# Patient Record
Sex: Male | Born: 1960 | Race: Black or African American | Hispanic: No | Marital: Married | Smoking: Never smoker
Health system: Southern US, Community
[De-identification: ages and names within clinical notes are randomized; demographics above are authoritative.]

## PROBLEM LIST (undated history)

## (undated) DIAGNOSIS — J45909 Unspecified asthma, uncomplicated: Secondary | ICD-10-CM

## (undated) DIAGNOSIS — J189 Pneumonia, unspecified organism: Secondary | ICD-10-CM

## (undated) DIAGNOSIS — Z87442 Personal history of urinary calculi: Secondary | ICD-10-CM

## (undated) DIAGNOSIS — IMO0001 Reserved for inherently not codable concepts without codable children: Secondary | ICD-10-CM

## (undated) DIAGNOSIS — Z974 Presence of external hearing-aid: Secondary | ICD-10-CM

## (undated) DIAGNOSIS — R683 Clubbing of fingers: Secondary | ICD-10-CM

## (undated) DIAGNOSIS — R21 Rash and other nonspecific skin eruption: Secondary | ICD-10-CM

## (undated) DIAGNOSIS — R030 Elevated blood-pressure reading, without diagnosis of hypertension: Secondary | ICD-10-CM

## (undated) DIAGNOSIS — K219 Gastro-esophageal reflux disease without esophagitis: Secondary | ICD-10-CM

## (undated) DIAGNOSIS — M1990 Unspecified osteoarthritis, unspecified site: Secondary | ICD-10-CM

## (undated) DIAGNOSIS — H9192 Unspecified hearing loss, left ear: Secondary | ICD-10-CM

## (undated) DIAGNOSIS — Z5111 Encounter for antineoplastic chemotherapy: Secondary | ICD-10-CM

## (undated) DIAGNOSIS — E559 Vitamin D deficiency, unspecified: Secondary | ICD-10-CM

## (undated) DIAGNOSIS — I10 Essential (primary) hypertension: Secondary | ICD-10-CM

## (undated) DIAGNOSIS — C7989 Secondary malignant neoplasm of other specified sites: Secondary | ICD-10-CM

## (undated) DIAGNOSIS — D649 Anemia, unspecified: Secondary | ICD-10-CM

## (undated) DIAGNOSIS — E162 Hypoglycemia, unspecified: Secondary | ICD-10-CM

## (undated) HISTORY — DX: Vitamin D deficiency, unspecified: E55.9

## (undated) HISTORY — DX: Unspecified hearing loss, left ear: H91.92

## (undated) HISTORY — PX: TONSILLECTOMY: SUR1361

## (undated) HISTORY — DX: Gastro-esophageal reflux disease without esophagitis: K21.9

---
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
## 2006-11-24 ENCOUNTER — Emergency Department (HOSPITAL_COMMUNITY): Admission: EM | Admit: 2006-11-24 | Discharge: 2006-11-24 | Payer: Self-pay | Admitting: Emergency Medicine

## 2011-04-03 LAB — POCT I-STAT CREATININE: Operator id: 270651

## 2011-04-03 LAB — I-STAT 8, (EC8 V) (CONVERTED LAB)
Acid-Base Excess: 1
Bicarbonate: 24.4 — ABNORMAL HIGH
Chloride: 107
Operator id: 270651
pCO2, Ven: 34.2 — ABNORMAL LOW
pH, Ven: 7.461 — ABNORMAL HIGH

## 2011-04-03 LAB — CULTURE, BLOOD (ROUTINE X 2)
Culture: NO GROWTH
Culture: NO GROWTH

## 2011-04-03 LAB — URINALYSIS, ROUTINE W REFLEX MICROSCOPIC
Glucose, UA: NEGATIVE
Hgb urine dipstick: NEGATIVE
Protein, ur: NEGATIVE
pH: 5.5

## 2011-04-03 LAB — CBC
Platelets: 116 — ABNORMAL LOW
RDW: 13.2
WBC: 10.1

## 2011-04-03 LAB — DIFFERENTIAL
Basophils Absolute: 0
Eosinophils Relative: 1
Lymphocytes Relative: 7 — ABNORMAL LOW
Lymphs Abs: 0.7
Neutro Abs: 8.7 — ABNORMAL HIGH
Neutrophils Relative %: 86 — ABNORMAL HIGH

## 2015-04-25 ENCOUNTER — Encounter: Payer: Self-pay | Admitting: Cardiology

## 2015-11-02 ENCOUNTER — Other Ambulatory Visit: Payer: Self-pay

## 2015-11-02 ENCOUNTER — Other Ambulatory Visit: Payer: Self-pay | Admitting: Physician Assistant

## 2015-11-02 DIAGNOSIS — R222 Localized swelling, mass and lump, trunk: Secondary | ICD-10-CM

## 2015-11-06 ENCOUNTER — Ambulatory Visit
Admission: RE | Admit: 2015-11-06 | Discharge: 2015-11-06 | Disposition: A | Discharge status: Home / Self Care | Payer: BLUE CROSS/BLUE SHIELD | Source: Ambulatory Visit | Attending: Physician Assistant | Admitting: Physician Assistant

## 2015-11-06 DIAGNOSIS — R222 Localized swelling, mass and lump, trunk: Secondary | ICD-10-CM

## 2015-11-06 MED ORDER — IOPAMIDOL (ISOVUE-300) INJECTION 61%
75.0000 mL | Freq: Once | INTRAVENOUS | Status: AC | PRN
  Administered 2015-11-06: 75 mL via INTRAVENOUS

## 2015-11-07 ENCOUNTER — Other Ambulatory Visit (HOSPITAL_COMMUNITY): Payer: Self-pay | Admitting: Internal Medicine

## 2015-11-07 DIAGNOSIS — R918 Other nonspecific abnormal finding of lung field: Secondary | ICD-10-CM

## 2015-11-14 ENCOUNTER — Other Ambulatory Visit: Payer: Self-pay | Admitting: General Surgery

## 2015-11-15 ENCOUNTER — Ambulatory Visit (HOSPITAL_COMMUNITY)
Admission: RE | Admit: 2015-11-15 | Discharge: 2015-11-15 | Disposition: A | Discharge status: Home / Self Care | Payer: BLUE CROSS/BLUE SHIELD | Source: Ambulatory Visit | Attending: Internal Medicine | Admitting: Internal Medicine

## 2015-11-15 ENCOUNTER — Encounter (HOSPITAL_COMMUNITY): Payer: Self-pay

## 2015-11-15 DIAGNOSIS — R918 Other nonspecific abnormal finding of lung field: Secondary | ICD-10-CM | POA: Diagnosis present

## 2015-11-15 DIAGNOSIS — J850 Gangrene and necrosis of lung: Secondary | ICD-10-CM | POA: Diagnosis not present

## 2015-11-15 DIAGNOSIS — C499 Malignant neoplasm of connective and soft tissue, unspecified: Secondary | ICD-10-CM

## 2015-11-15 DIAGNOSIS — Z9889 Other specified postprocedural states: Secondary | ICD-10-CM | POA: Diagnosis not present

## 2015-11-15 DIAGNOSIS — R683 Clubbing of fingers: Secondary | ICD-10-CM | POA: Insufficient documentation

## 2015-11-15 HISTORY — DX: Clubbing of fingers: R68.3

## 2015-11-15 HISTORY — DX: Malignant neoplasm of connective and soft tissue, unspecified: C49.9

## 2015-11-15 LAB — CBC
HCT: 39.3 % (ref 39.0–52.0)
Hemoglobin: 12.1 g/dL — ABNORMAL LOW (ref 13.0–17.0)
MCH: 28.1 pg (ref 26.0–34.0)
MCHC: 30.8 g/dL (ref 30.0–36.0)
MCV: 91.2 fL (ref 78.0–100.0)
PLATELETS: 223 10*3/uL (ref 150–400)
RBC: 4.31 MIL/uL (ref 4.22–5.81)
RDW: 14.4 % (ref 11.5–15.5)
WBC: 8.3 10*3/uL (ref 4.0–10.5)

## 2015-11-15 LAB — PROTIME-INR
INR: 1.27 (ref 0.00–1.49)
PROTHROMBIN TIME: 16 s — AB (ref 11.6–15.2)

## 2015-11-15 LAB — APTT: aPTT: 41 seconds — ABNORMAL HIGH (ref 24–37)

## 2015-11-15 MED ORDER — FENTANYL CITRATE (PF) 100 MCG/2ML IJ SOLN
INTRAMUSCULAR | Status: AC | PRN
  Administered 2015-11-15: 100 ug via INTRAVENOUS

## 2015-11-15 MED ORDER — MIDAZOLAM HCL 2 MG/2ML IJ SOLN
INTRAMUSCULAR | Status: AC | PRN
  Administered 2015-11-15: 2 mg via INTRAVENOUS

## 2015-11-15 MED ORDER — MIDAZOLAM HCL 2 MG/2ML IJ SOLN
INTRAMUSCULAR | Status: AC
  Filled 2015-11-15: qty 2

## 2015-11-15 MED ORDER — LIDOCAINE HCL 1 % IJ SOLN
INTRAMUSCULAR | Status: AC
  Filled 2015-11-15: qty 20

## 2015-11-15 MED ORDER — FENTANYL CITRATE (PF) 100 MCG/2ML IJ SOLN
INTRAMUSCULAR | Status: AC
  Filled 2015-11-15: qty 2

## 2015-11-15 MED ORDER — SODIUM CHLORIDE 0.9 % IV SOLN
INTRAVENOUS | Status: DC
  Administered 2015-11-15: 10:00:00 via INTRAVENOUS

## 2015-11-15 NOTE — Discharge Instructions (Signed)

## 2015-11-15 NOTE — Sedation Documentation (Signed)
Patient denies pain and is resting comfortably.  

## 2015-11-15 NOTE — Procedures (Signed)
CT guided core biopsies of right lung mass.  3 cores placed in formalin and 1 in saline.  Minimal bleeding.  No immediate complication.

## 2015-11-15 NOTE — H&P (Signed)
Chief Complaint: Patient was seen in consultation today for right lung mass at the request of Shelton,Kimberly  Referring Physician(s): Shelton,Kimberly  Supervising Physician: Dr Markus Daft  Patient Status: Out-pt  History of Present Illness: Mathew [name] is a 55 y.o. male   Pt seen by PMD for aches and pains in joints Sent to Rheumatologist Noted nail clubbing and sent for CXR Noted abnormal CXR--no imaging available to me CT Chest 11/06/15: IMPRESSION: 1. Large enhancing soft tissue pleural-based mass posteriorly in the right lower lobe involving the superior segment with extension into the mediastinum. This would be amenable to CT guided core needle biopsy. Associated small pleural effusion on the right. 2. Indeterminate 4 mm pulmonary nodule left lung base. 3. 17 mm right renal lesion likely a cyst. Consider renal ultrasound to confirm.  Now scheduled for right lung mass biopsy  Past Medical History  Diagnosis Date  . Clubbing of nails     History reviewed. No pertinent past surgical history.  Allergies: Review of patient's allergies indicates not on file.  Medications: Prior to Admission medications   Medication Sig Start Date End Date Taking? Authorizing Provider  meloxicam (MOBIC) 15 MG tablet Take 15 mg by mouth daily. 10/29/15   Historical Provider, MD     Family History  Problem Relation Age of Onset  . Diabetes type II Mother   . Hypertension Father     Social History   Social History  . Marital Status: Married    Spouse Name: N/A  . Number of Children: N/A  . Years of Education: N/A   Social History Main Topics  . Smoking status: Never Smoker   . Smokeless tobacco: None  . Alcohol Use: None  . Drug Use: None  . Sexual Activity: Not Asked   Other Topics Concern  . None   Social History Narrative  . None    Review of Systems: A 12 point ROS discussed and pertinent positives are indicated in the HPI above.  All other systems  are negative.  Review of Systems  Constitutional: Negative for fever, activity change, appetite change, fatigue and unexpected weight change.  Respiratory: Negative for cough and shortness of breath.   Cardiovascular: Negative for chest pain.  Gastrointestinal: Negative for abdominal pain.  Musculoskeletal: Negative for back pain.  Neurological: Negative for weakness.  Psychiatric/Behavioral: Negative for behavioral problems and confusion.    Vital Signs: BP 145/93 mmHg  Pulse 64  Temp(Src) 97.4 F (36.3 C)  Resp 18  Ht '5\' 8"'$  (1.727 m)  Wt 181 lb (82.101 kg)  BMI 27.53 kg/m2  SpO2 100%  Physical Exam  Constitutional: He is oriented to person, place, and time.  Cardiovascular: Normal rate, regular rhythm and normal heart sounds.   No murmur heard. Pulmonary/Chest: Effort normal and breath sounds normal. He has no wheezes.  Abdominal: Soft. He exhibits no distension.  Musculoskeletal: Normal range of motion.  Neurological: He is alert and oriented to person, place, and time.  Skin: Skin is warm and dry.  Nail clubbing identified  Psychiatric: He has a normal mood and affect. His behavior is normal. Judgment and thought content normal.  Nursing note and vitals reviewed.   Mallampati Score:  MD Evaluation Airway: WNL Heart: WNL Abdomen: WNL Chest/ Lungs: WNL ASA  Classification: 2 Mallampati/Airway Score: One  Imaging: Ct Chest W Contrast  11/06/2015  CLINICAL DATA:  Abnormal chest x-ray by report, not available for review, concern for mass EXAM: CT CHEST WITH CONTRAST TECHNIQUE:  Multidetector CT imaging of the chest was performed during intravenous contrast administration. CONTRAST:  49m ISOVUE-300 IOPAMIDOL (ISOVUE-300) INJECTION 61% COMPARISON:  11/24/2006 FINDINGS: On image number 112 in the lateral left lung base there is a 4 mm pulmonary There is a large pleural-based mass posteriorly in the mid to superior aspect of the right lower lobe. The mass shows  heterogeneous attenuation an faint heterogeneous enhancement with a focus of either more prominent vascular enhancement or calcification along its inferomedial border. New the mass is inseparable from the pleura but there is no evidence of underlying rib destruction. There is a small right pleural effusion inferiorly. The mass extends from the lateral pleural surface across the right lower lobe and into the as ago esophageal recess were it is inseparable from the mediastinum. There is mild anterior mass-effect upon the right main bronchus and segments of the lower lobe airways posteriorly. There is also mild mass effect on the esophagus. There is no significant hilar or mediastinal adenopathy appreciated. Heart is normal with no pericardial effusion. Thoracic aorta shows no dissection or dilatation. Nodule. The left lung is otherwise clear. No significant axillary adenopathy. No acute musculoskeletal findings. Images through the upper abdomen demonstrate no acute abnormalities. There is a low-attenuation round lesion measuring 17 mm in the medial midpole of the right kidney, with average attenuation of 16. A 3 mm low-attenuation upper pole right renal lesion is noted too small to characterize likely a cyst. IMPRESSION: 1. Large enhancing soft tissue pleural-based mass posteriorly in the right lower lobe involving the superior segment with extension into the mediastinum. This would be amenable to CT guided core needle biopsy. Associated small pleural effusion on the right. 2. Indeterminate 4 mm pulmonary nodule left lung base. 3. 17 mm right renal lesion likely a cyst. Consider renal ultrasound to confirm. Electronically Signed   By: RSkipper ClicheM.D.   On: 11/06/2015 11:41    Labs:  CBC:  Recent Labs  11/15/15 1000  WBC 8.3  HGB 12.1*  HCT 39.3  PLT 223    COAGS: No results for input(s): INR, APTT in the last 8760 hours.  BMP: No results for input(s): NA, K, CL, CO2, GLUCOSE, BUN, CALCIUM,  CREATININE, GFRNONAA, GFRAA in the last 8760 hours.  Invalid input(s): CMP  LIVER FUNCTION TESTS: No results for input(s): BILITOT, AST, ALT, ALKPHOS, PROT, ALBUMIN in the last 8760 hours.  TUMOR MARKERS: No results for input(s): AFPTM, CEA, CA199, CHROMGRNA in the last 8760 hours.  Assessment and Plan:  Rt lung mass Scheduled for biopsy Risks and Benefits discussed with the patient including, but not limited to bleeding, hemoptysis, respiratory failure requiring intubation, infection, pneumothorax requiring chest tube placement, stroke from air embolism or even death. All of the patient's questions were answered, patient is agreeable to proceed. Consent signed and in chart.   Thank you for this interesting consult.  I greatly enjoyed meeting Mathew LEVERETTEand look forward to participating in their care.  A copy of this report was sent to the requesting provider on this date.  Electronically Signed: Yakub Lodes A 11/15/2015, 10:30 AM   I spent a total of  30 Minutes   in face to face in clinical consultation, greater than 50% of which was counseling/coordinating care for right lung mass bx

## 2015-11-29 ENCOUNTER — Other Ambulatory Visit (HOSPITAL_COMMUNITY): Payer: Self-pay | Admitting: Internal Medicine

## 2015-11-29 DIAGNOSIS — R918 Other nonspecific abnormal finding of lung field: Secondary | ICD-10-CM

## 2015-12-06 ENCOUNTER — Ambulatory Visit (HOSPITAL_COMMUNITY)
Admission: RE | Admit: 2015-12-06 | Discharge: 2015-12-06 | Disposition: A | Discharge status: Home / Self Care | Payer: BLUE CROSS/BLUE SHIELD | Source: Ambulatory Visit | Attending: Internal Medicine | Admitting: Internal Medicine

## 2015-12-06 DIAGNOSIS — J850 Gangrene and necrosis of lung: Secondary | ICD-10-CM | POA: Insufficient documentation

## 2015-12-06 DIAGNOSIS — R918 Other nonspecific abnormal finding of lung field: Secondary | ICD-10-CM | POA: Insufficient documentation

## 2015-12-06 LAB — GLUCOSE, CAPILLARY: GLUCOSE-CAPILLARY: 93 mg/dL (ref 65–99)

## 2015-12-06 MED ORDER — FLUDEOXYGLUCOSE F - 18 (FDG) INJECTION
9.5800 | Freq: Once | INTRAVENOUS | Status: AC | PRN
  Administered 2015-12-06: 9.58 via INTRAVENOUS

## 2015-12-11 ENCOUNTER — Institutional Professional Consult (permissible substitution) (INDEPENDENT_AMBULATORY_CARE_PROVIDER_SITE_OTHER): Payer: BLUE CROSS/BLUE SHIELD | Admitting: Thoracic Surgery (Cardiothoracic Vascular Surgery)

## 2015-12-11 ENCOUNTER — Encounter: Payer: Self-pay | Admitting: Thoracic Surgery (Cardiothoracic Vascular Surgery)

## 2015-12-11 VITALS — BP 158/84 | HR 56 | Resp 16 | Ht 68.0 in | Wt 180.0 lb

## 2015-12-11 DIAGNOSIS — J984 Other disorders of lung: Secondary | ICD-10-CM | POA: Diagnosis not present

## 2015-12-11 DIAGNOSIS — R918 Other nonspecific abnormal finding of lung field: Secondary | ICD-10-CM

## 2015-12-11 NOTE — Progress Notes (Addendum)
PCP is No primary care provider on file. Referring Provider is [name], MD  Chief Complaint  Patient presents with  . Lung Mass    Surgical eval, PET Scan 12/06/15, CT BX 11/15/15, Chest CT 11/06/15  . Lung Lesion    HPI: Mr. Villavicencio is a 55 year old man who is sent for consultation regarding right lower lobe mass.  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 He was in his usual state of health until January when he felt ill. He initially complained of decreased energy and some wheezing with exertion. He never had much of a cough and denies any hemoptysis. He thought he had a virus. He developed some other symptoms including swelling in his knees and ankles and was referred to rheumatology. The rheumatologist noticed some clubbing of his nails and did a chest x-ray. That showed a right lung mass. That led to a CT which showed a 12 cm mass in the right lower lobe. A CT-guided biopsy showed mostly necrosis. There were a few spindle cells noted but they were not particularly atypical.  A PET CT was done more recently which showed low-grade uptake with central necrosis. There was no hilar or mediastinal adenopathy.  As noted he is a lifelong nonsmoker. 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 He has noted some wheezing with extreme exertion but does not have a with any type of routine activities. He had lost about 6 pounds when he first got sick 6 months ago, but has gained that back.   Zubrod Score: At the time of surgery this patient's most appropriate activity status/level should be described as: '[x]'$     0    Normal activity, no symptoms '[]'$     1    Restricted in physical strenuous activity but ambulatory, able to do out light work '[]'$     2    Ambulatory and capable of self care, unable to do work activities, up and about >50 % of waking hours                              '[]'$     3    Only limited self care, in bed greater  than 50% of waking hours '[]'$     4    Completely disabled, no self care, confined to bed or chair '[]'$     5    Moribund    Past Medical History  Diagnosis Date  . Clubbing of nails   . Vitamin D deficiency   . Hyperlipidemia   . GERD (gastroesophageal reflux disease)   . Deafness in left ear     No past surgical history on file.  Family History  Problem Relation Age of Onset  . Diabetes type II Mother   . Hypertension Father     Social History Social History  Substance Use Topics  . Smoking status: Never Smoker   . Smokeless tobacco: Never Used  . Alcohol Use: 0.0 oz/week    0 Standard drinks or equivalent per week    No current outpatient prescriptions on file.   No current facility-administered medications for this visit.    Allergies  Allergen Reactions  . Aspirin Shortness Of Breath  . Penicillins Rash    low    Review of Systems  Constitutional: Positive for fatigue. Negative for activity change, appetite change and unexpected weight change (Lost 6 pounds but regained).  HENT:  Positive for trouble swallowing (Dates back several years associated with reflux). Negative for voice change.   Eyes: Positive for visual disturbance (Blurry).  Respiratory: Positive for wheezing (With exertion). Negative for shortness of breath.   Cardiovascular: Positive for leg swelling. Negative for chest pain.  Gastrointestinal: Negative for blood in stool.       Reflux  Genitourinary: Negative for hematuria and difficulty urinating.  Musculoskeletal: Positive for joint swelling and arthralgias.  Neurological: Negative for dizziness, syncope, speech difficulty and headaches.  Hematological: Negative for adenopathy. Does not bruise/bleed easily.  Psychiatric/Behavioral: Negative for dysphoric mood. The patient is not nervous/anxious.   All other systems reviewed and are negative.   BP 158/84 mmHg  Pulse 56  Resp 16  Ht '5\' 8"'$  (1.727 m)  Wt 180 lb (81.647 kg)  BMI 27.38 kg/m2   SpO2 98% Physical Exam  Constitutional: He is oriented to person, place, and time. He appears well-developed and well-nourished. No distress.  HENT:  Head: Normocephalic and atraumatic.  Eyes: Conjunctivae and EOM are normal. Pupils are equal, round, and reactive to light. No scleral icterus.  Neck: Normal range of motion. Neck supple. No thyromegaly present.  Cardiovascular: Normal rate, regular rhythm, normal heart sounds and intact distal pulses.  Exam reveals no gallop and no friction rub.   No murmur heard. Pulmonary/Chest: Effort normal. He has no wheezes. He has no rales.  Decreased breath sounds right midlung  Abdominal: Soft. Bowel sounds are normal. He exhibits no distension. There is no tenderness.  Musculoskeletal: Normal range of motion. He exhibits no edema.  Lymphadenopathy:    He has no cervical adenopathy.  Neurological: He is alert and oriented to person, place, and time. No cranial nerve deficit. He exhibits normal muscle tone. Coordination normal.  Skin: Skin is warm and dry.  Mild clubbing of nails  Vitals reviewed.    Diagnostic Tests: CT CHEST WITH CONTRAST  TECHNIQUE: Multidetector CT imaging of the chest was performed during intravenous contrast administration.  CONTRAST: 30m ISOVUE-300 IOPAMIDOL (ISOVUE-300) INJECTION 61%  COMPARISON: 11/24/2006  FINDINGS: On image number 112 in the lateral left lung base there is a 4 mm pulmonary  There is a large pleural-based mass posteriorly in the mid to superior aspect of the right lower lobe. The mass shows heterogeneous attenuation an faint heterogeneous enhancement with a focus of either more prominent vascular enhancement or calcification along its inferomedial border. New the mass is inseparable from the pleura but there is no evidence of underlying rib destruction. There is a small right pleural effusion inferiorly.  The mass extends from the lateral pleural surface across the right lower  lobe and into the as ago esophageal recess were it is inseparable from the mediastinum. There is mild anterior mass-effect upon the right main bronchus and segments of the lower lobe airways posteriorly. There is also mild mass effect on the esophagus.  There is no significant hilar or mediastinal adenopathy appreciated. Heart is normal with no pericardial effusion. Thoracic aorta shows no dissection or dilatation. Nodule. The left lung is otherwise clear. No significant axillary adenopathy. No acute musculoskeletal findings. Images through the upper abdomen demonstrate no acute abnormalities. There is a low-attenuation round lesion measuring 17 mm in the medial midpole of the right kidney, with average attenuation of 16. A 3 mm low-attenuation upper pole right renal lesion is noted too small to characterize likely a cyst.  IMPRESSION: 1. Large enhancing soft tissue pleural-based mass posteriorly in the right lower lobe involving the superior  segment with extension into the mediastinum. This would be amenable to CT guided core needle biopsy. Associated small pleural effusion on the right. 2. Indeterminate 4 mm pulmonary nodule left lung base. 3. 17 mm right renal lesion likely a cyst. Consider renal ultrasound to confirm.   Electronically Signed  By: Skipper Cliche M.D.  On: 11/06/2015 11:41   NUCLEAR MEDICINE PET SKULL BASE TO THIGH  TECHNIQUE: 9.6 mCi F-18 FDG was injected intravenously. Full-ring PET imaging was performed from the skull base to thigh after the radiotracer. CT data was obtained and used for attenuation correction and anatomic localization.  FASTING BLOOD GLUCOSE: Value: 93 mg/dl  COMPARISON: Chest CT on 11/06/2015  FINDINGS: NECK  No hypermetabolic lymph nodes in the neck.  CHEST  No hypermetabolic mediastinal or hilar nodes. Large mass in the superior right lower lobe which abuts the mediastinum and posterior chest wall measures  approximately 12 cm in maximum diameter. This shows hypermetabolic activity with central necrosis, with SUV max of 3.2. While this extends into the right hilum common no separate hypermetabolic hilar or mediastinal lymph nodes are identified. No other hypermetabolic lymph nodes identified within the thorax. Tiny 4 mm subpleural pulmonary nodule in the left lung base on image 61 shows no metabolic activity but is too small characterize by PET.  ABDOMEN/PELVIS  No abnormal hypermetabolic activity within the liver, pancreas, adrenal glands, or spleen. No hypermetabolic lymph nodes in the abdomen or pelvis.  SKELETON  No focal hypermetabolic activity to suggest skeletal metastasis.  IMPRESSION: 12 cm right lower lobe mass shows central necrosis and low-grade metabolic activity, consistent with low-grade bronchogenic carcinoma.  No evidence of thoracic nodal or distant metastatic disease.  4 mm left lower lobe pulmonary nodule abutting the left hemidiaphragm is too small to characterize by PET. Recommend continued attention on imaging followup.   Electronically Signed  By: Earle Gell M.D.  On: 12/06/2015 15:06  I personally reviewed the CT and PET/CT. There is a 12 cm right lower lobe mass that is highly suspicious for an adenocarcinoma with lepidic spread.  Impression: 55 year old nonsmoker who has a large right lower lobe mass. This has low-grade metabolic activity on PET CT and extensive central necrosis. Unfortunately the CT-guided biopsy was taken from an area that was primarily necrosis. 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 Those can often occupying entire lobes without regional or distant metastases. It is interesting that there were some spindle cells so this could be some type of very low-grade sarcoma or spindle cell tumor. In any event we need to know exactly what this mass is so that we can treated  appropriately.   My recommendation was that we do a bronchoscopy and endobronchial ultrasound to further evaluate the mass. He would need a bronchoscopy regardless. If that is still nondiagnostic we could try another CT-guided biopsy aiming for an area where there is more metabolic activity.  I described the proposed procedure to Mr. Augello and his wife. They understand this will be done in the operating room under general anesthesia. There would be no incisions, all were could be done via endotracheal tube. We would plan to do this on an outpatient basis. We discussed the indications, risks, benefits, and alternatives. They understand the risks include, but are not limited to death, MI, stroke, DVT, PE (all of which I think are exceedingly small risks), bleeding, pneumothorax, and failure to make a definitive diagnosis. They understand this is diagnostic and not therapeutic.  He will also need an MR of the brain and pulmonary function testing. We will go ahead and arrange those, the order is not important.   Plan: MR brain  Pulmonary function testing  Bronchoscopy and endobronchial ultrasound on Monday, 12/17/2015  [name], MD [facility] [phone]

## 2015-12-12 ENCOUNTER — Other Ambulatory Visit: Payer: Self-pay | Admitting: *Deleted

## 2015-12-12 DIAGNOSIS — R918 Other nonspecific abnormal finding of lung field: Secondary | ICD-10-CM

## 2015-12-12 DIAGNOSIS — C7931 Secondary malignant neoplasm of brain: Secondary | ICD-10-CM

## 2015-12-13 ENCOUNTER — Other Ambulatory Visit: Payer: Self-pay | Admitting: Thoracic Surgery (Cardiothoracic Vascular Surgery)

## 2015-12-13 ENCOUNTER — Ambulatory Visit
Admission: RE | Admit: 2015-12-13 | Discharge: 2015-12-13 | Disposition: A | Discharge status: Home / Self Care | Payer: BLUE CROSS/BLUE SHIELD | Source: Ambulatory Visit | Attending: Thoracic Surgery (Cardiothoracic Vascular Surgery) | Admitting: Thoracic Surgery (Cardiothoracic Vascular Surgery)

## 2015-12-13 DIAGNOSIS — R918 Other nonspecific abnormal finding of lung field: Secondary | ICD-10-CM

## 2015-12-13 DIAGNOSIS — C7931 Secondary malignant neoplasm of brain: Secondary | ICD-10-CM

## 2015-12-14 NOTE — Progress Notes (Signed)
I was unable to reach patient by phone.  I left  A message on voice mail.  I instructed the patient to arrive at [place] entrance at 1130 , nothing to eat or drink after midnight. No medications I asked patient to not wear any lotions, powders, cologne, jewelry, piercing, make-up or nail polish.  I asked the patient to call [phone], in the am if there were any questions or problems.

## 2015-12-15 ENCOUNTER — Ambulatory Visit
Admission: RE | Admit: 2015-12-15 | Discharge: 2015-12-15 | Disposition: A | Discharge status: Home / Self Care | Payer: BLUE CROSS/BLUE SHIELD | Source: Ambulatory Visit | Attending: Thoracic Surgery (Cardiothoracic Vascular Surgery) | Admitting: Thoracic Surgery (Cardiothoracic Vascular Surgery)

## 2015-12-15 DIAGNOSIS — C7931 Secondary malignant neoplasm of brain: Secondary | ICD-10-CM

## 2015-12-15 DIAGNOSIS — R918 Other nonspecific abnormal finding of lung field: Secondary | ICD-10-CM

## 2015-12-15 MED ORDER — GADOBENATE DIMEGLUMINE 529 MG/ML IV SOLN
17.0000 mL | Freq: Once | INTRAVENOUS | Status: AC | PRN
  Administered 2015-12-15: 17 mL via INTRAVENOUS

## 2015-12-17 ENCOUNTER — Ambulatory Visit (HOSPITAL_COMMUNITY): Payer: BLUE CROSS/BLUE SHIELD | Admitting: Anesthesiology

## 2015-12-17 ENCOUNTER — Encounter (HOSPITAL_COMMUNITY)
Admission: RE | Disposition: A | Discharge status: Home / Self Care | Payer: Self-pay | Source: Ambulatory Visit | Attending: Thoracic Surgery (Cardiothoracic Vascular Surgery)

## 2015-12-17 ENCOUNTER — Ambulatory Visit (HOSPITAL_COMMUNITY)
Admission: RE | Admit: 2015-12-17 | Discharge: 2015-12-17 | Disposition: A | Discharge status: Home / Self Care | Payer: BLUE CROSS/BLUE SHIELD | Source: Ambulatory Visit | Attending: Thoracic Surgery (Cardiothoracic Vascular Surgery) | Admitting: Thoracic Surgery (Cardiothoracic Vascular Surgery)

## 2015-12-17 ENCOUNTER — Ambulatory Visit (HOSPITAL_COMMUNITY): Payer: BLUE CROSS/BLUE SHIELD

## 2015-12-17 ENCOUNTER — Encounter (HOSPITAL_COMMUNITY): Payer: Self-pay | Admitting: *Deleted

## 2015-12-17 DIAGNOSIS — K219 Gastro-esophageal reflux disease without esophagitis: Secondary | ICD-10-CM | POA: Insufficient documentation

## 2015-12-17 DIAGNOSIS — Z886 Allergy status to analgesic agent status: Secondary | ICD-10-CM | POA: Diagnosis not present

## 2015-12-17 DIAGNOSIS — R222 Localized swelling, mass and lump, trunk: Secondary | ICD-10-CM

## 2015-12-17 DIAGNOSIS — Z880 Allergy status to penicillin: Secondary | ICD-10-CM | POA: Diagnosis not present

## 2015-12-17 DIAGNOSIS — R918 Other nonspecific abnormal finding of lung field: Secondary | ICD-10-CM

## 2015-12-17 DIAGNOSIS — E785 Hyperlipidemia, unspecified: Secondary | ICD-10-CM | POA: Insufficient documentation

## 2015-12-17 HISTORY — DX: Unspecified asthma, uncomplicated: J45.909

## 2015-12-17 HISTORY — PX: VIDEO BRONCHOSCOPY WITH ENDOBRONCHIAL ULTRASOUND: SHX6177

## 2015-12-17 LAB — COMPREHENSIVE METABOLIC PANEL
ALBUMIN: 3.5 g/dL (ref 3.5–5.0)
ALK PHOS: 137 U/L — AB (ref 38–126)
ALT: 15 U/L — AB (ref 17–63)
ANION GAP: 8 (ref 5–15)
AST: 22 U/L (ref 15–41)
BUN: 12 mg/dL (ref 6–20)
CALCIUM: 9 mg/dL (ref 8.9–10.3)
CHLORIDE: 105 mmol/L (ref 101–111)
CO2: 24 mmol/L (ref 22–32)
CREATININE: 1.2 mg/dL (ref 0.61–1.24)
GFR calc Af Amer: >60 mL/min (ref >60)
GFR calc non Af Amer: >60 mL/min (ref >60)
GLUCOSE: 86 mg/dL (ref 65–99)
Potassium: 4 mmol/L (ref 3.5–5.1)
SODIUM: 137 mmol/L (ref 135–145)
Total Bilirubin: 0.8 mg/dL (ref 0.3–1.2)
Total Protein: 6.6 g/dL (ref 6.5–8.1)

## 2015-12-17 LAB — APTT: APTT: 39 s — AB (ref 24–37)

## 2015-12-17 LAB — CBC
HCT: 44.4 % (ref 39.0–52.0)
HEMOGLOBIN: 13.8 g/dL (ref 13.0–17.0)
MCH: 29.5 pg (ref 26.0–34.0)
MCHC: 31.1 g/dL (ref 30.0–36.0)
MCV: 94.9 fL (ref 78.0–100.0)
Platelets: 172 10*3/uL (ref 150–400)
RBC: 4.68 MIL/uL (ref 4.22–5.81)
RDW: 13.8 % (ref 11.5–15.5)
WBC: 6.7 10*3/uL (ref 4.0–10.5)

## 2015-12-17 LAB — PROTIME-INR
INR: 1.19 (ref 0.00–1.49)
Prothrombin Time: 15.3 seconds — ABNORMAL HIGH (ref 11.6–15.2)

## 2015-12-17 SURGERY — BRONCHOSCOPY, WITH EBUS
Anesthesia: General

## 2015-12-17 MED ORDER — PROPOFOL 10 MG/ML IV BOLUS
INTRAVENOUS | Status: AC
  Filled 2015-12-17: qty 40

## 2015-12-17 MED ORDER — LIDOCAINE HCL (CARDIAC) 20 MG/ML IV SOLN
INTRAVENOUS | Status: DC | PRN
  Administered 2015-12-17: 60 mg via INTRAVENOUS

## 2015-12-17 MED ORDER — FENTANYL CITRATE (PF) 250 MCG/5ML IJ SOLN
INTRAMUSCULAR | Status: AC
  Filled 2015-12-17: qty 5

## 2015-12-17 MED ORDER — SUCCINYLCHOLINE CHLORIDE 200 MG/10ML IV SOSY
PREFILLED_SYRINGE | INTRAVENOUS | Status: AC
  Filled 2015-12-17: qty 10

## 2015-12-17 MED ORDER — ONDANSETRON HCL 4 MG/2ML IJ SOLN
INTRAMUSCULAR | Status: DC | PRN
  Administered 2015-12-17: 4 mg via INTRAVENOUS

## 2015-12-17 MED ORDER — ACETAMINOPHEN 160 MG/5ML PO SOLN
325.0000 mg | ORAL | Status: DC | PRN
  Filled 2015-12-17: qty 20.3

## 2015-12-17 MED ORDER — OXYCODONE HCL 5 MG PO TABS: 5.0000 mg | ORAL_TABLET | Freq: Once | ORAL | Status: DC | PRN

## 2015-12-17 MED ORDER — EPINEPHRINE HCL 1 MG/ML IJ SOLN
INTRAMUSCULAR | Status: DC | PRN
  Administered 2015-12-17: 1 mg via ENDOTRACHEOPULMONARY

## 2015-12-17 MED ORDER — LIDOCAINE 2% (20 MG/ML) 5 ML SYRINGE
INTRAMUSCULAR | Status: AC
  Filled 2015-12-17: qty 5

## 2015-12-17 MED ORDER — PHENYLEPHRINE HCL 10 MG/ML IJ SOLN
INTRAMUSCULAR | Status: DC | PRN
  Administered 2015-12-17 (×2): 40 ug via INTRAVENOUS
  Administered 2015-12-17: 80 ug via INTRAVENOUS

## 2015-12-17 MED ORDER — ROCURONIUM BROMIDE 100 MG/10ML IV SOLN
INTRAVENOUS | Status: DC | PRN
  Administered 2015-12-17: 20 mg via INTRAVENOUS
  Administered 2015-12-17: 50 mg via INTRAVENOUS
  Administered 2015-12-17: 20 mg via INTRAVENOUS
  Administered 2015-12-17: 10 mg via INTRAVENOUS

## 2015-12-17 MED ORDER — SUGAMMADEX SODIUM 200 MG/2ML IV SOLN
INTRAVENOUS | Status: DC | PRN
  Administered 2015-12-17: 120 mg via INTRAVENOUS

## 2015-12-17 MED ORDER — FENTANYL CITRATE (PF) 100 MCG/2ML IJ SOLN: 25.0000 ug | INTRAMUSCULAR | Status: DC | PRN

## 2015-12-17 MED ORDER — DEXAMETHASONE SODIUM PHOSPHATE 10 MG/ML IJ SOLN
INTRAMUSCULAR | Status: DC | PRN
  Administered 2015-12-17: 10 mg via INTRAVENOUS

## 2015-12-17 MED ORDER — 0.9 % SODIUM CHLORIDE (POUR BTL) OPTIME
TOPICAL | Status: DC | PRN
  Administered 2015-12-17: 1000 mL

## 2015-12-17 MED ORDER — EPHEDRINE SULFATE 50 MG/ML IJ SOLN
INTRAMUSCULAR | Status: DC | PRN
  Administered 2015-12-17: 5 mg via INTRAVENOUS
  Administered 2015-12-17 (×2): 10 mg via INTRAVENOUS
  Administered 2015-12-17: 5 mg via INTRAVENOUS
  Administered 2015-12-17 (×2): 10 mg via INTRAVENOUS

## 2015-12-17 MED ORDER — OXYCODONE HCL 5 MG/5ML PO SOLN: 5.0000 mg | Freq: Once | ORAL | Status: DC | PRN

## 2015-12-17 MED ORDER — PROPOFOL 10 MG/ML IV BOLUS
INTRAVENOUS | Status: DC | PRN
  Administered 2015-12-17: 200 mg via INTRAVENOUS

## 2015-12-17 MED ORDER — FENTANYL CITRATE (PF) 100 MCG/2ML IJ SOLN
INTRAMUSCULAR | Status: DC | PRN
  Administered 2015-12-17: 150 ug via INTRAVENOUS

## 2015-12-17 MED ORDER — MIDAZOLAM HCL 5 MG/5ML IJ SOLN
INTRAMUSCULAR | Status: DC | PRN
  Administered 2015-12-17: 2 mg via INTRAVENOUS

## 2015-12-17 MED ORDER — ROCURONIUM BROMIDE 50 MG/5ML IV SOLN
INTRAVENOUS | Status: AC
  Filled 2015-12-17: qty 1

## 2015-12-17 MED ORDER — EPINEPHRINE HCL 1 MG/ML IJ SOLN
INTRAMUSCULAR | Status: AC
  Filled 2015-12-17: qty 1

## 2015-12-17 MED ORDER — ONDANSETRON HCL 4 MG/2ML IJ SOLN
INTRAMUSCULAR | Status: AC
  Filled 2015-12-17: qty 2

## 2015-12-17 MED ORDER — SUGAMMADEX SODIUM 500 MG/5ML IV SOLN
INTRAVENOUS | Status: AC
  Filled 2015-12-17: qty 5

## 2015-12-17 MED ORDER — GLYCOPYRROLATE 0.2 MG/ML IJ SOLN
INTRAMUSCULAR | Status: DC | PRN
  Administered 2015-12-17 (×2): .2 mg via INTRAVENOUS

## 2015-12-17 MED ORDER — ACETAMINOPHEN 325 MG PO TABS: 325.0000 mg | ORAL_TABLET | ORAL | Status: DC | PRN

## 2015-12-17 MED ORDER — MIDAZOLAM HCL 2 MG/2ML IJ SOLN
INTRAMUSCULAR | Status: AC
  Filled 2015-12-17: qty 2

## 2015-12-17 MED ORDER — LACTATED RINGERS IV SOLN
INTRAVENOUS | Status: DC | PRN
  Administered 2015-12-17 (×2): via INTRAVENOUS

## 2015-12-17 MED ORDER — PROPOFOL 10 MG/ML IV BOLUS
INTRAVENOUS | Status: AC
  Filled 2015-12-17: qty 20

## 2015-12-17 SURGICAL SUPPLY — 32 items
BRUSH CYTOL CELLEBRITY 1.5X140 (MISCELLANEOUS) ×1 IMPLANT
CANISTER SUCTION 2500CC (MISCELLANEOUS) ×2 IMPLANT
CONT SPEC 4OZ CLIKSEAL STRL BL (MISCELLANEOUS) ×2 IMPLANT
COVER DOME SNAP 22 D (MISCELLANEOUS) ×2 IMPLANT
COVER TABLE BACK 60X90 (DRAPES) ×2 IMPLANT
FILTER STRAW FLUID ASPIR (MISCELLANEOUS) ×1 IMPLANT
FORCEPS BIOP RJ4 1.8 (CUTTING FORCEPS) ×1 IMPLANT
FORCEPS BIOP SUPERTRX PREMAR (INSTRUMENTS) ×1 IMPLANT
GAUZE SPONGE 4X4 12PLY STRL (GAUZE/BANDAGES/DRESSINGS) ×1 IMPLANT
GLOVE BIOGEL PI IND STRL 6.5 (GLOVE) IMPLANT
GLOVE BIOGEL PI INDICATOR 6.5 (GLOVE) ×1
GLOVE SURG SIGNA 7.5 PF LTX (GLOVE) ×2 IMPLANT
GOWN STRL REUS W/ TWL XL LVL3 (GOWN DISPOSABLE) ×1 IMPLANT
GOWN STRL REUS W/TWL LRG LVL3 (GOWN DISPOSABLE) ×1 IMPLANT
GOWN STRL REUS W/TWL XL LVL3 (GOWN DISPOSABLE) ×2
KIT CLEAN ENDO COMPLIANCE (KITS) ×4 IMPLANT
KIT ROOM TURNOVER OR (KITS) ×2 IMPLANT
MARKER SKIN DUAL TIP RULER LAB (MISCELLANEOUS) ×2 IMPLANT
NDL EBUS SONO TIP PENTAX (NEEDLE) IMPLANT
NEEDLE EBUS SONO TIP PENTAX (NEEDLE) ×2 IMPLANT
NEEDLE SONO TIP II EBUS (NEEDLE) ×2 IMPLANT
NS IRRIG 1000ML POUR BTL (IV SOLUTION) ×2 IMPLANT
OIL SILICONE PENTAX (PARTS (SERVICE/REPAIRS)) ×1 IMPLANT
PAD ARMBOARD 7.5X6 YLW CONV (MISCELLANEOUS) ×4 IMPLANT
SYR 20CC LL (SYRINGE) ×2 IMPLANT
SYR 20ML ECCENTRIC (SYRINGE) ×3 IMPLANT
SYR 5ML LL (SYRINGE) ×2 IMPLANT
SYR 5ML LUER SLIP (SYRINGE) ×2 IMPLANT
TOWEL OR 17X24 6PK STRL BLUE (TOWEL DISPOSABLE) ×2 IMPLANT
TRAP SPECIMEN MUCOUS 40CC (MISCELLANEOUS) ×2 IMPLANT
TUBE CONNECTING 20X1/4 (TUBING) ×2 IMPLANT
WATER STERILE IRR 1000ML POUR (IV SOLUTION) ×2 IMPLANT

## 2015-12-17 NOTE — Anesthesia Procedure Notes (Signed)
Procedure Name: Intubation Date/Time: 12/17/2015 12:43 PM Performed by: Salli Quarry Coreen Shippee Pre-anesthesia Checklist: Patient identified, Emergency Drugs available, Suction available and Patient being monitored Patient Re-evaluated:Patient Re-evaluated prior to inductionOxygen Delivery Method: Circle System Utilized Preoxygenation: Pre-oxygenation with 100% oxygen Intubation Type: IV induction Ventilation: Mask ventilation without difficulty Laryngoscope Size: Mac and 4 Grade View: Grade II Tube type: Oral Tube size: 8.5 mm Number of attempts: 1 Airway Equipment and Method: Stylet Placement Confirmation: ETT inserted through vocal cords under direct vision,  positive ETCO2 and breath sounds checked- equal and bilateral Secured at: 25 cm Tube secured with: Tape Dental Injury: Teeth and Oropharynx as per pre-operative assessment

## 2015-12-17 NOTE — H&P (View-Only) (Signed)
PCP is No primary care provider on file. Referring Provider is [name], MD  Chief Complaint  Patient presents with  . Lung Mass    Surgical eval, PET Scan 12/06/15, CT BX 11/15/15, Chest CT 11/06/15  . Lung Lesion    HPI: Mathew [name] is a 55 year old man who is sent for consultation regarding right lower lobe mass.  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 He was in his usual state of health until January when he felt ill. He initially complained of decreased energy and some wheezing with exertion. He never had much of a cough and denies any hemoptysis. He thought he had a virus. He developed some other symptoms including swelling in his knees and ankles and was referred to rheumatology. The rheumatologist noticed some clubbing of his nails and did a chest x-ray. That showed a right lung mass. That led to a CT which showed a 12 cm mass in the right lower lobe. A CT-guided biopsy showed mostly necrosis. There were a few spindle cells noted but they were not particularly atypical.  A PET CT was done more recently which showed low-grade uptake with central necrosis. There was no hilar or mediastinal adenopathy.  As noted he is a lifelong nonsmoker. 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 He has noted some wheezing with extreme exertion but does not have a with any type of routine activities. He had lost about 6 pounds when he first got sick 6 months ago, but has gained that back.   Zubrod Score: At the time of surgery this patient's most appropriate activity status/level should be described as: '[x]'$     0    Normal activity, no symptoms '[]'$     1    Restricted in physical strenuous activity but ambulatory, able to do out light work '[]'$     2    Ambulatory and capable of self care, unable to do work activities, up and about >50 % of waking hours                              '[]'$     3    Only limited self care, in bed greater  than 50% of waking hours '[]'$     4    Completely disabled, no self care, confined to bed or chair '[]'$     5    Moribund    Past Medical History  Diagnosis Date  . Clubbing of nails   . Vitamin D deficiency   . Hyperlipidemia   . GERD (gastroesophageal reflux disease)   . Deafness in left ear     No past surgical history on file.  Family History  Problem Relation Age of Onset  . Diabetes type II Mother   . Hypertension Father     Social History Social History  Substance Use Topics  . Smoking status: Never Smoker   . Smokeless tobacco: Never Used  . Alcohol Use: 0.0 oz/week    0 Standard drinks or equivalent per week    No current outpatient prescriptions on file.   No current facility-administered medications for this visit.    Allergies  Allergen Reactions  . Aspirin Shortness Of Breath  . Penicillins Rash    low    Review of Systems  Constitutional: Positive for fatigue. Negative for activity change, appetite change and unexpected weight change (Lost 6 pounds but regained).  HENT:  Positive for trouble swallowing (Dates back several years associated with reflux). Negative for voice change.   Eyes: Positive for visual disturbance (Blurry).  Respiratory: Positive for wheezing (With exertion). Negative for shortness of breath.   Cardiovascular: Positive for leg swelling. Negative for chest pain.  Gastrointestinal: Negative for blood in stool.       Reflux  Genitourinary: Negative for hematuria and difficulty urinating.  Musculoskeletal: Positive for joint swelling and arthralgias.  Neurological: Negative for dizziness, syncope, speech difficulty and headaches.  Hematological: Negative for adenopathy. Does not bruise/bleed easily.  Psychiatric/Behavioral: Negative for dysphoric mood. The patient is not nervous/anxious.   All other systems reviewed and are negative.   BP 158/84 mmHg  Pulse 56  Resp 16  Ht '5\' 8"'$  (1.727 m)  Wt 180 lb (81.647 kg)  BMI 27.38 kg/m2   SpO2 98% Physical Exam  Constitutional: He is oriented to person, place, and time. He appears well-developed and well-nourished. No distress.  HENT:  Head: Normocephalic and atraumatic.  Eyes: Conjunctivae and EOM are normal. Pupils are equal, round, and reactive to light. No scleral icterus.  Neck: Normal range of motion. Neck supple. No thyromegaly present.  Cardiovascular: Normal rate, regular rhythm, normal heart sounds and intact distal pulses.  Exam reveals no gallop and no friction rub.   No murmur heard. Pulmonary/Chest: Effort normal. He has no wheezes. He has no rales.  Decreased breath sounds right midlung  Abdominal: Soft. Bowel sounds are normal. He exhibits no distension. There is no tenderness.  Musculoskeletal: Normal range of motion. He exhibits no edema.  Lymphadenopathy:    He has no cervical adenopathy.  Neurological: He is alert and oriented to person, place, and time. No cranial nerve deficit. He exhibits normal muscle tone. Coordination normal.  Skin: Skin is warm and dry.  Mild clubbing of nails  Vitals reviewed.    Diagnostic Tests: CT CHEST WITH CONTRAST  TECHNIQUE: Multidetector CT imaging of the chest was performed during intravenous contrast administration.  CONTRAST: 74m ISOVUE-300 IOPAMIDOL (ISOVUE-300) INJECTION 61%  COMPARISON: 11/24/2006  FINDINGS: On image number 112 in the lateral left lung base there is a 4 mm pulmonary  There is a large pleural-based mass posteriorly in the mid to superior aspect of the right lower lobe. The mass shows heterogeneous attenuation an faint heterogeneous enhancement with a focus of either more prominent vascular enhancement or calcification along its inferomedial border. New the mass is inseparable from the pleura but there is no evidence of underlying rib destruction. There is a small right pleural effusion inferiorly.  The mass extends from the lateral pleural surface across the right lower  lobe and into the as ago esophageal recess were it is inseparable from the mediastinum. There is mild anterior mass-effect upon the right main bronchus and segments of the lower lobe airways posteriorly. There is also mild mass effect on the esophagus.  There is no significant hilar or mediastinal adenopathy appreciated. Heart is normal with no pericardial effusion. Thoracic aorta shows no dissection or dilatation. Nodule. The left lung is otherwise clear. No significant axillary adenopathy. No acute musculoskeletal findings. Images through the upper abdomen demonstrate no acute abnormalities. There is a low-attenuation round lesion measuring 17 mm in the medial midpole of the right kidney, with average attenuation of 16. A 3 mm low-attenuation upper pole right renal lesion is noted too small to characterize likely a cyst.  IMPRESSION: 1. Large enhancing soft tissue pleural-based mass posteriorly in the right lower lobe involving the superior  segment with extension into the mediastinum. This would be amenable to CT guided core needle biopsy. Associated small pleural effusion on the right. 2. Indeterminate 4 mm pulmonary nodule left lung base. 3. 17 mm right renal lesion likely a cyst. Consider renal ultrasound to confirm.   Electronically Signed  By: Skipper Cliche M.D.  On: 11/06/2015 11:41   NUCLEAR MEDICINE PET SKULL BASE TO THIGH  TECHNIQUE: 9.6 mCi F-18 FDG was injected intravenously. Full-ring PET imaging was performed from the skull base to thigh after the radiotracer. CT data was obtained and used for attenuation correction and anatomic localization.  FASTING BLOOD GLUCOSE: Value: 93 mg/dl  COMPARISON: Chest CT on 11/06/2015  FINDINGS: NECK  No hypermetabolic lymph nodes in the neck.  CHEST  No hypermetabolic mediastinal or hilar nodes. Large mass in the superior right lower lobe which abuts the mediastinum and posterior chest wall measures  approximately 12 cm in maximum diameter. This shows hypermetabolic activity with central necrosis, with SUV max of 3.2. While this extends into the right hilum common no separate hypermetabolic hilar or mediastinal lymph nodes are identified. No other hypermetabolic lymph nodes identified within the thorax. Tiny 4 mm subpleural pulmonary nodule in the left lung base on image 61 shows no metabolic activity but is too small characterize by PET.  ABDOMEN/PELVIS  No abnormal hypermetabolic activity within the liver, pancreas, adrenal glands, or spleen. No hypermetabolic lymph nodes in the abdomen or pelvis.  SKELETON  No focal hypermetabolic activity to suggest skeletal metastasis.  IMPRESSION: 12 cm right lower lobe mass shows central necrosis and low-grade metabolic activity, consistent with low-grade bronchogenic carcinoma.  No evidence of thoracic nodal or distant metastatic disease.  4 mm left lower lobe pulmonary nodule abutting the left hemidiaphragm is too small to characterize by PET. Recommend continued attention on imaging followup.   Electronically Signed  By: Earle Gell M.D.  On: 12/06/2015 15:06  I personally reviewed the CT and PET/CT. There is a 12 cm right lower lobe mass that is highly suspicious for an adenocarcinoma with lepidic spread.  Impression: 55 year old nonsmoker who has a large right lower lobe mass. This has low-grade metabolic activity on PET CT and extensive central necrosis. Unfortunately the CT-guided biopsy was taken from an area that was primarily necrosis. 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 Those can often occupying entire lobes without regional or distant metastases. It is interesting that there were some spindle cells so this could be some type of very low-grade sarcoma or spindle cell tumor. In any event we need to know exactly what this mass is so that we can treated  appropriately.   My recommendation was that we do a bronchoscopy and endobronchial ultrasound to further evaluate the mass. He would need a bronchoscopy regardless. If that is still nondiagnostic we could try another CT-guided biopsy aiming for an area where there is more metabolic activity.  I described the proposed procedure to Mr. Haskin and his wife. They understand this will be done in the operating room under general anesthesia. There would be no incisions, all were could be done via endotracheal tube. We would plan to do this on an outpatient basis. We discussed the indications, risks, benefits, and alternatives. They understand the risks include, but are not limited to death, MI, stroke, DVT, PE (all of which I think are exceedingly small risks), bleeding, pneumothorax, and failure to make a definitive diagnosis. They understand this is diagnostic and not therapeutic.  He will also need an MR of the brain and pulmonary function testing. We will go ahead and arrange those, the order is not important.   Plan: MR brain  Pulmonary function testing  Bronchoscopy and endobronchial ultrasound on Monday, 12/17/2015  [name], MD [facility] [phone]

## 2015-12-17 NOTE — Op Note (Signed)
NAME:  [name], [name] NO.:  [id number]  MEDICAL RECORD NO.:  [id number]  LOCATION:  [facility]                         FACILITY:  [facility]  PHYSICIAN:  Revonda Standard. Roxan Hockey, M.D.DATE OF BIRTH:  [birth year]  DATE OF PROCEDURE:  12/17/2015 DATE OF DISCHARGE:  12/17/2015                              OPERATIVE REPORT   PREOPERATIVE DIAGNOSIS:  Right lower lobe mass.  POSTOPERATIVE DIAGNOSIS:  Right lower lobe mass.  PROCEDURE:  Bronchoscopy with brushings and biopsies  Endobronchial ultrasound with aspiration of mass.  SURGEON:  Revonda Standard. Roxan Hockey, M.D.  ASSISTANT:  None.  ANESTHESIA:  General.  FINDINGS:  Mass visible by ultrasound along the bronchus intermedius to approximately the level of the right upper lobe takeoff. 2 separate right upper lobe bronchi. Extrinsic compression of superior segmental bronchus take off and carina between superior segmental and basilar segmental bronchi. Thickening on the lower lobe side of the basilar Segmental- middle lobe carina.  Middle lobe with 3 segmental bronchi.  CLINICAL NOTE:  Mathew [name] is a 54 year old nonsmoker, who recently has noted decreased energy and wheezing with exertion.  Workup revealed a 12 cm mass in the right chest.  It was unclear initially if this was a pleural- based or whether it was in the right lower lobe itself.  This was biopsied, but only necrotic debris was obtained.  He was advised to undergo bronchoscopy and endobronchial ultrasound for diagnostic purposes.  The indications, risks, benefits, and alternatives were discussed in detail with the patient.  He understood and accepted the risks and agreed to proceed.  OPERATIVE NOTE:  Mathew [name] was brought to the operating room on December 17, 2015.  He had induction of general anesthesia and was intubated. Flexible fiberoptic bronchoscopy was performed via the endotracheal tube.  The trachea appeared normal as did the left tracheobronchial tree.  On  the right side, there were 2 separate right upper lobe bronchi, one giving rise to 2 segmental bronchi.  The second supplying 1 segment.  Right middle lobe gave rise to 3 segmental bronchi. Thickening on the lower lobe side of the carina between the basilar segments and the middle lobe bronchus. And thickening of the entire carina between the superior segmental bronchus and the basilar segmental bronchi. Extrinsic compression on the lower lobe bronchi, primarily on the superior segmental bronchus.  The bronchoscope was removed.  The endobronchial ultrasound probe then was advanced, and systematic inspection was carried out looking for lymph nodes.  There were no nodes visible in the 4R location.  There was a level 7 node identified.  This was relatively small.  Aspirations were performed.  With each aspiration, the needle was advanced into the lymph node.  The inner cannula was removed, suction was applied, and 10-12 passes were made in the node while visualizing with the ultrasound.  The specimen then was placed on the slides and/or into cytologic preparation fluid. Multiple samples were obtained from each node.  There was no good window to aspirate a 10R node.  The endobronchial ultrasound probe was advanced into the bronchus intermedius and at approximately the level of the takeoff of the second right upper lobe bronchus, the mass was visible posteriorly.  Multiple aspirations were performed from the mass. These specimens were sent for cytology.  The endobronchial ultrasound probe was removed.  The bronchoscope was reinserted.  Brushings and biopsies then were obtained from the superior segmental bronchus.  The brushings were sent for quick prep.  The biopsies were all sent for permanent pathology.  The biopsies led to some mild bleeding.  Dilute epinephrine was applied topically to decrease the bleeding.  The quick preps all returned nondiagnostic, showing only bronchial  epithelial cells.  A final inspection was made, and there was no ongoing bleeding.  The bronchoscope was removed.  The patient was extubated in the operating room and taken to the postanesthetic care unit in good condition.     Revonda Standard Roxan Hockey, M.D.     SCH/MEDQ  D:  12/17/2015  T:  12/17/2015  Job:  [id number]

## 2015-12-17 NOTE — Anesthesia Postprocedure Evaluation (Signed)
Anesthesia Post Note  Patient: Mathew [name]  Procedure(s) Performed: Procedure(s) (LRB): VIDEO BRONCHOSCOPY WITH ENDOBRONCHIAL ULTRASOUND (N/A)  Patient location during evaluation: PACU Anesthesia Type: General Level of consciousness: awake Pain management: pain level controlled Vital Signs Assessment: post-procedure vital signs reviewed and stable Respiratory status: spontaneous breathing Cardiovascular status: stable Postop Assessment: no signs of nausea or vomiting Anesthetic complications: no    Last Vitals:  Filed Vitals:   12/17/15 1500 12/17/15 1515  BP: 123/70 114/72  Pulse: 66 67  Temp:  36.6 C  Resp: 17 16    Last Pain: There were no vitals filed for this visit.               Davanee Klinkner

## 2015-12-17 NOTE — Discharge Instructions (Signed)
Do not drive or engage in heavy physical activity for 24 hours  You may gradually resume normal activities beginning tomorrow  You may cough up small amounts of blood over the next few days  Call [phone] if you cough up more than 2 tablespoons of blood  You may use acetaminophen (Tylenol) or ibuprofen (Advil) for discomfort  You may use an over the counter cough medication if needed  Call [phone] if you develop chest pain, shortness of breath, or fever > 101 F  My office will contact you with a follow up appointment for later this week.

## 2015-12-17 NOTE — Brief Op Note (Signed)
12/17/2015  2:49 PM  PATIENT:  Mathew [name]  55 y.o. male  PRE-OPERATIVE DIAGNOSIS:  RIGHT LOWER LOBE MASS  POST-OPERATIVE DIAGNOSIS:  RIGHT LOWER LOBE MASS  PROCEDURE:   BRONCHOSCOPY with BRUSHINGS and BIOPSIES ENDOBRONCHIAL ULTRASOUND   SURGEON:  Surgeon(s) and Role:    * [name], MD - Primary  PHYSICIAN ASSISTANT:   ASSISTANTS: none   ANESTHESIA:   general  EBL:  Total I/O In: 1200 [I.V.:1200] Out: -   BLOOD ADMINISTERED:none  DRAINS: none   LOCAL MEDICATIONS USED:  NONE  SPECIMEN:  Source of Specimen:  Right lower lobe mass and Level 7 lymph node  DISPOSITION OF SPECIMEN:  PATHOLOGY  PLAN OF CARE: Discharge to home after PACU  PATIENT DISPOSITION:  PACU - hemodynamically stable.   Delay start of Pharmacological VTE agent (>24hrs) due to surgical blood loss or risk of bleeding: not applicable

## 2015-12-17 NOTE — Transfer of Care (Signed)
Immediate Anesthesia Transfer of Care Note  Patient: Mathew [name]  Procedure(s) Performed: Procedure(s): VIDEO BRONCHOSCOPY WITH ENDOBRONCHIAL ULTRASOUND (N/A)  Patient Location: PACU  Anesthesia Type:General  Level of Consciousness: awake, alert  and patient cooperative  Airway & Oxygen Therapy: Patient Spontanous Breathing and Patient connected to nasal cannula oxygen  Post-op Assessment: Report given to RN and Post -op Vital signs reviewed and stable  Post vital signs: Reviewed and stable  Last Vitals:  Filed Vitals:   12/17/15 1201  BP: 148/89  Pulse: 60  Temp: 36.9 C  Resp: 18    Last Pain: There were no vitals filed for this visit.    Patients Stated Pain Goal: 4 (45/36/46 8032)  Complications: No apparent anesthesia complications

## 2015-12-17 NOTE — Anesthesia Preprocedure Evaluation (Signed)
Anesthesia Evaluation  Patient identified by MRN, date of birth, ID band Patient awake    Reviewed: Allergy & Precautions, NPO status , Patient's Chart, lab work & pertinent test results  History of Anesthesia Complications Negative for: history of anesthetic complications  Airway Mallampati: II  TM Distance: >3 FB Neck ROM: Full    Dental  (+) Teeth Intact   Pulmonary neg pulmonary ROS,    breath sounds clear to auscultation       Cardiovascular negative cardio ROS   Rhythm:Regular     Neuro/Psych negative neurological ROS  negative psych ROS   GI/Hepatic negative GI ROS, Neg liver ROS,   Endo/Other  negative endocrine ROS  Renal/GU negative Renal ROS     Musculoskeletal   Abdominal   Peds  Hematology negative hematology ROS (+)   Anesthesia Other Findings   Reproductive/Obstetrics                             Anesthesia Physical Anesthesia Plan  ASA: I  Anesthesia Plan: General   Post-op Pain Management:    Induction: Intravenous  Airway Management Planned: Oral ETT  Additional Equipment: None  Intra-op Plan:   Post-operative Plan: Extubation in OR  Informed Consent: I have reviewed the patients History and Physical, chart, labs and discussed the procedure including the risks, benefits and alternatives for the proposed anesthesia with the patient or authorized representative who has indicated his/her understanding and acceptance.   Dental advisory given  Plan Discussed with: CRNA and Surgeon  Anesthesia Plan Comments:         Anesthesia Quick Evaluation

## 2015-12-17 NOTE — Interval H&P Note (Signed)
History and Physical Interval Note:  12/17/2015 12:26 PM  Mathew [name]  has presented today for surgery, with the diagnosis of RIGHT LOWER LOBE MASS  The various methods of treatment have been discussed with the patient and family. After consideration of risks, benefits and other options for treatment, the patient has consented to  Procedure(s): Chase (N/A) as a surgical intervention .  The patient's history has been reviewed, patient examined, no change in status, stable for surgery.  I have reviewed the patient's chart and labs.  Questions were answered to the patient's satisfaction.     [name]

## 2015-12-18 LAB — ACID FAST SMEAR (AFB)

## 2015-12-18 LAB — ACID FAST SMEAR (AFB, MYCOBACTERIA): Acid Fast Smear: NEGATIVE

## 2015-12-19 ENCOUNTER — Encounter (HOSPITAL_COMMUNITY): Payer: Self-pay | Admitting: Thoracic Surgery (Cardiothoracic Vascular Surgery)

## 2015-12-19 LAB — CULTURE, RESPIRATORY: GRAM STAIN: NONE SEEN

## 2015-12-19 LAB — CULTURE, RESPIRATORY W GRAM STAIN: Culture: NO GROWTH

## 2015-12-20 ENCOUNTER — Ambulatory Visit (HOSPITAL_COMMUNITY)
Admission: RE | Admit: 2015-12-20 | Discharge: 2015-12-20 | Disposition: A | Discharge status: Home / Self Care | Payer: BLUE CROSS/BLUE SHIELD | Source: Ambulatory Visit | Attending: Thoracic Surgery (Cardiothoracic Vascular Surgery) | Admitting: Thoracic Surgery (Cardiothoracic Vascular Surgery)

## 2015-12-20 DIAGNOSIS — R918 Other nonspecific abnormal finding of lung field: Secondary | ICD-10-CM

## 2015-12-20 LAB — PULMONARY FUNCTION TEST
DL/VA % pred: 117 %
DL/VA: 5.38 ml/min/mmHg/L
DLCO UNC: 29.79 ml/min/mmHg
DLCO unc % pred: 96 %
FEF 25-75 PRE: 2.24 L/s
FEF 25-75 Post: 2.71 L/sec
FEF2575-%Change-Post: 21 %
FEF2575-%PRED-PRE: 76 %
FEF2575-%Pred-Post: 92 %
FEV1-%CHANGE-POST: 4 %
FEV1-%PRED-POST: 105 %
FEV1-%Pred-Pre: 100 %
FEV1-PRE: 3.08 L
FEV1-Post: 3.23 L
FEV1FVC-%CHANGE-POST: 5 %
FEV1FVC-%PRED-PRE: 94 %
FEV6-%CHANGE-POST: 0 %
FEV6-%PRED-POST: 108 %
FEV6-%Pred-Pre: 108 %
FEV6-Post: 4.11 L
FEV6-Pre: 4.12 L
FEV6FVC-%CHANGE-POST: 0 %
FEV6FVC-%PRED-POST: 103 %
FEV6FVC-%Pred-Pre: 103 %
FVC-%Change-Post: 0 %
FVC-%Pred-Post: 105 %
FVC-%Pred-Pre: 105 %
FVC-POST: 4.12 L
FVC-Pre: 4.14 L
POST FEV1/FVC RATIO: 78 %
PRE FEV6/FVC RATIO: 99 %
Post FEV6/FVC ratio: 100 %
Pre FEV1/FVC ratio: 74 %
RV % pred: 80 %
RV: 1.71 L
TLC % PRED: 88 %
TLC: 5.97 L

## 2015-12-20 MED ORDER — ALBUTEROL SULFATE (2.5 MG/3ML) 0.083% IN NEBU
2.5000 mg | INHALATION_SOLUTION | Freq: Once | RESPIRATORY_TRACT | Status: AC
  Administered 2015-12-20: 2.5 mg via RESPIRATORY_TRACT

## 2015-12-21 ENCOUNTER — Ambulatory Visit (INDEPENDENT_AMBULATORY_CARE_PROVIDER_SITE_OTHER): Payer: BLUE CROSS/BLUE SHIELD | Admitting: Thoracic Surgery (Cardiothoracic Vascular Surgery)

## 2015-12-21 ENCOUNTER — Encounter: Payer: Self-pay | Admitting: Thoracic Surgery (Cardiothoracic Vascular Surgery)

## 2015-12-21 ENCOUNTER — Other Ambulatory Visit: Payer: Self-pay | Admitting: *Deleted

## 2015-12-21 VITALS — BP 170/90 | HR 63 | Resp 16 | Ht 68.0 in | Wt 180.0 lb

## 2015-12-21 DIAGNOSIS — R918 Other nonspecific abnormal finding of lung field: Secondary | ICD-10-CM

## 2015-12-21 DIAGNOSIS — J984 Other disorders of lung: Secondary | ICD-10-CM | POA: Diagnosis not present

## 2015-12-21 DIAGNOSIS — R222 Localized swelling, mass and lump, trunk: Secondary | ICD-10-CM

## 2015-12-21 NOTE — Progress Notes (Signed)
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 He was in his usual state of health until January when he felt ill. He initially complained of decreased energy and some wheezing with exertion. He never had much of a cough and denies any hemoptysis. He thought he had a virus. He developed some other symptoms including swelling in his knees and ankles and was referred to rheumatology. The rheumatologist noticed some clubbing of his nails and did a chest x-ray. That showed a right lung mass. That led to a CT which showed a 12 cm mass in the right lower lobe. A CT-guided biopsy showed mostly necrosis. There were a few spindle cells noted but they were not particularly atypical.  A PET CT was done more recently which showed low-grade uptake with central necrosis. There was no hilar or mediastinal adenopathy.  I did bronchoscopy and endobronchial ultrasound on 12/17/2015. There was no endobronchial mass. With endobronchial ultrasound we were able to see the mass clearly a rotating as the bronchus intermedius but multiple samples came back negative for tumor. All the transbronchial biopsies showed benign lung tissue. He tolerated the procedure well. He does have some cough and mucus production.  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  Past Medical History  Diagnosis Date  . Clubbing of nails   . Vitamin D deficiency   . GERD (gastroesophageal reflux disease)   . Deafness in left ear   . Asthma     asthma as a child      Current Outpatient Prescriptions  Medication Sig Dispense Refill  . Ascorbic Acid (VITAMIN C) 1000 MG tablet Take 1,000 mg by mouth daily.    . Cholecalciferol (VITAMIN D3) 5000 units TABS Take 1 tablet by mouth daily.    . Iodine, Kelp, (KELP PO) Take  500 mg by mouth daily.    . Probiotic Product (PROBIOTIC DAILY PO) Take 1 capsule by mouth daily.    . Zinc 100 MG TABS Take 1 tablet by mouth daily.     No current facility-administered medications for this visit.    Physical Exam BP 170/90 mmHg  Pulse 63  Resp 16  Ht '5\' 8"'$  (1.727 m)  Wt 180 lb (81.647 kg)  BMI 27.38 kg/m2  SpO75 56% Mathew Mathew [name] in no acute distress Well-developed and well-nourished Cardiac regular rate and rhythm normal S1 and S2 Lungs diminished breath sounds posteriorly on the right  Diagnostic Tests: Pathology results reviewed- nondiagnostic  Impression: 55 year old Mathew [name] with a 12 cm right chest mass of unknown etiology. A CT-guided biopsy and bronchoscopy and endobronchial ultrasound biopsies were all negative. Of note he did have some spindle cells seen on the CT-guided biopsy, which raises the possibility of a benign tumor, such as a Schwannoma, of chest wall origin. It also could be a very low-grade well-differentiated adenocarcinoma of the lung.  I discussed 2 different options with Mathew Mathew [name] on today. We discussed having a repeat CT-guided biopsy done. Now that the PET scan has been done there is a better chance of getting a good sample. However there also is still a possibility that it will be nondiagnostic. The second option, which  I favor, is to proceed with surgery. We'll plan to do a right thoracotomy but start with a small incision and take a biopsy of the lesion. If benign we can probably decompressed the tumor to a large extent and then resected in a more minimally invasive fashion. If malignant he would need a full thoracotomy.  I discussed the proposed operation which is right thoracotomy, resection of chest wall mass, possible lung resection with Mathew Mathew [name]. They understand the nature of the procedure, the intraoperative decision making, the need for general anesthesia, the incisions be used, and the need for drainage tubes  postoperatively. We discussed the expected hospital stay and overall recovery. I reviewed the indications, risks, benefits, and alternatives. They understand the risks include, but are not limited to death, MI, DVT, PE, bleeding, possible need for transfusion, infection, air leak, acute and/or chronic pain, as well as the possibility of other unforeseeable complications. They understand that we cannot give him any prognosis at this point as we still do not have a definitive diagnosis.  Mathew Mathew [name] is leaning towards proceeding with surgery without another biopsy. He is scheduled to compete in the [employer] in about 3 weeks. We will go and schedule surgery for the week after that on Wednesday, August 9.  If he wishes to have another CT-guided biopsy done prior to surgery we will schedule that for him.  Plan:  Right thoracotomy, resection of right chest mass, possible lung resection on Wednesday, 01/23/2016.  I spent 25 minutes face-to-face with Mathew Mathew [name] during this visit. [name], MD [facility] [phone]

## 2016-01-09 LAB — FUNGUS CULTURE RESULT

## 2016-01-09 LAB — FUNGAL ORGANISM REFLEX

## 2016-01-21 ENCOUNTER — Encounter (HOSPITAL_COMMUNITY): Payer: Self-pay

## 2016-01-21 ENCOUNTER — Encounter (HOSPITAL_COMMUNITY)
Admission: RE | Admit: 2016-01-21 | Discharge: 2016-01-21 | Disposition: A | Discharge status: Home / Self Care | Payer: BLUE CROSS/BLUE SHIELD | Source: Ambulatory Visit | Attending: Thoracic Surgery (Cardiothoracic Vascular Surgery) | Admitting: Thoracic Surgery (Cardiothoracic Vascular Surgery)

## 2016-01-21 ENCOUNTER — Ambulatory Visit (HOSPITAL_COMMUNITY): Admission: RE | Admit: 2016-01-21 | Payer: BLUE CROSS/BLUE SHIELD | Source: Ambulatory Visit

## 2016-01-21 DIAGNOSIS — R222 Localized swelling, mass and lump, trunk: Secondary | ICD-10-CM

## 2016-01-21 HISTORY — DX: Reserved for inherently not codable concepts without codable children: IMO0001

## 2016-01-21 HISTORY — DX: Unspecified osteoarthritis, unspecified site: M19.90

## 2016-01-21 LAB — PROTIME-INR
INR: 1.23
Prothrombin Time: 15.5 seconds — ABNORMAL HIGH (ref 11.4–15.2)

## 2016-01-21 LAB — BLOOD GAS, ARTERIAL
ACID-BASE EXCESS: 3.8 mmol/L — AB (ref 0.0–2.0)
BICARBONATE: 27.8 meq/L — AB (ref 20.0–24.0)
DRAWN BY: 421801
FIO2: 21
O2 SAT: 97.8 %
PATIENT TEMPERATURE: 98.6
PO2 ART: 105 mmHg — AB (ref 80.0–100.0)
TCO2: 29.1 mmol/L (ref 0–100)
pCO2 arterial: 41.9 mmHg (ref 35.0–45.0)
pH, Arterial: 7.437 (ref 7.350–7.450)

## 2016-01-21 LAB — COMPREHENSIVE METABOLIC PANEL
ALBUMIN: 3.1 g/dL — AB (ref 3.5–5.0)
ALT: 46 U/L (ref 17–63)
ANION GAP: 8 (ref 5–15)
AST: 22 U/L (ref 15–41)
Alkaline Phosphatase: 261 U/L — ABNORMAL HIGH (ref 38–126)
BUN: 15 mg/dL (ref 6–20)
CHLORIDE: 105 mmol/L (ref 101–111)
CO2: 24 mmol/L (ref 22–32)
Calcium: 8.8 mg/dL — ABNORMAL LOW (ref 8.9–10.3)
Creatinine, Ser: 1.21 mg/dL (ref 0.61–1.24)
GFR calc Af Amer: >60 mL/min (ref >60)
GFR calc non Af Amer: >60 mL/min (ref >60)
GLUCOSE: 83 mg/dL (ref 65–99)
POTASSIUM: 4 mmol/L (ref 3.5–5.1)
SODIUM: 137 mmol/L (ref 135–145)
Total Bilirubin: 0.7 mg/dL (ref 0.3–1.2)
Total Protein: 5.7 g/dL — ABNORMAL LOW (ref 6.5–8.1)

## 2016-01-21 LAB — SURGICAL PCR SCREEN
MRSA, PCR: NEGATIVE
Staphylococcus aureus: NEGATIVE

## 2016-01-21 LAB — CBC
HCT: 40.1 % (ref 39.0–52.0)
Hemoglobin: 12.7 g/dL — ABNORMAL LOW (ref 13.0–17.0)
MCH: 28.9 pg (ref 26.0–34.0)
MCHC: 31.7 g/dL (ref 30.0–36.0)
MCV: 91.3 fL (ref 78.0–100.0)
Platelets: 205 10*3/uL (ref 150–400)
RBC: 4.39 MIL/uL (ref 4.22–5.81)
RDW: 14.3 % (ref 11.5–15.5)
WBC: 9 10*3/uL (ref 4.0–10.5)

## 2016-01-21 LAB — APTT: APTT: 34 s (ref 24–36)

## 2016-01-21 LAB — URINALYSIS, ROUTINE W REFLEX MICROSCOPIC
Bilirubin Urine: NEGATIVE
Glucose, UA: NEGATIVE mg/dL
HGB URINE DIPSTICK: NEGATIVE
KETONES UR: NEGATIVE mg/dL
LEUKOCYTES UA: NEGATIVE
Nitrite: NEGATIVE
PROTEIN: NEGATIVE mg/dL
Specific Gravity, Urine: 1.021 (ref 1.005–1.030)
pH: 6 (ref 5.0–8.0)

## 2016-01-21 LAB — FUNGUS CULTURE WITH STAIN

## 2016-01-21 LAB — ABO/RH: ABO/RH(D): O POS

## 2016-01-21 NOTE — Pre-Procedure Instructions (Signed)
    ERYK BEAVERS  01/21/2016      [id number] [place] [address] [place] [address]Phone: [phone]: [phone]   Your procedure is scheduled on Wednesday, August 9th   [facility]             (Posted surgery time is 8:30 am to 1:45 pm)   Call this number if you have problems the morning of surgery:  [phone]   Remember:  Do not eat food or drink liquids after midnight Tuesday.   Take these medicines the morning of surgery with A SIP OF WATER : NONE   Do not wear jewelry - no rings or watches.  Do not wear lotions or colognes.                Men may shave face and neck.  Do not bring valuables to the hospital.  [facility] belongings or [facility].  Contacts, dentures or bridgework may not be worn into surgery.  Leave your suitcase in the car.  After surgery it may be brought to your room. For patients admitted to the hospital, discharge time will be determined by your treatment team.  Name and phone number of your driver:     Please read over the following fact sheets that you were given. Pain Booklet, MRSA Information and Surgical Site Infection Prevention

## 2016-01-22 HISTORY — PX: OTHER SURGICAL HISTORY: SHX169

## 2016-01-22 LAB — TYPE AND SCREEN
ABO/RH(D): O POS
Antibody Screen: NEGATIVE

## 2016-01-22 MED ORDER — VANCOMYCIN HCL IN DEXTROSE 1-5 GM/200ML-% IV SOLN
1000.0000 mg | INTRAVENOUS | Status: AC
  Administered 2016-01-23: 1000 mg via INTRAVENOUS
  Filled 2016-01-22: qty 200

## 2016-01-23 ENCOUNTER — Encounter (HOSPITAL_COMMUNITY): Payer: Self-pay | Admitting: Anesthesiology

## 2016-01-23 ENCOUNTER — Inpatient Hospital Stay (HOSPITAL_COMMUNITY): Payer: BLUE CROSS/BLUE SHIELD

## 2016-01-23 ENCOUNTER — Inpatient Hospital Stay (HOSPITAL_COMMUNITY)
Admission: RE | Admit: 2016-01-23 | Discharge: 2016-01-27 | DRG: 164 | Disposition: A | Discharge status: Home / Self Care | Payer: BLUE CROSS/BLUE SHIELD | Source: Ambulatory Visit | Attending: Thoracic Surgery (Cardiothoracic Vascular Surgery) | Admitting: Thoracic Surgery (Cardiothoracic Vascular Surgery)

## 2016-01-23 ENCOUNTER — Encounter (HOSPITAL_COMMUNITY)
Admission: RE | Disposition: A | Discharge status: Home / Self Care | Payer: Self-pay | Source: Ambulatory Visit | Attending: Thoracic Surgery (Cardiothoracic Vascular Surgery)

## 2016-01-23 ENCOUNTER — Inpatient Hospital Stay (HOSPITAL_COMMUNITY): Payer: BLUE CROSS/BLUE SHIELD | Admitting: Anesthesiology

## 2016-01-23 DIAGNOSIS — Z01811 Encounter for preprocedural respiratory examination: Secondary | ICD-10-CM

## 2016-01-23 DIAGNOSIS — R222 Localized swelling, mass and lump, trunk: Secondary | ICD-10-CM

## 2016-01-23 DIAGNOSIS — Z79899 Other long term (current) drug therapy: Secondary | ICD-10-CM

## 2016-01-23 DIAGNOSIS — H9192 Unspecified hearing loss, left ear: Secondary | ICD-10-CM | POA: Diagnosis present

## 2016-01-23 DIAGNOSIS — K219 Gastro-esophageal reflux disease without esophagitis: Secondary | ICD-10-CM | POA: Diagnosis present

## 2016-01-23 DIAGNOSIS — J9382 Other air leak: Secondary | ICD-10-CM | POA: Diagnosis not present

## 2016-01-23 DIAGNOSIS — I959 Hypotension, unspecified: Secondary | ICD-10-CM | POA: Diagnosis not present

## 2016-01-23 DIAGNOSIS — Z4682 Encounter for fitting and adjustment of non-vascular catheter: Secondary | ICD-10-CM

## 2016-01-23 DIAGNOSIS — J939 Pneumothorax, unspecified: Secondary | ICD-10-CM | POA: Diagnosis not present

## 2016-01-23 DIAGNOSIS — R683 Clubbing of fingers: Secondary | ICD-10-CM | POA: Diagnosis present

## 2016-01-23 DIAGNOSIS — Z9889 Other specified postprocedural states: Secondary | ICD-10-CM

## 2016-01-23 DIAGNOSIS — R918 Other nonspecific abnormal finding of lung field: Secondary | ICD-10-CM | POA: Diagnosis present

## 2016-01-23 DIAGNOSIS — D1431 Benign neoplasm of right bronchus and lung: Principal | ICD-10-CM | POA: Diagnosis present

## 2016-01-23 DIAGNOSIS — Z09 Encounter for follow-up examination after completed treatment for conditions other than malignant neoplasm: Secondary | ICD-10-CM

## 2016-01-23 HISTORY — PX: RESECTION OF MEDIASTINAL MASS: SHX6497

## 2016-01-23 LAB — CBC
HCT: 38.5 % — ABNORMAL LOW (ref 39.0–52.0)
HEMOGLOBIN: 11.8 g/dL — AB (ref 13.0–17.0)
MCH: 28.6 pg (ref 26.0–34.0)
MCHC: 30.6 g/dL (ref 30.0–36.0)
MCV: 93.2 fL (ref 78.0–100.0)
Platelets: 204 10*3/uL (ref 150–400)
RBC: 4.13 MIL/uL — AB (ref 4.22–5.81)
RDW: 14.7 % (ref 11.5–15.5)
WBC: 16.1 10*3/uL — ABNORMAL HIGH (ref 4.0–10.5)

## 2016-01-23 LAB — BASIC METABOLIC PANEL
Anion gap: 3 — ABNORMAL LOW (ref 5–15)
BUN: 14 mg/dL (ref 6–20)
CHLORIDE: 103 mmol/L (ref 101–111)
CO2: 26 mmol/L (ref 22–32)
Calcium: 7.9 mg/dL — ABNORMAL LOW (ref 8.9–10.3)
Creatinine, Ser: 1.13 mg/dL (ref 0.61–1.24)
GFR calc Af Amer: >60 mL/min (ref >60)
GFR calc non Af Amer: >60 mL/min (ref >60)
Glucose, Bld: 171 mg/dL — ABNORMAL HIGH (ref 65–99)
POTASSIUM: 5.2 mmol/L — AB (ref 3.5–5.1)
SODIUM: 132 mmol/L — AB (ref 135–145)

## 2016-01-23 SURGERY — EXCISION, MASS, MEDIASTINUM
Anesthesia: General | Laterality: Right

## 2016-01-23 MED ORDER — TRAMADOL HCL 50 MG PO TABS
50.0000 mg | ORAL_TABLET | Freq: Four times a day (QID) | ORAL | Status: DC | PRN
  Administered 2016-01-23 – 2016-01-27 (×8): 50 mg via ORAL
  Filled 2016-01-23 (×9): qty 1

## 2016-01-23 MED ORDER — POTASSIUM CHLORIDE 10 MEQ/50ML IV SOLN: 10.0000 meq | Freq: Every day | INTRAVENOUS | Status: DC | PRN

## 2016-01-23 MED ORDER — LIDOCAINE HCL (CARDIAC) 20 MG/ML IV SOLN
INTRAVENOUS | Status: DC | PRN
  Administered 2016-01-23: 60 mg via INTRAVENOUS

## 2016-01-23 MED ORDER — HYDROMORPHONE HCL 1 MG/ML IJ SOLN
0.2500 mg | INTRAMUSCULAR | Status: DC | PRN
  Administered 2016-01-23: 0.5 mg via INTRAVENOUS

## 2016-01-23 MED ORDER — GLYCOPYRROLATE 0.2 MG/ML IJ SOLN
INTRAMUSCULAR | Status: DC | PRN
  Administered 2016-01-23: 0.3 mg via INTRAVENOUS

## 2016-01-23 MED ORDER — NEOSTIGMINE METHYLSULFATE 10 MG/10ML IV SOLN
INTRAVENOUS | Status: DC | PRN
  Administered 2016-01-23: 2 mg via INTRAVENOUS

## 2016-01-23 MED ORDER — DEXTROSE-NACL 5-0.9 % IV SOLN
INTRAVENOUS | Status: DC
  Administered 2016-01-23 – 2016-01-25 (×4): via INTRAVENOUS

## 2016-01-23 MED ORDER — ACETAMINOPHEN 160 MG/5ML PO SOLN
325.0000 mg | ORAL | Status: DC | PRN
  Filled 2016-01-23: qty 20.3

## 2016-01-23 MED ORDER — ACETAMINOPHEN 500 MG PO TABS
1000.0000 mg | ORAL_TABLET | Freq: Four times a day (QID) | ORAL | Status: DC
  Administered 2016-01-23 – 2016-01-27 (×13): 1000 mg via ORAL
  Filled 2016-01-23 (×13): qty 2

## 2016-01-23 MED ORDER — BISACODYL 5 MG PO TBEC
10.0000 mg | DELAYED_RELEASE_TABLET | Freq: Every day | ORAL | Status: DC
  Administered 2016-01-23 – 2016-01-26 (×3): 10 mg via ORAL
  Filled 2016-01-23 (×4): qty 2

## 2016-01-23 MED ORDER — MIDAZOLAM HCL 5 MG/5ML IJ SOLN
INTRAMUSCULAR | Status: DC | PRN
  Administered 2016-01-23: 2 mg via INTRAVENOUS

## 2016-01-23 MED ORDER — HYDROMORPHONE HCL 1 MG/ML IJ SOLN
INTRAMUSCULAR | Status: AC
  Administered 2016-01-23: 0.5 mg
  Filled 2016-01-23: qty 1

## 2016-01-23 MED ORDER — LIDOCAINE-EPINEPHRINE (PF) 1.5 %-1:200000 IJ SOLN
INTRAMUSCULAR | Status: DC | PRN
  Administered 2016-01-23: 6 mL via EPIDURAL

## 2016-01-23 MED ORDER — DEXAMETHASONE SODIUM PHOSPHATE 10 MG/ML IJ SOLN
INTRAMUSCULAR | Status: DC | PRN
  Administered 2016-01-23: 10 mg via INTRAVENOUS

## 2016-01-23 MED ORDER — ONDANSETRON HCL 4 MG/2ML IJ SOLN: 4.0000 mg | Freq: Four times a day (QID) | INTRAMUSCULAR | Status: DC | PRN

## 2016-01-23 MED ORDER — SODIUM CHLORIDE 0.9 % IV BOLUS (SEPSIS)
1000.0000 mL | Freq: Once | INTRAVENOUS | Status: AC
  Administered 2016-01-23: 1000 mL via INTRAVENOUS

## 2016-01-23 MED ORDER — ONDANSETRON HCL 4 MG/2ML IJ SOLN
INTRAMUSCULAR | Status: AC
  Filled 2016-01-23: qty 2

## 2016-01-23 MED ORDER — DEXAMETHASONE SODIUM PHOSPHATE 10 MG/ML IJ SOLN
INTRAMUSCULAR | Status: AC
  Filled 2016-01-23: qty 2

## 2016-01-23 MED ORDER — ROPIVACAINE HCL 2 MG/ML IJ SOLN
INTRAMUSCULAR | Status: DC | PRN
  Administered 2016-01-23: 8 mL/h via EPIDURAL

## 2016-01-23 MED ORDER — PANTOPRAZOLE SODIUM 40 MG PO TBEC
40.0000 mg | DELAYED_RELEASE_TABLET | Freq: Every day | ORAL | Status: DC
  Administered 2016-01-23 – 2016-01-27 (×5): 40 mg via ORAL
  Filled 2016-01-23 (×5): qty 1

## 2016-01-23 MED ORDER — LACTATED RINGERS IV SOLN
INTRAVENOUS | Status: DC | PRN
  Administered 2016-01-23 (×3): via INTRAVENOUS

## 2016-01-23 MED ORDER — ROCURONIUM BROMIDE 100 MG/10ML IV SOLN
INTRAVENOUS | Status: DC | PRN
  Administered 2016-01-23: 40 mg via INTRAVENOUS
  Administered 2016-01-23: 60 mg via INTRAVENOUS

## 2016-01-23 MED ORDER — ACETAMINOPHEN 325 MG PO TABS: 325.0000 mg | ORAL_TABLET | ORAL | Status: DC | PRN

## 2016-01-23 MED ORDER — ACETAMINOPHEN 160 MG/5ML PO SOLN: 1000.0000 mg | Freq: Four times a day (QID) | ORAL | Status: DC

## 2016-01-23 MED ORDER — LIDOCAINE 2% (20 MG/ML) 5 ML SYRINGE
INTRAMUSCULAR | Status: AC
  Filled 2016-01-23: qty 10

## 2016-01-23 MED ORDER — PHENYLEPHRINE HCL 10 MG/ML IJ SOLN
INTRAVENOUS | Status: DC | PRN
  Administered 2016-01-23: 40 ug/min via INTRAVENOUS

## 2016-01-23 MED ORDER — MIDAZOLAM HCL 2 MG/2ML IJ SOLN
INTRAMUSCULAR | Status: AC
  Filled 2016-01-23: qty 2

## 2016-01-23 MED ORDER — OXYCODONE HCL 5 MG/5ML PO SOLN: 5.0000 mg | Freq: Once | ORAL | Status: DC | PRN

## 2016-01-23 MED ORDER — HYPROMELLOSE (GONIOSCOPIC) 2.5 % OP SOLN
1.0000 [drp] | OPHTHALMIC | Status: DC | PRN
  Filled 2016-01-23: qty 15

## 2016-01-23 MED ORDER — LACTATED RINGERS IV SOLN
INTRAVENOUS | Status: DC | PRN
  Administered 2016-01-23: 08:00:00 via INTRAVENOUS

## 2016-01-23 MED ORDER — PROPOFOL 10 MG/ML IV BOLUS
INTRAVENOUS | Status: DC | PRN
  Administered 2016-01-23: 160 mg via INTRAVENOUS

## 2016-01-23 MED ORDER — 0.9 % SODIUM CHLORIDE (POUR BTL) OPTIME
TOPICAL | Status: DC | PRN
  Administered 2016-01-23: 2000 mL

## 2016-01-23 MED ORDER — OXYCODONE HCL 5 MG PO TABS: 5.0000 mg | ORAL_TABLET | Freq: Once | ORAL | Status: DC | PRN

## 2016-01-23 MED ORDER — OXYCODONE HCL 5 MG PO TABS
5.0000 mg | ORAL_TABLET | Freq: Four times a day (QID) | ORAL | Status: DC | PRN
  Administered 2016-01-24 – 2016-01-25 (×2): 5 mg via ORAL
  Filled 2016-01-23 (×2): qty 1

## 2016-01-23 MED ORDER — PROPOFOL 10 MG/ML IV BOLUS
INTRAVENOUS | Status: AC
  Filled 2016-01-23: qty 20

## 2016-01-23 MED ORDER — SENNOSIDES-DOCUSATE SODIUM 8.6-50 MG PO TABS
1.0000 | ORAL_TABLET | Freq: Every day | ORAL | Status: DC
  Administered 2016-01-23 – 2016-01-26 (×4): 1 via ORAL
  Filled 2016-01-23 (×5): qty 1

## 2016-01-23 MED ORDER — ROPIVACAINE HCL 2 MG/ML IJ SOLN
8.0000 mL/h | INTRAMUSCULAR | Status: DC
  Administered 2016-01-24 – 2016-01-26 (×3): 8 mL/h via EPIDURAL
  Filled 2016-01-23 (×8): qty 200

## 2016-01-23 MED ORDER — ONDANSETRON HCL 4 MG/2ML IJ SOLN
INTRAMUSCULAR | Status: DC | PRN
  Administered 2016-01-23 (×2): 4 mg via INTRAVENOUS

## 2016-01-23 MED ORDER — ROCURONIUM BROMIDE 10 MG/ML (PF) SYRINGE
PREFILLED_SYRINGE | INTRAVENOUS | Status: AC
  Filled 2016-01-23: qty 10

## 2016-01-23 MED ORDER — FENTANYL CITRATE (PF) 100 MCG/2ML IJ SOLN
INTRAMUSCULAR | Status: DC | PRN
  Administered 2016-01-23: 50 ug via INTRAVENOUS
  Administered 2016-01-23: 100 ug via INTRAVENOUS

## 2016-01-23 MED ORDER — HEMOSTATIC AGENTS (NO CHARGE) OPTIME
TOPICAL | Status: DC | PRN
  Administered 2016-01-23 (×2): 1 via TOPICAL

## 2016-01-23 MED ORDER — ALBUMIN HUMAN 5 % IV SOLN
12.5000 g | Freq: Once | INTRAVENOUS | Status: AC
  Administered 2016-01-23: 12.5 g via INTRAVENOUS
  Filled 2016-01-23: qty 500

## 2016-01-23 MED ORDER — EPHEDRINE SULFATE 50 MG/ML IJ SOLN
INTRAMUSCULAR | Status: DC | PRN
  Administered 2016-01-23 (×4): 10 mg via INTRAVENOUS

## 2016-01-23 MED ORDER — FENTANYL CITRATE (PF) 250 MCG/5ML IJ SOLN
INTRAMUSCULAR | Status: AC
  Filled 2016-01-23: qty 5

## 2016-01-23 MED ORDER — EPHEDRINE 5 MG/ML INJ
INTRAVENOUS | Status: AC
  Filled 2016-01-23: qty 10

## 2016-01-23 SURGICAL SUPPLY — 106 items
ADH SKN CLS APL DERMABOND .7 (GAUZE/BANDAGES/DRESSINGS)
ADH SKN CLS LQ APL DERMABOND (GAUZE/BANDAGES/DRESSINGS) ×1
APPLIER CLIP 5 13 M/L LIGAMAX5 (MISCELLANEOUS) ×2
APPLIER CLIP ROT 10 11.4 M/L (STAPLE) ×2
APR CLP MED LRG 11.4X10 (STAPLE) ×1
APR CLP MED LRG 5 ANG JAW (MISCELLANEOUS) ×1
BAG SPEC RTRVL LRG 6X4 10 (ENDOMECHANICALS)
BIT DRILL 7/64X5 DISP (BIT) IMPLANT
CANISTER SUCTION 2500CC (MISCELLANEOUS) ×3 IMPLANT
CATH KIT ON Q 5IN SLV (PAIN MANAGEMENT) IMPLANT
CATH ROBINSON RED A/P 22FR (CATHETERS) IMPLANT
CATH THORACIC 28FR (CATHETERS) ×1 IMPLANT
CATH THORACIC 28FR RT ANG (CATHETERS) IMPLANT
CATH THORACIC 36FR (CATHETERS) IMPLANT
CATH THORACIC 36FR RT ANG (CATHETERS) IMPLANT
CLIP APPLIE 5 13 M/L LIGAMAX5 (MISCELLANEOUS) IMPLANT
CLIP APPLIE ROT 10 11.4 M/L (STAPLE) IMPLANT
CLIP TI MEDIUM 6 (CLIP) ×2 IMPLANT
CONN ST 1/4X3/8  BEN (MISCELLANEOUS) ×1
CONN ST 1/4X3/8 BEN (MISCELLANEOUS) IMPLANT
CONN Y 3/8X3/8X3/8  BEN (MISCELLANEOUS)
CONN Y 3/8X3/8X3/8 BEN (MISCELLANEOUS) IMPLANT
CONT SPEC 4OZ CLIKSEAL STRL BL (MISCELLANEOUS) ×8 IMPLANT
DERMABOND ADHESIVE PROPEN (GAUZE/BANDAGES/DRESSINGS) ×1
DERMABOND ADVANCED (GAUZE/BANDAGES/DRESSINGS)
DERMABOND ADVANCED .7 DNX12 (GAUZE/BANDAGES/DRESSINGS) IMPLANT
DERMABOND ADVANCED .7 DNX6 (GAUZE/BANDAGES/DRESSINGS) IMPLANT
DRAIN CHANNEL 28F RND 3/8 FF (WOUND CARE) ×1 IMPLANT
DRAPE LAPAROSCOPIC ABDOMINAL (DRAPES) ×2 IMPLANT
DRAPE PROXIMA HALF (DRAPES) ×1 IMPLANT
DRAPE WARM FLUID 44X44 (DRAPE) ×2 IMPLANT
ELECT BLADE 6.5 EXT (BLADE) ×3 IMPLANT
ELECT REM PT RETURN 9FT ADLT (ELECTROSURGICAL) ×2
ELECTRODE REM PT RTRN 9FT ADLT (ELECTROSURGICAL) ×1 IMPLANT
GAUZE SPONGE 4X4 12PLY STRL (GAUZE/BANDAGES/DRESSINGS) ×2 IMPLANT
GLOVE BIO SURGEON STRL SZ7 (GLOVE) ×1 IMPLANT
GLOVE BIO SURGEON STRL SZ7.5 (GLOVE) ×1 IMPLANT
GLOVE BIOGEL PI IND STRL 7.0 (GLOVE) IMPLANT
GLOVE BIOGEL PI INDICATOR 7.0 (GLOVE) ×1
GLOVE SURG SIGNA 7.5 PF LTX (GLOVE) ×6 IMPLANT
GOWN STRL REUS W/ TWL LRG LVL3 (GOWN DISPOSABLE) ×2 IMPLANT
GOWN STRL REUS W/ TWL XL LVL3 (GOWN DISPOSABLE) ×1 IMPLANT
GOWN STRL REUS W/TWL LRG LVL3 (GOWN DISPOSABLE) ×4
GOWN STRL REUS W/TWL XL LVL3 (GOWN DISPOSABLE) ×2
HEMOSTAT SURGICEL 2X14 (HEMOSTASIS) ×2 IMPLANT
INSERT FOGARTY 61MM (MISCELLANEOUS) IMPLANT
KIT BASIN OR (CUSTOM PROCEDURE TRAY) ×2 IMPLANT
KIT ROOM TURNOVER OR (KITS) ×2 IMPLANT
KIT SUCTION CATH 14FR (SUCTIONS) ×2 IMPLANT
NS IRRIG 1000ML POUR BTL (IV SOLUTION) ×6 IMPLANT
PACK CHEST (CUSTOM PROCEDURE TRAY) ×2 IMPLANT
PAD ARMBOARD 7.5X6 YLW CONV (MISCELLANEOUS) ×4 IMPLANT
POUCH ENDO CATCH II 15MM (MISCELLANEOUS) ×1 IMPLANT
POUCH SPECIMEN RETRIEVAL 10MM (ENDOMECHANICALS) IMPLANT
RELOAD BLUE (STAPLE) ×3 IMPLANT
RELOAD GOLD ECHELON 45 (STAPLE) ×13 IMPLANT
SCISSORS ENDO CVD 5DCS (MISCELLANEOUS) IMPLANT
SEALANT SURG COSEAL 8ML (VASCULAR PRODUCTS) IMPLANT
SHEARS HARMONIC ACE PLUS 36CM (ENDOMECHANICALS) ×1 IMPLANT
SOLUTION ANTI FOG 6CC (MISCELLANEOUS) ×3 IMPLANT
SPECIMEN JAR LG PLASTIC EMPTY (MISCELLANEOUS) IMPLANT
SPECIMEN JAR MEDIUM (MISCELLANEOUS) ×2 IMPLANT
SPECIMEN JAR SMALL (MISCELLANEOUS) ×2 IMPLANT
SPONGE GAUZE 4X4 12PLY STER LF (GAUZE/BANDAGES/DRESSINGS) ×1 IMPLANT
SPONGE INTESTINAL PEANUT (DISPOSABLE) ×1 IMPLANT
SPONGE TONSIL 1 RF SGL (DISPOSABLE) ×2 IMPLANT
STAPLER ECHELON POWERED (MISCELLANEOUS) ×2 IMPLANT
STAPLER VISISTAT 35W (STAPLE) IMPLANT
SUT PROLENE 4 0 RB 1 (SUTURE)
SUT PROLENE 4 0 SH DA (SUTURE) IMPLANT
SUT PROLENE 4-0 RB1 .5 CRCL 36 (SUTURE) IMPLANT
SUT SILK  1 MH (SUTURE) ×3
SUT SILK 1 MH (SUTURE) ×2 IMPLANT
SUT SILK 1 TIES 10X30 (SUTURE) IMPLANT
SUT SILK 2 0 SH (SUTURE) IMPLANT
SUT SILK 2 0SH CR/8 30 (SUTURE) ×1 IMPLANT
SUT SILK 3 0 SH 30 (SUTURE) IMPLANT
SUT SILK 3 0SH CR/8 30 (SUTURE) IMPLANT
SUT VIC AB 1 CTX 36 (SUTURE) ×6
SUT VIC AB 1 CTX36XBRD ANBCTR (SUTURE) IMPLANT
SUT VIC AB 2-0 CT1 27 (SUTURE)
SUT VIC AB 2-0 CT1 TAPERPNT 27 (SUTURE) IMPLANT
SUT VIC AB 2-0 CTX 27 (SUTURE) ×3 IMPLANT
SUT VIC AB 2-0 CTX 36 (SUTURE) IMPLANT
SUT VIC AB 3-0 SH 18 (SUTURE) IMPLANT
SUT VIC AB 3-0 SH 27 (SUTURE)
SUT VIC AB 3-0 SH 27X BRD (SUTURE) IMPLANT
SUT VIC AB 3-0 X1 27 (SUTURE) ×5 IMPLANT
SUT VICRYL 0 UR6 27IN ABS (SUTURE) ×2 IMPLANT
SUT VICRYL 2 TP 1 (SUTURE) ×1 IMPLANT
SWAB COLLECTION DEVICE MRSA (MISCELLANEOUS) IMPLANT
SYR CONTROL 10ML LL (SYRINGE) IMPLANT
SYRINGE 10CC LL (SYRINGE) ×2 IMPLANT
SYSTEM SAHARA CHEST DRAIN ATS (WOUND CARE) ×2 IMPLANT
TAPE CLOTH SURG 4X10 WHT LF (GAUZE/BANDAGES/DRESSINGS) ×1 IMPLANT
TOWEL OR 17X24 6PK STRL BLUE (TOWEL DISPOSABLE) ×4 IMPLANT
TOWEL OR 17X26 10 PK STRL BLUE (TOWEL DISPOSABLE) ×4 IMPLANT
TRAP SPECIMEN MUCOUS 40CC (MISCELLANEOUS) ×2 IMPLANT
TRAY FOLEY METER SIL LF 16FR (CATHETERS) ×2 IMPLANT
TROCAR BLADELESS 5MM (ENDOMECHANICALS) ×1 IMPLANT
TROCAR XCEL BLADELESS 5X75MML (TROCAR) ×1 IMPLANT
TUBE ANAEROBIC SPECIMEN COL (MISCELLANEOUS) IMPLANT
TUBE CONNECTING 12X1/4 (SUCTIONS) ×1 IMPLANT
TUNNELER SHEATH ON-Q 11GX8 DSP (PAIN MANAGEMENT) IMPLANT
WATER STERILE IRR 1000ML POUR (IV SOLUTION) ×2 IMPLANT
YANKAUER SUCT BULB TIP NO VENT (SUCTIONS) ×2 IMPLANT

## 2016-01-23 NOTE — Brief Op Note (Addendum)
01/23/2016  2:30 PM  PATIENT:  Mathew [name]  55 y.o. male  PRE-OPERATIVE DIAGNOSIS:  RIGHT CHEST MASS  POST-OPERATIVE DIAGNOSIS:  RIGHT CHEST MASS  PROCEDURE:   Right Thoracotomy Resection of chest wall mass with en bloc wedge resection of RLL   SURGEON:  Surgeon(s) and Role:    * [name], MD - Primary  ASSISTANTS: Sherian Maroon, CSFA   ANESTHESIA:   general  EBL:  Total I/O In: 3000 [I.V.:3000] Out: 635 [Urine:520; Blood:115]  BLOOD ADMINISTERED:none  DRAINS: 2 Chest Tube(s) in the right pleural space   LOCAL MEDICATIONS USED:  OTHER refer to anesthesia record  SPECIMEN:  Source of Specimen:  Right chest mass  DISPOSITION OF SPECIMEN:  PATHOLOGY  COUNTS:  YES  PLAN OF CARE: Admit to inpatient   PATIENT DISPOSITION:  PACU - hemodynamically stable.   Delay start of Pharmacological VTE agent (>24hrs) due to surgical blood loss or risk of bleeding: yes  FINDINGS: 12 cm mass densely adherent to RLL superior segment. Vascular pedicle from apical chest wall. 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

## 2016-01-23 NOTE — Interval H&P Note (Signed)
History and Physical Interval Note:  01/23/2016 8:11 AM  Mathew [name]  has presented today for surgery, with the diagnosis of RIGHT CHEST MASS  The various methods of treatment have been discussed with the patient and family. After consideration of risks, benefits and other options for treatment, the patient has consented to  Procedure(s): THORACOTOMY MAJOR (Right) RESECTION OF CHEST WALL MASS (Right) POSSIBLE LUNG RESECTION (Right) as a surgical intervention .  The patient's history has been reviewed, patient examined, no change in status, stable for surgery.  I have reviewed the patient's chart and labs.  Questions were answered to the patient's satisfaction.     [name]

## 2016-01-23 NOTE — Anesthesia Preprocedure Evaluation (Signed)
Anesthesia Evaluation  Patient identified by MRN, date of birth, ID band Patient awake    Reviewed: Allergy & Precautions, NPO status , Patient's Chart, lab work & pertinent test results  Airway Mallampati: II  TM Distance: >3 FB Neck ROM: Full    Dental  (+) Teeth Intact   Pulmonary shortness of breath,    breath sounds clear to auscultation       Cardiovascular negative cardio ROS   Rhythm:Regular     Neuro/Psych negative neurological ROS  negative psych ROS   GI/Hepatic GERD  Controlled,  Endo/Other  negative endocrine ROS  Renal/GU negative Renal ROS     Musculoskeletal  (+) Arthritis ,   Abdominal   Peds  Hematology negative hematology ROS (+)   Anesthesia Other Findings   Reproductive/Obstetrics                             Anesthesia Physical Anesthesia Plan  ASA: II  Anesthesia Plan: General and Epidural   Post-op Pain Management:    Induction: Intravenous  Airway Management Planned: Double Lumen EBT  Additional Equipment: None  Intra-op Plan:   Post-operative Plan: Extubation in OR  Informed Consent: I have reviewed the patients History and Physical, chart, labs and discussed the procedure including the risks, benefits and alternatives for the proposed anesthesia with the patient or authorized representative who has indicated his/her understanding and acceptance.   Dental advisory given  Plan Discussed with: CRNA and Surgeon  Anesthesia Plan Comments:         Anesthesia Quick Evaluation

## 2016-01-23 NOTE — Progress Notes (Signed)
Patient ID: Mathew [name], male   DOB: Feb 08, 1961, 55 y.o.   MRN: [id number]   SICU Evening Rounds:   Hemodynamically stable  sats 99%  Urine output good  CT output low  CBC    Component Value Date/Time   WBC 9.0 01/21/2016 1613   RBC 4.39 01/21/2016 1613   HGB 12.7 (L) 01/21/2016 1613   HCT 40.1 01/21/2016 1613   PLT 205 01/21/2016 1613   MCV 91.3 01/21/2016 1613   MCH 28.9 01/21/2016 1613   MCHC 31.7 01/21/2016 1613   RDW 14.3 01/21/2016 1613   LYMPHSABS 0.7 11/24/2006 0255   MONOABS 0.7 11/24/2006 0255   EOSABS 0.1 11/24/2006 0255   BASOSABS 0.0 11/24/2006 0255     BMET    Component Value Date/Time   NA 137 01/21/2016 1613   K 4.0 01/21/2016 1613   CL 105 01/21/2016 1613   CO2 24 01/21/2016 1613   GLUCOSE 83 01/21/2016 1613   BUN 15 01/21/2016 1613   CREATININE 1.21 01/21/2016 1613   CALCIUM 8.8 (L) 01/21/2016 1613   GFRNONAA >60 01/21/2016 1613   GFRAA >60 01/21/2016 1613     A/P:  Stable postop course. Continue current plans

## 2016-01-23 NOTE — Op Note (Signed)
NAME:  Mathew [name], Mathew [name] NO.:  [id number]  MEDICAL RECORD NO.:  [id number]  LOCATION:  2S02C                        FACILITY:  [facility]  PHYSICIAN:  Revonda Standard. Roxan Hockey, M.D.DATE OF BIRTH:  22-Jan-1961  DATE OF PROCEDURE:  01/23/2016 DATE OF DISCHARGE:                              OPERATIVE REPORT   PREOPERATIVE DIAGNOSIS:  Right chest mass.  POSTOPERATIVE DIAGNOSIS:  Right chest wall mass.  PROCEDURES:  Right thoracotomy. Resection of chest wall mass with en bloc wedge resection of right lower lobe.  SURGEON:  Revonda Standard. Roxan Hockey, M.D.  ASSISTANT:  Valentina Gu, CSFA.  ANESTHESIA:  General.  FINDINGS:  A 12-cm mass in chest with dense adhesions to the lung, and a vascularized pedicle from chest wall at the apex.  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  CLINICAL NOTE:  Mathew [name] is a 55 year old lifelong non-smoker who recently developed wheezing with exertion.  Workup showed a right chest mass.  CT showed a 12-cm mass, which was unclear whether it was in the right lower lobe or was displacing the right lower lobe.  A CT-guided biopsy was performed.  It showed mostly necrosis.  A PET-CT showed low level uptake with central necrosis.  There was no adenopathy.  Bronchoscopy and endobronchial ultrasound were nondiagnostic.  The patient was advised to undergo thoracotomy for resection. It was unclear if lung resection would be required at the same setting.  The indications, risks, benefits, alternatives and intraoperative decision making were discussed in detail with the patient.  He understood and accepted the risks and agreed to proceed.  OPERATIVE NOTE:  Mathew [name] had a central line, arterial blood pressure monitoring line and thoracic epidural placed by Anesthesia in the preoperative holding area.  He then was taken to the operating room, anesthetized and intubated with a double-lumen endotracheal  tube. Sequential compression devices were placed for DVT prophylaxis.  A Foley catheter was placed.  Intravenous antibiotics were administered.  He was placed in a left lateral decubitus position and the right chest was prepped and draped in usual sterile fashion.  Single lung ventilation of the left lung was initiated and was tolerated well throughout the procedure.  An incision was made in the seventh intercostal space in the midaxillary line.  A 5-mm port was inserted into the right chest and the thoracoscope was advanced into the chest.  There was a large mass Posteriorly. It appeared to be reasonably well encapsulated and not within the lung parenchyma.  An incision was made laterally in the chest wall.  A muscle-sparing thoracotomy was made.  A small portion of latissimus was divided, but the serratus was preserved completely.  The superior rib was cut and a retractor was placed.  Initially with the dissection, there were some dense adhesions along the superior segment of the lower lobe and it was unclear if the mass was arising from the lower lobe.  A wedge of the lower lobe was taken with the mass, this was a relatively small rim of lung tissue in the area where there were severe adhesions. There also were some adhesions near the apex and the stapler was used to divide those as well,  they appeared vascularized.  As the dissection progressed, it became clear that this mass arose from the chest wall, as there was a vascularized pedicle originating from the apical chest wall supplying the tumor.  This was initially divided with an endoscopic stapler to allow the remainder of the mass to be mobilized.  The mass was appeared to be well encapsulated with the exception of the adhesions that had been taken down previously.  Once the mass was completely freed up, it was placed into an endoscopic retrieval bag and removed through the incision.  It initially would not come through the  incision.  While inside the bag, the mass was cut, which then allowed the mass to be removed through the chest wall.  It was sent for frozen section, which returned showing spindle cell tumor with benign features.  The instruments that were used to cut the tumor and gloves were changed after the tumor was removed from the chest.  Attention then was turned back to the vascularized pedicle at the apical chest wall, and this was felt to be a potential source for recurrence.  It was resected from the chest wall with a Harmonic scalpel.  There was bleeding in the area and pressure was applied as well as clips and the bleeding ultimately subsided.  Chest was copiously irrigated with saline.  A test inflation showed no leakage from the staple lines.  There was good expansion of all three lobes of the lung.  A 28-French chest tube was placed through an anterior port incision and a 28-French Blake drain was placed through the original port incision.  These were secured to skin with #1 silk sutures.  The chest wall was closed using #2 Vicryl sutures through the rib to reapproximated the ribs.  The serratus was reattached with a running #1 Vicryl suture.  The latissimus was closed with a running #1 Vicryl suture.  The subcutaneous tissue and skin were closed in standard fashion.  The chest tubes were placed to suction.  The patient was placed back in a supine position.  He was extubated in the operating room and taken to the postanesthetic care unit in good condition.     Revonda Standard Roxan Hockey, M.D.     SCH/MEDQ  D:  01/23/2016  T:  01/23/2016  Job:  [id number]

## 2016-01-23 NOTE — H&P (Signed)
[address]       [place] [address]             [phone]                                                           HPI: 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 He was in his usual state of health until January when he felt ill. He initially complained of decreased energy and some wheezing with exertion. He never had much of a cough and denies any hemoptysis. He thought he had a virus. He developed some other symptoms including swelling in his knees and ankles and was referred to rheumatology. The rheumatologist noticed some clubbing of his nails and did a chest x-ray. That showed a right lung mass. That led to a CT which showed a 12 cm mass in the right lower lobe. A CT-guided biopsy showed mostly necrosis. There were a few spindle cells noted but they were not particularly atypical.  A PET CT was done more recently which showed low-grade uptake with central necrosis. There was no hilar or mediastinal adenopathy.  I did bronchoscopy and endobronchial ultrasound on 12/17/2015. There was no endobronchial mass. With endobronchial ultrasound we were able to see the mass clearly a rotating as the bronchus intermedius but multiple samples came back negative for tumor. All the transbronchial biopsies showed benign lung tissue. He tolerated the procedure well. He does have some cough and mucus production.  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       Past Medical History  Diagnosis Date  . Clubbing of nails   . Vitamin D deficiency   . GERD (gastroesophageal reflux disease)   . Deafness in left ear   . Asthma     asthma as a child            Current Outpatient Prescriptions  Medication Sig Dispense Refill  . Ascorbic Acid (VITAMIN C) 1000 MG tablet Take 1,000 mg by mouth daily.    . Cholecalciferol (VITAMIN  D3) 5000 units TABS Take 1 tablet by mouth daily.    . Iodine, Kelp, (KELP PO) Take 500 mg by mouth daily.    . Probiotic Product (PROBIOTIC DAILY PO) Take 1 capsule by mouth daily.    . Zinc 100 MG TABS Take 1 tablet by mouth daily.     No current facility-administered medications for this visit.    Physical Exam BP 170/90 mmHg  Pulse 63  Resp 16  Ht '5\' 8"'$  (1.727 m)  Wt 180 lb (81.647 kg)  BMI 27.38 kg/m2  SpO47 67% 55 year old man in no acute distress Well-developed and well-nourished Cardiac regular rate and rhythm normal S1 and S2 Lungs diminished breath sounds posteriorly on the right  Diagnostic Tests: Pathology results reviewed- nondiagnostic  Impression: 55 year old man with a 12 cm right chest mass of unknown etiology. A CT-guided biopsy and bronchoscopy and endobronchial ultrasound biopsies were all negative. Of note he did have some spindle cells seen on the CT-guided biopsy, which raises the possibility of a benign tumor, such as a Schwannoma, of chest wall origin. It also could be  a very low-grade well-differentiated adenocarcinoma of the lung.  I discussed 2 different options with [name] on today. We discussed having a repeat CT-guided biopsy done. Now that the PET scan has been done there is a better chance of getting a good sample. However there also is still a possibility that it will be nondiagnostic. The second option, which I favor, is to proceed with surgery. We'll plan to do a right thoracotomy but start with a small incision and take a biopsy of the lesion. If benign we can probably decompressed the tumor to a large extent and then resected in a more minimally invasive fashion. If malignant he would need a full thoracotomy.  I discussed the proposed operation which is right thoracotomy, resection of chest wall mass, possible lung resection with [name]. They understand the nature of the procedure, the intraoperative decision  making, the need for general anesthesia, the incisions be used, and the need for drainage tubes postoperatively. We discussed the expected hospital stay and overall recovery. I reviewed the indications, risks, benefits, and alternatives. They understand the risks include, but are not limited to death, MI, DVT, PE, bleeding, possible need for transfusion, infection, air leak, acute and/or chronic pain, as well as the possibility of other unforeseeable complications. They understand that we cannot give him any prognosis at this point as we still do not have a definitive diagnosis.  [name] is leaning towards proceeding with surgery without another biopsy. He is scheduled to compete in the [employer] in about 3 weeks. We will go and schedule surgery for the week after that on Wednesday, August 9.  If he wishes to have another CT-guided biopsy done prior to surgery we will schedule that for him.  Plan:  Right thoracotomy, resection of right chest mass, possible lung resection on Wednesday, 01/23/2016.  I spent 25 minutes face-to-face with [name] during this visit. [name], MD [facility] [phone]   No interval change since 12/21/15  Revonda Standard. Roxan Hockey, MD [facility] [phone]

## 2016-01-23 NOTE — Transfer of Care (Signed)
Immediate Anesthesia Transfer of Care Note  Patient: Mathew [name]  Procedure(s) Performed: Procedure(s): RESECTION OF CHEST WALL MASS, Left Muscle Sparing Thoracotomy, and Unblocked wedge resection right  lower lobe (Right)  Patient Location: PACU  Anesthesia Type:General  Level of Consciousness: awake, alert , oriented and patient cooperative  Airway & Oxygen Therapy: Patient Spontanous Breathing and Patient connected to nasal cannula oxygen  Post-op Assessment: Report given to RN and Post -op Vital signs reviewed and stable  Post vital signs: Reviewed and stable  Last Vitals:  Vitals:   01/23/16 0713 01/23/16 1345  BP: (!) 152/107 108/62  Pulse: 72 (!) 43  Resp: 18 20  Temp: 36.6 C 36.2 C    Last Pain:  Vitals:   01/23/16 0713  TempSrc: Oral      Patients Stated Pain Goal: 2 (50/41/36 4383)  Complications: No apparent anesthesia complications

## 2016-01-23 NOTE — Anesthesia Procedure Notes (Signed)
Epidural Patient location during procedure: pre-op  Staffing Anesthesiologist: Oleta Mouse Performed: anesthesiologist   Preanesthetic Checklist Completed: patient identified, site marked, surgical consent, pre-op evaluation, timeout performed, IV checked, risks and benefits discussed and monitors and equipment checked  Epidural Patient position: sitting Prep: ChloraPrep Patient monitoring: heart rate, cardiac monitor, blood pressure and continuous pulse ox Approach: midline Location: thoracic (1-12) Injection technique: LOR saline  Needle:  Needle type: Tuohy  Needle gauge: 17 G Needle length: 9 cm Needle insertion depth: 8 cm Catheter type: closed end flexible Catheter size: 19 Gauge Catheter at skin depth: 15 cm Test dose: negative and 1.5% lidocaine with Epi 1:200 K  Additional Notes Reason for block:post-op pain management

## 2016-01-23 NOTE — Progress Notes (Signed)
Dr. Cyndia Bent notified about pt's hypotension and decreased UOP. Orders received for NS bolus and two albumin. Will implement and continue to monitor closely.

## 2016-01-24 ENCOUNTER — Encounter (HOSPITAL_COMMUNITY): Payer: Self-pay | Admitting: Thoracic Surgery (Cardiothoracic Vascular Surgery)

## 2016-01-24 ENCOUNTER — Inpatient Hospital Stay (HOSPITAL_COMMUNITY): Payer: BLUE CROSS/BLUE SHIELD

## 2016-01-24 ENCOUNTER — Encounter (HOSPITAL_COMMUNITY): Payer: Self-pay | Admitting: Anesthesiology

## 2016-01-24 LAB — CBC
HCT: 35.8 % — ABNORMAL LOW (ref 39.0–52.0)
Hemoglobin: 11.2 g/dL — ABNORMAL LOW (ref 13.0–17.0)
MCH: 28.9 pg (ref 26.0–34.0)
MCHC: 31.3 g/dL (ref 30.0–36.0)
MCV: 92.5 fL (ref 78.0–100.0)
PLATELETS: 200 10*3/uL (ref 150–400)
RBC: 3.87 MIL/uL — AB (ref 4.22–5.81)
RDW: 14.8 % (ref 11.5–15.5)
WBC: 15.5 10*3/uL — ABNORMAL HIGH (ref 4.0–10.5)

## 2016-01-24 LAB — BASIC METABOLIC PANEL
Anion gap: 7 (ref 5–15)
BUN: 16 mg/dL (ref 6–20)
CALCIUM: 8.4 mg/dL — AB (ref 8.9–10.3)
CO2: 27 mmol/L (ref 22–32)
CREATININE: 1.18 mg/dL (ref 0.61–1.24)
Chloride: 101 mmol/L (ref 101–111)
GFR calc Af Amer: >60 mL/min (ref >60)
Glucose, Bld: 119 mg/dL — ABNORMAL HIGH (ref 65–99)
POTASSIUM: 4.4 mmol/L (ref 3.5–5.1)
SODIUM: 135 mmol/L (ref 135–145)

## 2016-01-24 LAB — POCT I-STAT 3, ART BLOOD GAS (G3+)
Bicarbonate: 27.1 mEq/L — ABNORMAL HIGH (ref 20.0–24.0)
O2 Saturation: 94 %
PCO2 ART: 52.4 mmHg — AB (ref 35.0–45.0)
PO2 ART: 75 mmHg — AB (ref 80.0–100.0)
TCO2: 29 mmol/L (ref 0–100)
pH, Arterial: 7.319 — ABNORMAL LOW (ref 7.350–7.450)

## 2016-01-24 MED ORDER — ALBUMIN HUMAN 5 % IV SOLN
12.5000 g | Freq: Once | INTRAVENOUS | Status: AC
  Administered 2016-01-24: 12.5 g via INTRAVENOUS

## 2016-01-24 MED ORDER — PHENYLEPHRINE HCL 10 MG/ML IJ SOLN
0.0000 ug/min | INTRAVENOUS | Status: DC
  Administered 2016-01-24: 20 ug/min via INTRAVENOUS
  Filled 2016-01-24: qty 1

## 2016-01-24 MED ORDER — ENOXAPARIN SODIUM 40 MG/0.4ML ~~LOC~~ SOLN: 40.0000 mg | SUBCUTANEOUS | Status: DC

## 2016-01-24 NOTE — Anesthesia Post-op Follow-up Note (Signed)
  Anesthesia Pain Follow-up Note  Patient: Mathew [name]  Day #: 1  Date of Follow-up: 01/24/2016 Time: 1:52 PM  Last Vitals:  Vitals:   01/24/16 1000 01/24/16 1038  BP:    Pulse:    Resp: (!) 24   Temp:  36.7 C    Level of Consciousness: alert  Pain: mild, level tested and appropriate for block  Side Effects:None  Catheter Site Exam:clean, dry, no drainage  Epidural / Intrathecal    Start     Dose/Rate Route Frequency Ordered Stop   01/23/16 1545  ropivacaine (PF) 2 mg/ml (0.2%) (NAROPIN) epidural     8 mL/hr 8 mL/hr  Epidural Continuous 01/23/16 1517         Plan: Continue current therapy. Will bolus patient 44m to improve pain control and maintain current infusion rate. Plan to continue until chest tube removed.   Tanikka Bresnan

## 2016-01-24 NOTE — Progress Notes (Addendum)
1 Day Post-Op Procedure(s) (LRB): RESECTION OF CHEST WALL MASS, Left Muscle Sparing Thoracotomy, and Unblocked wedge resection right  lower lobe (Right) Subjective: Some incisional pain Hypotension overnight  Objective: Vital signs in last 24 hours: Temp:  [97.2 F (36.2 C)-98.6 F (37 C)] 98.2 F (36.8 C) (08/10 0733) Pulse Rate:  [38-59] 50 (08/10 0800) Cardiac Rhythm: Sinus bradycardia (08/10 0800) Resp:  [10-26] 17 (08/10 0800) BP: (80-125)/(42-80) 125/79 (08/10 0800) SpO2:  [97 %-100 %] 99 % (08/10 0800) Arterial Line BP: (70-151)/(32-80) 143/61 (08/10 0800) Weight:  [177 lb (80.3 kg)] 177 lb (80.3 kg) (08/09 1625)  Hemodynamic parameters for last 24 hours:    Intake/Output from previous day: 08/09 0701 - 08/10 0700 In: 7385.8 [P.O.:720; I.V.:5043.8; IV Piggyback:1500] Out: 2290 [Urine:1705; Blood:115; Chest Tube:470] Intake/Output this shift: No intake/output data recorded.  General appearance: alert, cooperative and no distress Neurologic: intact Heart: regular rate and rhythm Lungs: diminished breath sounds bibasilar Abdomen: normal findings: soft, non-tender no air leak  Lab Results:  Recent Labs  01/23/16 2130 01/24/16 0345  WBC 16.1* 15.5*  HGB 11.8* 11.2*  HCT 38.5* 35.8*  PLT 204 200   BMET:  Recent Labs  01/23/16 2130 01/24/16 0345  NA 132* 135  K 5.2* 4.4  CL 103 101  CO2 26 27  GLUCOSE 171* 119*  BUN 14 16  CREATININE 1.13 1.18  CALCIUM 7.9* 8.4*    PT/INR:  Recent Labs  01/21/16 1613  LABPROT 15.5*  INR 1.23   ABG    Component Value Date/Time   PHART 7.319 (L) 01/24/2016 0351   HCO3 27.1 (H) 01/24/2016 0351   TCO2 29 01/24/2016 0351   O2SAT 94.0 01/24/2016 0351   CBG (last 3)  No results for input(s): GLUCAP in the last 72 hours.  Assessment/Plan: S/P Procedure(s) (LRB): RESECTION OF CHEST WALL MASS, Left Muscle Sparing Thoracotomy, and Unblocked wedge resection right  lower lobe (Right) POD # 1  Doing well  Some  hypotension overnight likely related to thoracic epidural. BP better this AM Creatinine and electrolytes OK CT to water seal Mobilize SCD + enoxaparin for DVT prophylaxis Transfer to 3 S when bed available   LOS: 1 day    [name] 01/24/2016  Addendum  Has thoracic epidural in place. Due to risk of hematoma will not start enoxaparin. SCD = ambulation for DVT prophylaxis  Remo Lipps C. Roxan Hockey, MD [facility] [phone]

## 2016-01-24 NOTE — Care Management Note (Signed)
Case Management Note  Patient Details  Name: SAW MENDENHALL MRN: [id number] Date of Birth: 12/31/1960  Subjective/Objective:   Patient from home, s/p vats , wedge resection with chest tube,  NCM will cont to follow for dc needs.                 Action/Plan:   Expected Discharge Date:                  Expected Discharge Plan:  [place]  In-House Referral:     Discharge planning Services  CM Consult  Post Acute Care Choice:    Choice offered to:     DME Arranged:    DME Agency:     HH Arranged:    [place] Agency:     Status of Service:  In process, will continue to follow  If discussed at Long Length of Stay Meetings, dates discussed:    Additional Comments:  Zenon Mayo, RN 01/24/2016, 4:47 PM

## 2016-01-24 NOTE — Progress Notes (Signed)
Patient was transferred to 3S with monitor on. Vital signs were stable. Report was given to the nurse by Nira Conn, RN.

## 2016-01-24 NOTE — Progress Notes (Signed)
Patient tx to 3S15, receiving RN at bedside, patient stable in chair with wife at bedside. No questions from receiving RN or patient.  Rowe Pavy, RN

## 2016-01-24 NOTE — Anesthesia Postprocedure Evaluation (Signed)
Anesthesia Post Note  Patient: Mathew [name]  Procedure(s) Performed: Procedure(s) (LRB): RESECTION OF CHEST WALL MASS, Left Muscle Sparing Thoracotomy, and Unblocked wedge resection right  lower lobe (Right)  Patient location during evaluation: PACU Anesthesia Type: General and Epidural Level of consciousness: awake Pain management: pain level controlled Vital Signs Assessment: post-procedure vital signs reviewed and stable Respiratory status: spontaneous breathing Cardiovascular status: stable Postop Assessment: no signs of nausea or vomiting Anesthetic complications: no    Last Vitals:  Vitals:   01/24/16 1000 01/24/16 1038  BP:    Pulse:    Resp: (!) 24   Temp:  36.7 C    Last Pain:  Vitals:   01/24/16 1038  TempSrc: Oral  PainSc: 0-No pain                 Laveyah Oriol

## 2016-01-24 NOTE — Progress Notes (Signed)
Pt arrived from 3S-VSS-no c/o pain or signs of distress. Family at bedside. CMT notified-will continue to monitor.

## 2016-01-25 ENCOUNTER — Inpatient Hospital Stay (HOSPITAL_COMMUNITY): Payer: BLUE CROSS/BLUE SHIELD

## 2016-01-25 LAB — COMPREHENSIVE METABOLIC PANEL
ALBUMIN: 2.7 g/dL — AB (ref 3.5–5.0)
ALK PHOS: 161 U/L — AB (ref 38–126)
ALT: 22 U/L (ref 17–63)
AST: 22 U/L (ref 15–41)
Anion gap: 8 (ref 5–15)
BILIRUBIN TOTAL: 0.7 mg/dL (ref 0.3–1.2)
BUN: 12 mg/dL (ref 6–20)
CALCIUM: 8.3 mg/dL — AB (ref 8.9–10.3)
CO2: 27 mmol/L (ref 22–32)
Chloride: 102 mmol/L (ref 101–111)
Creatinine, Ser: 1.18 mg/dL (ref 0.61–1.24)
GFR calc Af Amer: >60 mL/min (ref >60)
GFR calc non Af Amer: >60 mL/min (ref >60)
GLUCOSE: 91 mg/dL (ref 65–99)
Potassium: 4.2 mmol/L (ref 3.5–5.1)
SODIUM: 137 mmol/L (ref 135–145)
TOTAL PROTEIN: 4.9 g/dL — AB (ref 6.5–8.1)

## 2016-01-25 LAB — CBC
HEMATOCRIT: 35.5 % — AB (ref 39.0–52.0)
HEMOGLOBIN: 11 g/dL — AB (ref 13.0–17.0)
MCH: 29 pg (ref 26.0–34.0)
MCHC: 31 g/dL (ref 30.0–36.0)
MCV: 93.7 fL (ref 78.0–100.0)
Platelets: 179 10*3/uL (ref 150–400)
RBC: 3.79 MIL/uL — AB (ref 4.22–5.81)
RDW: 15.1 % (ref 11.5–15.5)
WBC: 9.6 10*3/uL (ref 4.0–10.5)

## 2016-01-25 NOTE — Care Management Note (Signed)
Case Management Note  Patient Details  Name: GRAYLING SCHRANZ MRN: [id number] Date of Birth: [birth year]  Subjective/Objective:    Patient from home with spouse, s/p vats wedge resection with chest tubes x 2, one dc'd today.   PTA indep.  NCM will cont to follow for dc needs.              Action/Plan:   Expected Discharge Date:                  Expected Discharge Plan:  [place]  In-House Referral:     Discharge planning Services  CM Consult  Post Acute Care Choice:    Choice offered to:     DME Arranged:    DME Agency:     HH Arranged:    [place] Agency:     Status of Service:  Completed, signed off  If discussed at [employer] of Stay Meetings, dates discussed:    Additional Comments:  Zenon Mayo, RN 01/25/2016, 5:06 PM

## 2016-01-25 NOTE — Discharge Instructions (Signed)
Thoracotomy, Care After  These instructions give you information on caring for yourself after your procedure. Your doctor may also give you more specific instructions. Call your doctor if you have any problems or questions after your procedure. HOME CARE  Only take medicine as told by your doctor. Take pain medicine before your pain gets very bad.  Take deep breaths to protect yourself from a lung infection (pneumonia).  Cough often to clear thick spit (mucus) and to open your lungs. Hold a pillow against your chest if it hurts to cough.  Use your breathing machine (incentive spirometer) as told.  Change bandages (dressings) as told by your doctor.  Take off the bandage over your chest tube area as told by your doctor.  Continue your normal diet as told by your doctor.  Eat high-fiber foods. This includes whole grain cereals, brown rice, beans, and fresh fruits and vegetables.  Drink enough fluids to keep your pee (urine) clear or pale yellow. Avoid caffeine.  Talk to your doctor about taking a medicine to soften your poop (stool softener, laxative).  Keep doing activities as told by your doctor. Balance your activity with rest.  Avoid lifting until your doctor says it is okay.  Do not drive until told by your doctor. Do not drive while taking pain medicines (narcotics).  Do not bathe, swim, or use a hot tub until told by your doctor. You may shower. Gently wash the area of your cut (incision) with soap and water as told.  Do not use any tobacco products including cigarettes, chewing tobacco, and electronic cigarettes. Avoid being around others who smoke.  Schedule a doctor visit to get your stitches or staples removed as told.  Schedule and go to all doctor visits as told.  Go to therapy that can help improve your lungs (pulmonary rehabilitation) as told by your doctor.  Do not travel by airplane for 2 weeks after the chest tube is taken out. GET HELP IF:  You are bleeding  from your wounds.  You have redness, puffiness (swelling), or more pain in the wounds.  You feel your heart beating fast or skipping beats (irregular heartbeat).  You have yellowish-white fluid (pus) coming from your wounds.  You have a bad smell coming from the wound or bandage.  You have a fever or chills.  You feel sick to your stomach or you throw up (vomit).  You have muscle aches. GET HELP RIGHT AWAY IF:  You have a rash.  You have trouble breathing.  Your medicines are causing you problems.  You keep feeling sick to your stomach (nauseous).  You feel light-headed.  You have shortness of breath or chest pain.  You have pain that will not go away. MAKE SURE YOU:  Understand these instructions.  Will watch your condition.  Will get help right away if you are not doing well or get worse.   This information is not intended to replace advice given to you by your health care provider. Make sure you discuss any questions you have with your health care provider.   Document Released: 12/02/2011 Document Revised: 06/23/2014 Document Reviewed: 01/19/2013 Elsevier Interactive Patient Education [employer].

## 2016-01-25 NOTE — Progress Notes (Addendum)
      [address]       [place] [address]             [phone]       2 Days Post-Op Procedure(s) (LRB): RESECTION OF CHEST WALL MASS, Left Muscle Sparing Thoracotomy, and Unblocked wedge resection right  lower lobe (Right)  Subjective: Patient without complaints this am.  Objective: Vital signs in last 24 hours: Temp:  [97.8 F (36.6 C)-98.2 F (36.8 C)] 98.2 F (36.8 C) (08/11 0450) Pulse Rate:  [47-61] 60 (08/11 0450) Cardiac Rhythm: Normal sinus rhythm (08/11 0450) Resp:  [14-24] 16 (08/11 0450) BP: (106-143)/(63-97) 125/70 (08/11 0450) SpO2:  [98 %-100 %] 98 % (08/11 0450) Arterial Line BP: (143)/(61) 143/61 (08/10 0800)     Intake/Output from previous day: 08/10 0701 - 08/11 0700 In: 1402.3 [I.V.:1258.3] Out: 2525 [Urine:2075; Chest Tube:450]   Physical Exam:  Cardiovascular: RRR Pulmonary: Slightly diminished right base;otherwise, clear Abdomen: Soft, non tender, bowel sounds present. Extremities: No lower extremity edema. Wounds: Clean and dry.  No erythema or signs of infection. Chest Tubes: To water seal, no air leak  Lab Results: CBC: Recent Labs  01/24/16 0345 01/25/16 0527  WBC 15.5* 9.6  HGB 11.2* 11.0*  HCT 35.8* 35.5*  PLT 200 179   BMET:  Recent Labs  01/24/16 0345 01/25/16 0527  NA 135 137  K 4.4 4.2  CL 101 102  CO2 27 27  GLUCOSE 119* 91  BUN 16 12  CREATININE 1.18 1.18  CALCIUM 8.4* 8.3*    PT/INR: No results for input(s): LABPROT, INR in the last 72 hours. ABG:  INR: Will add last result for INR, ABG once components are confirmed Will add last 4 CBG results once components are confirmed  Assessment/Plan:  1. CV - SB in the 40-50's (which has been pos op). 2.  Pulmonary - On room air. Chest tube with 450 cc of output last 24 hours. Chest tube is to water seal. CXR this am shows small right apical pneumothorax, right atelectasis, stable cardiomegaly. Will remove ANTERIOR chest tube. Encourage  incentive spirometer. 3. Has epidural so no Lovenox for now. 4. H and H stable at 11 and 35.5.  ZIMMERMAN,DONIELLE MPA-C 01/25/2016,7:55 AM  Patient seen and examined, agree with above Looks great Path pending Dc anterior CT Dc posterior CT when drainage < 250 ml/24 hours Dc Foley Continue ambulation  Remo Lipps C. Roxan Hockey, MD [facility] [phone]

## 2016-01-25 NOTE — Anesthesia Post-op Follow-up Note (Signed)
  Anesthesia Pain Follow-up Note  Patient: Mathew [name]  Day #: 2  Date of Follow-up: 01/25/2016 Time: 11:10 AM  Last Vitals:  Vitals:   01/25/16 0744 01/25/16 1046  BP: 121/63   Pulse: (!) 50   Resp: 15   Temp: 36.9 C 36.8 C    Level of Consciousness: alert  Pain: 0 /10   Side Effects:Pruritis  Catheter Site Exam:clean, dry, no drainage  Epidural / Intrathecal    Start     Dose/Rate Route Frequency Ordered Stop   01/23/16 1545  ropivacaine (PF) 2 mg/ml (0.2%) (NAROPIN) epidural     8 mL/hr 8 mL/hr  Epidural Continuous 01/23/16 1517         Plan: Continue current therapy. Plan on removing once all chest tubes removed.  Tiajuana Amass

## 2016-01-25 NOTE — Discharge Summary (Signed)
Physician Discharge Summary       [address]       [place] [address]             [phone]    Patient ID: Mathew [name] MRN: [id number] DOB/AGE: 02-11-1961 55 y.o.  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 Roxan Hockey on 01/23/2016.  Pathology: Results pending.  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 He was in his usual state of health until January when he felt ill. He initially complained of decreased energy and some wheezing with exertion. He never had much of a cough and denies any hemoptysis. He thought he had a virus. He developed some other symptoms including swelling in his knees and ankles and was referred to rheumatology. The rheumatologist noticed some clubbing of his nails and did a chest x-ray. That showed a right lung mass. That led to a CT which showed a 12 cm mass in the right lower lobe. A CT-guided biopsy showed mostly necrosis. There were a few spindle cells noted but they were not particularly atypical.  A PET CT was done more recently which showed low-grade uptake with central necrosis. There was no hilar or mediastinal adenopathy.  I did bronchoscopy and endobronchial ultrasound on 12/17/2015. There was no endobronchial mass. With endobronchial ultrasound we were able to see the mass clearly a rotating as the bronchus intermedius but multiple samples came back negative for tumor. All the transbronchial biopsies showed benign lung tissue. He tolerated the procedure well. He does have some cough and mucus production.  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  Dr. Roxan Hockey discussed 2 different options with Mr. and Mrs. [name] on today. We discussed having a repeat CT-guided biopsy done. Now that the PET scan has been done there is a better chance of getting a good sample. However there also is still a possibility that it will be nondiagnostic. The second option, which Dr. Roxan Hockey favors, is to proceed with surgery. He will plan to do a right thoracotomy but start with a small incision and take a biopsy of the lesion. If benign we can probably decompressed the tumor to a large extent and then resected in a more minimally invasive fashion. If malignant, he would need a full thoracotomy.  Dr. Roxan Hockey discussed the proposed operation which is right thoracotomy, resection of chest wall mass, possible lung resection with Mr. and Mathew [name]. They understand the nature of the procedure, the intraoperative decision making, the need for general anesthesia, the incisions be used, and the need for drainage tubes postoperatively. Dr. Roxan Hockey discussed the expected hospital stay and overall recovery. I reviewed the indications, risks, benefits, and alternatives. Patient was admitted to [facility] on 01/23/2016 in order to undergo a right thoracotomy, resection of chest wall mass with en bloc resection of RLL.  Brief Hospital Course:  A line and foley were removed early in his post operative course. Chest tube output gradually decreased. Chest tubes were placed to water seal on post operative day one. There was no air leak. Daily chest x rays were obtained and remained stable (small right apical pneumothorax). The anterior chest  tube was removed on 01/25/2016. The remaining chest tube was removed on 01/27/2016.  Follow up CXR showed stable appearance of pneumothorax.  His epidural was removed.  He is ambulating on room air. He is tolerating a diet.    Latest Vital Signs: Blood pressure (!) 141/82, pulse (!) 57, temperature 98.7 F (37.1 C),  temperature source Oral, resp. rate 15, height '5\' 9"'$  (1.753 m), weight 177 lb (80.3 kg), SpO2 100 %.  Physical Exam: Cardiovascular: RRR Pulmonary: Slightly diminished right base;otherwise, clear Abdomen: Soft, non tender, bowel sounds present. Extremities: No lower extremity edema. Wounds: Clean and dry.  No erythema or signs of infection. Chest Tubes: To water seal, no air leak  Discharge Condition:Stable and discharged to home.  Recent laboratory studies:  Lab Results  Component Value Date   WBC 9.6 01/25/2016   HGB 11.0 (L) 01/25/2016   HCT 35.5 (L) 01/25/2016   MCV 93.7 01/25/2016   PLT 179 01/25/2016   Lab Results  Component Value Date   NA 137 01/25/2016   K 4.2 01/25/2016   CL 102 01/25/2016   CO2 27 01/25/2016   CREATININE 1.18 01/25/2016   GLUCOSE 91 01/25/2016    Diagnostic Studies:  Dg Chest Port 1 View  Result Date: 01/25/2016 CLINICAL DATA:  Chest wall mass. EXAM: PORTABLE CHEST 1 VIEW COMPARISON:  01/24/2016 . FINDINGS: Right chest tubes noted in stable position. Small right apical pneumothorax. Cardiomegaly. No pulmonary venous congestion. Right perihilar atelectasis and/or infiltrate unchanged. No pleural effusion. Surgical clips noted over the right upper chest. IMPRESSION: 1. Right chest tubes in stable position. Postsurgical changes right upper chest. Small right apical pneumothorax is present. 2.  Right perihilar mild atelectasis. 3. Stable cardiomegaly.  No pulmonary venous congestion . Critical Value/emergent results were called by telephone at the time of interpretation on 01/25/2016 at 7:45 am to nurse Jarrett Soho , who verbally acknowledged these results. Electronically Signed   By: Marcello Moores   Discharge Medications:   Medication List    STOP taking these medications   predniSONE 10 MG (48) Tbpk tablet Commonly known as:  STERAPRED UNI-PAK 48 TAB     TAKE these medications   acetaminophen 500 MG tablet Commonly known as:  TYLENOL Take 2 tablets (1,000  mg total) by mouth every 6 (six) hours as needed for mild pain, fever or headache.   hydroxypropyl methylcellulose / hypromellose 2.5 % ophthalmic solution Commonly known as:  ISOPTO TEARS / GONIOVISC Place 1 drop into both eyes See admin instructions. Install 1 drop in both eyes three time a week as needed for dry eyes.  No specific days.   ibuprofen 800 MG tablet Commonly known as:  ADVIL,MOTRIN Take 1 tablet (800 mg total) by mouth every 6 (six) hours as needed for mild pain.   KELP PO Take 500 mg by mouth daily.   PROBIOTIC DAILY PO Take 1 capsule by mouth daily.   traMADol 50 MG tablet Commonly known as:  ULTRAM Take 1 tablet (50 mg total) by mouth every 6 (six) hours as needed for moderate pain.   vitamin C 1000 MG tablet Take 1,000 mg by mouth daily.   Vitamin D3 5000 units Tabs Take 1 tablet by mouth daily.   Zinc 100 MG Tabs Take 1 tablet by mouth daily.       Follow Up Appointments: Follow-up Information    [name], MD Follow up on 02/19/2016.   Specialty:  Cardiothoracic Surgery Why:  PA/LAT CXR to be taken (at [facility] which is in the same building as Dr. Leonarda Salon office) on 02/19/2016 at 12:00 pm;Appointment time is at 12:30 pm Contact information: [address] [place] [address] [phone]        Nurse Follow up on 02/08/2016.   Why:  Appointment is with nurse to have chest tube sutures removed. Appointment time is at 10:30 am Contact information: [place] [address]          Signed: Cinda Quest 01/27/2016, 1:35 PM

## 2016-01-25 NOTE — Progress Notes (Signed)
Removed CT per order. Pt tolerated well, no signs of distress, educated need for continued IS, cough, and deep breathing. Will continue to monitor.

## 2016-01-26 ENCOUNTER — Inpatient Hospital Stay (HOSPITAL_COMMUNITY): Payer: BLUE CROSS/BLUE SHIELD

## 2016-01-26 MED ORDER — IBUPROFEN 200 MG PO TABS: 800.0000 mg | ORAL_TABLET | Freq: Four times a day (QID) | ORAL | Status: DC | PRN

## 2016-01-26 MED ORDER — OXYCODONE HCL 5 MG PO TABS
2.5000 mg | ORAL_TABLET | Freq: Four times a day (QID) | ORAL | Status: DC | PRN
  Administered 2016-01-27: 5 mg via ORAL
  Filled 2016-01-26: qty 1

## 2016-01-26 NOTE — Progress Notes (Addendum)
      [address]       [place] [address]             [phone]      3 Days Post-Op Procedure(s) (LRB): RESECTION OF CHEST WALL MASS, Left Muscle Sparing Thoracotomy, and Unblocked wedge resection right  lower lobe (Right)  Subjective:  [name] is doing okay.  Having some pain this morning after not taking a dose of pain medicine overnight.  Also states he is groggy.  Objective: Vital signs in last 24 hours: Temp:  [97.7 F (36.5 C)-98.5 F (36.9 C)] 98.1 F (36.7 C) (08/12 0700) Pulse Rate:  [51-65] 51 (08/12 0700) Cardiac Rhythm: Sinus bradycardia (08/12 0332) Resp:  [14-19] 18 (08/12 0700) BP: (99-140)/(53-87) 140/87 (08/12 0700) SpO2:  [98 %-100 %] 99 % (08/12 0700)  Intake/Output from previous day: 08/11 0701 - 08/12 0700 In: 1589 [P.O.:1200; I.V.:245] Out: 2665 [Urine:2475; Chest Tube:190]  General appearance: alert, cooperative and no distress Heart: regular rate and rhythm Lungs: clear to auscultation bilaterally Abdomen: soft, non-tender; bowel sounds normal; no masses,  no organomegaly Extremities: extremities normal, atraumatic, no cyanosis or edema Wound: clean and dry  Lab Results:  Recent Labs  01/24/16 0345 01/25/16 0527  WBC 15.5* 9.6  HGB 11.2* 11.0*  HCT 35.8* 35.5*  PLT 200 179   BMET:  Recent Labs  01/24/16 0345 01/25/16 0527  NA 135 137  K 4.4 4.2  CL 101 102  CO2 27 27  GLUCOSE 119* 91  BUN 16 12  CREATININE 1.18 1.18  CALCIUM 8.4* 8.3*    PT/INR: No results for input(s): LABPROT, INR in the last 72 hours. ABG    Component Value Date/Time   PHART 7.319 (L) 01/24/2016 0351   HCO3 27.1 (H) 01/24/2016 0351   TCO2 29 01/24/2016 0351   O2SAT 94.0 01/24/2016 0351   CBG (last 3)  No results for input(s): GLUCAP in the last 72 hours.  Assessment/Plan: S/P Procedure(s) (LRB): RESECTION OF CHEST WALL MASS, Left Muscle Sparing Thoracotomy, and Unblocked wedge resection right  lower lobe (Right)  1.  Chest tube- 1+ air leak with cough, 190 cc output yesterday- leave chest tube in place today 2. Pulm- no acute issues, CXR with small increase in minimal apical pneumothorax... Continue IS 3. Pain control- will decrease OXY IR to 2.5-5.0 mg dose, will also add Ibuprofen prn... Patient does not like taking narcotics 4. Continued Epidural- no Lovenox 5. Dispo- patient stable, leave chest tube in place today, repeat CXR in AM   LOS: 3 days    [name], Mathew 01/26/2016   Passing urine without difficulty  Some scrotal penile swelling  Still with air leak , leave chest tube today I have seen and examined [name] and agree with the above assessment  and plan.  Mathew Isaac MD [facility] [phone] Office [phone] 01/26/2016 9:43 AM

## 2016-01-26 NOTE — Anesthesia Post-op Follow-up Note (Signed)
  Anesthesia Pain Follow-up Note  Patient: Mathew [name]  Day #: 3  Date of Follow-up: 01/26/2016 Time: 10:22 AM  Last Vitals:  Vitals:   01/26/16 0700 01/26/16 0900  BP: 140/87   Pulse: (!) 51   Resp: 18 17  Temp: 36.7 C     Level of Consciousness: alert  Pain: mild - 2  Side Effects:None  Catheter Site Exam:clean, dry, no drainage  Epidural / Intrathecal    Start     Dose/Rate Route Frequency Ordered Stop   01/23/16 1545  ropivacaine (PF) 2 mg/ml (0.2%) (NAROPIN) epidural     8 mL/hr 8 mL/hr  Epidural Continuous 01/23/16 1517         Plan: Continue current therapy  - OK to receive anticoagulants per guidelines (Prophylactic SQ Hep is ok) - will continue to follow - possible d/c on 8/13 if chest tube removed.    Effie Berkshire

## 2016-01-27 ENCOUNTER — Inpatient Hospital Stay (HOSPITAL_COMMUNITY): Payer: BLUE CROSS/BLUE SHIELD

## 2016-01-27 MED ORDER — IBUPROFEN 800 MG PO TABS: 800.0000 mg | ORAL_TABLET | Freq: Four times a day (QID) | ORAL | 1 refills | Status: DC | PRN

## 2016-01-27 MED ORDER — ACETAMINOPHEN 500 MG PO TABS: 1000.0000 mg | ORAL_TABLET | Freq: Four times a day (QID) | ORAL | 0 refills | Status: DC | PRN

## 2016-01-27 MED ORDER — TRAMADOL HCL 50 MG PO TABS: 50.0000 mg | ORAL_TABLET | Freq: Four times a day (QID) | ORAL | 0 refills | Status: DC | PRN

## 2016-01-27 NOTE — Progress Notes (Signed)
Chest tube removed as per order. Patient tolerated procedure well. No adverse events noted. Will cont to monitor.

## 2016-01-27 NOTE — Progress Notes (Addendum)
      [address]       [place] [address]             [phone]      4 Days Post-Op Procedure(s) (LRB): RESECTION OF CHEST WALL MASS, Left Muscle Sparing Thoracotomy, and Unblocked wedge resection right  lower lobe (Right)   Subjective:  Mathew [name] is feeling okay.  Most pain is by the chest tube site.  He is ambulating independently with his wife.  Objective: Vital signs in last 24 hours: Temp:  [97.9 F (36.6 C)-98.4 F (36.9 C)] 98 F (36.7 C) (08/13 0330) Pulse Rate:  [50-64] 51 (08/13 0810) Cardiac Rhythm: Sinus bradycardia (08/13 0330) Resp:  [16-25] 22 (08/13 0810) BP: (124-144)/(67-88) 139/86 (08/13 0810) SpO2:  [100 %] 100 % (08/13 0810)  Intake/Output from previous day: 08/12 0701 - 08/13 0700 In: 1170 [P.O.:720; I.V.:250] Out: 1060 [Urine:975; Chest Tube:85] Intake/Output this shift: Total I/O In: -  Out: 10 [Chest Tube:10]  General appearance: alert, cooperative and no distress Heart: regular rate and rhythm Lungs: clear to auscultation bilaterally Abdomen: soft, non-tender; bowel sounds normal; no masses,  no organomegaly Extremities: extremities normal, atraumatic, no cyanosis or edema Wound: clean and dry  Lab Results:  Recent Labs  01/25/16 0527  WBC 9.6  HGB 11.0*  HCT 35.5*  PLT 179   BMET:  Recent Labs  01/25/16 0527  NA 137  K 4.2  CL 102  CO2 27  GLUCOSE 91  BUN 12  CREATININE 1.18  CALCIUM 8.3*    PT/INR: No results for input(s): LABPROT, INR in the last 72 hours. ABG    Component Value Date/Time   PHART 7.319 (L) 01/24/2016 0351   HCO3 27.1 (H) 01/24/2016 0351   TCO2 29 01/24/2016 0351   O2SAT 94.0 01/24/2016 0351   CBG (last 3)  No results for input(s): GLUCAP in the last 72 hours.  Assessment/Plan: S/P Procedure(s) (LRB): RESECTION OF CHEST WALL MASS, Left Muscle Sparing Thoracotomy, and Unblocked wedge resection right  lower lobe (Right)  1. Chest tube- no air leak present, output 85 cc  yesterday- will d/c chest tube today 2. Pulm- CXR remains stable, no acute issues, will repeat at 12 today after chest tube removal 3. Pain control- d/c epidural after chest tube removal 4. Dispo- patient stable, will d/c chest tube today, repeat CXR if no change in pneumothorax will d/c home today   LOS: 4 days    BARRETT, ERIN 01/27/2016  No air leak this am, chest tube removed Epidural to come out today Follow up chest xray today and poss home this afternoon. I have seen and examined Mathew [name] and agree with the above assessment  and plan.  Grace Isaac MD Beeper [phone] Office [phone] 01/27/2016 10:30 AM

## 2016-01-27 NOTE — Progress Notes (Signed)
Pt seen. VSS Epidural catheter removed tip intact site OK. All questions answered.  Lillia Abed MD

## 2016-01-27 NOTE — Progress Notes (Signed)
Discharge instructions reviewed with patient and his wife who was at bedside. Prescription x 2 given to patient. Appointment information reviewed and patient verbalized understanding. Medication reviewed and patient aware of how and when to take medication. S/s of infection reviewed, site examined, and patient verbalized understanding of incisional care. Patient asked questions and all were answered to his satisfaction.  VSS. Pt in no acute distress. Patient discharged via wheelchair.

## 2016-01-28 ENCOUNTER — Telehealth: Payer: Self-pay | Admitting: Thoracic Surgery (Cardiothoracic Vascular Surgery)

## 2016-01-28 NOTE — Telephone Encounter (Signed)
Attempted to call with path results. No answer  Mathew [name]. Roxan Hockey, MD [facility] [phone]

## 2016-01-29 LAB — ACID FAST CULTURE WITH REFLEXED SENSITIVITIES: ACID FAST CULTURE - AFSCU3: NEGATIVE

## 2016-01-31 ENCOUNTER — Encounter (HOSPITAL_COMMUNITY): Payer: Self-pay

## 2016-01-31 ENCOUNTER — Observation Stay (HOSPITAL_COMMUNITY)
Admission: EM | Admit: 2016-01-31 | Discharge: 2016-02-01 | Disposition: A | Discharge status: Home / Self Care | Payer: BLUE CROSS/BLUE SHIELD | Attending: Emergency Medicine | Admitting: Emergency Medicine

## 2016-01-31 DIAGNOSIS — R079 Chest pain, unspecified: Secondary | ICD-10-CM | POA: Diagnosis present

## 2016-01-31 DIAGNOSIS — G8918 Other acute postprocedural pain: Secondary | ICD-10-CM | POA: Diagnosis not present

## 2016-01-31 DIAGNOSIS — H9192 Unspecified hearing loss, left ear: Secondary | ICD-10-CM | POA: Insufficient documentation

## 2016-01-31 DIAGNOSIS — L7632 Postprocedural hematoma of skin and subcutaneous tissue following other procedure: Secondary | ICD-10-CM | POA: Insufficient documentation

## 2016-01-31 DIAGNOSIS — Y838 Other surgical procedures as the cause of abnormal reaction of the patient, or of later complication, without mention of misadventure at the time of the procedure: Secondary | ICD-10-CM | POA: Diagnosis not present

## 2016-01-31 DIAGNOSIS — T148XXA Other injury of unspecified body region, initial encounter: Secondary | ICD-10-CM

## 2016-01-31 DIAGNOSIS — E559 Vitamin D deficiency, unspecified: Secondary | ICD-10-CM | POA: Insufficient documentation

## 2016-01-31 NOTE — ED Provider Notes (Signed)
[facility] DEPT Provider Note   CSN: [id number] Arrival date & time: 01/31/16  2341  By signing my name below, I, Ephriam Jenkins, attest that this documentation has been prepared under the direction and in the presence of Ripley Fraise, MD. Electronically signed, Ephriam Jenkins, ED Scribe. 02/01/16. 12:28 AM.  Patient gave verbal permission to utilize photo for medical documentation only The image was not stored on any personal device  History   Chief Complaint Chief Complaint  Patient presents with  . Post-op Problem    HPI HPI Comments: Mathew [name] is a 55 y.o. male who presents to the Emergency Department complaining of sudden onset pain and swelling to the right side of his upper back; onset one hour ago. Pt had surgery on 01/23/2016 for a resection of a lung mass (benign) on his right side, performed by Dr. Roxan Hockey. Pt states he was walking his dog outside tonight when he bent down to grab the dog, and reports sudden onset of pain and swelling to the area near the surgical scar. Pt states this pain is worse than the day of the surgery, s/p procedure. Pt took 3 ibuprofen PTA with no relief of symptoms. Pt denies fever, shortness of breath, abdominal pain, back pain.    The history is provided by the patient. No language interpreter was used.    Past Medical History:  Diagnosis Date  . Arthritis    osteo  . Asthma    asthma as a child  . Clubbing of nails   . Deafness in left ear   . GERD (gastroesophageal reflux disease)   . Shortness of breath dyspnea    just initially  . Vitamin D deficiency     Patient Active Problem List   Diagnosis Date Noted  . S/P thoracotomy 01/23/2016  . Right lower lobe lung mass 12/11/2015  . Clubbing of nails     Past Surgical History:  Procedure Laterality Date  . KNEE CARTILAGE SURGERY Left 1976  . RESECTION OF MEDIASTINAL MASS Right 01/23/2016   Procedure: RESECTION OF CHEST WALL MASS, Left Muscle Sparing Thoracotomy, and  Unblocked wedge resection right  lower lobe;  Surgeon: [name], MD;  Location: [facility];  Service: Thoracic;  Laterality: Right;  . TONSILLECTOMY    . VIDEO BRONCHOSCOPY WITH ENDOBRONCHIAL ULTRASOUND N/A 12/17/2015   Procedure: VIDEO BRONCHOSCOPY WITH ENDOBRONCHIAL ULTRASOUND;  Surgeon: [name], MD;  Location: [facility];  Service: Thoracic;  Laterality: N/A;      Home Medications    Prior to Admission medications   Medication Sig Start Date End Date Taking? Authorizing Provider  acetaminophen (TYLENOL) 500 MG tablet Take 2 tablets (1,000 mg total) by mouth every 6 (six) hours as needed for mild pain, fever or headache. 01/27/16   Erin R Barrett, PA-C  Ascorbic Acid (VITAMIN C) 1000 MG tablet Take 1,000 mg by mouth daily.    Historical Provider, MD  Cholecalciferol (VITAMIN D3) 5000 units TABS Take 1 tablet by mouth daily.    Historical Provider, MD  hydroxypropyl methylcellulose / hypromellose (ISOPTO TEARS / GONIOVISC) 2.5 % ophthalmic solution Place 1 drop into both eyes See admin instructions. Install 1 drop in both eyes three time a week as needed for dry eyes.  No specific days.    Historical Provider, MD  ibuprofen (ADVIL,MOTRIN) 800 MG tablet Take 1 tablet (800 mg total) by mouth every 6 (six) hours as needed for mild pain. 01/27/16   Erin R Barrett, PA-C  Iodine, Kelp, (  KELP PO) Take 500 mg by mouth daily.    Historical Provider, MD  Probiotic Product (PROBIOTIC DAILY PO) Take 1 capsule by mouth daily.    Historical Provider, MD  traMADol (ULTRAM) 50 MG tablet Take 1 tablet (50 mg total) by mouth every 6 (six) hours as needed for moderate pain. 01/27/16   Erin R Barrett, PA-C  Zinc 100 MG TABS Take 1 tablet by mouth daily.    Historical Provider, MD    Family History Family History  Problem Relation Age of Onset  . Diabetes type II Mother   . Hypertension Father     Social History Social History  Substance Use Topics  . Smoking status: Never Smoker  . Smokeless  tobacco: Former Systems developer    Types: Chew    Quit date: 06/16/1980  . Alcohol use 0.0 oz/week     Comment: 3- 5 drinks q month     Allergies   Aspirin and Penicillins   Review of Systems Review of Systems  Constitutional: Negative for fever.  Respiratory: Negative for shortness of breath.   Cardiovascular: Negative for chest pain.  Gastrointestinal: Negative for abdominal pain.  Musculoskeletal: Positive for arthralgias (pain to area under right armpit). Negative for back pain.  All other systems reviewed and are negative.    Physical Exam Updated Vital Signs BP 155/98 (BP Location: Left Arm)   Pulse (!) 57   Temp 98.1 F (36.7 C) (Oral)   Resp 18   SpO2 100%   Physical Exam CONSTITUTIONAL: Well developed/well nourished HEAD: Normocephalic/atraumatic ENMT: Mucous membranes moist NECK: supple no meningeal signs CV: S1/S2 noted, no murmurs/rubs/gallops noted LUNGS: Lungs are clear to auscultation bilaterally, no apparent distress ABDOMEN: soft, nontender, no rebound or guarding, bowel sounds noted throughout abdomen GU:no cva tenderness NEURO: Pt is awake/alert/appropriate, moves all extremitiesx4.  No facial droop.   EXTREMITIES: pulses normal/equal, full ROM SKIN: see photos PSYCH: no abnormalities of mood noted, alert and oriented to situation        ED Treatments / Results  DIAGNOSTIC STUDIES: Oxygen Saturation is 100% on RA, normal by my interpretation.  COORDINATION OF CARE: 12:27 AM-Will order imaging. Discussed treatment plan with pt at bedside and pt agreed to plan.   Labs (all labs ordered are listed, but only abnormal results are displayed) Labs Reviewed  CBC WITH DIFFERENTIAL/PLATELET  BASIC METABOLIC PANEL    EKG  EKG Interpretation None       Radiology No results found.  Procedures Procedures (including critical care time)  Medications Ordered in ED Medications  fentaNYL (SUBLIMAZE) injection 100 mcg (not administered)      Initial Impression / Assessment and Plan / ED Course  I have reviewed the triage vital signs and the nursing notes.  Pertinent labs & imaging results that were available during my care of the patient were reviewed by me and considered in my medical decision making (see chart for details).  Clinical Course    I spoke to dr Roxy Manns He reviewed xray and photos - he does not feel xray any acute findings He offered admission for pain control/monitoring and repeat CXR Patient prefers to go home as he is feeling improved He has no signs of dehiscence The area is not expanding He has pain meds at home He will call his CT surgeon later today We discussed strict return precautions BP 120/74   Pulse (!) 46   Temp 98.3 F (36.8 C) (Oral)   Resp 20   Ht '5\' 9"'$  (1.753  m)   Wt 80.3 kg   SpO2 97%   BMI 26.14 kg/m    Final Clinical Impressions(s) / ED Diagnoses   Final diagnoses:  Post-operative pain  Hematoma    New Prescriptions New Prescriptions   No medications on file  I personally performed the services described in this documentation, which was scribed in my presence. The recorded information has been reviewed and is accurate.       Ripley Fraise, MD 02/01/16 [phone]

## 2016-01-31 NOTE — ED Triage Notes (Signed)
Pt has a tumor removed from his lung last Wednesday on the R side, was out walking his dog and went to pick his up, swelling to the R side that started about two hours ago, no breath sounds noted.

## 2016-02-01 ENCOUNTER — Encounter: Payer: Self-pay | Admitting: Thoracic Surgery (Cardiothoracic Vascular Surgery)

## 2016-02-01 ENCOUNTER — Other Ambulatory Visit: Payer: Self-pay | Admitting: *Deleted

## 2016-02-01 ENCOUNTER — Emergency Department (HOSPITAL_COMMUNITY): Payer: BLUE CROSS/BLUE SHIELD

## 2016-02-01 ENCOUNTER — Ambulatory Visit
Admission: RE | Admit: 2016-02-01 | Discharge: 2016-02-01 | Disposition: A | Discharge status: Home / Self Care | Payer: BLUE CROSS/BLUE SHIELD | Source: Ambulatory Visit | Attending: Thoracic Surgery (Cardiothoracic Vascular Surgery) | Admitting: Thoracic Surgery (Cardiothoracic Vascular Surgery)

## 2016-02-01 ENCOUNTER — Ambulatory Visit (INDEPENDENT_AMBULATORY_CARE_PROVIDER_SITE_OTHER): Payer: Self-pay | Admitting: Thoracic Surgery (Cardiothoracic Vascular Surgery)

## 2016-02-01 ENCOUNTER — Other Ambulatory Visit: Payer: Self-pay | Admitting: Thoracic Surgery (Cardiothoracic Vascular Surgery)

## 2016-02-01 VITALS — BP 109/61 | HR 66 | Resp 20 | Ht 69.0 in | Wt 177.0 lb

## 2016-02-01 DIAGNOSIS — R079 Chest pain, unspecified: Secondary | ICD-10-CM | POA: Diagnosis present

## 2016-02-01 DIAGNOSIS — R222 Localized swelling, mass and lump, trunk: Secondary | ICD-10-CM

## 2016-02-01 DIAGNOSIS — J984 Other disorders of lung: Secondary | ICD-10-CM

## 2016-02-01 DIAGNOSIS — Z9889 Other specified postprocedural states: Secondary | ICD-10-CM

## 2016-02-01 DIAGNOSIS — R918 Other nonspecific abnormal finding of lung field: Secondary | ICD-10-CM

## 2016-02-01 LAB — CBC WITH DIFFERENTIAL/PLATELET
BASOS PCT: 0 %
Basophils Absolute: 0 10*3/uL (ref 0.0–0.1)
EOS ABS: 0.2 10*3/uL (ref 0.0–0.7)
EOS PCT: 3 %
HCT: 34.9 % — ABNORMAL LOW (ref 39.0–52.0)
HEMOGLOBIN: 11.5 g/dL — AB (ref 13.0–17.0)
Lymphocytes Relative: 27 %
Lymphs Abs: 1.7 10*3/uL (ref 0.7–4.0)
MCH: 29.9 pg (ref 26.0–34.0)
MCHC: 33 g/dL (ref 30.0–36.0)
MCV: 90.9 fL (ref 78.0–100.0)
MONO ABS: 0.5 10*3/uL (ref 0.1–1.0)
MONOS PCT: 8 %
NEUTROS PCT: 62 %
Neutro Abs: 3.9 10*3/uL (ref 1.7–7.7)
PLATELETS: 223 10*3/uL (ref 150–400)
RBC: 3.84 MIL/uL — ABNORMAL LOW (ref 4.22–5.81)
RDW: 14.7 % (ref 11.5–15.5)
WBC: 6.2 10*3/uL (ref 4.0–10.5)

## 2016-02-01 LAB — BASIC METABOLIC PANEL
Anion gap: 5 (ref 5–15)
BUN: 15 mg/dL (ref 6–20)
CO2: 30 mmol/L (ref 22–32)
CREATININE: 1.28 mg/dL — AB (ref 0.61–1.24)
Calcium: 9.2 mg/dL (ref 8.9–10.3)
Chloride: 104 mmol/L (ref 101–111)
Glucose, Bld: 115 mg/dL — ABNORMAL HIGH (ref 65–99)
Potassium: 4.1 mmol/L (ref 3.5–5.1)
SODIUM: 139 mmol/L (ref 135–145)

## 2016-02-01 MED ORDER — OXYCODONE-ACETAMINOPHEN 5-325 MG PO TABS
2.0000 | ORAL_TABLET | Freq: Once | ORAL | Status: AC
  Administered 2016-02-01: 2 via ORAL
  Filled 2016-02-01: qty 2

## 2016-02-01 MED ORDER — FENTANYL CITRATE (PF) 100 MCG/2ML IJ SOLN
100.0000 ug | Freq: Once | INTRAMUSCULAR | Status: AC
  Administered 2016-02-01: 100 ug via INTRAVENOUS
  Filled 2016-02-01: qty 2

## 2016-02-01 NOTE — Progress Notes (Signed)
[address]       [place] [address]             [phone]      [name] returns because of swelling around his incision  Yesterday evening he was walking his dog. The dog jerked the leash he was holding with his right hand. He felt immediate severe pain. "worse than right after the surgery." he then noted swelling in the region. He took 3 ibuprofen tablets for the pain and over time it improved dramatically. The swelling continued to worsen.  He went to the ED. A CXR showed an opacity on the right side. A CT of the chest showed soft tissue hemorrhage on the right side. It was unclear if there was a hematoma or within the muscle fibers. There was a small pleural effusion(no hemothorax) and there was no evidence of rib displacement. His Hct was unchanged from the time of dc.  He was sent home with instructions to folow up with me today.  He took a tramadol this morning on an empty stomach. He then passed out after going to the bathroom. He awoke spontaneously on the floor. His wife heard it and called 911. He felt better by the time they arrived and did not go to the hospital.  PE: 55 yo man in NAD Alert and oriented x 3 Cardiac RRR, normal S1, S2 Lungs slightly diminished right base otherwise clear. Incision intact. Swelling underlying the incision. No drainage, no fluctuance.   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 He developed pain and swelling after his right arm being pulled hard. He likely had some bleeding in the operative field and probably has a hematoma, although it is not clearly a hematoma by exam or noncontrast CT. His fainting spell this morning is classic for a vasovagal episode.  I discussed trying to drain the hematoma(assuming there is one) under local, but can't be sure it would be successful. I think the best option at this point since the pain is almost completely resolved and he  thinks the swelling is better is to just ice it over the weekend and come back on Monday to check again.  There is no evidence of internal bleeding.  He likely had a vagal episode this AM. Cautioned to be slow in changing position and keep hydrated. Eat before taking pain medication.  To call if he has worsening pain, swelling, shortness of breath, dizziness or syncope.   He will return on Monday  Remo Lipps C. Roxan Hockey, MD [facility] [phone]    Addendum  Official CT report  CT CHEST WITHOUT CONTRAST  TECHNIQUE: Multidetector CT imaging of the chest was performed following the standard protocol without IV contrast.  COMPARISON:  CT chest 11/06/2015.  Chest x-ray from earlier today.  FINDINGS: Cardiovascular: The heart size is normal. No pericardial effusion. No thoracic aortic aneurysm.  Mediastinum/Nodes: No mediastinal lymphadenopathy. No evidence for gross hilar lymphadenopathy although assessment is limited by the lack of intravenous contrast on today's study.  Lungs/Pleura: Suture material with scarring identified right lower lobe posteriorly. Small loculated inferior right pleural effusion noted posteriorly. No evidence for pneumothorax.  Upper Abdomen: Unremarkable.  Musculoskeletal: Marked asymmetry of the right posterior lateral thoracic wall musculature is noted with diffuse edema/hemorrhage identified in the subcutaneous tissues and intermuscular fat planes. Without intravenous contrast material it is difficult to fully  assess, but there is an area of focally increased attenuation seen just deep to the latissimus dorsi. This could represent intermuscular hematoma or hematoma within the serratus musculature.  IMPRESSION: 1. Imaging features suggest extra thoracic right posterior lateral chest wall hemorrhage although discrimination of edematous muscle from potential hematoma is difficult without intravenous  contrast material. 2. Small loculated right pleural fluid collection with scarring and volume loss in the right lower lobe. No evidence for pneumothorax.   Electronically Signed   By: Misty Stanley M.D.   On: 02/01/2016 12:56  Revonda Standard. Roxan Hockey, MD [facility] [phone]

## 2016-02-01 NOTE — Discharge Instructions (Signed)
RETURN FOR WORSENED PAIN, WORSENED SHORTNESS OF BREATH OR IF THE WOUND STARTS TO DRAIN OR OPENS UP

## 2016-02-04 ENCOUNTER — Other Ambulatory Visit: Payer: Self-pay | Admitting: *Deleted

## 2016-02-04 ENCOUNTER — Encounter: Payer: Self-pay | Admitting: Thoracic Surgery (Cardiothoracic Vascular Surgery)

## 2016-02-04 ENCOUNTER — Ambulatory Visit (INDEPENDENT_AMBULATORY_CARE_PROVIDER_SITE_OTHER): Payer: Self-pay | Admitting: Thoracic Surgery (Cardiothoracic Vascular Surgery)

## 2016-02-04 VITALS — BP 148/85 | HR 72 | Resp 18 | Ht 69.0 in | Wt 177.0 lb

## 2016-02-04 DIAGNOSIS — J90 Pleural effusion, not elsewhere classified: Secondary | ICD-10-CM

## 2016-02-04 DIAGNOSIS — R918 Other nonspecific abnormal finding of lung field: Secondary | ICD-10-CM

## 2016-02-04 DIAGNOSIS — Z9889 Other specified postprocedural states: Secondary | ICD-10-CM

## 2016-02-04 DIAGNOSIS — J984 Other disorders of lung: Secondary | ICD-10-CM

## 2016-02-04 NOTE — Progress Notes (Signed)
[address]       [place] [address]             [phone]       HPI: Mr. 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 On 8/17 he was walking his dog when the dog jerked on the leash pulling on his right arm and shoulder. He had severe pain and developed swelling. A CT showed bleeding either into or around the right chest wall muscles. I saw him on the 18th. He had a syncopal spell that morning, likely vasovagal in nature.  He says he has not had any more issues since I saw him last week. He has some mild pain but has not taken any narcotics. HE denies shortness of breath, fever, dizziness, syncope.  Past Medical History:  Diagnosis Date  . Arthritis    osteo  . Asthma    asthma as a child  . Clubbing of nails   . Deafness in left ear   . GERD (gastroesophageal reflux disease)   . Shortness of breath dyspnea    just initially  . Vitamin D deficiency       Current Outpatient Prescriptions  Medication Sig Dispense Refill  . acetaminophen (TYLENOL) 500 MG tablet Take 2 tablets (1,000 mg total) by mouth every 6 (six) hours as needed for mild pain, fever or headache. 30 tablet 0  . Ascorbic Acid (VITAMIN C) 1000 MG tablet Take 1,000 mg by mouth daily.    . Cholecalciferol (VITAMIN D3) 5000 units TABS Take 1 tablet by mouth daily.    . hydroxypropyl methylcellulose / hypromellose (ISOPTO TEARS / GONIOVISC) 2.5 % ophthalmic solution Place 1 drop into both eyes See admin instructions. Install 1 drop in both eyes three time a week as needed for dry eyes.  No specific days.    Marland Kitchen ibuprofen (ADVIL,MOTRIN) 800 MG tablet Take 1 tablet (800 mg total) by mouth every 6 (six) hours as needed for mild pain. 60 tablet 1  . Iodine, Kelp, (KELP PO) Take 500 mg by mouth daily.    . Probiotic Product (PROBIOTIC DAILY PO) Take 1  capsule by mouth daily.    . traMADol (ULTRAM) 50 MG tablet Take 1 tablet (50 mg total) by mouth every 6 (six) hours as needed for moderate pain. 30 tablet 0  . Zinc 100 MG TABS Take 1 tablet by mouth daily.     No current facility-administered medications for this visit.     Physical Exam BP (!) 148/85 (BP Location: Right Arm, Patient Position: Sitting, Cuff Size: Normal)   Pulse 72   Resp 18   Ht '5\' 9"'$  (1.753 m)   Wt 177 lb (80.3 kg)   SpO2 98% Comment: RA  BMI 26.14 kg/m  55 yo man in NAD A & O x 3 Incision clean and dry. Swelling posterior to incision.  Impression: 55 yo man now 12 days out from Right thoracotomy and resection of a chest wall mass. He had a bleed into the wound last week and still has swelling in that area. I recommended we try to aspirate that. An attempt was made using sterile technique and local anesthesia with 1% lidocaine but no significant fluid was obtained. It is likely he bled into the muscle itself. It will most likely  either resolve on its own or eventually coalesce into a collection that can be drained.  He is to avoid heavy lifting and strenuous exertion until I see him back on 02/19/16    [name], MD [facility] [phone]

## 2016-02-15 ENCOUNTER — Other Ambulatory Visit: Payer: Self-pay

## 2016-02-15 DIAGNOSIS — R918 Other nonspecific abnormal finding of lung field: Secondary | ICD-10-CM

## 2016-02-19 ENCOUNTER — Ambulatory Visit
Admission: RE | Admit: 2016-02-19 | Discharge: 2016-02-19 | Disposition: A | Discharge status: Home / Self Care | Payer: BLUE CROSS/BLUE SHIELD | Source: Ambulatory Visit | Attending: Thoracic Surgery (Cardiothoracic Vascular Surgery) | Admitting: Thoracic Surgery (Cardiothoracic Vascular Surgery)

## 2016-02-19 ENCOUNTER — Ambulatory Visit (INDEPENDENT_AMBULATORY_CARE_PROVIDER_SITE_OTHER): Payer: Self-pay | Admitting: Thoracic Surgery (Cardiothoracic Vascular Surgery)

## 2016-02-19 ENCOUNTER — Encounter: Payer: Self-pay | Admitting: Thoracic Surgery (Cardiothoracic Vascular Surgery)

## 2016-02-19 VITALS — BP 147/89 | HR 67 | Resp 16 | Ht 69.0 in | Wt 177.0 lb

## 2016-02-19 DIAGNOSIS — R918 Other nonspecific abnormal finding of lung field: Secondary | ICD-10-CM

## 2016-02-19 DIAGNOSIS — Z9889 Other specified postprocedural states: Secondary | ICD-10-CM

## 2016-02-19 DIAGNOSIS — D492 Neoplasm of unspecified behavior of bone, soft tissue, and skin: Secondary | ICD-10-CM

## 2016-02-19 NOTE — Progress Notes (Signed)
[address]       [place] [address]             [phone]       HPI: [name] returns for a scheduled follow-up visit.  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 About a week later he was walking his dog and he developed severe pain and swelling in his chest. He was found to have a soft tissue bleed in the area of his incision on CT. It was unclear if it was bleeding within the muscle or hematoma. I attempted to aspirate the area on 02/04/2016 but was unable to get any fluid back with an 18-gauge needle.  Since then he's been doing well. He says the pain is much improved. Still has some tenderness to touch in the area. The area has gotten softer. He is not having any shortness of breath.  Current Outpatient Prescriptions  Medication Sig Dispense Refill  . acetaminophen (TYLENOL) 500 MG tablet Take 2 tablets (1,000 mg total) by mouth every 6 (six) hours as needed for mild pain, fever or headache. 30 tablet 0  . Ascorbic Acid (VITAMIN C) 1000 MG tablet Take 1,000 mg by mouth daily.    . Cholecalciferol (VITAMIN D3) 5000 units TABS Take 1 tablet by mouth daily.    . hydroxypropyl methylcellulose / hypromellose (ISOPTO TEARS / GONIOVISC) 2.5 % ophthalmic solution Place 1 drop into both eyes See admin instructions. Install 1 drop in both eyes three time a week as needed for dry eyes.  No specific days.    Marland Kitchen ibuprofen (ADVIL,MOTRIN) 800 MG tablet Take 1 tablet (800 mg total) by mouth every 6 (six) hours as needed for mild pain. 60 tablet 1  . Iodine, Kelp, (KELP PO) Take 500 mg by mouth daily.    . Probiotic Product (PROBIOTIC DAILY PO) Take 1 capsule by mouth daily.    . traMADol (ULTRAM) 50 MG tablet Take 1 tablet (50 mg total) by mouth every 6 (six) hours as needed for moderate pain. 30 tablet 0  . Zinc 100 MG TABS Take 1 tablet by mouth daily.     No current  facility-administered medications for this visit.     Physical Exam BP (!) 147/89   Pulse 67   Resp 16   Ht '5\' 9"'$  (1.753 m)   Wt 177 lb (80.3 kg)   SpO2 99% Comment: ON RA  BMI 26.84 kg/m  55 year old man in no acute distress Well-developed and well-nourished Incisions clean dry and intact chest tube sites healing well Soft tissue swelling in the area around the incision posteriorly, no erythema or tenderness  Diagnostic Tests: CHEST  2 VIEW  COMPARISON:  Chest radiograph and chest CT February 01, 2016  FINDINGS: There is a small right pleural effusion with atelectasis at the right base. There is postoperative change with clips in the right apex. No evident pneumothorax. There is soft tissue edema in the inferior lateral right hemithorax compared to the left side. Lungs elsewhere clear. Heart size and pulmonary vascularity are normal. No adenopathy. Previously noted rib fracture on the right is not well seen currently.  IMPRESSION: Postoperative change on the right. Suspect hematoma in the soft tissues along the inferior lateral right hemithorax. No edema or consolidation. No pneumothorax evident.   Electronically Signed   By: Lowella Grip III M.D.   On:  02/19/2016 13:11  I reviewed the chest x-ray concur with plans as noted above.  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 This was complicated by a bleed into the area of the incision and latissimus about a week postoperatively.  There is no sign of infection. His pain is improving. The area does feel a little more ballotable today. I think is reasonable to try another attempt at aspiration. The area was numbed with cold spray followed by 1% lidocaine. Attempts to aspirate with an 18-gauge needle were unsuccessful.  I once again I was unable to aspirate any hematoma. It may be that the blood is out into  the tissues and has not coalesced into a defined hematoma. If so it should be reabsorbed overtime.  Plan: Return in 3 weeks to reevaluate. He does not need a chest x-ray at that time.  [name], MD [facility] [phone]

## 2016-03-11 ENCOUNTER — Encounter: Payer: BLUE CROSS/BLUE SHIELD | Admitting: Thoracic Surgery (Cardiothoracic Vascular Surgery)

## 2016-03-18 ENCOUNTER — Ambulatory Visit (INDEPENDENT_AMBULATORY_CARE_PROVIDER_SITE_OTHER): Payer: Self-pay | Admitting: Thoracic Surgery (Cardiothoracic Vascular Surgery)

## 2016-03-18 ENCOUNTER — Encounter: Payer: Self-pay | Admitting: Thoracic Surgery (Cardiothoracic Vascular Surgery)

## 2016-03-18 VITALS — BP 146/90 | HR 64 | Resp 16 | Ht 69.0 in | Wt 170.0 lb

## 2016-03-18 DIAGNOSIS — Z9889 Other specified postprocedural states: Secondary | ICD-10-CM

## 2016-03-18 DIAGNOSIS — D492 Neoplasm of unspecified behavior of bone, soft tissue, and skin: Secondary | ICD-10-CM

## 2016-03-18 NOTE — Progress Notes (Signed)
[address]       [place] [address]             [phone]       HPI: [name] returns for a scheduled follow-up visit.  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 His early postoperative course was complicated. After discharge he injured himself walking the dog and developed a large hematoma in the incision area. We tried to drain that with a needle on a couple of occasions but were never able to find a defined collection of fluid.  He returns today for follow-up. He is not having much pain. He says is arty noticed an improvement in his respiratory capacity. He does complain of some laxness in the right upper abdominal region.  Past Medical History:  Diagnosis Date  . Arthritis    osteo  . Asthma    asthma as a child  . Clubbing of nails   . Deafness in left ear   . GERD (gastroesophageal reflux disease)   . Shortness of breath dyspnea    just initially  . Vitamin D deficiency      Current Outpatient Prescriptions  Medication Sig Dispense Refill  . acetaminophen (TYLENOL) 500 MG tablet Take 2 tablets (1,000 mg total) by mouth every 6 (six) hours as needed for mild pain, fever or headache. 30 tablet 0  . Ascorbic Acid (VITAMIN C) 1000 MG tablet Take 1,000 mg by mouth daily.    . Cholecalciferol (VITAMIN D3) 5000 units TABS Take 1 tablet by mouth daily.    . hydroxypropyl methylcellulose / hypromellose (ISOPTO TEARS / GONIOVISC) 2.5 % ophthalmic solution Place 1 drop into both eyes See admin instructions. Install 1 drop in both eyes three time a week as needed for dry eyes.  No specific days.    Marland Kitchen ibuprofen (ADVIL,MOTRIN) 800 MG tablet Take 1 tablet (800 mg total) by mouth every 6 (six) hours as needed for mild pain. 60 tablet 1  . Iodine, Kelp, (KELP PO) Take 500 mg by mouth daily.    . Probiotic Product (PROBIOTIC DAILY PO) Take 1 capsule by mouth daily.    .  Zinc 100 MG TABS Take 1 tablet by mouth daily.    . traMADol (ULTRAM) 50 MG tablet Take 1 tablet (50 mg total) by mouth every 6 (six) hours as needed for moderate pain. (Patient not taking: Reported on 03/18/2016) 30 tablet 0   No current facility-administered medications for this visit.     Physical Exam BP (!) 146/90   Pulse 64   Resp 16   Ht '5\' 9"'$  (1.753 m)   Wt 170 lb (77.1 kg)   SpO2 99% Comment: ON RA  BMI 25.10 kg/m  Well-appearing 55 year old man in no acute distress Alert and oriented 3 with no focal deficits Incision is clean dry and intact, swelling is markedly decreased Lungs clear with compresses bilaterally Cardiac regular rate and rhythm normal S1 and S2  Diagnostic Tests: None  Impression: [name] is a 55 year old man who had a 12 cm fibrous tumor resected via a right thoracotomy about 2 months ago. He did develop a hematoma postoperatively but that appears to nearly completely resolved.  He has some incisional pain but is not taking any pain medication for that. He really has relatively little pain comparatively. He does complain of some paresthesias.  His exercise tolerance  is remarkable and should continue to improve as he resumes training.  The laxity of the abdominal musculature on the right side is a common finding after a thoracotomy. Usually over time that will resolve, but there is no guarantee.  Plan: I will see him back in 10 months for one year follow-up. We will do a CT of the chest at that time  Mathew Nakayama, MD [facility] [phone]

## 2016-09-18 ENCOUNTER — Telehealth: Payer: Self-pay | Admitting: Internal Medicine

## 2016-12-30 ENCOUNTER — Other Ambulatory Visit: Payer: Self-pay | Admitting: Thoracic Surgery (Cardiothoracic Vascular Surgery)

## 2016-12-30 DIAGNOSIS — R918 Other nonspecific abnormal finding of lung field: Secondary | ICD-10-CM

## 2017-02-03 ENCOUNTER — Ambulatory Visit
Admission: RE | Admit: 2017-02-03 | Discharge: 2017-02-03 | Disposition: A | Discharge status: Home / Self Care | Payer: BLUE CROSS/BLUE SHIELD | Source: Ambulatory Visit | Attending: Thoracic Surgery (Cardiothoracic Vascular Surgery) | Admitting: Thoracic Surgery (Cardiothoracic Vascular Surgery)

## 2017-02-03 ENCOUNTER — Encounter: Payer: Self-pay | Admitting: Thoracic Surgery (Cardiothoracic Vascular Surgery)

## 2017-02-03 ENCOUNTER — Ambulatory Visit (INDEPENDENT_AMBULATORY_CARE_PROVIDER_SITE_OTHER): Payer: BLUE CROSS/BLUE SHIELD | Admitting: Thoracic Surgery (Cardiothoracic Vascular Surgery)

## 2017-02-03 VITALS — BP 163/88 | HR 62 | Resp 16 | Ht 69.0 in | Wt 183.0 lb

## 2017-02-03 DIAGNOSIS — Z9889 Other specified postprocedural states: Secondary | ICD-10-CM | POA: Diagnosis not present

## 2017-02-03 DIAGNOSIS — R918 Other nonspecific abnormal finding of lung field: Secondary | ICD-10-CM

## 2017-02-03 DIAGNOSIS — R222 Localized swelling, mass and lump, trunk: Secondary | ICD-10-CM

## 2017-02-03 DIAGNOSIS — D492 Neoplasm of unspecified behavior of bone, soft tissue, and skin: Secondary | ICD-10-CM | POA: Diagnosis not present

## 2017-02-03 NOTE — Progress Notes (Signed)
[address]       [place] [address]             [phone]    HPI: [name] returns for one year follow-up visit.  He is a 56 year old man who presented with decreased energy and wheezing with exertion. He was found to have a 12 cm mass in the right chest. I did a right thoracotomy and resected the mass on 01/23/2016. 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 He pushed his activities early in the postoperative period and developed a hematoma. Last saw him in the office in October and he was doing well at that time.  He continues to do well. He sometimes notices some discomfort in the area of his incision when he is doing extreme core exercises. He has been competing in his [employer] competitions. He has not noticed any chest pain, shortness of breath, or wheezing. Past Medical History:  Diagnosis Date  . Arthritis    osteo  . Asthma    asthma as a child  . Clubbing of nails   . Deafness in left ear   . GERD (gastroesophageal reflux disease)   . Shortness of breath dyspnea    just initially  . Vitamin D deficiency      Current Outpatient Prescriptions  Medication Sig Dispense Refill  . acetaminophen (TYLENOL) 500 MG tablet Take 2 tablets (1,000 mg total) by mouth every 6 (six) hours as needed for mild pain, fever or headache. 30 tablet 0  . Ascorbic Acid (VITAMIN C) 1000 MG tablet Take 1,000 mg by mouth daily.    . Cholecalciferol (VITAMIN D3) 5000 units TABS Take 1 tablet by mouth daily.    . hydroxypropyl methylcellulose / hypromellose (ISOPTO TEARS / GONIOVISC) 2.5 % ophthalmic solution Place 1 drop into both eyes See admin instructions. Install 1 drop in both eyes three time a week as needed for dry eyes.  No specific days.    Marland Kitchen ibuprofen (ADVIL,MOTRIN) 800 MG tablet Take 1 tablet (800 mg total) by mouth every 6 (six) hours as needed for mild pain. 60 tablet 1  . Iodine, Kelp, (KELP PO) Take 500 mg by mouth daily.    . Probiotic  Product (PROBIOTIC DAILY PO) Take 1 capsule by mouth daily.    . Zinc 100 MG TABS Take 1 tablet by mouth daily.     No current facility-administered medications for this visit.     Physical Exam BP (!) 163/88 (BP Location: Left Arm, Patient Position: Sitting, Cuff Size: Large)   Pulse 62   Resp 16   Ht 5\' 9"  (1.753 m)   Wt 183 lb (83 kg)   SpO2 98% Comment: RA  BMI 27.02 kg/m  Well-appearing 56 year old man no acute distress Alert and oriented 3 with no focal deficits Incisions well healed Lungs clear with equal breath sounds bilaterally  Diagnostic Tests: CT CHEST WITHOUT CONTRAST  TECHNIQUE: Multidetector CT imaging of the chest was performed following the standard protocol without IV contrast.  COMPARISON:  02/01/2016 chest CT.  FINDINGS: Cardiovascular: Normal heart size. No significant pericardial fluid/thickening. Left anterior descending coronary atherosclerosis. Great vessels are normal in course and caliber.  Mediastinum/Nodes: No discrete thyroid nodules. Unremarkable esophagus. No pathologically enlarged axillary, mediastinal or gross hilar lymph nodes, noting limited sensitivity for the detection of hilar adenopathy on this noncontrast study.  Lungs/Pleura: No pneumothorax. Status post right lower lobe wedge resection. Small parenchymal bands in  the right lower lobe are compatible with mild postsurgical scarring. No pleural effusions. There is a 0.9 cm pleural-based nodular opacity in the peripheral right lower lobe (series 3/ image 86), previously obscured by atelectasis. Right middle lobe 3 mm solid pulmonary nodule (series 4/image 95) and left lower lobe 3 mm solid pulmonary nodule (series 4/ image 69) are stable since 11/06/2015 and considered benign. No acute consolidative airspace disease or additional significant pulmonary nodules.  Upper abdomen: Hypodense 2.8 x 2.7 cm posterior left liver lobe lesion (series 3/image 111) is stable since  11/06/2015 and was non hypermetabolic on 49/67/5916 PET-CT. Simple 1.4 cm medial interpolar right renal cyst.  Musculoskeletal: No aggressive appearing focal osseous lesions. Post thoracotomy change in the lateral right seventh and eighth ribs.  IMPRESSION: 1. Small pleural-based 0.9 cm nodular opacity in the peripheral right lower lobe, previously obscured by atelectasis. Although favored to represent a nodular focus of postsurgical scarring, an early recurrence of the right pleural fibrous tumor is not excluded and attention on follow-up chest CT is advised in 3-6 months. 2. No pleural effusions. 3. Low-attenuation 2.8 cm posterior left liver lobe lesion, stable since 11/06/2015 and non FDG avid on 12/06/2015 PET-CT, most compatible with a benign liver lesion. 4. One vessel coronary atherosclerosis.   Electronically Signed   By: Ilona Sorrel M.D.   On: 02/03/2017 11:14 I personally reviewed the CT chest images.  Impression: [name] is a 56 year old gentleman who is a year out from thoracotomy and resection of a solitary fibrous tumor of the pleura. It was initially unclear whether there was invasion into the lung and a small wedge of lung was excised en bloc. The tumor itself arose from a pedicle high in the apex. He is doing extremely well overall.  His CT today shows some nodular opacity in the peripheral right lower lobe. That is not in the vicinity of where the tumor was arising from the chest wall so I doubt that is a recurrence. Higher up with her tumor arose there is some linear scarring in the lung but nothing masslike.  To be on the safe side, given the extremely large size of the tumor initially, I will plan to rescan him again in 6 months to check out the right lower lobe opacity.  Plan: Return in 6 months with CT chest  [name], MD [facility] [phone]

## 2017-07-02 ENCOUNTER — Other Ambulatory Visit: Payer: Self-pay | Admitting: Thoracic Surgery (Cardiothoracic Vascular Surgery)

## 2017-07-02 DIAGNOSIS — G8912 Acute post-thoracotomy pain: Secondary | ICD-10-CM

## 2017-08-11 ENCOUNTER — Other Ambulatory Visit: Payer: BLUE CROSS/BLUE SHIELD

## 2017-08-11 ENCOUNTER — Ambulatory Visit: Payer: BLUE CROSS/BLUE SHIELD | Admitting: Thoracic Surgery (Cardiothoracic Vascular Surgery)

## 2017-08-26 ENCOUNTER — Ambulatory Visit
Admission: RE | Admit: 2017-08-26 | Discharge: 2017-08-26 | Disposition: A | Discharge status: Home / Self Care | Payer: BLUE CROSS/BLUE SHIELD | Source: Ambulatory Visit | Attending: Thoracic Surgery (Cardiothoracic Vascular Surgery) | Admitting: Thoracic Surgery (Cardiothoracic Vascular Surgery)

## 2017-08-26 DIAGNOSIS — G8912 Acute post-thoracotomy pain: Secondary | ICD-10-CM

## 2017-09-01 ENCOUNTER — Ambulatory Visit: Payer: BLUE CROSS/BLUE SHIELD | Admitting: Thoracic Surgery (Cardiothoracic Vascular Surgery)

## 2017-09-01 ENCOUNTER — Encounter: Payer: Self-pay | Admitting: Thoracic Surgery (Cardiothoracic Vascular Surgery)

## 2017-09-01 VITALS — BP 170/90 | HR 60 | Resp 20 | Ht 69.0 in | Wt 185.0 lb

## 2017-09-01 DIAGNOSIS — Z9889 Other specified postprocedural states: Secondary | ICD-10-CM

## 2017-09-01 DIAGNOSIS — D492 Neoplasm of unspecified behavior of bone, soft tissue, and skin: Secondary | ICD-10-CM

## 2017-09-01 NOTE — Progress Notes (Signed)
[address]       [place] [address]             [phone]    HPI: Mr. 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  The early postoperative period he developed a hematoma.  I last saw him in August 2018 for a one year follow-up.  He was doing well at that time.  There was some nodular opacity in the peripheral right lower lobe noted on his CT at that time.  I recommended a six-month follow-up to be on the safe side.  He has been feeling well.  He continues to participate in [employer] competitions.  He has some numbness in the right chest but no pain.  He is currently eighth in the world in his age group.  Past Medical History:  Diagnosis Date  . Arthritis    osteo  . Asthma    asthma as a child  . Clubbing of nails   . Deafness in left ear   . GERD (gastroesophageal reflux disease)   . Shortness of breath dyspnea    just initially  . Vitamin D deficiency    Past Surgical History:  Procedure Laterality Date  . KNEE CARTILAGE SURGERY Left 1976  . RESECTION OF MEDIASTINAL MASS Right 01/23/2016   Procedure: RESECTION OF CHEST WALL MASS, Left Muscle Sparing Thoracotomy, and Unblocked wedge resection right  lower lobe;  Surgeon: [name], MD;  Location: [facility] [place];  Service: Thoracic;  Laterality: Right;  . TONSILLECTOMY    . VIDEO BRONCHOSCOPY WITH ENDOBRONCHIAL ULTRASOUND N/A 12/17/2015   Procedure: VIDEO BRONCHOSCOPY WITH ENDOBRONCHIAL ULTRASOUND;  Surgeon: [name], MD;  Location: [facility];  Service: Thoracic;  Laterality: N/A;     Current Outpatient Medications  Medication Sig Dispense Refill  . Cholecalciferol (VITAMIN D3) 5000 units TABS Take 1 tablet by mouth daily.    . Iodine, Kelp, (KELP PO) Take 500 mg by mouth daily.    Marland Kitchen acetaminophen (TYLENOL) 500 MG tablet Take 2 tablets (1,000 mg total) by mouth  every 6 (six) hours as needed for mild pain, fever or headache. (Patient not taking: Reported on 09/01/2017) 30 tablet 0  . Ascorbic Acid (VITAMIN C) 1000 MG tablet Take 1,000 mg by mouth daily.    . hydroxypropyl methylcellulose / hypromellose (ISOPTO TEARS / GONIOVISC) 2.5 % ophthalmic solution Place 1 drop into both eyes See admin instructions. Install 1 drop in both eyes three time a week as needed for dry eyes.  No specific days.    Marland Kitchen ibuprofen (ADVIL,MOTRIN) 800 MG tablet Take 1 tablet (800 mg total) by mouth every 6 (six) hours as needed for mild pain. (Patient not taking: Reported on 09/01/2017) 60 tablet 1  . Probiotic Product (PROBIOTIC DAILY PO) Take 1 capsule by mouth daily.    . Zinc 100 MG TABS Take 1 tablet by mouth daily.     No current facility-administered medications for this visit.     Physical Exam BP (!) 170/90   Pulse 60   Resp 20   Ht 5\' 9"  (1.753 m)   Wt 185 lb (83.9 kg)   SpO2 96% Comment: RA  BMI 27.90 kg/m  57 year old man in no acute distress Well-developed well-nourished Lungs clear with equal breath sounds bilaterally Cardiac regular rate and rhythm normal S1-S2  Diagnostic Tests: CT CHEST WITHOUT CONTRAST  TECHNIQUE: Multidetector CT imaging of the chest was performed following the standard protocol without IV contrast.  COMPARISON:  CT chest 02/01/2016 and 02/03/2017.  FINDINGS: Cardiovascular: No significant vascular findings. Normal heart size. No pericardial effusion.  Mediastinum/Nodes: No enlarged mediastinal or axillary lymph nodes. Thyroid gland, trachea, and esophagus demonstrate no significant findings.  Lungs/Pleura: Postoperative change of right lower lobe wedge resection is again seen with unchanged linear scarring present. A subpleural nodular opacity along the periphery of the right chest wall which had measured 0.9 cm on the most recent exam is unchanged on image 111 of series 8 today's study. Possible 0.3 cm right  middle lobe nodule versus a branching vessel seen on the prior study is unchanged on image 115. Branching vessel is favored. Finally, a possible 0.3 cm left lower lobe nodule on image 88 of series 8 is also unchanged. No new or enlarging nodule is identified. The lungs are otherwise clear.  Upper Abdomen: Subtle hypoattenuating lesion in the left hepatic measuring approximately 2.9 by 2.5 cm on image 137 of series 2 is unchanged.  Musculoskeletal: No acute abnormality. Postoperative change lateral aspect of the right seventh and eighth ribs noted.  IMPRESSION: Small subpleural nodular opacity in the right lower lobe seen on the most recent CT scan is unchanged in size and likely is due to scar. No new abnormality since the prior exam.   Electronically Signed   By: Inge Rise M.D.   On: 08/26/2017 14:10 I personally reviewed the CT images and concur with the findings noted above  Impression: Mr. Cowgill is a 57 year old gentleman who is now 18 months out from resection of a solitary fibrous tumor of the pleura this was a large, 12 cm, tumor.  He has no evidence of recurrent disease.  There is some mild scarring and nodularity in the lung and pleura related to the surgery.  He continues to do very well overall.  Plan: Return in 6 months for 2-year follow-up visit.  We will do a CT of the chest at that time.  [name], MD [facility] [phone]

## 2018-01-28 ENCOUNTER — Other Ambulatory Visit: Payer: Self-pay | Admitting: Thoracic Surgery (Cardiothoracic Vascular Surgery)

## 2018-01-28 DIAGNOSIS — R911 Solitary pulmonary nodule: Secondary | ICD-10-CM

## 2018-03-02 ENCOUNTER — Ambulatory Visit: Payer: BLUE CROSS/BLUE SHIELD | Admitting: Thoracic Surgery (Cardiothoracic Vascular Surgery)

## 2018-03-02 ENCOUNTER — Ambulatory Visit
Admission: RE | Admit: 2018-03-02 | Discharge: 2018-03-02 | Disposition: A | Discharge status: Home / Self Care | Payer: BLUE CROSS/BLUE SHIELD | Source: Ambulatory Visit | Attending: Thoracic Surgery (Cardiothoracic Vascular Surgery) | Admitting: Thoracic Surgery (Cardiothoracic Vascular Surgery)

## 2018-03-02 ENCOUNTER — Other Ambulatory Visit: Payer: Self-pay

## 2018-03-02 ENCOUNTER — Encounter: Payer: Self-pay | Admitting: Thoracic Surgery (Cardiothoracic Vascular Surgery)

## 2018-03-02 VITALS — BP 133/81 | HR 56 | Resp 16 | Ht 69.0 in | Wt 177.0 lb

## 2018-03-02 DIAGNOSIS — D492 Neoplasm of unspecified behavior of bone, soft tissue, and skin: Secondary | ICD-10-CM

## 2018-03-02 DIAGNOSIS — R911 Solitary pulmonary nodule: Secondary | ICD-10-CM

## 2018-03-02 DIAGNOSIS — Z9889 Other specified postprocedural states: Secondary | ICD-10-CM | POA: Diagnosis not present

## 2018-03-02 NOTE — Progress Notes (Signed)
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  He developed a hematoma in the early postoperative period, but otherwise has done well.  I last saw him in the office in March.  He was doing well at that time.  A subpleural nodular opacity in the right upper lobe was unchanged from his previous scan.  In the interim since his last visit he is continued to do well.  He continues to compete in [employer].  He is not having any residual effects from his surgery.  Past Medical History:  Diagnosis Date  . Arthritis    osteo  . Asthma    asthma as a child  . Clubbing of nails   . Deafness in left ear   . GERD (gastroesophageal reflux disease)   . Shortness of breath dyspnea    just initially  . Vitamin D deficiency     Current Outpatient Medications  Medication Sig Dispense Refill  . acetaminophen (TYLENOL) 500 MG tablet Take 2 tablets (1,000 mg total) by mouth every 6 (six) hours as needed for mild pain, fever or headache. 30 tablet 0  . Ascorbic Acid (VITAMIN C) 1000 MG tablet Take 1,000 mg by mouth daily.    . Cholecalciferol (VITAMIN D3) 5000 units TABS Take 1 tablet by mouth daily.    . hydroxypropyl methylcellulose / hypromellose (ISOPTO TEARS / GONIOVISC) 2.5 % ophthalmic solution Place 1 drop into both eyes See admin instructions. Install 1 drop in both eyes three time a week as needed for dry eyes.  No specific days.    Marland Kitchen ibuprofen (ADVIL,MOTRIN) 800 MG tablet Take 1 tablet (800 mg total) by mouth every 6 (six) hours as needed for mild pain. 60 tablet 1  . Iodine, Kelp, (KELP PO) Take 500 mg by mouth daily.    . Probiotic Product (PROBIOTIC DAILY PO) Take 1 capsule by mouth daily.    . Zinc 100 MG TABS Take 1 tablet by mouth daily.      No current facility-administered medications for this visit.     Physical Exam BP 133/81 (BP Location: Right Arm, Patient Position: Sitting, Cuff Size: Large)   Pulse (!) 56   Resp 16   Ht 5\' 9"  (1.753 m)   Wt 177 lb (80.3 kg)   SpO2 96% Comment: RA  BMI 26.4 kg/m  57 year old man in no acute distress Alert and oriented x3 with no focal deficits Lungs clear with equal sounds bilaterally Cardiac regular rate and rhythm normal S1 and S2  Diagnostic Tests: CT CHEST WITHOUT CONTRAST  TECHNIQUE: Multidetector CT imaging of the chest was performed following the standard protocol without IV contrast.  COMPARISON:  Multiple prior CT scans.  The most recent is 08/26/2017  FINDINGS: Cardiovascular: The heart is normal in size. No pericardial effusion. The aorta is normal in caliber. No atherosclerotic calcifications. Few small scattered coronary artery calcifications are suspected.  Mediastinum/Nodes: No mediastinal or hilar mass or adenopathy. The esophagus is grossly normal.  Lungs/Pleura: Stable postoperative changes involving the right hemithorax. The area of subpleural nodular thickening is unchanged and consistent with scar tissue. No findings worrisome for neoplasm. No worrisome  pulmonary nodules. No acute pulmonary findings. No pleural effusion.  Upper Abdomen: No significant findings.  Musculoskeletal: No significant bony findings. Expected surgical changes from prior right-sided thoracotomy with healed rib fractures.  IMPRESSION: 1. Stable postoperative changes involving the right hemithorax. Stable subpleural scarring changes in the right lower lobe. No new findings in the right lung. 2. No new pulmonary lesions and no mediastinal or hilar mass or adenopathy.   Electronically Signed   By: Marijo Sanes M.D.   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  He is doing well with no evidence of recurrent disease.  He does not have any residual effects from his surgery.  Plan: Return in 1 year with CT chest  [name], MD [facility] [phone]

## 2018-04-25 IMAGING — CT CT CHEST W/ CM
2 of 3 series · 15 of 30 positions shown, 18 images · IV contrast (75CC ISOVUE 300)
Comparison: 11/24/2006

CLINICAL DATA: Abnormal chest x-ray by report, not available for
review, concern for mass

EXAM:
CT CHEST WITH CONTRAST
TECHNIQUE: Multidetector CT imaging of the chest was performed during
intravenous contrast administration.
CONTRAST:  75mL NECOUP-D44 IOPAMIDOL (NECOUP-D44) INJECTION 61%

[Series 3: chest with · axial · 0.70mm/px · z∈[-283,+20]mm · 12 of 144 slices shown, 15 images]
[im 12/144  mediastinal]
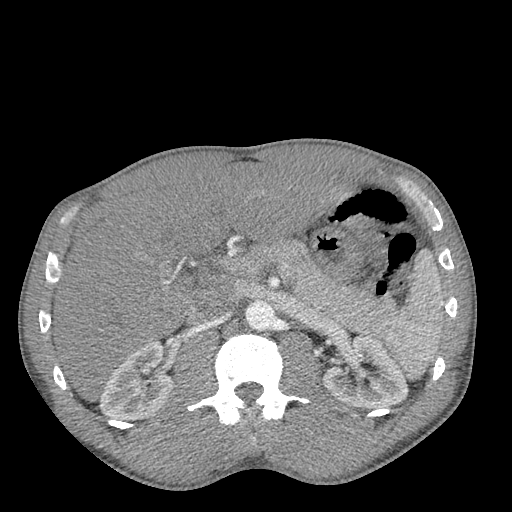
[im 12/144  lung]
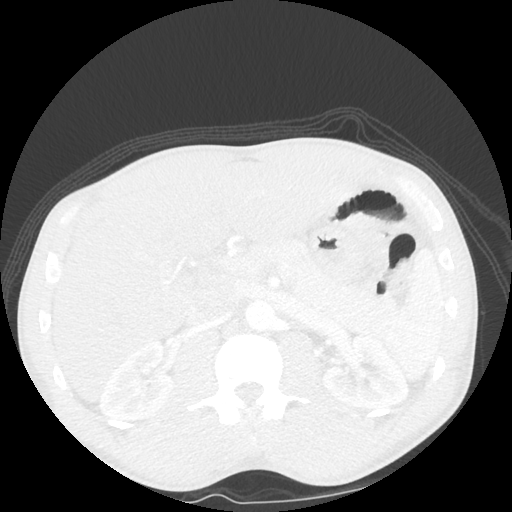
[im 23/144  lung]
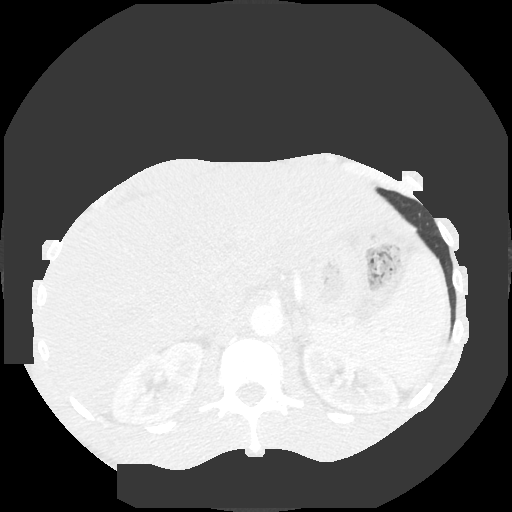
[im 34/144  lung]
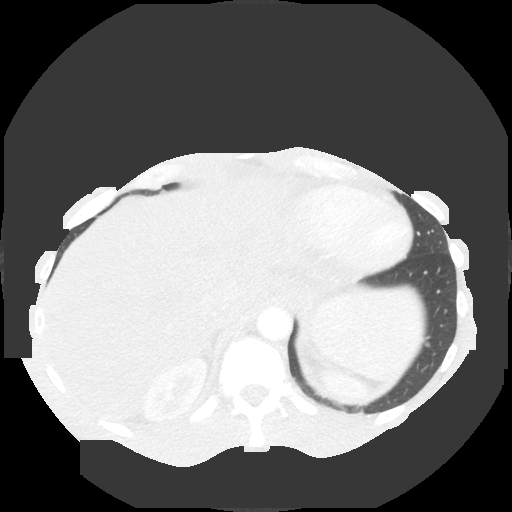
[im 45/144  lung]
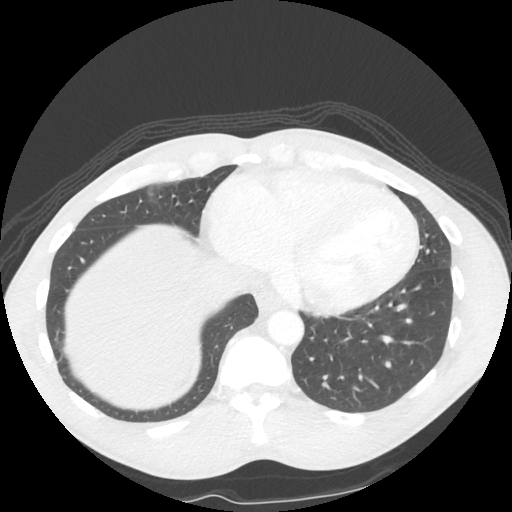
[im 56/144  mediastinal]
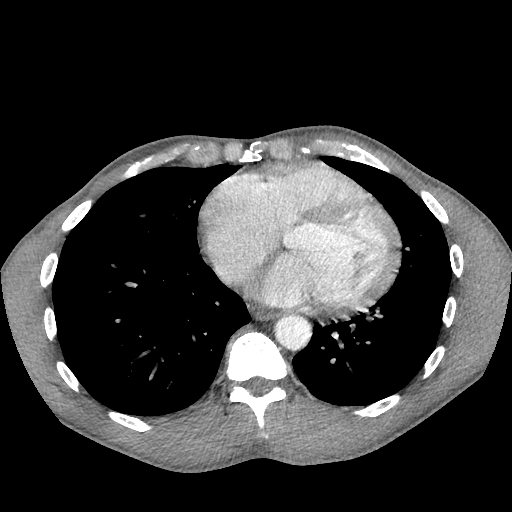
[im 56/144  lung]
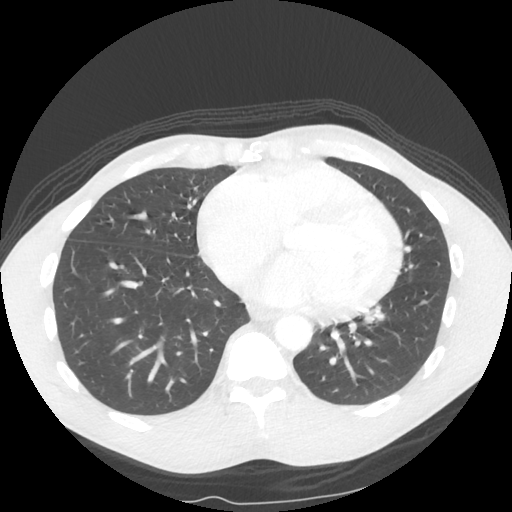
[im 67/144  lung]
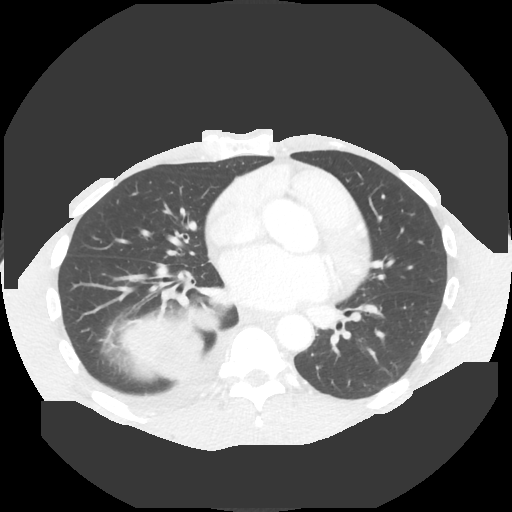
[im 78/144  lung]
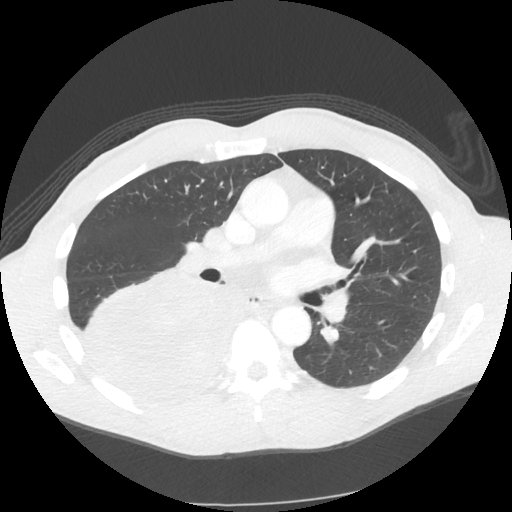
[im 89/144  lung]
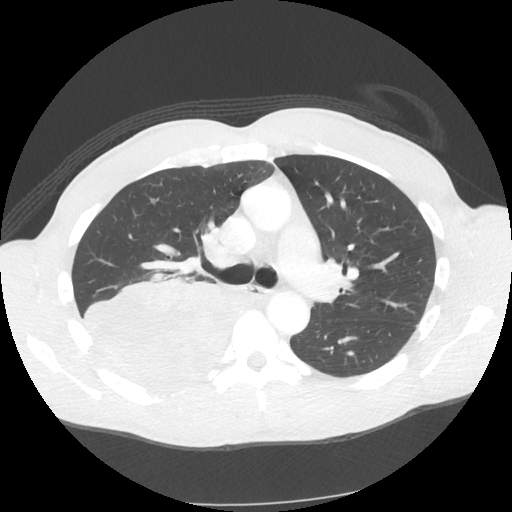
[im 100/144  mediastinal]
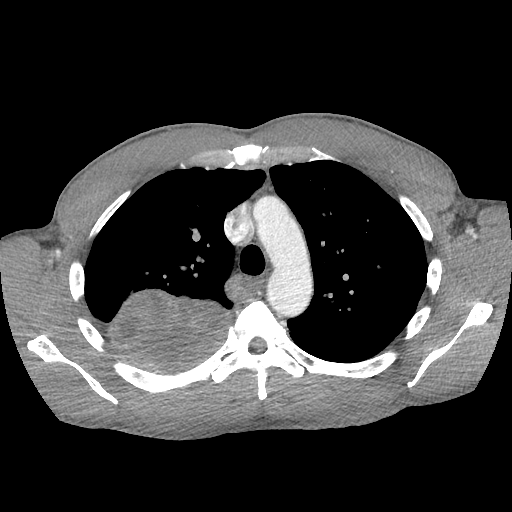
[im 100/144  lung]
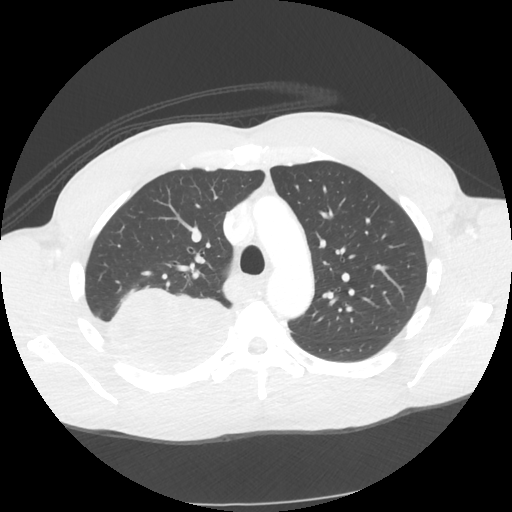
[im 111/144  lung]
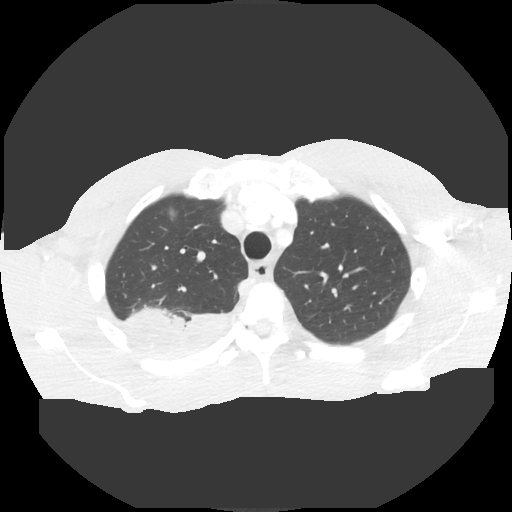
[im 122/144  lung]
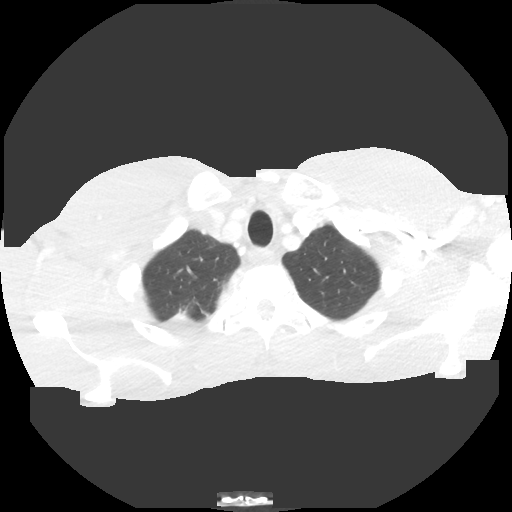
[im 133/144  lung]
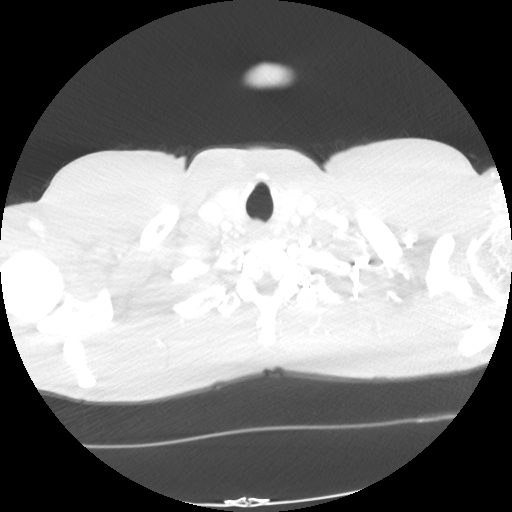

[Series 602: sagittal body · sagittal · 0.70mm/px · 3 of 145 slices shown]
[im 12/145  mediastinal]
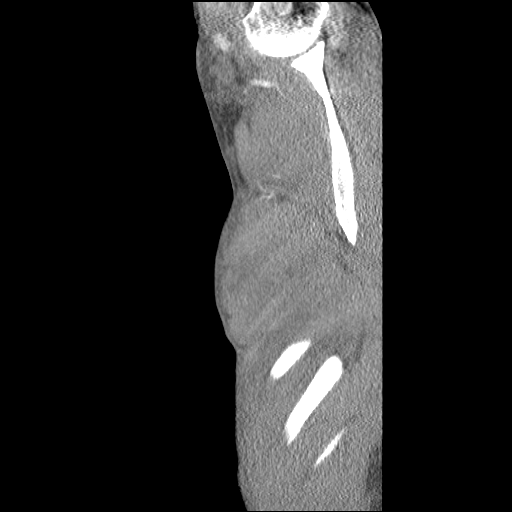
[im 34/145  mediastinal]
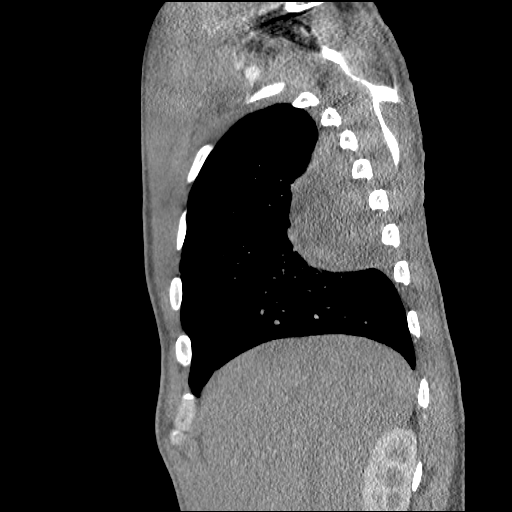
[im 45/145  mediastinal]
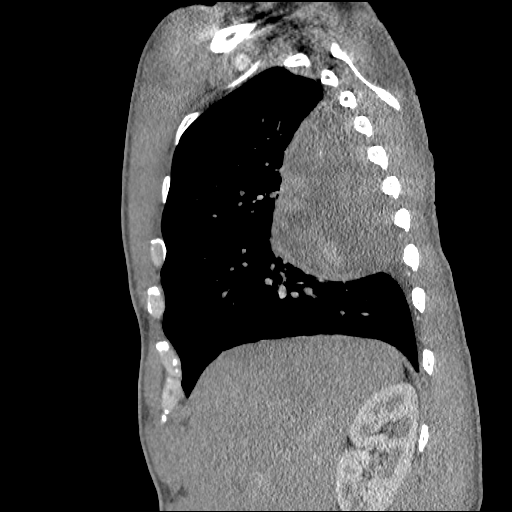

[15 of 30 positions shown; findings below may reference images not displayed]

FINDINGS: On image number 112 in the lateral left lung base there is a 4 mm
pulmonary

There is a large pleural-based mass posteriorly in the mid to
superior aspect of the right lower lobe. The mass shows
heterogeneous attenuation an faint heterogeneous enhancement with a
focus of either more prominent vascular enhancement or calcification
along its inferomedial border. New the mass is inseparable from the
pleura but there is no evidence of underlying rib destruction. There
is a small right pleural effusion inferiorly.

The mass extends from the lateral pleural surface across the right
lower lobe and into the as ago esophageal recess were it is
inseparable from the mediastinum. There is mild anterior mass-effect
upon the right main bronchus and segments of the lower lobe airways
posteriorly. There is also mild mass effect on the esophagus.

There is no significant hilar or mediastinal adenopathy appreciated.
Heart is normal with no pericardial effusion. Thoracic aorta shows
no dissection or dilatation. Nodule. The left lung is otherwise
clear. No significant axillary adenopathy. No acute musculoskeletal
findings. Images through the upper abdomen demonstrate no acute
abnormalities. There is a low-attenuation round lesion measuring 17
mm in the medial midpole of the right kidney, with average
attenuation of 16. A 3 mm low-attenuation upper pole right renal
lesion is noted too small to characterize likely a cyst.
IMPRESSION: 1. Large enhancing soft tissue pleural-based mass posteriorly in the
right lower lobe involving the superior segment with extension into
the mediastinum. This would be amenable to CT guided core needle
biopsy. Associated small pleural effusion on the right.
2. Indeterminate 4 mm pulmonary nodule left lung base.
3. 17 mm right renal lesion likely a cyst. Consider renal ultrasound
to confirm.

## 2019-01-21 ENCOUNTER — Other Ambulatory Visit: Payer: Self-pay | Admitting: *Deleted

## 2019-01-21 DIAGNOSIS — D492 Neoplasm of unspecified behavior of bone, soft tissue, and skin: Secondary | ICD-10-CM

## 2019-01-21 DIAGNOSIS — R918 Other nonspecific abnormal finding of lung field: Secondary | ICD-10-CM

## 2019-01-21 DIAGNOSIS — Z9889 Other specified postprocedural states: Secondary | ICD-10-CM

## 2019-01-21 DIAGNOSIS — R0789 Other chest pain: Secondary | ICD-10-CM

## 2019-03-03 ENCOUNTER — Other Ambulatory Visit: Payer: BLUE CROSS/BLUE SHIELD

## 2019-03-08 ENCOUNTER — Encounter: Payer: BLUE CROSS/BLUE SHIELD | Admitting: Thoracic Surgery (Cardiothoracic Vascular Surgery)

## 2019-03-10 ENCOUNTER — Encounter: Payer: Self-pay | Admitting: Thoracic Surgery (Cardiothoracic Vascular Surgery)

## 2019-03-10 NOTE — Progress Notes (Signed)
      [address]       [place] [address]             [phone]      March 10, 2019  To whom it may concern.   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 As part of his follow up it is necessary to have imaging to evaluate for possible recurrence. Although not malignant, benign solitary fibrous tumors can recur, and he is at high risk for recurrence due to the large size of his tumor. The best imaging option is a CT of the chest. I cannot imagine a scenario where this would not be medically necessary. Please reverse your decision to deny Mr. Vandenberghe's scan  Sincerely,     Revonda Standard. Roxan Hockey, MD  [facility]

## 2019-03-15 ENCOUNTER — Encounter: Payer: BLUE CROSS/BLUE SHIELD | Admitting: Thoracic Surgery (Cardiothoracic Vascular Surgery)

## 2019-03-18 ENCOUNTER — Other Ambulatory Visit: Payer: BLUE CROSS/BLUE SHIELD

## 2019-03-22 ENCOUNTER — Encounter: Payer: BLUE CROSS/BLUE SHIELD | Admitting: Thoracic Surgery (Cardiothoracic Vascular Surgery)

## 2019-04-04 ENCOUNTER — Ambulatory Visit
Admission: RE | Admit: 2019-04-04 | Discharge: 2019-04-04 | Disposition: A | Discharge status: Home / Self Care | Payer: BLUE CROSS/BLUE SHIELD | Source: Ambulatory Visit | Attending: Thoracic Surgery (Cardiothoracic Vascular Surgery) | Admitting: Thoracic Surgery (Cardiothoracic Vascular Surgery)

## 2019-04-04 DIAGNOSIS — Z9889 Other specified postprocedural states: Secondary | ICD-10-CM

## 2019-04-04 DIAGNOSIS — R918 Other nonspecific abnormal finding of lung field: Secondary | ICD-10-CM

## 2019-04-04 DIAGNOSIS — R0789 Other chest pain: Secondary | ICD-10-CM

## 2019-04-04 DIAGNOSIS — D492 Neoplasm of unspecified behavior of bone, soft tissue, and skin: Secondary | ICD-10-CM

## 2019-04-05 ENCOUNTER — Ambulatory Visit: Payer: BLUE CROSS/BLUE SHIELD | Admitting: Thoracic Surgery (Cardiothoracic Vascular Surgery)

## 2019-04-05 ENCOUNTER — Other Ambulatory Visit: Payer: Self-pay

## 2019-04-05 ENCOUNTER — Encounter: Payer: Self-pay | Admitting: Thoracic Surgery (Cardiothoracic Vascular Surgery)

## 2019-04-05 VITALS — BP 166/80 | HR 67 | Temp 98.1°F | Resp 20 | Ht 69.0 in | Wt 185.0 lb

## 2019-04-05 DIAGNOSIS — Z9889 Other specified postprocedural states: Secondary | ICD-10-CM

## 2019-04-05 DIAGNOSIS — R918 Other nonspecific abnormal finding of lung field: Secondary | ICD-10-CM

## 2019-04-05 NOTE — Progress Notes (Signed)
[address]       [name]             [phone]    HPI: [name] returns for a scheduled follow-up visit  Mathew [name] is a 58 year old man with a past medical history significant for asthma, arthritis, and reflux.  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  I did a right thoracotomy to resect that.  He developed a chest wall hematoma in the early postoperative period but did not have any other issues.  He continues to do well.  He is competing in [employer] competitions.  He is running number one of the world in his age group.  He does note occasional cramping sensation in the right subcostal region when doing intense abdominal workouts.  Otherwise he does not have any symptoms referable to his surgery.  He does say that he had bronchitis a couple of weeks ago.  He has some residual coughing but otherwise feels well.  Past Medical History:  Diagnosis Date  . Arthritis    osteo  . Asthma    asthma as a child  . Clubbing of nails   . Deafness in left ear   . GERD (gastroesophageal reflux disease)   . Shortness of breath dyspnea    just initially  . Vitamin D deficiency     Current Outpatient Medications  Medication Sig Dispense Refill  . Ascorbic Acid (VITAMIN C) 1000 MG tablet Take 1,000 mg by mouth daily.    . Cholecalciferol (VITAMIN D3) 5000 units TABS Take 1 tablet by mouth daily.    . Iodine, Kelp, (KELP PO) Take 500 mg by mouth daily.    . Zinc 100 MG TABS Take 1 tablet by mouth daily.     No current facility-administered medications for this visit.     Physical Exam BP (!) 166/80   Pulse 67   Temp 98.1 F (36.7 C) (Skin)   Resp 20   Ht 5\' 9"  (1.753 m)   Wt 185 lb (83.9 kg)   SpO2 98% Comment: RA  BMI 27.32 kg/m   Diagnostic Tests: CT CHEST WITHOUT CONTRAST  TECHNIQUE: Multidetector CT imaging of the chest was performed following the standard protocol without IV contrast.   COMPARISON:  03/02/2018, 08/26/2017, 02/11/2017  FINDINGS: Cardiovascular: No significant vascular findings. Normal heart size. Scattered left coronary artery calcifications no pericardial effusion.  Mediastinum/Nodes: No enlarged mediastinal, hilar, or axillary lymph nodes. Thyroid gland, trachea, and esophagus demonstrate no significant findings.  Lungs/Pleura: Redemonstrated postoperative findings of right upper lobe and right lower lobe wedge resections with unchanged predominantly bandlike scarring of the right lower lobe (series 8, image 110). There is new, clustered subpleural nodular and ground-glass opacity of the medial inferior right upper lobe (series 8, image 88).  Upper Abdomen: No acute abnormality.  Musculoskeletal: No chest wall mass or suspicious bone lesions identified.  IMPRESSION: 1. Redemonstrated postoperative findings of right upper lobe and right lower lobe wedge resections with unchanged predominantly bandlike scarring of the right lower lobe (series 8, image 110).  2. There is new, clustered subpleural nodular and ground-glass opacity of the medial inferior right upper lobe (series 8, image 88), nonspecific and infectious or inflammatory.  3.  Coronary artery disease.   Electronically Signed   By: Eddie Candle M.D.   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  He is now 3 years out from surgery with no evidence of recurrent disease.  He has returned to his full functional status and remains competitive in [employer].  He says he did have bronchitis recently.  I think he probably had a very small pneumonia based on the inflammatory nodules seen on CT scan.  Will reassess that area again next year when we do a repeat CT for follow-up.    Plan: Return in 1 year for CT of the chest.  Indication for CT scanning is increased risk of recurrence due to large size of original tumor (12 cm).  [name], MD [facility] [phone]

## 2020-03-05 ENCOUNTER — Other Ambulatory Visit: Payer: Self-pay | Admitting: *Deleted

## 2020-03-05 DIAGNOSIS — Z8709 Personal history of other diseases of the respiratory system: Secondary | ICD-10-CM

## 2020-03-05 DIAGNOSIS — Z86018 Personal history of other benign neoplasm: Secondary | ICD-10-CM

## 2020-03-22 ENCOUNTER — Other Ambulatory Visit: Payer: Self-pay | Admitting: Thoracic Surgery (Cardiothoracic Vascular Surgery)

## 2020-04-04 ENCOUNTER — Ambulatory Visit
Admission: RE | Admit: 2020-04-04 | Discharge: 2020-04-04 | Disposition: A | Discharge status: Home / Self Care | Payer: BLUE CROSS/BLUE SHIELD | Source: Ambulatory Visit | Attending: Thoracic Surgery (Cardiothoracic Vascular Surgery) | Admitting: Thoracic Surgery (Cardiothoracic Vascular Surgery)

## 2020-04-04 DIAGNOSIS — Z86018 Personal history of other benign neoplasm: Secondary | ICD-10-CM

## 2020-04-04 DIAGNOSIS — Z8709 Personal history of other diseases of the respiratory system: Secondary | ICD-10-CM

## 2020-04-10 ENCOUNTER — Other Ambulatory Visit: Payer: Self-pay

## 2020-04-10 ENCOUNTER — Ambulatory Visit: Payer: BLUE CROSS/BLUE SHIELD | Admitting: Thoracic Surgery (Cardiothoracic Vascular Surgery)

## 2020-04-10 ENCOUNTER — Other Ambulatory Visit: Payer: Self-pay | Admitting: *Deleted

## 2020-04-10 ENCOUNTER — Encounter: Payer: Self-pay | Admitting: *Deleted

## 2020-04-10 ENCOUNTER — Other Ambulatory Visit: Payer: Self-pay | Admitting: Thoracic Surgery (Cardiothoracic Vascular Surgery)

## 2020-04-10 ENCOUNTER — Encounter: Payer: Self-pay | Admitting: Thoracic Surgery (Cardiothoracic Vascular Surgery)

## 2020-04-10 VITALS — BP 161/99 | HR 61 | Resp 18 | Ht 69.0 in | Wt 178.0 lb

## 2020-04-10 DIAGNOSIS — Z86018 Personal history of other benign neoplasm: Secondary | ICD-10-CM

## 2020-04-10 DIAGNOSIS — J90 Pleural effusion, not elsewhere classified: Secondary | ICD-10-CM

## 2020-04-10 DIAGNOSIS — Z8709 Personal history of other diseases of the respiratory system: Secondary | ICD-10-CM

## 2020-04-10 DIAGNOSIS — J948 Other specified pleural conditions: Secondary | ICD-10-CM

## 2020-04-10 DIAGNOSIS — D492 Neoplasm of unspecified behavior of bone, soft tissue, and skin: Secondary | ICD-10-CM | POA: Diagnosis not present

## 2020-04-10 NOTE — Progress Notes (Signed)
[address]       [place] [address]             [phone]     HPI: Mathew [name] returns for scheduled follow-up visit  Mathew [name] is a 59-year man with a past medical history significant for asthma, arthritis, reflux, and a solitary fibrous tumor of the pleura.  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  He was found to have a 12 cm mass in the chest.  That turned out to be a solitary fibrous tumor of the pleura.  It did not have significant mitotic activity or pleomorphism, but due to the large size has been followed since then.  He continues to compete in [employer].  He recently won a competition.  He has been having a little bit more shortness of breath with intense activity.  He is not had any change in appetite or weight loss.  Occasional cough.  Zubrod Score: At the time of surgery this patient's most appropriate activity status/level should be described as: [x]     0    Normal activity, no symptoms []     1    Restricted in physical strenuous activity but ambulatory, able to do out light work []     2    Ambulatory and capable of self care, unable to do work activities, up and about >50 % of waking hours                              []     3    Only limited self care, in bed greater than 50% of waking hours []     4    Completely disabled, no self care, confined to bed or chair []     5    Moribund   Past Medical History:  Diagnosis Date  . Arthritis    osteo  . Asthma    asthma as a child  . Clubbing of nails   . Deafness in left ear   . GERD (gastroesophageal reflux disease)   . Shortness of breath dyspnea    just initially  . Vitamin D deficiency    Past Surgical History:  Procedure Laterality Date  . KNEE CARTILAGE SURGERY Left 1976  . pleural tumor Right 01/22/2016   resection of solitary fibrous tumor  . RESECTION OF MEDIASTINAL  MASS Right 01/23/2016   this is in error- no mediastinal tumor  . TONSILLECTOMY    . VIDEO BRONCHOSCOPY WITH ENDOBRONCHIAL ULTRASOUND N/A 12/17/2015   Procedure: VIDEO BRONCHOSCOPY WITH ENDOBRONCHIAL ULTRASOUND;  Surgeon: [name], MD;  Location: [facility];  Service: Thoracic;  Laterality: N/A;    Current Outpatient Medications  Medication Sig Dispense Refill  . Ascorbic Acid (VITAMIN C) 1000 MG tablet Take 1,000 mg by mouth daily.    . budesonide-formoterol (SYMBICORT) 160-4.5 MCG/ACT inhaler Inhale 2 puffs into the lungs 2 (two) times daily.    . Cholecalciferol (VITAMIN D3) 5000 units TABS Take 1 tablet by mouth daily.    . Iodine, Kelp, (KELP PO) Take 500 mg by mouth daily.    . Zinc 100 MG TABS Take 1 tablet by mouth daily.     No current facility-administered medications for this visit.    Physical Exam BP (!) 161/99 (BP Location: Left Arm, Patient Position: Sitting)  Pulse 61   Resp 18   Ht 5\' 9"  (1.753 m)   Wt 178 lb (80.7 kg)   SpO2 99%   BMI 26.29 kg/m  Well-appearing 59 year old man in no acute distress Alert and oriented x3 with no focal deficits No cervical or supraclavicular adenopathy Cardiac regular rate and rhythm, normal S1 and S2, no rub or murmur Lungs absent breath sounds at right base, otherwise clear Well-healed right thoracotomy incision Abdomen soft nontender No clubbing cyanosis or edema  Diagnostic Tests: CT CHEST WITHOUT CONTRAST  TECHNIQUE: Multidetector CT imaging of the chest was performed following the standard protocol without IV contrast.  COMPARISON:  04/04/2019  FINDINGS: Cardiovascular: No significant aortic atherosclerosis. No aneurysmal dilation of the thoracic aorta. Normal heart size with scattered LEFT coronary calcifications. No pericardial effusion. Central pulmonary vasculature is of normal caliber. Limited assessment of cardiovascular structures given lack of intravenous contrast.  Mediastinum/Nodes: Esophagus  is grossly normal. No gross hilar adenopathy on limited assessment without contrast. No mediastinal adenopathy. No axillary adenopathy.  Lungs/Pleura: Interval development of large RIGHT-sided pleural effusion in the posterior RIGHT chest. This appears partially loculated in this patient post pleural tumor resection. Density is heterogeneous measuring approximately 35 Hounsfield units showing convex margins along its anterior aspect in the upper chest best seen on image 82 of series 2. Also on sagittal images ovoid lesion suggested in this location occupying the upper margin of the "pleural collection", on sagittal images, image is 58 of series 6 this measures approximately 11 x 6 cm and displaces postoperative changes and lung anteriorly.  Discrete area of increased density in the dependent aspect measuring 5.1 x 2.8 cm (image 120 of series 2)  Pleural fluid in the RIGHT posterior costodiaphragmatic recess and beneath the RIGHT lung measures lower density, more compatible with simple fluid.  Tiny pulmonary nodule in the RIGHT middle lobe on image 116 of series 8 measuring approximately 3 mm is not changed. Postoperative changes in the posterior RIGHT chest are again noted, displaced anteriorly due to effusion and associated basilar collapse in the RIGHT chest. Large airways are patent. Minimal atelectasis at the LEFT base.  Upper Abdomen: Incidental imaging of upper abdominal contents is unremarkable.  Musculoskeletal: No acute musculoskeletal process. Signs of RIGHT thoracotomy along the RIGHT lateral chest wall  IMPRESSION: 1. Interval development of large RIGHT-sided pleural effusion with areas of soft tissue density. Strongly suggestive of large area of recurrent tumor in the RIGHT chest as described. Contrasted imaging will be helpful to differentiate recurrent tumor from mixed density material such as blood products that could display similar density on  noncontrast imaging. 2. Associated more simple appearing fluid density in the inferior RIGHT chest below areas described. 3. Basilar volume loss associated with above findings. 4. Stable 3 mm pulmonary nodule in the RIGHT middle lobe. 5. Coronary calcifications. 6. A call is out to the referring provider to further discuss findings in the above case.  Electronically Signed: By: Zetta Bills M.D. On: 04/04/2020 10:32 ADDENDUM: Contrasted imaging as stated previously could be considered for further assessment to better delineate the above findings and assess for signs of enhancing soft tissue.  These results were called by telephone at the time of interpretation on 04/04/2020 at 10:38 am to provider [facility] , who verbally acknowledged these results.   Electronically Signed   By: Zetta Bills M.D.   On: 04/04/2020 10:39 I personally reviewed the chest CT images and concur with the findings noted above.  Impression: Mathew  [name] is a 59 year old gentleman with a past medical history significant for asthma, arthritis, reflux, and a solitary fibrous tumor of the pleura.  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  We have been following him with CT since then.  Saw him in the office a year ago and there was no evidence of recurrent disease.  Recently has been having a little bit more shortness of breath with intense activity.  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  He is an exceptional physical condition.  His CT now shows right pleural effusion with a probable mass in the inferior pleural space.  Differential diagnosis includes hemorrhage, infection, mesothelioma or other tumors.  Almost certainly this will turn out to be a recurrence of his solitary fibrous tumor.  It does not appear to be arising in the same area as it was in previously, although it is difficult to be sure there is not a tumor on a long stalk.  He does have  a pleural effusion which typically is a negative prognostic indicator.  I think the best option is to do a PET/CT.  That will rule out any signs of distant disease, which would change our plan.  Assuming there is no evidence of any type of disease outside the chest, the next step after that would be to reoperate to drain the effusion and remove as much tumor as possible, hopefully a complete resection.  He understands these are relatively rare tumors and there is not a lot of literature and certainly no randomized control trial dealing with recurrence.  The general recommendation is to resect if possible.  After that he would likely need to be evaluated for possible chemotherapy and radiation therapy.  I discussed the plan with Mr. Boakye.  He is in agreement with the plan as outlined above.  I informed him of the planned operative procedure.  That would be an attempt for robotic resection, although he understands there is a strong likelihood that we would need to convert to a thoracotomy.  I informed him of the indications, risks, benefits, and alternatives.  He understands the risks include, but not limited to death, MI, DVT, PE, bleeding, possible need for transfusion, infection, air leak, irregular heart rhythms, as well as possibility of other unforeseeable complications.  He understands we may need to resect part of the lung.  He accepts the risk and agrees to proceed.  Plan: PET/CT Robotic right thoracoscopy for resection of pleural mass and drainage of pleural effusion and possible thoracotomy on Friday, 04/27/2020  [name], MD [facility] [phone]

## 2020-04-10 NOTE — H&P (View-Only) (Signed)
[address]       [place] [address]             [phone]     HPI: Mathew [name] returns for scheduled follow-up visit  Mathew [name] is a 59-year man with a past medical history significant for asthma, arthritis, reflux, and a solitary fibrous tumor of the pleura.  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  He was found to have a 12 cm mass in the chest.  That turned out to be a solitary fibrous tumor of the pleura.  It did not have significant mitotic activity or pleomorphism, but due to the large size has been followed since then.  He continues to compete in [employer].  He recently won a competition.  He has been having a little bit more shortness of breath with intense activity.  He is not had any change in appetite or weight loss.  Occasional cough.  Zubrod Score: At the time of surgery this patient's most appropriate activity status/level should be described as: [x]     0    Normal activity, no symptoms []     1    Restricted in physical strenuous activity but ambulatory, able to do out light work []     2    Ambulatory and capable of self care, unable to do work activities, up and about >50 % of waking hours                              []     3    Only limited self care, in bed greater than 50% of waking hours []     4    Completely disabled, no self care, confined to bed or chair []     5    Moribund   Past Medical History:  Diagnosis Date  . Arthritis    osteo  . Asthma    asthma as a child  . Clubbing of nails   . Deafness in left ear   . GERD (gastroesophageal reflux disease)   . Shortness of breath dyspnea    just initially  . Vitamin D deficiency    Past Surgical History:  Procedure Laterality Date  . KNEE CARTILAGE SURGERY Left 1976  . pleural tumor Right 01/22/2016   resection of solitary fibrous tumor  . RESECTION OF MEDIASTINAL  MASS Right 01/23/2016   this is in error- no mediastinal tumor  . TONSILLECTOMY    . VIDEO BRONCHOSCOPY WITH ENDOBRONCHIAL ULTRASOUND N/A 12/17/2015   Procedure: VIDEO BRONCHOSCOPY WITH ENDOBRONCHIAL ULTRASOUND;  Surgeon: [name], MD;  Location: [facility];  Service: Thoracic;  Laterality: N/A;    Current Outpatient Medications  Medication Sig Dispense Refill  . Ascorbic Acid (VITAMIN C) 1000 MG tablet Take 1,000 mg by mouth daily.    . budesonide-formoterol (SYMBICORT) 160-4.5 MCG/ACT inhaler Inhale 2 puffs into the lungs 2 (two) times daily.    . Cholecalciferol (VITAMIN D3) 5000 units TABS Take 1 tablet by mouth daily.    . Iodine, Kelp, (KELP PO) Take 500 mg by mouth daily.    . Zinc 100 MG TABS Take 1 tablet by mouth daily.     No current facility-administered medications for this visit.    Physical Exam BP (!) 161/99 (BP Location: Left Arm, Patient Position: Sitting)  Pulse 61   Resp 18   Ht 5\' 9"  (1.753 m)   Wt 178 lb (80.7 kg)   SpO2 99%   BMI 26.29 kg/m  Well-appearing 59 year old man man in no acute distress Alert and oriented x3 with no focal deficits No cervical or supraclavicular adenopathy Cardiac regular rate and rhythm, normal S1 and S2, no rub or murmur Lungs absent breath sounds at right base, otherwise clear Well-healed right thoracotomy incision Abdomen soft nontender No clubbing cyanosis or edema  Diagnostic Tests: CT CHEST WITHOUT CONTRAST  TECHNIQUE: Multidetector CT imaging of the chest was performed following the standard protocol without IV contrast.  COMPARISON:  04/04/2019  FINDINGS: Cardiovascular: No significant aortic atherosclerosis. No aneurysmal dilation of the thoracic aorta. Normal heart size with scattered LEFT coronary calcifications. No pericardial effusion. Central pulmonary vasculature is of normal caliber. Limited assessment of cardiovascular structures given lack of intravenous contrast.  Mediastinum/Nodes: Esophagus  is grossly normal. No gross hilar adenopathy on limited assessment without contrast. No mediastinal adenopathy. No axillary adenopathy.  Lungs/Pleura: Interval development of large RIGHT-sided pleural effusion in the posterior RIGHT chest. This appears partially loculated in this patient post pleural tumor resection. Density is heterogeneous measuring approximately 35 Hounsfield units showing convex margins along its anterior aspect in the upper chest best seen on image 82 of series 2. Also on sagittal images ovoid lesion suggested in this location occupying the upper margin of the "pleural collection", on sagittal images, image is 58 of series 6 this measures approximately 11 x 6 cm and displaces postoperative changes and lung anteriorly.  Discrete area of increased density in the dependent aspect measuring 5.1 x 2.8 cm (image 120 of series 2)  Pleural fluid in the RIGHT posterior costodiaphragmatic recess and beneath the RIGHT lung measures lower density, more compatible with simple fluid.  Tiny pulmonary nodule in the RIGHT middle lobe on image 116 of series 8 measuring approximately 3 mm is not changed. Postoperative changes in the posterior RIGHT chest are again noted, displaced anteriorly due to effusion and associated basilar collapse in the RIGHT chest. Large airways are patent. Minimal atelectasis at the LEFT base.  Upper Abdomen: Incidental imaging of upper abdominal contents is unremarkable.  Musculoskeletal: No acute musculoskeletal process. Signs of RIGHT thoracotomy along the RIGHT lateral chest wall  IMPRESSION: 1. Interval development of large RIGHT-sided pleural effusion with areas of soft tissue density. Strongly suggestive of large area of recurrent tumor in the RIGHT chest as described. Contrasted imaging will be helpful to differentiate recurrent tumor from mixed density material such as blood products that could display similar density on  noncontrast imaging. 2. Associated more simple appearing fluid density in the inferior RIGHT chest below areas described. 3. Basilar volume loss associated with above findings. 4. Stable 3 mm pulmonary nodule in the RIGHT middle lobe. 5. Coronary calcifications. 6. A call is out to the referring provider to further discuss findings in the above case.  Electronically Signed: By: Zetta Bills M.D. On: 04/04/2020 10:32 ADDENDUM: Contrasted imaging as stated previously could be considered for further assessment to better delineate the above findings and assess for signs of enhancing soft tissue.  These results were called by telephone at the time of interpretation on 04/04/2020 at 10:38 am to provider [facility] , who verbally acknowledged these results.   Electronically Signed   By: Zetta Bills M.D.   On: 04/04/2020 10:39 I personally reviewed the chest CT images and concur with the findings noted above.  Impression: Mathew  [name] is a 59 year old gentleman with a past medical history significant for asthma, arthritis, reflux, and a solitary fibrous tumor of the pleura.  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  We have been following him with CT since then.  Saw him in the office a year ago and there was no evidence of recurrent disease.  Recently has been having a little bit more shortness of breath with intense activity.  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  He is an exceptional physical condition.  His CT now shows right pleural effusion with a probable mass in the inferior pleural space.  Differential diagnosis includes hemorrhage, infection, mesothelioma or other tumors.  Almost certainly this will turn out to be a recurrence of his solitary fibrous tumor.  It does not appear to be arising in the same area as it was in previously, although it is difficult to be sure there is not a tumor on a long stalk.  He does have  a pleural effusion which typically is a negative prognostic indicator.  I think the best option is to do a PET/CT.  That will rule out any signs of distant disease, which would change our plan.  Assuming there is no evidence of any type of disease outside the chest, the next step after that would be to reoperate to drain the effusion and remove as much tumor as possible, hopefully a complete resection.  He understands these are relatively rare tumors and there is not a lot of literature and certainly no randomized control trial dealing with recurrence.  The general recommendation is to resect if possible.  After that he would likely need to be evaluated for possible chemotherapy and radiation therapy.  I discussed the plan with Mathew [name].  He is in agreement with the plan as outlined above.  I informed him of the planned operative procedure.  That would be an attempt for robotic resection, although he understands there is a strong likelihood that we would need to convert to a thoracotomy.  I informed him of the indications, risks, benefits, and alternatives.  He understands the risks include, but not limited to death, MI, DVT, PE, bleeding, possible need for transfusion, infection, air leak, irregular heart rhythms, as well as possibility of other unforeseeable complications.  He understands we may need to resect part of the lung.  He accepts the risk and agrees to proceed.  Plan: PET/CT Robotic right thoracoscopy for resection of pleural mass and drainage of pleural effusion and possible thoracotomy on Friday, 04/27/2020  [name], MD [facility] [phone]

## 2020-04-16 DIAGNOSIS — C782 Secondary malignant neoplasm of pleura: Secondary | ICD-10-CM

## 2020-04-16 HISTORY — DX: Secondary malignant neoplasm of pleura: C78.2

## 2020-04-23 ENCOUNTER — Other Ambulatory Visit: Payer: Self-pay

## 2020-04-23 ENCOUNTER — Ambulatory Visit (HOSPITAL_COMMUNITY)
Admission: RE | Admit: 2020-04-23 | Discharge: 2020-04-23 | Disposition: A | Discharge status: Home / Self Care | Payer: BLUE CROSS/BLUE SHIELD | Source: Ambulatory Visit | Attending: Thoracic Surgery (Cardiothoracic Vascular Surgery) | Admitting: Thoracic Surgery (Cardiothoracic Vascular Surgery)

## 2020-04-23 DIAGNOSIS — Z8709 Personal history of other diseases of the respiratory system: Secondary | ICD-10-CM | POA: Insufficient documentation

## 2020-04-23 DIAGNOSIS — Z86018 Personal history of other benign neoplasm: Secondary | ICD-10-CM

## 2020-04-23 LAB — GLUCOSE, CAPILLARY: Glucose-Capillary: 84 mg/dL (ref 70–99)

## 2020-04-23 MED ORDER — FLUDEOXYGLUCOSE F - 18 (FDG) INJECTION
8.8600 | Freq: Once | INTRAVENOUS | Status: AC
  Administered 2020-04-23: 8.86 via INTRAVENOUS

## 2020-04-24 NOTE — Pre-Procedure Instructions (Signed)
Mathew [name]  04/24/2020      Your procedure is scheduled on Thursday, November 11.  Report to [facility], Main Entrance or Entrance "A" at 5:30 AM                Your surgery or procedure is scheduled to begin at 7:30 AM   Call this number if you have problems the morning of surgery: [phone]  This is the number for the Pre- Surgical Desk.                For any other questions, please call [phone], Monday - Friday 8 AM - 4 PM.   Remember:  Do not eat or drink after midnight.   Take these medicines the morning of surgery with A SIP OF WATER: None   STOP taking Aspirin, Aspirin Products (Goody Powder, Excedrin Migraine), Ibuprofen (Advil), Naproxen (Aleve), Vitamins and Herbal Products (ie Fish Oil).    Special instructions:    - Preparing For Surgery  Before surgery, you can play an important role. Because skin is not sterile, your skin needs to be as free of germs as possible. You can reduce the number of germs on your skin by washing with CHG (chlorahexidine gluconate) Soap before surgery.  CHG is an antiseptic cleaner which kills germs and bonds with the skin to continue killing germs even after washing.    Oral Hygiene is also important to reduce your risk of infection.  Remember - BRUSH YOUR TEETH THE MORNING OF SURGERY WITH YOUR REGULAR TOOTHPASTE  Please do not use if you have an allergy to CHG or antibacterial soaps. If your skin becomes reddened/irritated stop using the CHG.  Do not shave (including legs and underarms) for at least 48 hours prior to first CHG shower. It is OK to shave your face.  Please follow these instructions carefully.   1. Shower the NIGHT BEFORE SURGERY and the MORNING OF SURGERY with CHG.   2. If you chose to wash your hair, wash your hair first as usual with your normal shampoo.  3. After you shampoo, wash your face and private area with the soap you use at home, then rinse your hair and body thoroughly to  remove the shampoo and soap.  4. Use CHG as you would any other liquid soap. You can apply CHG directly to the skin and wash gently with a scrungie or a clean washcloth.   5. Apply the CHG Soap to your body ONLY FROM THE NECK DOWN.  Do not use on open wounds or open sores. Avoid contact with your eyes, ears, mouth and genitals (private parts).   6. Wash thoroughly, paying special attention to the area where your surgery will be performed.  7. Thoroughly rinse your body with warm water from the neck down.  8. DO NOT shower/wash with your normal soap after using and rinsing off the CHG Soap.  9. Pat yourself dry with a CLEAN TOWEL.  10. Wear CLEAN PAJAMAS to bed the night before surgery, wear comfortable clothes the morning of surgery  11. Place CLEAN SHEETS on your bed the night of your first shower and DO NOT SLEEP WITH PETS.  Day of Surgery: Shower as instructed above. Do not apply any deodorants/lotions, powders or colognes.  Please wear clean clothes to the hospital/surgery center.   Remember to brush your teeth WITH YOUR REGULAR TOOTHPASTE.  Do not wear jewelry, make-up or nail polish.  Do not shave 48  hours prior to surgery.  Men may shave face and neck.  Do not bring valuables to the hospital.  [facility] is not responsible for any belongings or valuables.  Contacts, dentures or bridgework may not be worn into surgery.  Leave your suitcase in the car.  After surgery it may be brought to your room.  For patients admitted to the hospital, discharge time will be determined by your treatment team.  Patients discharged the day of surgery will not be allowed to drive home.   Please read over the fact sheets that you were given.

## 2020-04-25 ENCOUNTER — Encounter (HOSPITAL_COMMUNITY): Payer: Self-pay

## 2020-04-25 ENCOUNTER — Ambulatory Visit (HOSPITAL_COMMUNITY)
Admission: RE | Admit: 2020-04-25 | Discharge: 2020-04-25 | Disposition: A | Discharge status: Home / Self Care | Payer: BLUE CROSS/BLUE SHIELD | Source: Ambulatory Visit | Attending: Thoracic Surgery (Cardiothoracic Vascular Surgery) | Admitting: Thoracic Surgery (Cardiothoracic Vascular Surgery)

## 2020-04-25 ENCOUNTER — Encounter (HOSPITAL_COMMUNITY)
Admission: RE | Admit: 2020-04-25 | Discharge: 2020-04-25 | Disposition: A | Discharge status: Home / Self Care | Payer: BLUE CROSS/BLUE SHIELD | Source: Ambulatory Visit | Attending: Thoracic Surgery (Cardiothoracic Vascular Surgery) | Admitting: Thoracic Surgery (Cardiothoracic Vascular Surgery)

## 2020-04-25 ENCOUNTER — Other Ambulatory Visit: Payer: Self-pay

## 2020-04-25 ENCOUNTER — Other Ambulatory Visit (HOSPITAL_COMMUNITY)
Admission: RE | Admit: 2020-04-25 | Discharge: 2020-04-25 | Disposition: A | Discharge status: Home / Self Care | Payer: BLUE CROSS/BLUE SHIELD | Source: Ambulatory Visit | Attending: Thoracic Surgery (Cardiothoracic Vascular Surgery) | Admitting: Thoracic Surgery (Cardiothoracic Vascular Surgery)

## 2020-04-25 DIAGNOSIS — Z01812 Encounter for preprocedural laboratory examination: Secondary | ICD-10-CM | POA: Insufficient documentation

## 2020-04-25 DIAGNOSIS — J90 Pleural effusion, not elsewhere classified: Secondary | ICD-10-CM | POA: Insufficient documentation

## 2020-04-25 DIAGNOSIS — Z01818 Encounter for other preprocedural examination: Secondary | ICD-10-CM | POA: Insufficient documentation

## 2020-04-25 DIAGNOSIS — J948 Other specified pleural conditions: Secondary | ICD-10-CM | POA: Insufficient documentation

## 2020-04-25 DIAGNOSIS — Z20822 Contact with and (suspected) exposure to covid-19: Secondary | ICD-10-CM | POA: Insufficient documentation

## 2020-04-25 HISTORY — DX: Personal history of urinary calculi: Z87.442

## 2020-04-25 HISTORY — DX: Pneumonia, unspecified organism: J18.9

## 2020-04-25 HISTORY — DX: Elevated blood-pressure reading, without diagnosis of hypertension: R03.0

## 2020-04-25 LAB — CBC
HCT: 41.9 % (ref 39.0–52.0)
Hemoglobin: 13.4 g/dL (ref 13.0–17.0)
MCH: 30.5 pg (ref 26.0–34.0)
MCHC: 32 g/dL (ref 30.0–36.0)
MCV: 95.2 fL (ref 80.0–100.0)
Platelets: 226 10*3/uL (ref 150–400)
RBC: 4.4 MIL/uL (ref 4.22–5.81)
RDW: 12 % (ref 11.5–15.5)
WBC: 7.3 10*3/uL (ref 4.0–10.5)
nRBC: 0 % (ref 0.0–0.2)

## 2020-04-25 LAB — URINALYSIS, ROUTINE W REFLEX MICROSCOPIC
Bilirubin Urine: NEGATIVE
Glucose, UA: NEGATIVE mg/dL
Hgb urine dipstick: NEGATIVE
Ketones, ur: NEGATIVE mg/dL
Leukocytes,Ua: NEGATIVE
Nitrite: NEGATIVE
Protein, ur: NEGATIVE mg/dL
Specific Gravity, Urine: 1.02 (ref 1.005–1.030)
pH: 7 (ref 5.0–8.0)

## 2020-04-25 LAB — BLOOD GAS, ARTERIAL
Acid-Base Excess: 2.6 mmol/L — ABNORMAL HIGH (ref 0.0–2.0)
Bicarbonate: 26.3 mmol/L (ref 20.0–28.0)
Drawn by: 58793
FIO2: 21
O2 Saturation: 95.3 %
Patient temperature: 37
pCO2 arterial: 38.2 mmHg (ref 32.0–48.0)
pH, Arterial: 7.452 — ABNORMAL HIGH (ref 7.350–7.450)
pO2, Arterial: 76 mmHg — ABNORMAL LOW (ref 83.0–108.0)

## 2020-04-25 LAB — COMPREHENSIVE METABOLIC PANEL
ALT: 17 U/L (ref 0–44)
AST: 20 U/L (ref 15–41)
Albumin: 3.1 g/dL — ABNORMAL LOW (ref 3.5–5.0)
Alkaline Phosphatase: 96 U/L (ref 38–126)
Anion gap: 9 (ref 5–15)
BUN: 12 mg/dL (ref 8–23)
CO2: 24 mmol/L (ref 22–32)
Calcium: 9 mg/dL (ref 8.9–10.3)
Chloride: 104 mmol/L (ref 98–111)
Creatinine, Ser: 0.98 mg/dL (ref 0.61–1.24)
GFR, Estimated: >60 mL/min (ref >60)
Glucose, Bld: 91 mg/dL (ref 70–99)
Potassium: 3.9 mmol/L (ref 3.5–5.1)
Sodium: 137 mmol/L (ref 135–145)
Total Bilirubin: 1.4 mg/dL — ABNORMAL HIGH (ref 0.3–1.2)
Total Protein: 6.7 g/dL (ref 6.5–8.1)

## 2020-04-25 LAB — SURGICAL PCR SCREEN
MRSA, PCR: NEGATIVE
Staphylococcus aureus: NEGATIVE

## 2020-04-25 LAB — TYPE AND SCREEN
ABO/RH(D): O POS
Antibody Screen: NEGATIVE

## 2020-04-25 LAB — PROTIME-INR
INR: 1.2 (ref 0.8–1.2)
Prothrombin Time: 14.3 seconds (ref 11.4–15.2)

## 2020-04-25 LAB — APTT: aPTT: 46 seconds — ABNORMAL HIGH (ref 24–36)

## 2020-04-25 LAB — SARS CORONAVIRUS 2 (TAT 6-24 HRS): SARS Coronavirus 2: NEGATIVE

## 2020-04-25 NOTE — Progress Notes (Addendum)
PCP - Dr. [name] Cardiologist - Denies  PPM/ICD - Denies  Chest x-ray - 04/25/20 EKG - 04/25/20 Stress Test - Denies ECHO - Denies Cardiac Cath - Denies  Sleep Study - Denies  Patient denies having diabetes.  Blood Thinner Instructions: N/A Aspirin Instructions: N/A  ERAS Protcol - No  COVID TEST- 04/25/20   Coronavirus Screening  Have you experienced the following symptoms:  Cough yes/no: No Fever (>100.26F)  yes/no: No Runny nose yes/no: No Sore throat yes/no: No Difficulty breathing/shortness of breath  yes/no: No  Have you or a family member traveled in the last 14 days and where? yes/no: No   If the patient indicates "YES" to the above questions, their PAT will be rescheduled to limit the exposure to others and, the surgeon will be notified. THE PATIENT WILL NEED TO BE ASYMPTOMATIC FOR 14 DAYS.   If the patient is not experiencing any of these symptoms, the PAT nurse will instruct them to NOT bring anyone with them to their appointment since they may have these symptoms or traveled as well.   Please remind your patients and families that hospital visitation restrictions are in effect and the importance of the restrictions.     Anesthesia review: Yes, abnormal EKG; Patient's BP during PAT appt was 178/100. Rechecked BP twice before patient left, diastolic BP still in the 878'M. Notified Karoline Caldwell, [place]. Instructed patient to check BP frequently for the next few days before surgery. James to follow up tomorrow with patient regarding BP.  Patient denies shortness of breath, fever, cough and chest pain at PAT appointment   All instructions explained to the patient, with a verbal understanding of the material. Patient agrees to go over the instructions while at home for a better understanding. Patient also instructed to self quarantine after being tested for COVID-19. The opportunity to ask questions was provided.

## 2020-04-25 NOTE — Pre-Procedure Instructions (Addendum)
Your procedure is scheduled on Friday, April 27, 2020.  Report to [facility] Main Entrance "A" at 5:30 A.M., and check in at the Admitting office.  Call this number if you have problems the morning of surgery:  [phone]  Call [phone] if you have any questions prior to your surgery date Monday-Friday 8am-4pm    Remember:  Do not eat or drink after midnight the night before your surgery    Take these medicines the morning of surgery with A SIP OF WATER:   NONE  As of today, STOP taking any Aspirin (unless otherwise instructed by your surgeon) Aleve, Naproxen, Ibuprofen, Motrin, Advil, Goody's, BC's, all herbal medications, fish oil, and all vitamins.                      Do not wear jewelry.            Do not wear lotions, powders, colognes, or deodorant.            Do not shave 48 hours prior to surgery.  Men may shave face and neck.            Do not bring valuables to the hospital.            [facility] is not responsible for any belongings or valuables.  Do NOT Smoke (Tobacco/Vaping) or drink Alcohol 24 hours prior to your procedure If you use a CPAP at night, you may bring all equipment for your overnight stay.   Contacts, glasses, dentures or bridgework may not be worn into surgery.      For patients admitted to the hospital, discharge time will be determined by your treatment team.   Patients discharged the day of surgery will not be allowed to drive home, and someone needs to stay with them for 24 hours.    Special instructions:   [facility]- Preparing For Surgery  Before surgery, you can play an important role. Because skin is not sterile, your skin needs to be as free of germs as possible. You can reduce the number of germs on your skin by washing with CHG (chlorahexidine gluconate) Soap before surgery.  CHG is an antiseptic cleaner which kills germs and bonds with the skin to continue killing germs even after washing.    Oral Hygiene is also important  to reduce your risk of infection.  Remember - BRUSH YOUR TEETH THE MORNING OF SURGERY WITH YOUR REGULAR TOOTHPASTE  Please do not use if you have an allergy to CHG or antibacterial soaps. If your skin becomes reddened/irritated stop using the CHG.  Do not shave (including legs and underarms) for at least 48 hours prior to first CHG shower. It is OK to shave your face.  Please follow these instructions carefully.   1. Shower the NIGHT BEFORE SURGERY and the MORNING OF SURGERY with CHG Soap.   2. If you chose to wash your hair, wash your hair first as usual with your normal shampoo.  3. After you shampoo, rinse your hair and body thoroughly to remove the shampoo.  4. Use CHG as you would any other liquid soap. You can apply CHG directly to the skin and wash gently with a scrungie or a clean washcloth.   5. Apply the CHG Soap to your body ONLY FROM THE NECK DOWN.  Do not use on open wounds or open sores. Avoid contact with your eyes, ears, mouth and genitals (private parts). Wash Face and genitals (private parts)  with your normal soap.   6. Wash thoroughly, paying special attention to the area where your surgery will be performed.  7. Thoroughly rinse your body with warm water from the neck down.  8. DO NOT shower/wash with your normal soap after using and rinsing off the CHG Soap.  9. Pat yourself dry with a CLEAN TOWEL.  10. Wear CLEAN PAJAMAS to bed the night before surgery  11. Place CLEAN SHEETS on your bed the night of your first shower and DO NOT SLEEP WITH PETS.   Day of Surgery: Wear Clean/Comfortable clothing the morning of surgery Do not apply any deodorants/lotions.   Remember to brush your teeth WITH YOUR REGULAR TOOTHPASTE.   Please read over the following fact sheets that you were given.

## 2020-04-26 NOTE — Anesthesia Preprocedure Evaluation (Addendum)
Anesthesia Evaluation  Patient identified by MRN, date of birth, ID band Patient awake    Reviewed: Allergy & Precautions, NPO status , Patient's Chart, lab work & pertinent test results  History of Anesthesia Complications Negative for: history of anesthetic complications  Airway Mallampati: I  TM Distance: >3 FB Neck ROM: Full    Dental  (+) Dental Advisory Given, Teeth Intact   Pulmonary asthma ,   Pleural mass and effusion RLL mass    Pulmonary exam normal        Cardiovascular hypertension (white coat), Normal cardiovascular exam     Neuro/Psych  Left ear deafness  negative psych ROS   GI/Hepatic GERD  Controlled,(+)     substance abuse  marijuana use,   Endo/Other  negative endocrine ROS  Renal/GU negative Renal ROS     Musculoskeletal  (+) Arthritis , Osteoarthritis,    Abdominal   Peds  Hematology negative hematology ROS (+)   Anesthesia Other Findings Covid test negative   Reproductive/Obstetrics                            Anesthesia Physical Anesthesia Plan  ASA: III  Anesthesia Plan: General   Post-op Pain Management:    Induction: Intravenous  PONV Risk Score and Plan: 2 and Treatment may vary due to age or medical condition, Ondansetron, Dexamethasone, Midazolam and Scopolamine patch - Pre-op  Airway Management Planned: Double Lumen EBT  Additional Equipment: Arterial line, CVP and Ultrasound Guidance Line Placement  Intra-op Plan:   Post-operative Plan: Possible Post-op intubation/ventilation  Informed Consent: I have reviewed the patients History and Physical, chart, labs and discussed the procedure including the risks, benefits and alternatives for the proposed anesthesia with the patient or authorized representative who has indicated his/her understanding and acceptance.     Dental advisory given  Plan Discussed with: CRNA and  Anesthesiologist  Anesthesia Plan Comments:        Anesthesia Quick Evaluation

## 2020-04-26 NOTE — Progress Notes (Signed)
Anesthesia Chart Review:  Hx of solitary fibrous tumor of the pleura.  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 He has been followed by Dr. Roxan Hockey with serial CT since then. His CT now shows right pleural effusion with a probable mass in the inferior pleural space.  Pt has exceptionally high functional baseline. 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 He has noted only slight shortness of breath with intense activity.  History of abnormal EKG with inferior T wave inversions. These have been noted since at least 2017 in [place]. Appears stable on tracing 04/25/20.  Pt noted to have elevated BP at PAT appt, 170/100. He denies hx of HTN. 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 He has BP cuff at home and was instructed to check several times today and tomorrow and I would follow up with him. I spoke to the pt via phone on 11/11 and he said his home pressures run ~140/80. He says he has a long history of white coat htn that has been discussed with both his PCP and Dr. Roxan Hockey. He states both have advised him to continue to monitor at home, and that he does not require antihypertensive medications at this point due to history of normotensive or only slightly elevated home readings. He has previously taken his machine into his PCPs office to confirm calibration. Pt also stated he has had abnormal EKG for >20 years and reports he underwent workup ~2000 that was benign. His PCP repeats tracing yearly and no changes have been documented.  Preop labs reviewed. PTT mildly elevated at 46. Per Epic, Dr. Roxan Hockey has reviewed.   Wynonia Musty [facility] Short Stay Center/Anesthesiology Phone [phone] 04/26/2020 12:33 PM

## 2020-04-27 ENCOUNTER — Inpatient Hospital Stay (HOSPITAL_COMMUNITY): Payer: BLUE CROSS/BLUE SHIELD | Admitting: Physician Assistant

## 2020-04-27 ENCOUNTER — Other Ambulatory Visit: Payer: Self-pay

## 2020-04-27 ENCOUNTER — Inpatient Hospital Stay (HOSPITAL_COMMUNITY): Payer: BLUE CROSS/BLUE SHIELD

## 2020-04-27 ENCOUNTER — Inpatient Hospital Stay (HOSPITAL_COMMUNITY)
Admission: RE | Admit: 2020-04-27 | Discharge: 2020-04-30 | DRG: 164 | Disposition: A | Discharge status: Home / Self Care | Payer: BLUE CROSS/BLUE SHIELD | Attending: Thoracic Surgery (Cardiothoracic Vascular Surgery) | Admitting: Thoracic Surgery (Cardiothoracic Vascular Surgery)

## 2020-04-27 ENCOUNTER — Encounter (HOSPITAL_COMMUNITY)
Admission: RE | Disposition: A | Discharge status: Home / Self Care | Payer: Self-pay | Source: Home / Self Care | Attending: Thoracic Surgery (Cardiothoracic Vascular Surgery)

## 2020-04-27 ENCOUNTER — Encounter (HOSPITAL_COMMUNITY): Payer: Self-pay | Admitting: Thoracic Surgery (Cardiothoracic Vascular Surgery)

## 2020-04-27 ENCOUNTER — Inpatient Hospital Stay (HOSPITAL_COMMUNITY): Payer: BLUE CROSS/BLUE SHIELD | Admitting: Certified Registered"

## 2020-04-27 DIAGNOSIS — J939 Pneumothorax, unspecified: Secondary | ICD-10-CM | POA: Diagnosis not present

## 2020-04-27 DIAGNOSIS — J948 Other specified pleural conditions: Principal | ICD-10-CM

## 2020-04-27 DIAGNOSIS — D62 Acute posthemorrhagic anemia: Secondary | ICD-10-CM | POA: Diagnosis not present

## 2020-04-27 DIAGNOSIS — H9192 Unspecified hearing loss, left ear: Secondary | ICD-10-CM | POA: Diagnosis not present

## 2020-04-27 DIAGNOSIS — Z4682 Encounter for fitting and adjustment of non-vascular catheter: Secondary | ICD-10-CM

## 2020-04-27 DIAGNOSIS — D491 Neoplasm of unspecified behavior of respiratory system: Secondary | ICD-10-CM | POA: Diagnosis present

## 2020-04-27 DIAGNOSIS — K219 Gastro-esophageal reflux disease without esophagitis: Secondary | ICD-10-CM | POA: Diagnosis present

## 2020-04-27 DIAGNOSIS — Z886 Allergy status to analgesic agent status: Secondary | ICD-10-CM

## 2020-04-27 DIAGNOSIS — M1990 Unspecified osteoarthritis, unspecified site: Secondary | ICD-10-CM | POA: Diagnosis not present

## 2020-04-27 DIAGNOSIS — Z880 Allergy status to penicillin: Secondary | ICD-10-CM | POA: Diagnosis not present

## 2020-04-27 DIAGNOSIS — Z79899 Other long term (current) drug therapy: Secondary | ICD-10-CM

## 2020-04-27 DIAGNOSIS — E559 Vitamin D deficiency, unspecified: Secondary | ICD-10-CM | POA: Diagnosis present

## 2020-04-27 DIAGNOSIS — J90 Pleural effusion, not elsewhere classified: Secondary | ICD-10-CM

## 2020-04-27 DIAGNOSIS — J45909 Unspecified asthma, uncomplicated: Secondary | ICD-10-CM | POA: Diagnosis present

## 2020-04-27 DIAGNOSIS — Z20822 Contact with and (suspected) exposure to covid-19: Secondary | ICD-10-CM | POA: Diagnosis not present

## 2020-04-27 DIAGNOSIS — Z7951 Long term (current) use of inhaled steroids: Secondary | ICD-10-CM | POA: Diagnosis not present

## 2020-04-27 DIAGNOSIS — Z9889 Other specified postprocedural states: Secondary | ICD-10-CM

## 2020-04-27 HISTORY — PX: INTERCOSTAL NERVE BLOCK: SHX5021

## 2020-04-27 HISTORY — PX: THORACOTOMY: SHX5074

## 2020-04-27 LAB — POCT I-STAT 7, (LYTES, BLD GAS, ICA,H+H)
Acid-Base Excess: 2 mmol/L (ref 0.0–2.0)
Acid-Base Excess: 2 mmol/L (ref 0.0–2.0)
Acid-Base Excess: 4 mmol/L — ABNORMAL HIGH (ref 0.0–2.0)
Acid-base deficit: 1 mmol/L (ref 0.0–2.0)
Bicarbonate: 23.7 mmol/L (ref 20.0–28.0)
Bicarbonate: 27.1 mmol/L (ref 20.0–28.0)
Bicarbonate: 27.2 mmol/L (ref 20.0–28.0)
Bicarbonate: 28.2 mmol/L — ABNORMAL HIGH (ref 20.0–28.0)
Calcium, Ion: 1.11 mmol/L — ABNORMAL LOW (ref 1.15–1.40)
Calcium, Ion: 1.18 mmol/L (ref 1.15–1.40)
Calcium, Ion: 1.19 mmol/L (ref 1.15–1.40)
Calcium, Ion: 1.15 mmol/L (ref 1.15–1.40)
HCT: 33 % — ABNORMAL LOW (ref 39.0–52.0)
HCT: 33 % — ABNORMAL LOW (ref 39.0–52.0)
HCT: 32 % — ABNORMAL LOW (ref 39.0–52.0)
HCT: 30 % — ABNORMAL LOW (ref 39.0–52.0)
Hemoglobin: 11.2 g/dL — ABNORMAL LOW (ref 13.0–17.0)
Hemoglobin: 11.2 g/dL — ABNORMAL LOW (ref 13.0–17.0)
Hemoglobin: 10.9 g/dL — ABNORMAL LOW (ref 13.0–17.0)
Hemoglobin: 10.2 g/dL — ABNORMAL LOW (ref 13.0–17.0)
O2 Saturation: 100 %
O2 Saturation: 99 %
O2 Saturation: 99 %
O2 Saturation: 100 %
Potassium: 3.2 mmol/L — ABNORMAL LOW (ref 3.5–5.1)
Potassium: 4.3 mmol/L (ref 3.5–5.1)
Potassium: 4.2 mmol/L (ref 3.5–5.1)
Potassium: 4.3 mmol/L (ref 3.5–5.1)
Sodium: 142 mmol/L (ref 135–145)
Sodium: 136 mmol/L (ref 135–145)
Sodium: 137 mmol/L (ref 135–145)
Sodium: 136 mmol/L (ref 135–145)
TCO2: 25 mmol/L (ref 22–32)
TCO2: 28 mmol/L (ref 22–32)
TCO2: 28 mmol/L (ref 22–32)
TCO2: 29 mmol/L (ref 22–32)
pCO2 arterial: 39.2 mmHg (ref 32.0–48.0)
pCO2 arterial: 42 mmHg (ref 32.0–48.0)
pCO2 arterial: 42.9 mmHg (ref 32.0–48.0)
pCO2 arterial: 41.1 mmHg (ref 32.0–48.0)
pH, Arterial: 7.39 (ref 7.350–7.450)
pH, Arterial: 7.418 (ref 7.350–7.450)
pH, Arterial: 7.41 (ref 7.350–7.450)
pH, Arterial: 7.445 (ref 7.350–7.450)
pO2, Arterial: 245 mmHg — ABNORMAL HIGH (ref 83.0–108.0)
pO2, Arterial: 115 mmHg — ABNORMAL HIGH (ref 83.0–108.0)
pO2, Arterial: 141 mmHg — ABNORMAL HIGH (ref 83.0–108.0)
pO2, Arterial: 228 mmHg — ABNORMAL HIGH (ref 83.0–108.0)

## 2020-04-27 SURGERY — EXCISION, MASS, MEDIASTINUM, ROBOT-ASSISTED
Anesthesia: General | Site: Chest | Laterality: Right

## 2020-04-27 MED ORDER — PHENYLEPHRINE HCL-NACL 10-0.9 MG/250ML-% IV SOLN
INTRAVENOUS | Status: DC | PRN
  Administered 2020-04-27: 50 ug/min via INTRAVENOUS
  Administered 2020-04-27: 70 ug/min via INTRAVENOUS

## 2020-04-27 MED ORDER — LACTATED RINGERS IV SOLN: INTRAVENOUS | Status: DC | PRN

## 2020-04-27 MED ORDER — DIPHENHYDRAMINE HCL 12.5 MG/5ML PO ELIX: 12.5000 mg | ORAL_SOLUTION | Freq: Four times a day (QID) | ORAL | Status: DC | PRN

## 2020-04-27 MED ORDER — DEXAMETHASONE SODIUM PHOSPHATE 10 MG/ML IJ SOLN
INTRAMUSCULAR | Status: AC
  Filled 2020-04-27: qty 1

## 2020-04-27 MED ORDER — GLYCOPYRROLATE PF 0.2 MG/ML IJ SOSY
PREFILLED_SYRINGE | INTRAMUSCULAR | Status: DC | PRN
  Administered 2020-04-27: .2 mg via INTRAVENOUS

## 2020-04-27 MED ORDER — PROPOFOL 10 MG/ML IV BOLUS
INTRAVENOUS | Status: DC | PRN
  Administered 2020-04-27: 200 mg via INTRAVENOUS

## 2020-04-27 MED ORDER — LACTATED RINGERS IV SOLN: INTRAVENOUS | Status: DC

## 2020-04-27 MED ORDER — HYDROMORPHONE 1 MG/ML IV SOLN
INTRAVENOUS | Status: AC
  Administered 2020-04-27: 30 mg via INTRAVENOUS
  Filled 2020-04-27: qty 30

## 2020-04-27 MED ORDER — BUPIVACAINE HCL (PF) 0.5 % IJ SOLN
INTRAMUSCULAR | Status: AC
  Filled 2020-04-27: qty 30

## 2020-04-27 MED ORDER — ONDANSETRON HCL 4 MG/2ML IJ SOLN: 4.0000 mg | Freq: Four times a day (QID) | INTRAMUSCULAR | Status: DC | PRN

## 2020-04-27 MED ORDER — SENNOSIDES-DOCUSATE SODIUM 8.6-50 MG PO TABS
1.0000 | ORAL_TABLET | Freq: Every day | ORAL | Status: DC
  Administered 2020-04-28 – 2020-04-29 (×2): 1 via ORAL
  Filled 2020-04-27 (×2): qty 1

## 2020-04-27 MED ORDER — GLYCOPYRROLATE PF 0.2 MG/ML IJ SOSY
PREFILLED_SYRINGE | INTRAMUSCULAR | Status: AC
  Filled 2020-04-27: qty 1

## 2020-04-27 MED ORDER — HYDROMORPHONE 1 MG/ML IV SOLN
INTRAVENOUS | Status: DC
  Administered 2020-04-27: 0.3 mL via INTRAVENOUS
  Administered 2020-04-28 (×2): 0.6 mg via INTRAVENOUS
  Administered 2020-04-28: 1.8 mg via INTRAVENOUS
  Administered 2020-04-28: 0.6 mg via INTRAVENOUS
  Administered 2020-04-28: 1.2 mg via INTRAVENOUS
  Administered 2020-04-28: 0.9 mg via INTRAVENOUS
  Administered 2020-04-29: 0.3 mg via INTRAVENOUS
  Administered 2020-04-29: 0.9 mg via INTRAVENOUS

## 2020-04-27 MED ORDER — DEXAMETHASONE SODIUM PHOSPHATE 10 MG/ML IJ SOLN
INTRAMUSCULAR | Status: DC | PRN
  Administered 2020-04-27: 10 mg via INTRAVENOUS

## 2020-04-27 MED ORDER — ACETAMINOPHEN 160 MG/5ML PO SOLN: 1000.0000 mg | Freq: Four times a day (QID) | ORAL | Status: DC

## 2020-04-27 MED ORDER — SUGAMMADEX SODIUM 200 MG/2ML IV SOLN
INTRAVENOUS | Status: DC | PRN
  Administered 2020-04-27: 180 mg via INTRAVENOUS

## 2020-04-27 MED ORDER — FENTANYL CITRATE (PF) 100 MCG/2ML IJ SOLN
INTRAMUSCULAR | Status: DC | PRN
  Administered 2020-04-27 (×2): 25 ug via INTRAVENOUS
  Administered 2020-04-27 (×3): 50 ug via INTRAVENOUS
  Administered 2020-04-27: 100 ug via INTRAVENOUS
  Administered 2020-04-27 (×4): 50 ug via INTRAVENOUS

## 2020-04-27 MED ORDER — ORAL CARE MOUTH RINSE: 15.0000 mL | Freq: Once | OROMUCOSAL | Status: AC

## 2020-04-27 MED ORDER — SODIUM CHLORIDE 0.9 % IR SOLN
Status: DC | PRN
  Administered 2020-04-27: 1000 mL

## 2020-04-27 MED ORDER — MIDAZOLAM HCL 5 MG/5ML IJ SOLN
INTRAMUSCULAR | Status: DC | PRN
  Administered 2020-04-27: 2 mg via INTRAVENOUS

## 2020-04-27 MED ORDER — VANCOMYCIN HCL IN DEXTROSE 1-5 GM/200ML-% IV SOLN
1000.0000 mg | Freq: Two times a day (BID) | INTRAVENOUS | Status: AC
  Administered 2020-04-27: 1000 mg via INTRAVENOUS
  Filled 2020-04-27: qty 200

## 2020-04-27 MED ORDER — PROPOFOL 10 MG/ML IV BOLUS
INTRAVENOUS | Status: AC
  Filled 2020-04-27: qty 40

## 2020-04-27 MED ORDER — MORPHINE SULFATE (PF) 2 MG/ML IV SOLN: 2.0000 mg | INTRAVENOUS | Status: DC | PRN

## 2020-04-27 MED ORDER — SODIUM CHLORIDE 0.9% FLUSH: 9.0000 mL | INTRAVENOUS | Status: DC | PRN

## 2020-04-27 MED ORDER — LIDOCAINE 2% (20 MG/ML) 5 ML SYRINGE
INTRAMUSCULAR | Status: DC | PRN
  Administered 2020-04-27: 80 mg via INTRAVENOUS

## 2020-04-27 MED ORDER — KETOROLAC TROMETHAMINE 30 MG/ML IJ SOLN
INTRAMUSCULAR | Status: AC
  Filled 2020-04-27: qty 1

## 2020-04-27 MED ORDER — SODIUM CHLORIDE 0.9 % IV SOLN: INTRAVENOUS | Status: DC

## 2020-04-27 MED ORDER — CHLORHEXIDINE GLUCONATE 0.12 % MT SOLN
15.0000 mL | Freq: Once | OROMUCOSAL | Status: AC
  Administered 2020-04-27: 15 mL via OROMUCOSAL
  Filled 2020-04-27: qty 15

## 2020-04-27 MED ORDER — BUPIVACAINE LIPOSOME 1.3 % IJ SUSP
INTRAMUSCULAR | Status: DC | PRN
  Administered 2020-04-27: 65 mL

## 2020-04-27 MED ORDER — FENTANYL CITRATE (PF) 250 MCG/5ML IJ SOLN
INTRAMUSCULAR | Status: AC
  Filled 2020-04-27: qty 5

## 2020-04-27 MED ORDER — MIDAZOLAM HCL 2 MG/2ML IJ SOLN
INTRAMUSCULAR | Status: AC
  Filled 2020-04-27: qty 2

## 2020-04-27 MED ORDER — FENTANYL CITRATE (PF) 100 MCG/2ML IJ SOLN: 25.0000 ug | INTRAMUSCULAR | Status: DC | PRN

## 2020-04-27 MED ORDER — ARTIFICIAL TEARS OPHTHALMIC OINT
TOPICAL_OINTMENT | OPHTHALMIC | Status: AC
  Filled 2020-04-27: qty 3.5

## 2020-04-27 MED ORDER — OXYCODONE HCL 5 MG PO TABS: 5.0000 mg | ORAL_TABLET | Freq: Once | ORAL | Status: DC | PRN

## 2020-04-27 MED ORDER — LIDOCAINE 2% (20 MG/ML) 5 ML SYRINGE
INTRAMUSCULAR | Status: AC
  Filled 2020-04-27: qty 5

## 2020-04-27 MED ORDER — OXYCODONE HCL 5 MG/5ML PO SOLN: 5.0000 mg | Freq: Once | ORAL | Status: DC | PRN

## 2020-04-27 MED ORDER — 0.9 % SODIUM CHLORIDE (POUR BTL) OPTIME
TOPICAL | Status: DC | PRN
  Administered 2020-04-27: 1000 mL
  Administered 2020-04-27: 2000 mL

## 2020-04-27 MED ORDER — ROCURONIUM BROMIDE 10 MG/ML (PF) SYRINGE
PREFILLED_SYRINGE | INTRAVENOUS | Status: AC
  Filled 2020-04-27: qty 20

## 2020-04-27 MED ORDER — BUPIVACAINE LIPOSOME 1.3 % IJ SUSP
20.0000 mL | INTRAMUSCULAR | Status: DC
  Filled 2020-04-27: qty 20

## 2020-04-27 MED ORDER — OXYCODONE HCL 5 MG PO TABS
5.0000 mg | ORAL_TABLET | ORAL | Status: DC | PRN
  Administered 2020-04-29 (×2): 10 mg via ORAL
  Filled 2020-04-27 (×3): qty 2

## 2020-04-27 MED ORDER — HEMOSTATIC AGENTS (NO CHARGE) OPTIME: TOPICAL | Status: DC | PRN

## 2020-04-27 MED ORDER — VANCOMYCIN HCL IN DEXTROSE 1-5 GM/200ML-% IV SOLN
1000.0000 mg | INTRAVENOUS | Status: AC
  Administered 2020-04-27: 1000 mg via INTRAVENOUS
  Filled 2020-04-27: qty 200

## 2020-04-27 MED ORDER — ACETAMINOPHEN 10 MG/ML IV SOLN
INTRAVENOUS | Status: DC | PRN
  Administered 2020-04-27: 1000 mg via INTRAVENOUS

## 2020-04-27 MED ORDER — TRAMADOL HCL 50 MG PO TABS
50.0000 mg | ORAL_TABLET | Freq: Four times a day (QID) | ORAL | Status: DC | PRN
  Administered 2020-04-29: 100 mg via ORAL
  Filled 2020-04-27: qty 2

## 2020-04-27 MED ORDER — BISACODYL 5 MG PO TBEC
10.0000 mg | DELAYED_RELEASE_TABLET | Freq: Every day | ORAL | Status: DC
  Administered 2020-04-28 – 2020-04-29 (×2): 10 mg via ORAL
  Filled 2020-04-27 (×2): qty 2

## 2020-04-27 MED ORDER — PROMETHAZINE HCL 25 MG/ML IJ SOLN: 6.2500 mg | INTRAMUSCULAR | Status: DC | PRN

## 2020-04-27 MED ORDER — CHLORHEXIDINE GLUCONATE CLOTH 2 % EX PADS
6.0000 | MEDICATED_PAD | Freq: Every day | CUTANEOUS | Status: DC
  Administered 2020-04-28: 6 via TOPICAL

## 2020-04-27 MED ORDER — ROCURONIUM BROMIDE 100 MG/10ML IV SOLN
INTRAVENOUS | Status: DC | PRN
  Administered 2020-04-27: 20 mg via INTRAVENOUS
  Administered 2020-04-27: 60 mg via INTRAVENOUS
  Administered 2020-04-27: 40 mg via INTRAVENOUS
  Administered 2020-04-27 (×3): 30 mg via INTRAVENOUS
  Administered 2020-04-27: 40 mg via INTRAVENOUS
  Administered 2020-04-27: 30 mg via INTRAVENOUS

## 2020-04-27 MED ORDER — ALBUMIN HUMAN 5 % IV SOLN: INTRAVENOUS | Status: DC | PRN

## 2020-04-27 MED ORDER — DIPHENHYDRAMINE HCL 50 MG/ML IJ SOLN: 12.5000 mg | Freq: Four times a day (QID) | INTRAMUSCULAR | Status: DC | PRN

## 2020-04-27 MED ORDER — NALOXONE HCL 0.4 MG/ML IJ SOLN: 0.4000 mg | INTRAMUSCULAR | Status: DC | PRN

## 2020-04-27 MED ORDER — ONDANSETRON HCL 4 MG/2ML IJ SOLN
INTRAMUSCULAR | Status: DC | PRN
  Administered 2020-04-27: 4 mg via INTRAVENOUS

## 2020-04-27 MED ORDER — ONDANSETRON HCL 4 MG/2ML IJ SOLN
INTRAMUSCULAR | Status: AC
  Filled 2020-04-27: qty 2

## 2020-04-27 MED ORDER — ACETAMINOPHEN 500 MG PO TABS
1000.0000 mg | ORAL_TABLET | Freq: Four times a day (QID) | ORAL | Status: DC
  Administered 2020-04-28 – 2020-04-30 (×8): 1000 mg via ORAL
  Filled 2020-04-27 (×8): qty 2

## 2020-04-27 SURGICAL SUPPLY — 157 items
ADH SKN CLS APL DERMABOND .7 (GAUZE/BANDAGES/DRESSINGS) ×1
APPLIER CLIP ROT 10 11.4 M/L (STAPLE)
APR CLP MED LRG 11.4X10 (STAPLE)
BAG SPEC RTRVL C1550 15 (MISCELLANEOUS) ×1
BAG TISS RTRVL C300 12X14 (MISCELLANEOUS) ×3
BIT DRILL 7/64X5 DISP (BIT) IMPLANT
BLADE CLIPPER SURG (BLADE) ×1 IMPLANT
BLADE STERNUM SYSTEM 6 (BLADE) IMPLANT
BNDG COHESIVE 6X5 TAN STRL LF (GAUZE/BANDAGES/DRESSINGS) ×2 IMPLANT
CANISTER SUCT 3000ML PPV (MISCELLANEOUS) ×5 IMPLANT
CANNULA REDUC XI 12-8 STAPL (CANNULA) ×4
CANNULA REDUCER 12-8 DVNC XI (CANNULA) ×2 IMPLANT
CATH THORACIC 28FR (CATHETERS) ×1 IMPLANT
CATH THORACIC 28FR RT ANG (CATHETERS) IMPLANT
CATH THORACIC 36FR (CATHETERS) IMPLANT
CATH THORACIC 36FR RT ANG (CATHETERS) IMPLANT
CLIP APPLIE ROT 10 11.4 M/L (STAPLE) IMPLANT
CLIP VESOCCLUDE MED 6/CT (CLIP) ×2 IMPLANT
CNTNR URN SCR LID CUP LEK RST (MISCELLANEOUS) ×5 IMPLANT
CONN ST 1/4X3/8  BEN (MISCELLANEOUS) ×2
CONN ST 1/4X3/8 BEN (MISCELLANEOUS) IMPLANT
CONN Y 3/8X3/8X3/8  BEN (MISCELLANEOUS) ×2
CONN Y 3/8X3/8X3/8 BEN (MISCELLANEOUS) IMPLANT
CONT SPEC 4OZ STRL OR WHT (MISCELLANEOUS) ×18
COVER MAYO STAND STRL (DRAPES) ×1 IMPLANT
DECANTER SPIKE VIAL GLASS SM (MISCELLANEOUS) ×1 IMPLANT
DEFOGGER SCOPE WARMER CLEARIFY (MISCELLANEOUS) ×3 IMPLANT
DERMABOND ADVANCED (GAUZE/BANDAGES/DRESSINGS) ×1
DERMABOND ADVANCED .7 DNX12 (GAUZE/BANDAGES/DRESSINGS) ×1 IMPLANT
DRAIN CHANNEL 28F RND 3/8 FF (WOUND CARE) ×2 IMPLANT
DRAIN CHANNEL 32F RND 10.7 FF (WOUND CARE) IMPLANT
DRAPE ARM DVNC X/XI (DISPOSABLE) ×4 IMPLANT
DRAPE COLUMN DVNC XI (DISPOSABLE) ×1 IMPLANT
DRAPE CV SPLIT W-CLR ANES SCRN (DRAPES) ×2 IMPLANT
DRAPE DA VINCI XI ARM (DISPOSABLE) ×8
DRAPE DA VINCI XI COLUMN (DISPOSABLE) ×2
DRAPE INCISE IOBAN 66X45 STRL (DRAPES) IMPLANT
DRAPE ORTHO SPLIT 77X108 STRL (DRAPES) ×2
DRAPE SURG ORHT 6 SPLT 77X108 (DRAPES) ×1 IMPLANT
DRAPE WARM FLUID 44X44 (DRAPES) ×2 IMPLANT
DRSG AQUACEL AG ADV 3.5X10 (GAUZE/BANDAGES/DRESSINGS) ×1 IMPLANT
DRSG AQUACEL AG ADV 3.5X14 (GAUZE/BANDAGES/DRESSINGS) IMPLANT
ELECT BLADE 6.5 EXT (BLADE) ×3 IMPLANT
ELECT REM PT RETURN 9FT ADLT (ELECTROSURGICAL) ×2
ELECTRODE REM PT RTRN 9FT ADLT (ELECTROSURGICAL) ×1 IMPLANT
FELT TEFLON 1X6 (MISCELLANEOUS) ×2 IMPLANT
GAUZE KITTNER 4X5 RF (MISCELLANEOUS) ×7 IMPLANT
GAUZE KITTNER 4X8 (MISCELLANEOUS) ×3 IMPLANT
GAUZE SPONGE 4X4 12PLY STRL (GAUZE/BANDAGES/DRESSINGS) ×2 IMPLANT
GLOVE BIO SURGEON STRL SZ 6.5 (GLOVE) ×1 IMPLANT
GLOVE ECLIPSE 8.0 STRL XLNG CF (GLOVE) ×5 IMPLANT
GLOVE SURG SIGNA 7.5 PF LTX (GLOVE) ×7 IMPLANT
GLOVE SURG SS PI 7.5 STRL IVOR (GLOVE) ×1 IMPLANT
GLOVE SURG SS PI 8.0 STRL IVOR (GLOVE) ×1 IMPLANT
GOWN STRL REUS W/ TWL LRG LVL3 (GOWN DISPOSABLE) ×2 IMPLANT
GOWN STRL REUS W/ TWL XL LVL3 (GOWN DISPOSABLE) ×3 IMPLANT
GOWN STRL REUS W/TWL 2XL LVL3 (GOWN DISPOSABLE) ×5 IMPLANT
GOWN STRL REUS W/TWL LRG LVL3 (GOWN DISPOSABLE) ×4
GOWN STRL REUS W/TWL XL LVL3 (GOWN DISPOSABLE) ×6
HEMOSTAT POWDER SURGIFOAM 1G (HEMOSTASIS) IMPLANT
HEMOSTAT SURGICEL 2X14 (HEMOSTASIS) ×5 IMPLANT
INSERT FOGARTY 61MM (MISCELLANEOUS) IMPLANT
IRRIGATION STRYKERFLOW (MISCELLANEOUS) ×2 IMPLANT
IRRIGATOR STRYKERFLOW (MISCELLANEOUS) ×2
KIT BASIN OR (CUSTOM PROCEDURE TRAY) ×4 IMPLANT
KIT SUCTION CATH 14FR (SUCTIONS) ×2 IMPLANT
KIT TURNOVER KIT B (KITS) ×2 IMPLANT
LOOP VESSEL SUPERMAXI WHITE (MISCELLANEOUS) IMPLANT
MARKER SKIN DUAL TIP RULER LAB (MISCELLANEOUS) ×1 IMPLANT
NDL HYPO 25GX1X1/2 BEV (NEEDLE) ×1 IMPLANT
NDL SPNL 22GX3.5 QUINCKE BK (NEEDLE) ×1 IMPLANT
NEEDLE HYPO 25GX1X1/2 BEV (NEEDLE) ×2 IMPLANT
NEEDLE SPNL 22GX3.5 QUINCKE BK (NEEDLE) ×2 IMPLANT
NS IRRIG 1000ML POUR BTL (IV SOLUTION) ×7 IMPLANT
OBTURATOR OPTICAL STANDARD 8MM (TROCAR)
OBTURATOR OPTICAL STND 8 DVNC (TROCAR)
OBTURATOR OPTICALSTD 8 DVNC (TROCAR) IMPLANT
PACK CHEST (CUSTOM PROCEDURE TRAY) ×2 IMPLANT
PAD ARMBOARD 7.5X6 YLW CONV (MISCELLANEOUS) ×4 IMPLANT
PAD ELECT DEFIB RADIOL ZOLL (MISCELLANEOUS) IMPLANT
PASSER SUT SWANSON 36MM LOOP (INSTRUMENTS) IMPLANT
PROGEL SPRAY TIP 11IN (MISCELLANEOUS) ×2
RELOAD STAPLE 45 3.5 BLU DVNC (STAPLE) IMPLANT
RELOAD STAPLE 45 4.3 GRN DVNC (STAPLE) IMPLANT
RELOAD STAPLER 3.5X45 BLU DVNC (STAPLE) ×13 IMPLANT
RELOAD STAPLER 4.3X45 GRN DVNC (STAPLE) ×11 IMPLANT
SCISSORS LAP 5X35 DISP (ENDOMECHANICALS) IMPLANT
SEAL CANN UNIV 5-8 DVNC XI (MISCELLANEOUS) ×3 IMPLANT
SEAL XI 5MM-8MM UNIVERSAL (MISCELLANEOUS) ×6
SEALANT PROGEL (MISCELLANEOUS) ×1 IMPLANT
SEALANT SURG COSEAL 4ML (VASCULAR PRODUCTS) IMPLANT
SEALANT SURG COSEAL 8ML (VASCULAR PRODUCTS) IMPLANT
SET TRI-LUMEN FLTR TB AIRSEAL (TUBING) ×2 IMPLANT
SET TUBE SMOKE EVAC HIGH FLOW (TUBING) ×2 IMPLANT
SHEET MEDIUM DRAPE 40X70 STRL (DRAPES) ×2 IMPLANT
SOLUTION ELECTROLUBE (MISCELLANEOUS) ×1 IMPLANT
SPONGE INTESTINAL PEANUT (DISPOSABLE) ×1 IMPLANT
SPONGE LAP 18X18 RF (DISPOSABLE) ×3 IMPLANT
SPONGE TONSIL TAPE 1 RFD (DISPOSABLE) ×2 IMPLANT
STAPLER 45 DA VINCI SURE FORM (STAPLE) ×4
STAPLER 45 SUREFORM DVNC (STAPLE) IMPLANT
STAPLER CANNULA SEAL DVNC XI (STAPLE) ×2 IMPLANT
STAPLER CANNULA SEAL XI (STAPLE) ×4
STAPLER RELOAD 3.5X45 BLU DVNC (STAPLE) ×13
STAPLER RELOAD 3.5X45 BLUE (STAPLE) ×26
STAPLER RELOAD 4.3X45 GREEN (STAPLE) ×22
STAPLER RELOAD 4.3X45 GRN DVNC (STAPLE) ×11
STAPLER VISISTAT 35W (STAPLE) IMPLANT
STOPCOCK 4 WAY LG BORE MALE ST (IV SETS) ×2 IMPLANT
SUT BONE WAX W31G (SUTURE) ×1 IMPLANT
SUT PDS AB 3-0 SH 27 (SUTURE) IMPLANT
SUT PROLENE 2 0 MH 48 (SUTURE) IMPLANT
SUT PROLENE 2 0 SH DA (SUTURE) IMPLANT
SUT PROLENE 3 0 SH 48 (SUTURE) IMPLANT
SUT PROLENE 4 0 RB 1 (SUTURE) ×2
SUT PROLENE 4 0 SH DA (SUTURE) IMPLANT
SUT PROLENE 4-0 RB1 .5 CRCL 36 (SUTURE) ×1 IMPLANT
SUT SILK  1 MH (SUTURE) ×6
SUT SILK 1 MH (SUTURE) ×2 IMPLANT
SUT SILK 1 TIES 10X30 (SUTURE) ×3 IMPLANT
SUT SILK 2 0 SH (SUTURE) ×1 IMPLANT
SUT SILK 2 0 SH CR/8 (SUTURE) IMPLANT
SUT SILK 3 0SH CR/8 30 (SUTURE) ×1 IMPLANT
SUT STEEL 6MS V (SUTURE) IMPLANT
SUT STEEL SZ 6 DBL 3X14 BALL (SUTURE) IMPLANT
SUT VIC AB 1 CTX 36 (SUTURE) ×8
SUT VIC AB 1 CTX36XBRD ANBCTR (SUTURE) ×1 IMPLANT
SUT VIC AB 2-0 CTX 36 (SUTURE) ×2 IMPLANT
SUT VIC AB 3-0 MH 27 (SUTURE) IMPLANT
SUT VIC AB 3-0 SH 27 (SUTURE) ×2
SUT VIC AB 3-0 SH 27X BRD (SUTURE) IMPLANT
SUT VIC AB 3-0 X1 27 (SUTURE) ×5 IMPLANT
SUT VICRYL 0 TIES 12 18 (SUTURE) ×2 IMPLANT
SUT VICRYL 0 UR6 27IN ABS (SUTURE) ×6 IMPLANT
SUT VICRYL 2 TP 1 (SUTURE) ×5 IMPLANT
SWAB CULTURE ESWAB REG 1ML (MISCELLANEOUS) IMPLANT
SYR 10ML LL (SYRINGE) ×2 IMPLANT
SYR 20ML LL LF (SYRINGE) ×4 IMPLANT
SYR 50ML LL SCALE MARK (SYRINGE) ×2 IMPLANT
SYR BULB IRRIG 60ML STRL (SYRINGE) ×1 IMPLANT
SYSTEM RETRIEVAL ANCHOR 12 (MISCELLANEOUS) ×3 IMPLANT
SYSTEM RETRIEVAL ANCHOR 15 (MISCELLANEOUS) ×1 IMPLANT
SYSTEM SAHARA CHEST DRAIN ATS (WOUND CARE) ×2 IMPLANT
TAPE CLOTH 4X10 WHT NS (GAUZE/BANDAGES/DRESSINGS) ×2 IMPLANT
TAPE CLOTH SURG 4X10 WHT LF (GAUZE/BANDAGES/DRESSINGS) ×1 IMPLANT
TIP APPLICATOR SPRAY EXTEND 16 (VASCULAR PRODUCTS) IMPLANT
TIP SPRAY PROGEL 11IN (MISCELLANEOUS) IMPLANT
TOWEL GREEN STERILE (TOWEL DISPOSABLE) ×4 IMPLANT
TOWEL GREEN STERILE FF (TOWEL DISPOSABLE) ×2 IMPLANT
TRAP SPECIMEN MUCUS 40CC (MISCELLANEOUS) ×2 IMPLANT
TRAY FOLEY MTR SLVR 16FR STAT (SET/KITS/TRAYS/PACK) ×2 IMPLANT
TRAY FOLEY SLVR 16FR TEMP STAT (SET/KITS/TRAYS/PACK) IMPLANT
TROCAR BLADELESS 15MM (ENDOMECHANICALS) IMPLANT
TROCAR XCEL 12X100 BLDLESS (ENDOMECHANICALS) ×2 IMPLANT
TROCAR XCEL BLADELESS 5X75MML (TROCAR) IMPLANT
TUBING EXTENTION W/L.L. (IV SETS) IMPLANT
WATER STERILE IRR 1000ML POUR (IV SOLUTION) ×2 IMPLANT

## 2020-04-27 NOTE — Anesthesia Procedure Notes (Signed)
Arterial Line Insertion Start/End11/05/2020 6:50 AM, 04/27/2020 7:00 AM Performed by: Cleda Daub, CRNA, CRNA  Patient location: Pre-op. Preanesthetic checklist: patient identified, IV checked, site marked, risks and benefits discussed, surgical consent, monitors and equipment checked, pre-op evaluation, timeout performed and anesthesia consent Lidocaine 1% used for infiltration Right, radial was placed Catheter size: 20 Fr Hand hygiene performed  and maximum sterile barriers used   Attempts: 1 Procedure performed without using ultrasound guided technique. Ultrasound Notes:anatomy identified, needle tip was noted to be adjacent to the nerve/plexus identified and no ultrasound evidence of intravascular and/or intraneural injection Following insertion, dressing applied. Post procedure assessment: normal and unchanged  Patient tolerated the procedure well with no immediate complications.

## 2020-04-27 NOTE — Brief Op Note (Addendum)
04/27/2020  3:04 PM  PATIENT:  Mathew [name]  59 y.o. male  PRE-OPERATIVE DIAGNOSIS:  PLEURAL MASS PLEURAL EFFUSION  POST-OPERATIVE DIAGNOSIS:  MULTIPLE PLEURAL MASSES, PLEURAL EFFUSION  PROCEDURE:  Procedure(s): XI ROBOTIC ASSISTED RESECTION OF PLEURAL MASSES (Right) XI ROBOTIC ASSISTED THORASCOPY-WEDGE RESECTION (Right) THORACOTOMY (Right) DRAINAGE OF LARGE PLEURAL EFFUSION INTERCOSTAL NERVE BLOCK (Right)  SURGEON:  Surgeon(s) and Role:    [name], MD - Primary  PHYSICIAN ASSISTANT: [place]  ANESTHESIA:   general  EBL:  2200 mL   BLOOD ADMINISTERED:none  DRAINS: 31fr Blake and pleural tubes   LOCAL MEDICATIONS USED:  INTERCOSTAL AND PERI-INCISIONAL EXPAREL  SPECIMEN:  Source of Specimen:  MULTIPLE PLEURAL MASSES  DISPOSITION OF SPECIMEN:  PATHOLOGY  COUNTS:  YES  DICTATION: .Dragon Dictation  PLAN OF CARE: Admit to inpatient   PATIENT DISPOSITION:  PACU - hemodynamically stable.   Delay start of Pharmacological VTE agent (>24hrs) due to surgical blood loss or risk of bleeding: yes  4 separate pleural masses. Large cystic mass originating from RUL, smaller sessile parietal pleural cystic mass, inferior solid mass arising from parietal pleural and sessile smaller multi-lobulated mass.

## 2020-04-27 NOTE — Anesthesia Procedure Notes (Signed)
Central Venous Catheter Insertion Performed by: Audry Pili, MD, anesthesiologist Start/End11/05/2020 6:51 AM, 04/27/2020 7:00 AM Patient location: Pre-op. Preanesthetic checklist: patient identified, IV checked, risks and benefits discussed, surgical consent, monitors and equipment checked, pre-op evaluation, timeout performed and anesthesia consent Position: Trendelenburg Lidocaine 1% used for infiltration and patient sedated Hand hygiene performed , maximum sterile barriers used  and Seldinger technique used Catheter size: 8 Fr Central line was placed.Double lumen Procedure performed using ultrasound guided technique. Ultrasound Notes:anatomy identified, needle tip was noted to be adjacent to the nerve/plexus identified, no ultrasound evidence of intravascular and/or intraneural injection and image(s) printed for medical record Attempts: 1 Following insertion, line sutured, dressing applied and Biopatch. Post procedure assessment: blood return through all ports, free fluid flow and no air  Patient tolerated the procedure well with no immediate complications.

## 2020-04-27 NOTE — Anesthesia Procedure Notes (Addendum)
Procedure Name: Intubation Date/Time: 04/27/2020 7:46 AM Performed by: Cleda Daub, CRNA Pre-anesthesia Checklist: Patient identified, Emergency Drugs available, Suction available and Patient being monitored Patient Re-evaluated:Patient Re-evaluated prior to induction Oxygen Delivery Method: Circle system utilized Preoxygenation: Pre-oxygenation with 100% oxygen Induction Type: IV induction Ventilation: Mask ventilation without difficulty and Oral airway inserted - appropriate to patient size Laryngoscope Size: Mac and 3 Grade View: Grade I Tube type: Oral Endobronchial tube: Left, Double lumen EBT, EBT position confirmed by fiberoptic bronchoscope and EBT position confirmed by auscultation Airway Equipment and Method: Stylet and Fiberoptic brochoscope Placement Confirmation: ETT inserted through vocal cords under direct vision,  positive ETCO2 and breath sounds checked- equal and bilateral Tube secured with: Tape Dental Injury: Teeth and Oropharynx as per pre-operative assessment

## 2020-04-27 NOTE — Transfer of Care (Signed)
Immediate Anesthesia Transfer of Care Note  Patient: Mathew [name]  Procedure(s) Performed: XI ROBOTIC ASSISTED RESECTION OF PLEURAL MASS (Right Chest) XI ROBOTIC ASSISTED THORASCOPY-WEDGE RESECTION (Right Chest) THORACOTOMY (Right Chest) INTERCOSTAL NERVE BLOCK (Right Chest)  Patient Location: PACU  Anesthesia Type:General  Level of Consciousness: drowsy  Airway & Oxygen Therapy: Patient Spontanous Breathing and Patient connected to face mask oxygen  Post-op Assessment: Report given to RN and Post -op Vital signs reviewed and stable  Post vital signs: Reviewed and stable  Last Vitals:  Vitals Value Taken Time  BP 158/92 04/27/20 1538  Temp    Pulse 80 04/27/20 1545  Resp 15 04/27/20 1545  SpO2 100 % 04/27/20 1545  Vitals shown include unvalidated device data.  Last Pain:  Vitals:   04/27/20 0617  TempSrc:   PainSc: 2       Patients Stated Pain Goal: 2 (46/56/81 2751)  Complications: No complications documented.

## 2020-04-27 NOTE — Anesthesia Postprocedure Evaluation (Signed)
Anesthesia Post Note  Patient: Mathew [name]  Procedure(s) Performed: XI ROBOTIC ASSISTED RESECTION OF PLEURAL MASS (Right Chest) XI ROBOTIC ASSISTED THORASCOPY-WEDGE RESECTION (Right Chest) THORACOTOMY (Right Chest) INTERCOSTAL NERVE BLOCK (Right Chest)     Patient location during evaluation: PACU Anesthesia Type: General Level of consciousness: awake and alert Pain management: pain level controlled Vital Signs Assessment: post-procedure vital signs reviewed and stable Respiratory status: spontaneous breathing, nonlabored ventilation and respiratory function stable Cardiovascular status: blood pressure returned to baseline and stable Postop Assessment: no apparent nausea or vomiting Anesthetic complications: no   No complications documented.  Last Vitals:  Vitals:   04/27/20 1540 04/27/20 1555  BP: (!) 158/92 (!) 149/83  Pulse: 70 76  Resp: 17 15  Temp: (!) 35.9 C   SpO2: 100% 95%    Last Pain:  Vitals:   04/27/20 1540  TempSrc:   PainSc: [place]

## 2020-04-27 NOTE — Interval H&P Note (Signed)
History and Physical Interval Note:  04/27/2020 7:06 AM  Mathew [name]  has presented today for surgery, with the diagnosis of PLEURAL MASS PLEURAL EFFUSION.  The various methods of treatment have been discussed with the patient and family. After consideration of risks, benefits and other options for treatment, the patient has consented to  Procedure(s): XI ROBOTIC ASSISTED RESECTION OF PLEURAL MASS (Right) possible XI ROBOTIC ASSISTED THORASCOPY-LUNG RESECTION (Right) possible THORACOTOMY (Right) as a surgical intervention.  The patient's history has been reviewed, patient examined, no change in status, stable for surgery.  I have reviewed the patient's chart and labs.  Questions were answered to the patient's satisfaction.     [name]

## 2020-04-28 ENCOUNTER — Inpatient Hospital Stay (HOSPITAL_COMMUNITY): Payer: BLUE CROSS/BLUE SHIELD

## 2020-04-28 ENCOUNTER — Encounter (HOSPITAL_COMMUNITY): Payer: Self-pay | Admitting: Thoracic Surgery (Cardiothoracic Vascular Surgery)

## 2020-04-28 LAB — CBC
HCT: 33.4 % — ABNORMAL LOW (ref 39.0–52.0)
Hemoglobin: 10.7 g/dL — ABNORMAL LOW (ref 13.0–17.0)
MCH: 30 pg (ref 26.0–34.0)
MCHC: 32 g/dL (ref 30.0–36.0)
MCV: 93.6 fL (ref 80.0–100.0)
Platelets: 208 10*3/uL (ref 150–400)
RBC: 3.57 MIL/uL — ABNORMAL LOW (ref 4.22–5.81)
RDW: 12 % (ref 11.5–15.5)
WBC: 12.9 10*3/uL — ABNORMAL HIGH (ref 4.0–10.5)
nRBC: 0 % (ref 0.0–0.2)

## 2020-04-28 LAB — BLOOD GAS, ARTERIAL
Acid-Base Excess: 3.5 mmol/L — ABNORMAL HIGH (ref 0.0–2.0)
Bicarbonate: 28 mmol/L (ref 20.0–28.0)
FIO2: 28
O2 Saturation: 98.9 %
Patient temperature: 37
pCO2 arterial: 46.9 mmHg (ref 32.0–48.0)
pH, Arterial: 7.393 (ref 7.350–7.450)
pO2, Arterial: 146 mmHg — ABNORMAL HIGH (ref 83.0–108.0)

## 2020-04-28 LAB — BASIC METABOLIC PANEL
Anion gap: 7 (ref 5–15)
BUN: 13 mg/dL (ref 8–23)
CO2: 26 mmol/L (ref 22–32)
Calcium: 8.3 mg/dL — ABNORMAL LOW (ref 8.9–10.3)
Chloride: 101 mmol/L (ref 98–111)
Creatinine, Ser: 1.08 mg/dL (ref 0.61–1.24)
GFR, Estimated: >60 mL/min (ref >60)
Glucose, Bld: 126 mg/dL — ABNORMAL HIGH (ref 70–99)
Potassium: 4.4 mmol/L (ref 3.5–5.1)
Sodium: 134 mmol/L — ABNORMAL LOW (ref 135–145)

## 2020-04-28 MED ORDER — ENOXAPARIN SODIUM 40 MG/0.4ML ~~LOC~~ SOLN
40.0000 mg | Freq: Every day | SUBCUTANEOUS | Status: DC
  Administered 2020-04-28 – 2020-04-29 (×2): 40 mg via SUBCUTANEOUS
  Filled 2020-04-28 (×2): qty 0.4

## 2020-04-28 NOTE — Plan of Care (Signed)

## 2020-04-28 NOTE — Progress Notes (Signed)
1 Day Post-Op Procedure(s) (LRB): XI ROBOTIC ASSISTED RESECTION OF PLEURAL MASS (Right) XI ROBOTIC ASSISTED THORASCOPY-WEDGE RESECTION (Right) THORACOTOMY (Right) INTERCOSTAL NERVE BLOCK (Right) Subjective: Some incisional pain  Objective: Vital signs in last 24 hours: Temp:  [96.7 F (35.9 C)-98.6 F (37 C)] 98.6 F (37 C) (11/13 0300) Pulse Rate:  [64-80] 64 (11/13 0300) Cardiac Rhythm: Normal sinus rhythm (11/12 2000) Resp:  [10-22] 18 (11/13 0300) BP: (139-172)/(70-92) 139/85 (11/13 0300) SpO2:  [93 %-100 %] 99 % (11/13 0300) Arterial Line BP: (183-206)/(72-87) 195/72 (11/12 1640)  Hemodynamic parameters for last 24 hours:    Intake/Output from previous day: 11/12 0701 - 11/13 0700 In: 4190 [P.O.:240; I.V.:3100; IV Piggyback:850] Out: 6734 [Urine:1008; Blood:2200; Chest Tube:346] Intake/Output this shift: No intake/output data recorded.  General appearance: alert, cooperative and no distress Neurologic: intact Heart: regular rate and rhythm Lungs: clear to auscultation bilaterally Abdomen: normal findings: soft, non-tender no air leak, serosanguinous drainage from tube  Lab Results: Recent Labs    04/25/20 1014 04/27/20 0800 04/27/20 1417 04/28/20 0419  WBC 7.3  --   --  12.9*  HGB 13.4   < > 10.2* 10.7*  HCT 41.9   < > 30.0* 33.4*  PLT 226  --   --  208   < > = values in this interval not displayed.   BMET:  Recent Labs    04/25/20 1014 04/27/20 0800 04/27/20 1417 04/28/20 0419  NA 137   < > 136 134*  K 3.9   < > 4.3 4.4  CL 104  --   --  101  CO2 24  --   --  26  GLUCOSE 91  --   --  126*  BUN 12  --   --  13  CREATININE 0.98  --   --  1.08  CALCIUM 9.0  --   --  8.3*   < > = values in this interval not displayed.    PT/INR:  Recent Labs    04/25/20 1014  LABPROT 14.3  INR 1.2   ABG    Component Value Date/Time   PHART 7.393 04/28/2020 0400   HCO3 28.0 04/28/2020 0400   TCO2 29 04/27/2020 1417   ACIDBASEDEF 1.0 04/27/2020 0800    O2SAT 98.9 04/28/2020 0400   CBG (last 3)  No results for input(s): GLUCAP in the last 72 hours.  Assessment/Plan: S/P Procedure(s) (LRB): XI ROBOTIC ASSISTED RESECTION OF PLEURAL MASS (Right) XI ROBOTIC ASSISTED THORASCOPY-WEDGE RESECTION (Right) THORACOTOMY (Right) INTERCOSTAL NERVE BLOCK (Right) POD # 1  Redo thoracotomy/ Robotic resection of pleural masses Looks amazingly good PCA for pain control Ct with no air leak- will keep both tubes, place to water seal SCD + enoxaparin for DVT prophylaxis Anemia secondary to ABL mild, follow Ambulate    LOS: 1 day    [name] 04/28/2020

## 2020-04-28 NOTE — Op Note (Signed)
NAME: Mathew [name], Mathew [name]. MEDICAL RECORD KG:[id number] ACCOUNT [id number] DATE OF BIRTH:1960/07/31 FACILITY: [facility] LOCATION: [facility] PHYSICIAN:Haile Bosler Chaya Jan, MD  OPERATIVE REPORT  DATE OF PROCEDURE:  04/27/2020  PREOPERATIVE DIAGNOSES:  Pleural mass and pleural effusion with prior solitary fibrous tumor of the pleura.  POSTOPERATIVE DIAGNOSES:  Multiple pleural masses and pleural effusion.  PROCEDURE:  Xi robotic-assisted thoracotomy for drainage of large pleural effusion, resection of multiple pleural masses and a wedge resection of lung.  Intercostal nerve blocks with bupivacaine.  SURGEON:  Modesto Charon, MD  ASSISTANT:  Enid Cutter, PA-C  ANESTHESIA:  General.  FINDINGS:  Approximately 1.5 liters of serous pleural fluid.  Masses adherent to lung and a large cystic mass superiorly arising from the visceral pleura of the right upper lobe.  A pedunculated more solid mass inferiorly and then 2 separate cystic  masses arising from the parietal pleura, one relatively large and one much smaller.  No mass arising from prior site, but scar tissue in that area excised.  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  He has been followed since that time without any suspicious findings.  Recently, he had a repeat CT, which showed a large  right pleural effusion and a probable pleural-based mass inferiorly.  A PET/CT was performed which showed a 5.3 x 3.7 cm mass with hypermetabolic activity with an SUV of 8.3.  There was a large partially loculated effusion on PET, which in large part  turned out to be cystic masses at the time of surgery.  He was advised to undergo surgical resection.  The indications, risks, benefits, and alternatives were discussed in detail with the patient.  He understood and accepted the risks and agreed to  proceed.  OPERATIVE NOTE:  Mathew [name] was brought to the  preoperative holding area on 04/27/2020.  Anesthesia placed an arterial blood pressure monitoring line and established central venous access.  He was taken to the operating room, anesthetized and intubated  with a double lumen endotracheal tube.  Intravenous antibiotics were administered.  Sequential compression devices were placed on the calves for DVT prophylaxis.  A Foley catheter was placed.  He was placed in a left lateral decubitus position and the  right chest was prepped and draped in the usual sterile fashion.  Single lung ventilation of the left lung was initiated and was tolerated well throughout the procedure.  A timeout was performed.  A solution containing 50 mL of saline, 20 mL of liposomal bupivacaine and 30 mL of 0.5% bupivacaine was prepared.  This was used for nerve blocks and injection at the incisions.  An incision was made in the eighth interspace in  the mid axillary line.  The chest was entered bluntly using a hemostat.  Approximately a liter of serous fluid was evacuated.  This was sent for cytology.  The scope was inserted and there was residual fluid.  The remainder of the fluid was evacuated.   The total was about 1500 mL of fluid.  There was a large mass posteriorly and the adhesions of the lung to the chest wall and this posterior mass.  Subsequently after dissecting the lung off the mass, it was noted that there were actually multiple  masses.  There were 4 separate masses, one was very small, one was nearly completely solid.  Two more cystic masses had the appearance of loculated pleural effusion on the scans, but were  in fact masses; one of those arose from the chest wall, the other  arose from the visceral pleura on the right upper lobe.  It was clear given the size of the masses that a thoracotomy would be needed if no other reason than to remove them.  The patient's previous incision was reopened.  The latissimus muscle was  divided.  The serratus was spared and retracted  anteriorly and superiorly.  The chest was entered one interspace above the previous incision.  There was very limited mobility of the chest wall.  The inferior rib was cut posteriorly, which helped a little  bit.  There still was very little exposure.  Rather than lengthening this incision, plan was to do as much the procedure as possible robotically and use this incision to do whatever needed to be done directly.  Additional incisions were made in the  eighth interspace anteriorly and posterior to the camera port.  12 mm ports were placed through these incisions and the robotic instruments were inserted.  The central port was used for the scope.  The dissection then was begun.  There were extensive adhesions of the  masses to the lung and there were adhesions of the middle lobe to the chest wall.  These were taken down.  The tip of the middle lobe that had been adhesed had a slightly unusual appearance.  A wedge resection was performed and this was sent for permanent  pathology.  As the dissection was carried out, the planes were at times indistinct.  The lung was essentially stapled off of the masses posteriorly.  At this point, it was still not clear whether there was a single mass or multiple masses, but it later  was determined that there were multiple separate masses.  There were particularly significant adhesions along the right upper lobe.  Adhesions of the right upper lobe to the mediastinal pleura were taken down.  These were thin filmy adhesions that did  not appear suspicious.  Adhesions also were taken down at the apex and the previous site at the apex, where the prior solitary fibrous tumor had arisen, was inspected.  There was firm scar tissue in this area.  No definite mass and this was not in  continuity with any of the masses that were present currently.  There was some bleeding from the right upper lobe as this was taken down.  Dissection was begun on the chest wall with an extrapleural  plane developed using bipolar cautery.  As dissection  was carried posteriorly, it then became  apparent that there actually were separate tumor masses as previously described.  The largest superior cystic mass was dissected off the chest wall rather easily.  It was then noted that it actually arose from the  posterior aspect of the visceral pleura of the right upper lobe.  This was removed with a wedge resection, leaving a small portion of the right upper lobe en bloc.  Sequential firings of the robotic stapler were used.  More inferiorly, there was a very  small separate multilobulated mass that was sessile.  It was removed with bipolar cautery and sent as a separate specimen.  The more solid mass was pedunculated.  It was resected including its stalk, placed into an endoscopic retrieval bag and removed.   The fourth mass was a cystic mass that arose, or that appeared to at least arise from the parietal pleura.  That cystic mass and the larger mass were both removed.  All were  sent for permanent pathology.  A wedge resection was performed at the apex of the  area of the lung that was in continuity with the previous scar as there was some thickening of the pleura in that area.  The chest was copiously irrigated with warm saline on multiple occasions.  Hemostasis was achieved, but there was significant blood  loss over the course of the procedure, although he remained hemodynamically stable and his hematocrit remained satisfactory.  He did not require blood transfusions.  A test inflation of the lungs showed some air leak from the area of the lung  in the right upper lobe.  It was elected to suture this area rather than try to staple it due to the anatomy.  A thorough inspection was made of the pleural space and no other additional masses were noted.  The robotic instruments were removed.  The  tear in the right upper lobe was oversewn with a running 3-0 Vicryl suture and Progel was applied.  Chest was again  copiously irrigated with warm saline.  All sponges that had been inserted during the procedure were removed and the counts were correct.   The rib that was cut posteriorly was reapproximated with #2 Vicryl suture.  Since there had been minimal rib spreading, pericostal sutures were not necessary.  A 28-French Blake drain and 28-French chest tube were placed through separate incisions.   These were placed through the port incision that had been used for the robotic ports.  They were secured with #1 silk sutures.  Dual-lung ventilation was resumed.  The remaining port incisions were closed in standard fashion.  The thoracotomy was closed  in standard fashion as well.  The chest tubes were placed to a Pleur-evac on suction.  The patient was placed back in supine position.  He was extubated in the operating room and taken to the [facility] in good condition.  All sponge,  needle and instrument counts were correct.  IN/NUANCE  D:04/27/2020 T:04/28/2020 JOB:[id number]

## 2020-04-29 ENCOUNTER — Inpatient Hospital Stay (HOSPITAL_COMMUNITY): Payer: BLUE CROSS/BLUE SHIELD

## 2020-04-29 LAB — CBC
HCT: 30.7 % — ABNORMAL LOW (ref 39.0–52.0)
Hemoglobin: 9.9 g/dL — ABNORMAL LOW (ref 13.0–17.0)
MCH: 30.8 pg (ref 26.0–34.0)
MCHC: 32.2 g/dL (ref 30.0–36.0)
MCV: 95.6 fL (ref 80.0–100.0)
Platelets: 175 10*3/uL (ref 150–400)
RBC: 3.21 MIL/uL — ABNORMAL LOW (ref 4.22–5.81)
RDW: 11.9 % (ref 11.5–15.5)
WBC: 8.8 10*3/uL (ref 4.0–10.5)
nRBC: 0 % (ref 0.0–0.2)

## 2020-04-29 LAB — COMPREHENSIVE METABOLIC PANEL
ALT: 31 U/L (ref 0–44)
AST: 84 U/L — ABNORMAL HIGH (ref 15–41)
Albumin: 2.1 g/dL — ABNORMAL LOW (ref 3.5–5.0)
Alkaline Phosphatase: 57 U/L (ref 38–126)
Anion gap: 5 (ref 5–15)
BUN: 13 mg/dL (ref 8–23)
CO2: 30 mmol/L (ref 22–32)
Calcium: 8.3 mg/dL — ABNORMAL LOW (ref 8.9–10.3)
Chloride: 100 mmol/L (ref 98–111)
Creatinine, Ser: 1.1 mg/dL (ref 0.61–1.24)
GFR, Estimated: >60 mL/min (ref >60)
Glucose, Bld: 122 mg/dL — ABNORMAL HIGH (ref 70–99)
Potassium: 4.1 mmol/L (ref 3.5–5.1)
Sodium: 135 mmol/L (ref 135–145)
Total Bilirubin: 0.6 mg/dL (ref 0.3–1.2)
Total Protein: 5 g/dL — ABNORMAL LOW (ref 6.5–8.1)

## 2020-04-29 NOTE — Plan of Care (Signed)

## 2020-04-29 NOTE — Hospital Course (Signed)
HPI: [name] is a 59 year old gentleman with a past medical history for asthma, arthritis, reflux, and a solitary fibrous tumor of the pleura.  In 2017 he had a 12 cm solitary fibrous tumor removed.  He has been followed with CT scans since then.  A year ago there is no evidence of recurrent disease.  Most recently he has had more shortness of breath with intense activity.  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  His CT scan now shows a right pleural effusion with a probable mass in the inferior pleural space.  He has undergone full evaluation and it was felt that he should undergo resection and was admitted this hospitalization for the procedure.  Hospital course  On 04/28/2019 when he was taken the operating room where he underwent robotic assisted thoracotomy for drainage of large pleural effusion, resection of multiple pleural masses and a wedge resection of lung.  Intercostal nerve blocks were also used.  He tolerated the procedure well and was taken to the postanesthesia care unit in stable condition.  Postoperative hospital course:  The patient has done well.  On postop day 1 his chest tube had no air leak and both tubes were placed to waterseal.  He does have a mild acute blood loss anemia which is being followed.  He initially used the PCA for pain control.  On postop day #2 the anterior chest tube was removed.  The PCA was discontinued and he was transitioned to p.o. pain meds as needed.  Pathology is currently pending.  He is tolerating oral diet and advancing activities.

## 2020-04-29 NOTE — Plan of Care (Signed)
  Problem: Education: Goal: Knowledge of disease or condition will improve Outcome: Progressing Goal: Knowledge of the prescribed therapeutic regimen will improve Outcome: Progressing   Problem: Activity: Goal: Risk for activity intolerance will decrease Outcome: Progressing   Problem: Education: Goal: Knowledge of disease or condition will improve Outcome: Progressing Goal: Knowledge of the prescribed therapeutic regimen will improve Outcome: Progressing   Problem: Cardiac: Goal: Will achieve and/or maintain hemodynamic stability Outcome: Progressing   Problem: Clinical Measurements: Goal: Postoperative complications will be avoided or minimized Outcome: Progressing   Problem: Respiratory: Goal: Respiratory status will improve Outcome: Progressing   Problem: Pain Management: Goal: Pain level will decrease Outcome: Progressing   Problem: Skin Integrity: Goal: Wound healing without signs and symptoms infection will improve Outcome: Progressing   Problem: Education: Goal: Knowledge of General Education information will improve Description: Including pain rating scale, medication(s)/side effects and non-pharmacologic comfort measures Outcome: Progressing   Problem: Health Behavior/Discharge Planning: Goal: Ability to manage health-related needs will improve Outcome: Progressing   Problem: Clinical Measurements: Goal: Ability to maintain clinical measurements within normal limits will improve Outcome: Progressing Goal: Will remain free from infection Outcome: Progressing Goal: Diagnostic test results will improve Outcome: Progressing Goal: Respiratory complications will improve Outcome: Progressing Goal: Cardiovascular complication will be avoided Outcome: Progressing   Problem: Activity: Goal: Risk for activity intolerance will decrease Outcome: Progressing   Problem: Nutrition: Goal: Adequate nutrition will be maintained Outcome: Progressing   Problem:  Coping: Goal: Level of anxiety will decrease Outcome: Progressing   Problem: Elimination: Goal: Will not experience complications related to bowel motility Outcome: Progressing Goal: Will not experience complications related to urinary retention Outcome: Progressing   Problem: Pain Managment: Goal: General experience of comfort will improve Outcome: Progressing   Problem: Safety: Goal: Ability to remain free from injury will improve Outcome: Progressing   Problem: Skin Integrity: Goal: Risk for impaired skin integrity will decrease Outcome: Progressing

## 2020-04-29 NOTE — Progress Notes (Signed)
2 Days Post-Op Procedure(s) (LRB): XI ROBOTIC ASSISTED RESECTION OF PLEURAL MASS (Right) XI ROBOTIC ASSISTED THORASCOPY-WEDGE RESECTION (Right) THORACOTOMY (Right) INTERCOSTAL NERVE BLOCK (Right) Subjective: No complaints this AM, nit using PCA much  Objective: Vital signs in last 24 hours: Temp:  [97.9 F (36.6 C)-98.5 F (36.9 C)] 98 F (36.7 C) (11/14 0353) Pulse Rate:  [61-90] 64 (11/14 0353) Cardiac Rhythm: Normal sinus rhythm (11/14 0701) Resp:  [16-18] 18 (11/14 0353) BP: (113-149)/(75-81) 145/81 (11/14 0353) SpO2:  [92 %-98 %] 97 % (11/14 0353)  Hemodynamic parameters for last 24 hours:    Intake/Output from previous day: 11/13 0701 - 11/14 0700 In: 121.2 [I.V.:121.2] Out: 2060 [Urine:1800; Chest Tube:260] Intake/Output this shift: No intake/output data recorded.  General appearance: alert, cooperative and no distress Neurologic: intact Heart: regular rate and rhythm Lungs: clear to auscultation bilaterally no air leak  Lab Results: Recent Labs    04/28/20 0419 04/29/20 0412  WBC 12.9* 8.8  HGB 10.7* 9.9*  HCT 33.4* 30.7*  PLT 208 175   BMET:  Recent Labs    04/28/20 0419 04/29/20 0412  NA 134* 135  K 4.4 4.1  CL 101 100  CO2 26 30  GLUCOSE 126* 122*  BUN 13 13  CREATININE 1.08 1.10  CALCIUM 8.3* 8.3*    PT/INR: No results for input(s): LABPROT, INR in the last 72 hours. ABG    Component Value Date/Time   PHART 7.393 04/28/2020 0400   HCO3 28.0 04/28/2020 0400   TCO2 29 04/27/2020 1417   ACIDBASEDEF 1.0 04/27/2020 0800   O2SAT 98.9 04/28/2020 0400   CBG (last 3)  No results for input(s): GLUCAP in the last 72 hours.  Assessment/Plan: S/P Procedure(s) (LRB): XI ROBOTIC ASSISTED RESECTION OF PLEURAL MASS (Right) XI ROBOTIC ASSISTED THORASCOPY-WEDGE RESECTION (Right) THORACOTOMY (Right) INTERCOSTAL NERVE BLOCK (Right) -POD # 2  Looks great No air leak- dc anterior Ct, will leave posterior tube one more day Not using PCA- dc and  use PO pain meds PRN SCD + enoxaparin + ambulation for DVT prophylaxis Path pending Tolerating POs    LOS: 2 days    [name] 04/29/2020

## 2020-04-30 ENCOUNTER — Inpatient Hospital Stay (HOSPITAL_COMMUNITY): Payer: BLUE CROSS/BLUE SHIELD

## 2020-04-30 MED ORDER — VITAMIN D3 50 MCG (2000 UT) PO CAPS: 2000.0000 [IU] | ORAL_CAPSULE | Freq: Every day | ORAL | 0 refills | Status: DC

## 2020-04-30 MED ORDER — TRAMADOL HCL 50 MG PO TABS
50.0000 mg | ORAL_TABLET | Freq: Four times a day (QID) | ORAL | 0 refills | Status: AC | PRN
Start: 2020-04-30 — End: 2020-05-03

## 2020-04-30 NOTE — Progress Notes (Addendum)
      [address]       [place] [address]             [phone]      3 Days Post-Op Procedure(s) (LRB): XI ROBOTIC ASSISTED RESECTION OF PLEURAL MASS (Right) XI ROBOTIC ASSISTED THORASCOPY-WEDGE RESECTION (Right) THORACOTOMY (Right) INTERCOSTAL NERVE BLOCK (Right) Subjective: Up in the bedside chair, says he feels good and is not short of breath.  Managing pain with oral Tylenol.   Remains on RA, minimal drainage form CT past 24 hours.   Objective: Vital signs in last 24 hours: Temp:  [97.8 F (36.6 C)-98.5 F (36.9 C)] 98.1 F (36.7 C) (11/15 0327) Pulse Rate:  [59-72] 59 (11/15 0327) Cardiac Rhythm: Sinus bradycardia (11/15 0700) Resp:  [15-20] 20 (11/15 0327) BP: (138-151)/(78-96) 143/87 (11/15 0327) SpO2:  [95 %-100 %] 95 % (11/15 0327)    Intake/Output from previous day: 11/14 0701 - 11/15 0700 In: -  Out: 110 [Chest Tube:110] Intake/Output this shift: No intake/output data recorded.  General appearance: alert, cooperative and no distress Neurologic: intact Heart: regular rate and rhythm Lungs: breath sounds clear, no air leak, no drainage recorded from CT for past 12 hours. CXR shows both lungs well expanded.  Wound: Chest incisions covered with dry dressings.   Lab Results: Recent Labs    04/28/20 0419 04/29/20 0412  WBC 12.9* 8.8  HGB 10.7* 9.9*  HCT 33.4* 30.7*  PLT 208 175   BMET:  Recent Labs    04/28/20 0419 04/29/20 0412  NA 134* 135  K 4.4 4.1  CL 101 100  CO2 26 30  GLUCOSE 126* 122*  BUN 13 13  CREATININE 1.08 1.10  CALCIUM 8.3* 8.3*    PT/INR: No results for input(s): LABPROT, INR in the last 72 hours. ABG    Component Value Date/Time   PHART 7.393 04/28/2020 0400   HCO3 28.0 04/28/2020 0400   TCO2 29 04/27/2020 1417   ACIDBASEDEF 1.0 04/27/2020 0800   O2SAT 98.9 04/28/2020 0400   CBG (last 3)  No results for input(s): GLUCAP in the last 72 hours.  Assessment/Plan: S/P Procedure(s) (LRB): XI  ROBOTIC ASSISTED RESECTION OF PLEURAL MASS (Right) XI ROBOTIC ASSISTED THORASCOPY-WEDGE RESECTION (Right) THORACOTOMY (Right) INTERCOSTAL NERVE BLOCK (Right)  -POD-3 right robotic-assisted resection of multiple pleural masses.  Stable respiratory status, no air leak, minimal drainage. Plan to remove the remaining tube today. Possible discharge this afternoon. Discuss plans with Dr. Roxan Hockey.   -Path is pending.    LOS: 3 days    Antony Odea, PA-C  04/30/2020 Patient seen and examined, agree with above Dc chest tube Home this afternoon  Remo Lipps C. Roxan Hockey, MD [facility] [phone]

## 2020-04-30 NOTE — Discharge Summary (Signed)
Physician Discharge Summary  Patient ID: COAL NEARHOOD MRN: [id number] DOB/AGE: 1960-11-22 58 y.o.  Admit date: 04/27/2020 Discharge date: 04/30/2020  Admission Diagnoses:  Right Pleural Mass Right Pleural Effusion  Discharge Diagnoses:   Right Pleural Mass Right Pleural Effusion S/P robotic-assisted right thoracotomy with resection of multiple pleural masses   Discharged Condition: stable  History of Present Illness: Mathew [name] is a 22-year man with a past medical history significant for asthma, arthritis, reflux, and a solitary fibrous tumor of the pleura.  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  He was found to have a 12 cm mass in the chest.  That turned out to be a solitary fibrous tumor of the pleura.  It did not have significant mitotic activity or pleomorphism, but due to the large size has been followed since then.  He continues to compete in [employer].  He recently won a competition.  He has been having a little bit more shortness of breath with intense activity.  He is not had any change in appetite or weight loss.  Occasional cough.  Hospital Course:   [name] was admitted for elective surgery on 04/27/2020.  He was prepared and taken to the operating room where right robotic assisted thoracoscopy was carried out.  Multiple pleural-based tumors were identified and resected.  Following the procedure, he was extubated and transferred to the postanesthesia care unit.  Mathew [name] remained hemodynamically stable and his respiratory status remained stable.  He was transferred to [facility] progressive care.  He did not have a significant air leak.  The chest tubes were left to waterseal.  Follow-up chest x-rays showed good expansion of both lungs.  One of the chest tubes was removed on postop day 2.  There was minimal drainage following its removal and no evidence of  pneumothorax so the second tube was removed on postop day 3.  Follow-up chest x-ray was again satisfactory with a very small apical lateral pneumothorax of less than 1 cm in width.  [name] was mobilized early postoperatively.  He had no difficulty being weaned from supplemental oxygen.  He had a return of appropriate bowel bladder function.  His pain was well controlled with oral Tylenol at the time of his discharge.  The port incisions and the mini thoracotomy incision were all intact and healing with no sign of complication at the time of discharge.  PATH: Pending at the time of discharge  Consults: None  Significant Diagnostic Studies:   CT CHEST WITHOUT CONTRAST  TECHNIQUE: Multidetector CT imaging of the chest was performed following the standard protocol without IV contrast.  COMPARISON: 04/04/2019  FINDINGS: Cardiovascular: No significant aortic atherosclerosis. No aneurysmal dilation of the thoracic aorta. Normal heart size with scattered LEFT coronary calcifications. No pericardial effusion. Central pulmonary vasculature is of normal caliber. Limited assessment of cardiovascular structures given lack of intravenous contrast.  Mediastinum/Nodes: Esophagus is grossly normal. No gross hilar adenopathy on limited assessment without contrast. No mediastinal adenopathy. No axillary adenopathy.  Lungs/Pleura: Interval development of large RIGHT-sided pleural effusion in the posterior RIGHT chest. This appears partially loculated in this patient post pleural tumor resection. Density is heterogeneous measuring approximately 35 Hounsfield units showing convex margins along its anterior aspect in the upper chest best seen on image 82 of series 2. Also on sagittal images ovoid lesion suggested in this location occupying the upper margin of the "pleural  collection", on sagittal images, image is 58 of series 6 this measures approximately 11 x 6 cm and displaces  postoperative changes and lung anteriorly.  Discrete area of increased density in the dependent aspect measuring 5.1 x 2.8 cm (image 120 of series 2)  Pleural fluid in the RIGHT posterior costodiaphragmatic recess and beneath the RIGHT lung measures lower density, more compatible with simple fluid.  Tiny pulmonary nodule in the RIGHT middle lobe on image 116 of series 8 measuring approximately 3 mm is not changed. Postoperative changes in the posterior RIGHT chest are again noted, displaced anteriorly due to effusion and associated basilar collapse in the RIGHT chest. Large airways are patent. Minimal atelectasis at the LEFT base.  Upper Abdomen: Incidental imaging of upper abdominal contents is unremarkable.  Musculoskeletal: No acute musculoskeletal process. Signs of RIGHT thoracotomy along the RIGHT lateral chest wall  IMPRESSION: 1. Interval development of large RIGHT-sided pleural effusion with areas of soft tissue density. Strongly suggestive of large area of recurrent tumor in the RIGHT chest as described. Contrasted imaging will be helpful to differentiate recurrent tumor from mixed density material such as blood products that could display similar density on noncontrast imaging. 2. Associated more simple appearing fluid density in the inferior RIGHT chest below areas described. 3. Basilar volume loss associated with above findings. 4. Stable 3 mm pulmonary nodule in the RIGHT middle lobe. 5. Coronary calcifications. 6. A call is out to the referring provider to further discuss findings in the above case.  Electronically Signed: By: Zetta Bills M.D. On: 04/04/2020 10:32 ADDENDUM: Contrasted imaging as stated previously could be considered for further assessment to better delineate the above findings and assess for signs of enhancing soft tissue.  These results were called by telephone at the time of interpretation on 04/04/2020 at 10:38 am to provider  [facility] , who verbally acknowledged these results.   Electronically Signed By: Zetta Bills M.D. On: 04/04/2020 10:39  Treatments:   OPERATIVE REPORT  DATE OF PROCEDURE:  04/27/2020  PREOPERATIVE DIAGNOSES:  Pleural mass and pleural effusion with prior solitary fibrous tumor of the pleura.  POSTOPERATIVE DIAGNOSES:  Multiple pleural masses and pleural effusion.  PROCEDURE:  Xi robotic-assisted thoracotomy for drainage of large pleural effusion, resection of multiple pleural masses and a wedge resection of lung.  Intercostal nerve blocks with bupivacaine.  SURGEON:  Modesto Charon, MD  ASSISTANT:  Enid Cutter, PA-C  ANESTHESIA:  General.  FINDINGS:  Approximately 1.5 liters of serous pleural fluid.  Mass is adherent to lung and a large cystic mass superiorly arising from the visceral pleura of the right upper lobe.  A pedunculated more solid mass inferiorly and then 2 separate cystic  masses arising from the parietal pleura, one relatively large and one much smaller.  No mass arising from prior site, but scar tissue in that area excised.  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  He has been followed since that time without any suspicious findings.  Recently, he had a repeat CT, which showed a large  right pleural effusion and a probable pleural-based mass inferiorly.  A PET/CT was performed which showed a 5.3 x 3.7 cm mass with hypermetabolic activity with an SUV of 8.3.  There was a large partially loculated effusion on PET, which in large part  turned out to be cystic masses at the time of surgery.  He was advised to undergo  surgical resection.  The indications, risks, benefits, and alternatives were discussed in detail with the patient.  He understood and accepted the risks and agreed to  proceed.   Discharge Exam: Blood pressure (!) 160/86, pulse (!)  59, temperature 97.8 F (36.6 C), temperature source Oral, resp. rate 20, height 5\' 9"  (1.753 m), weight 82.9 kg, SpO2 95 %.  General appearance: alert, cooperative and no distress Neurologic: intact Heart: regular rate and rhythm Lungs: breath sounds clear, no air leak, no drainage recorded from CT for past 12 hours. CXR shows both lungs well expanded.  Wound: Chest incisions covered with dry dressings  Disposition:    Allergies as of 04/30/2020      Reactions   Aspirin Shortness Of Breath   Penicillins Rash   Has patient had a PCN reaction causing immediate rash, facial/tongue/throat swelling, SOB or lightheadedness with hypotension:YES Has patient had a PCN reaction causing severe rash involving mucus membranes or skin necrosis: NO Has patient had a PCN reaction that required hospitalization NO Has patient had a PCN reaction occurring within the last 10 years: NO If all of the above answers are "NO", then may proceed with Cephalosporin use.      Medication List    TAKE these medications   Elderberry Zinc/Vit C/Immune Lozg Take 2 tablets by mouth in the morning and at bedtime. Gummie   Nac 600 600 MG Caps Generic drug: Acetylcysteine Take 600 mg by mouth daily.   Quercetin 250 MG Tabs Take 500 mg by mouth daily. 50 mg Bromelain 50 mg calcium   traMADol 50 MG tablet Commonly known as: ULTRAM Take 1-2 tablets (50-100 mg total) by mouth every 6 (six) hours as needed for up to 3 days (mild pain).   vitamin C 1000 MG tablet Take 1,000 mg by mouth daily. Raw   Vitamin D3 50 MCG (2000 UT) capsule Take 1 capsule (2,000 Units total) by mouth daily. What changed: how much to take   ZINC PO Take 1.5 mg by mouth daily.       Follow-up Information    [name], MD. Go on 05/29/2020.   Specialty: Cardiothoracic Surgery Why: Your appointment is at 1:30.  Please arrive 30 minutes early for chest x-ray to be performed by [facility] located on the first  floor of the seme building.  Contact information: [address] [place] [address] [phone]               Signed: Antony Odea, PA-C 04/30/2020, 12:45 PM

## 2020-04-30 NOTE — Discharge Instructions (Signed)
Discharge Instructions:  1. You may shower, please wash incisions daily with soap and water and keep dry.  If you wish to cover wounds with dressing you may do so but please keep clean and change daily.  No tub baths or swimming until incisions have completely healed.  If your incisions become red or develop any drainage please call our office at [phone]  2. No Driving until cleared by Dr. Leonarda Salon office and you are no longer using narcotic pain medications  3. Monitor your weight daily.. Please use the same scale and weigh at same time... If you gain 5-10 lbs in 48 hours with associated lower extremity swelling, please contact our office at [phone]  4. Fever of 101.5 for at least 24 hours with no source, please contact our office at [phone]  5. Activity- up as tolerated, please walk at least 3 times per day.  Avoid strenuous activity, no lifting, pushing, or pulling with your arms over 8-10 lbs for a minimum of 6 weeks  6. If any questions or concerns arise, please do not hesitate to contact our office at [facility], Care After This sheet gives you information about how to care for yourself after your procedure. Your health care provider may also give you more specific instructions. If you have problems or questions, contact your health care provider. What can I expect after the procedure? After the procedure, it is common to have:  Some pain and soreness in your chest.  Pain when breathing in (inhaling) and coughing.  Constipation.  Fatigue.  Difficulty sleeping. Follow these instructions at home: Preventing pneumonia  Take deep breaths or do breathing exercises as instructed by your health care provider. Doing this helps prevent lung infection (pneumonia).  Cough frequently. Coughing may cause discomfort, but it is important to clear mucus (phlegm) and expand your lungs. If it hurts to cough, hold a pillow against your  chest or place the palms of both hands on top of the incision (use splinting) when you cough. This may help relieve discomfort.  If you were given an incentive spirometer, use it as directed. An incentive spirometer is a tool that measures how well you are filling your lungs with each breath.  Participate in pulmonary rehabilitation as directed by your health care provider. This is a program that combines education, exercise, and support from a team of specialists. The goal is to help you heal and get back to your normal activities as soon as possible. Medicines  Take over-the-counter or prescription medicines only as told by your health care provider.  If you have pain, take pain-relieving medicine before your pain becomes severe. This is important because if your pain is under control, you will be able to breathe and cough more comfortably.  If you were prescribed an antibiotic medicine, take it as told by your health care provider. Do not stop taking the antibiotic even if you start to feel better. Activity  Ask your health care provider what activities are safe for you.  Avoid activities that use your chest muscles for at least 3-4 weeks.  Do not lift anything that is heavier than 10 lb (4.5 kg), or the limit that your health care provider tells you, until he or she says that it is safe. Incision care  Follow instructions from your health care provider about how to take care of your incision(s). Make sure you: ? Wash your hands with soap and water before you change your bandage (  dressing). If soap and water are not available, use hand sanitizer. ? Change your dressing as told by your health care provider. ? Leave stitches (sutures), skin glue, or adhesive strips in place. These skin closures may need to stay in place for 2 weeks or longer. If adhesive strip edges start to loosen and curl up, you may trim the loose edges. Do not remove adhesive strips completely unless your health care  provider tells you to do that.  Keep your dressing dry until it has been removed.  Check your incision area every day for signs of infection. Check for: ? Redness, swelling, or pain. ? Fluid or blood. ? Warmth. ? Pus or a bad smell. Bathing  Do not take baths, swim, or use a hot tub until your health care provider approves. You may take showers.  After your dressing has been removed, use soap and water to gently wash your incision area. Do not use anything else to clean your incision(s) unless your health care provider tells you to do this. Driving    Do not drive until your health care provider approves.  Do not drive or use heavy machinery while taking prescription pain medicine. Eating and drinking  Eat a healthy, balanced diet as instructed by your health care provider. A healthy diet includes plenty of fresh fruits and vegetables, whole grains, and low-fat (lean) proteins.  Limit foods that are high in fat and processed sugars, such as fried and sweet foods.  Drink enough fluid to keep your urine clear or pale yellow. General instructions    To prevent or treat constipation while you are taking prescription pain medicine, your health care provider may recommend that you: ? Take over-the-counter or prescription medicines. ? Eat foods that are high in fiber, such as beans, fresh fruits and vegetables, and whole grains.  Do not use any products that contain nicotine or tobacco, such as cigarettes and e-cigarettes. If you need help quitting, ask your health care provider.  Avoid secondhand smoke.  Wear compression stockings as told by your health care provider. These stockings help to prevent blood clots and reduce swelling in your legs.  If you have a chest tube, care for it as instructed by your health care provider. Do not travel by airplane during the 2 weeks after your chest tube is removed, or until your health care provider says that this is safe.  Keep all  follow-up visits as told by your health care provider. This is important. Contact a health care provider if:  You have redness, swelling, or pain around an incision.  You have fluid or blood coming from an incision.  Your incision area feels warm to the touch.  You have pus or a bad smell coming from an incision.  You have a fever or chills.  You have nausea or vomiting.  You have pain that does not get better with medicine. Get help right away if:  You have chest pain.  Your heart is fluttering or beating rapidly.  You develop a rash.  You have shortness of breath or trouble breathing.  You are confused.  You have trouble speaking.  You feel weak, light-headed, or dizzy.  You faint. Summary  To help prevent lung infection (pneumonia), take deep breaths or do breathing exercises as instructed by your health care provider.  Cough frequently to clear mucus (phlegm) and expand your lungs. If it hurts to cough, hold a pillow against your chest or place the palms of both hands  on top of the incision (use splinting) when you cough.  If you have pain, take pain-relieving medicine before your pain becomes severe. This is important because if your pain is under control, you will be able to breathe and cough more comfortably.  Ask your health care provider what activities are safe for you. This information is not intended to replace advice given to you by your health care provider. Make sure you discuss any questions you have with your health care provider. Document Revised: 05/15/2017 Document Reviewed: 05/12/2016 Elsevier Patient Education  2020 [employer].

## 2020-05-01 LAB — CYTOLOGY - NON PAP

## 2020-05-04 LAB — SURGICAL PATHOLOGY

## 2020-05-28 ENCOUNTER — Other Ambulatory Visit: Payer: Self-pay | Admitting: Thoracic Surgery (Cardiothoracic Vascular Surgery)

## 2020-05-28 DIAGNOSIS — R918 Other nonspecific abnormal finding of lung field: Secondary | ICD-10-CM

## 2020-05-29 ENCOUNTER — Ambulatory Visit (INDEPENDENT_AMBULATORY_CARE_PROVIDER_SITE_OTHER): Payer: Self-pay | Admitting: Thoracic Surgery (Cardiothoracic Vascular Surgery)

## 2020-05-29 ENCOUNTER — Encounter: Payer: Self-pay | Admitting: Thoracic Surgery (Cardiothoracic Vascular Surgery)

## 2020-05-29 ENCOUNTER — Ambulatory Visit
Admission: RE | Admit: 2020-05-29 | Discharge: 2020-05-29 | Disposition: A | Discharge status: Home / Self Care | Payer: BLUE CROSS/BLUE SHIELD | Source: Ambulatory Visit | Attending: Thoracic Surgery (Cardiothoracic Vascular Surgery) | Admitting: Thoracic Surgery (Cardiothoracic Vascular Surgery)

## 2020-05-29 ENCOUNTER — Other Ambulatory Visit: Payer: Self-pay

## 2020-05-29 VITALS — BP 182/108 | HR 63 | Temp 97.9°F | Resp 20 | Ht 69.0 in | Wt 175.0 lb

## 2020-05-29 DIAGNOSIS — Z9889 Other specified postprocedural states: Secondary | ICD-10-CM

## 2020-05-29 DIAGNOSIS — R918 Other nonspecific abnormal finding of lung field: Secondary | ICD-10-CM

## 2020-05-29 NOTE — Progress Notes (Signed)
[address]       [place] [address]             [phone]     HPI: Mathew [name] returns for scheduled postoperative follow-up visit  Mathew [name] is a 59 year old man with a past history significant for solitary fibrous tumor of the pleura, asthma, arthritis, and reflux.  Back in 2017 he noticed some shortness of breath with heavy exertion.  He was found to have a 12 cm mass in the chest.  I resected that and it turned out to be a solitary fibrous tumor of the pleura.  There was no significant mitotic activity or pleomorphism.  Recently he returned for 1 year follow-up and had a new large right-sided pleural effusion with areas of soft tissue density.  On PET CT there was an active pleural-based mass.  At the time of surgery there were multiple pleural-based masses.  There was no recurrence at his previous resection site.  I did a robotic assisted resection of multiple pleural tumors.  One arose from the visceral pleura and others were from the parietal pleura.  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  He did well postoperatively and went home on day 3.  He has some incisional soreness.  He is not taking any narcotics.  He is anxious to increase his physical activities.  Current Outpatient Medications  Medication Sig Dispense Refill  . Acetylcysteine (NAC 600) 600 MG CAPS Take 600 mg by mouth daily.    . Ascorbic Acid (VITAMIN C) 1000 MG tablet Take 1,000 mg by mouth daily. Raw    . Cholecalciferol (VITAMIN D3) 50 MCG (2000 UT) capsule Take 1 capsule (2,000 Units total) by mouth daily. 1 capsule 0  . Misc Natural Products (ELDERBERRY ZINC/VIT C/IMMUNE) LOZG Take 2 tablets by mouth in the morning and at bedtime. Gummie    . Multiple Vitamins-Minerals (ZINC PO) Take 1.5 mg by mouth daily.     . Quercetin 250 MG TABS Take 500 mg by mouth daily. 50 mg Bromelain 50 mg  calcium     No current facility-administered medications for this visit.    Physical Exam BP (!) 182/108 (BP Location: Left Arm, Patient Position: Sitting, Cuff Size: Normal)   Pulse 63   Temp 97.9 F (36.6 C) (Skin)   Resp 20   Ht 5\' 9"  (1.753 m)   Wt 175 lb (79.4 kg)   SpO2 100% Comment: RA  BMI 25.84 kg/m  Well-appearing 59 year old man in no acute distress Alert and oriented x3 with no focal deficits Lungs clear with good breath sounds bilaterally Incisions healing well, some mild erythema at chest tube suture site  Diagnostic Tests: CHEST - 2 VIEW  COMPARISON:  April 30, 2020  FINDINGS: Postoperative change noted on the right with scarring and volume loss on the right. Surgical clips in right apex. There is no edema or airspace opacity. Left lung clear. Heart size and pulmonary vascular normal. No adenopathy. No bone lesions.  IMPRESSION: Postoperative change with scarring and volume loss on the right. No edema or consolidation. Left lung clear. Heart size normal.   Electronically Signed   By: Mathew [name] M.D.   On: 05/29/2020 12:56 I personally reviewed the chest x-ray images and concur with the findings noted above  Impression: Mathew [name] is a 59 year old man with a past history significant for solitary fibrous tumor of the  pleura, asthma, arthritis, and reflux.  In 2017 he had resection of a 12 cm solitary fibrous tumor of the pleura.  That was a pedunculated tumor arising from the parietal pleura.  He did well until recently when CT noted a large pleural effusion.  He turned out to have multiple pleural tumors arising from both the visceral and parietal pleura.  On pathology though showed markedly different morphology from his tumor in 2017 and were consistent with malignant fibrous tumors.  From a surgical standpoint he is doing well.  He has some mild pain.  He is anxious to improve his exercise tolerance.  I advised him to take it easy  for the next couple of weeks but after the first of the year he can do anything he feels up to.  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  I am going to refer him to Dr. Julien Nordmann to see if he thinks any type of adjuvant treatment would be beneficial.  Plan: Refer to Dr. Julien Nordmann Return in 3 months  [name], MD [facility] [phone]

## 2020-05-30 ENCOUNTER — Encounter: Payer: Self-pay | Admitting: *Deleted

## 2020-05-30 DIAGNOSIS — R918 Other nonspecific abnormal finding of lung field: Secondary | ICD-10-CM

## 2020-05-30 NOTE — Progress Notes (Signed)
I received referral on [name] from Dr. Leonarda Salon office. He would like patient to be seen with Dr. Julien Nordmann. I updated new patient coordinator to call and schedule with Dr. Julien Nordmann on 12/27.

## 2020-05-31 ENCOUNTER — Telehealth: Payer: Self-pay | Admitting: Internal Medicine

## 2020-05-31 NOTE — Telephone Encounter (Signed)
Received a new pt referral from Dr. Roxan Hockey for lung cancer. [name] has been scheduled to see Dr. Julien Nordmann on 12/27 at 215pm w/labs at 145pm. I cld and lft the appt date and time on the pt's vm. Letter mailed.

## 2020-06-11 ENCOUNTER — Inpatient Hospital Stay: Payer: BLUE CROSS/BLUE SHIELD

## 2020-06-11 ENCOUNTER — Inpatient Hospital Stay: Payer: BLUE CROSS/BLUE SHIELD | Attending: Internal Medicine | Admitting: Internal Medicine

## 2020-06-13 ENCOUNTER — Telehealth: Payer: Self-pay | Admitting: *Deleted

## 2020-06-13 NOTE — Telephone Encounter (Signed)
I received a vm message from [name] stating why he was a no show to his appt with Dr. Julien Nordmann.  Due to his insurance he could not see Dr. Julien Nordmann until January.  I called [name] to re-schedule but was unable to reach. I did leave vm message with my name and phone number to call.

## 2020-06-21 ENCOUNTER — Telehealth: Payer: Self-pay | Admitting: Internal Medicine

## 2020-06-21 NOTE — Telephone Encounter (Signed)
I received a call from Mathew [name] to reschedule his new pt appt w/Dr. Julien Nordmann. Pt has been rescheduled to see Dr. Julien Nordmann on 1/19 at 2:15pm w/labs at 1:45pm. Pt aware to arrive 15 minutes early.

## 2020-07-04 ENCOUNTER — Inpatient Hospital Stay: Payer: 59 | Attending: Internal Medicine | Admitting: Internal Medicine

## 2020-07-04 ENCOUNTER — Telehealth: Payer: Self-pay | Admitting: Medical Oncology

## 2020-07-04 ENCOUNTER — Other Ambulatory Visit: Payer: Self-pay

## 2020-07-04 ENCOUNTER — Inpatient Hospital Stay: Payer: 59

## 2020-07-04 ENCOUNTER — Encounter: Payer: Self-pay | Admitting: Internal Medicine

## 2020-07-04 VITALS — BP 157/91 | HR 66 | Temp 95.1°F | Resp 15 | Ht 69.0 in | Wt 182.5 lb

## 2020-07-04 DIAGNOSIS — R918 Other nonspecific abnormal finding of lung field: Secondary | ICD-10-CM

## 2020-07-04 DIAGNOSIS — D492 Neoplasm of unspecified behavior of bone, soft tissue, and skin: Secondary | ICD-10-CM | POA: Diagnosis not present

## 2020-07-04 DIAGNOSIS — D219 Benign neoplasm of connective and other soft tissue, unspecified: Secondary | ICD-10-CM | POA: Insufficient documentation

## 2020-07-04 LAB — CMP (CANCER CENTER ONLY)
ALT: 23 U/L (ref 0–44)
AST: 30 U/L (ref 15–41)
Albumin: 3.7 g/dL (ref 3.5–5.0)
Alkaline Phosphatase: 101 U/L (ref 38–126)
Anion gap: 12 (ref 5–15)
BUN: 27 mg/dL — ABNORMAL HIGH (ref 8–23)
CO2: 23 mmol/L (ref 22–32)
Calcium: 9 mg/dL (ref 8.9–10.3)
Chloride: 105 mmol/L (ref 98–111)
Creatinine: 1.28 mg/dL — ABNORMAL HIGH (ref 0.61–1.24)
GFR, Estimated: >60 mL/min (ref >60)
Glucose, Bld: 92 mg/dL (ref 70–99)
Potassium: 4.2 mmol/L (ref 3.5–5.1)
Sodium: 140 mmol/L (ref 135–145)
Total Bilirubin: 0.5 mg/dL (ref 0.3–1.2)
Total Protein: 6.9 g/dL (ref 6.5–8.1)

## 2020-07-04 LAB — CBC WITH DIFFERENTIAL (CANCER CENTER ONLY)
Abs Immature Granulocytes: 0.03 10*3/uL (ref 0.00–0.07)
Basophils Absolute: 0 10*3/uL (ref 0.0–0.1)
Basophils Relative: 0 %
Eosinophils Absolute: 0.1 10*3/uL (ref 0.0–0.5)
Eosinophils Relative: 1 %
HCT: 43.4 % (ref 39.0–52.0)
Hemoglobin: 14 g/dL (ref 13.0–17.0)
Immature Granulocytes: 0 %
Lymphocytes Relative: 16 %
Lymphs Abs: 1.3 10*3/uL (ref 0.7–4.0)
MCH: 30 pg (ref 26.0–34.0)
MCHC: 32.3 g/dL (ref 30.0–36.0)
MCV: 92.9 fL (ref 80.0–100.0)
Monocytes Absolute: 0.7 10*3/uL (ref 0.1–1.0)
Monocytes Relative: 8 %
Neutro Abs: 6.1 10*3/uL (ref 1.7–7.7)
Neutrophils Relative %: 75 %
Platelet Count: 157 10*3/uL (ref 150–400)
RBC: 4.67 MIL/uL (ref 4.22–5.81)
RDW: 13.3 % (ref 11.5–15.5)
WBC Count: 8.2 10*3/uL (ref 4.0–10.5)
nRBC: 0 % (ref 0.0–0.2)

## 2020-07-04 NOTE — Telephone Encounter (Signed)
Faxed referral and pathology  To [facility]/t Dr Caleen Jobs office

## 2020-07-04 NOTE — Progress Notes (Signed)
[facility] [phone]   [phone]  CONSULT NOTE  REFERRING PHYSICIAN: Dr. Modesto Charon  REASON FOR CONSULTATION:  60 years old white male with solitary fibrous tumor  HPI Mathew [name] is a 60 y.o. male with past medical history significant for osteoarthritis, GERD, history of asthma, vitamin D deficiency as well as recent diagnosis of hypertension.  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  In May 2017 the patient started complaining of shortness of breath and he was noted during a physical exam by his primary care physician to have clubbing of his nails.  She ordered chest x-ray which showed abnormality in the right lung with suspicious mass in the right lower lobe.  This was followed by CT scan of the chest on Nov 06, 2015 and that showed a large pleural-based mass posteriorly in the mid to superior aspect of the right lower lobe.  The mass shows heterogeneous attenuation and faint heterogeneous enhancement with the focus of either more prominent vascular enhancement or calcification along its inferior medial border.  The mass is inseparable from the pleura but there was no evidence of underlying rib destruction.  There is also a small right pleural effusion inferiorly.  The mass extends from the lateral pleural surface across the right lower lobe and into the azygos esophageal recess where it is inseparable from the mediastinum.  A PET scan on December 06, 2015 showed 12 cm right lower lobe mass with central necrosis and low-grade metabolic activity consistent with low-grade bronchogenic carcinoma.  There was no evidence of thoracic nodal or distant metastatic disease.  On January 23, 2016 the patient underwent a right thoracotomy with resection of the chest wall mass with en bloc wedge resection of the right lower lobe under the care of Dr. Roxan Hockey.  The final pathology ([id number]) showed solitary fibrous tumor. The mass consists of a  bland spindle cell proliferation with associated dense collagen. There is infarct-type necrosis. There is no significant mitotic activity or pleomorphism. Immunohistochemistry reveals the cells are positive for CD34 (strong diffuse) and CD99 (weak). They are negative for S100, EMA, smooth muscle actin, and desmin. These findings are consistent with a solitary fibrous tumor.  The patient was followed by observation and repeat imaging studies under the care of Dr. Roxan Hockey.  Repeat CT scan of the chest on April 04, 2020 showed interval development of large right sided pleural effusion with areas of soft tissue density strongly suggestive of large areas of recurrent tumor in the right chest.  A PET scan on April 23, 2020 showed rounded pleural-based mass within the right lower lobe with fairly intense metabolic activity.  The differential includes benign and malignant pleural tumors.  There was large partially loculated right pleural effusion with passive atelectasis of the right lower lobe and no metastatic adenopathy in the mediastinum. On April 27, 2020 the patient underwent robotic assisted thoracotomy for drainage of large pleural effusion as well as resection of multiple pleural masses and wedge resection of the lung under the care of Dr. Roxan Hockey. 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 Immunohistochemical stains show that the tumor cells are diffusely positive for CD34 with focal staining for CK 8/18. Immunostains for calretinin, D2-40 and CK AE1/AE3 are negative. Ki-67 shows increased proliferative index. The patient was referred to me today for evaluation and recommendation regarding his condition. When  seen today the patient is feeling fine with no concerning complaints except for sensitivity in the right upper quadrant skin and muscle.   He denied having any significant shortness of breath but has mild cough with no hemoptysis.  He denied having any nausea, vomiting, diarrhea or constipation.  He has no headache or visual changes. Family history significant for mother with diabetes mellitus, father had hypertension and throat cancer. The patient is married and has 3 children.  He was accompanied today by his wife Coralyn Mark.  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  He denied having any history of smoking but drinks alcohol on the weekend and no history of drug abuse. HPI  Past Medical History:  Diagnosis Date  . Arthritis    osteo  . Asthma    asthma as a child  . Clubbing of nails   . Deafness in left ear   . GERD (gastroesophageal reflux disease)   . History of kidney stones   . Pneumonia   . Shortness of breath dyspnea    just initially  . Vitamin D deficiency   . White coat syndrome with high blood pressure without hypertension    4 years ago    Past Surgical History:  Procedure Laterality Date  . INTERCOSTAL NERVE BLOCK Right 04/27/2020   Procedure: INTERCOSTAL NERVE BLOCK;  Surgeon: [name], MD;  Location: [facility];  Service: Thoracic;  Laterality: Right;  . KNEE CARTILAGE SURGERY Left 1976  . pleural tumor Right 01/22/2016   resection of solitary fibrous tumor  . RESECTION OF MEDIASTINAL MASS Right 01/23/2016   this is in error- no mediastinal tumor  . THORACOTOMY Right 04/27/2020   Procedure: THORACOTOMY;  Surgeon: [name], MD;  Location: [facility];  Service: Thoracic;  Laterality: Right;  . TONSILLECTOMY    . VIDEO BRONCHOSCOPY WITH ENDOBRONCHIAL ULTRASOUND N/A 12/17/2015   Procedure: VIDEO BRONCHOSCOPY WITH ENDOBRONCHIAL ULTRASOUND;  Surgeon: [name], MD;  Location: [facility];  Service: Thoracic;  Laterality: N/A;    Family History  Problem Relation Age of Onset  . Diabetes type II Mother   . Hypertension Father     Social History Social History   Tobacco  Use  . Smoking status: Never Smoker  . Smokeless tobacco: Former Systems developer    Types: Secondary school teacher  . Vaping Use: Never used  Substance Use Topics  . Alcohol use: Yes    Alcohol/week: 0.0 standard drinks    Comment: 3- 5 drinks q month  . Drug use: Yes    Types: Marijuana    Comment: pot in middle 1982    Allergies  Allergen Reactions  . Aspirin Shortness Of Breath  . Penicillins Rash    Has patient had a PCN reaction causing immediate rash, facial/tongue/throat swelling, SOB or lightheadedness with hypotension:YES Has patient had a PCN reaction causing severe rash involving mucus membranes or skin necrosis: NO Has patient had a PCN reaction that required hospitalization NO Has patient had a PCN reaction occurring within the last 10 years: NO If all of the above answers are "NO", then may proceed with Cephalosporin use.    Current Outpatient Medications  Medication Sig Dispense Refill  . Acetylcysteine (NAC 600) 600 MG CAPS Take 600 mg by mouth daily.    . Ascorbic Acid (VITAMIN C) 1000 MG tablet Take 1,000 mg by mouth daily. Raw    . Cholecalciferol (VITAMIN D3) 50 MCG (2000 UT) capsule Take 1 capsule (  2,000 Units total) by mouth daily. 1 capsule 0  . Misc Natural Products (ELDERBERRY ZINC/VIT C/IMMUNE) LOZG Take 2 tablets by mouth in the morning and at bedtime. Gummie    . Multiple Vitamins-Minerals (ZINC PO) Take 1.5 mg by mouth daily.     . Quercetin 250 MG TABS Take 500 mg by mouth daily. 50 mg Bromelain 50 mg calcium     No current facility-administered medications for this visit.    Review of Systems  Constitutional: negative Eyes: negative Ears, nose, mouth, throat, and face: negative Respiratory: positive for cough Cardiovascular: negative Gastrointestinal: negative Genitourinary:negative Integument/breast: negative Hematologic/lymphatic: negative Musculoskeletal:negative Neurological: negative Behavioral/Psych: negative Endocrine:  negative Allergic/Immunologic: negative  Physical Exam  ZOX:WRUEA, healthy, no distress, well nourished and well developed SKIN: skin color, texture, turgor are normal, no rashes or significant lesions HEAD: Normocephalic, No masses, lesions, tenderness or abnormalities EYES: normal, PERRLA, Conjunctiva are pink and non-injected EARS: External ears normal, Canals clear OROPHARYNX:no exudate, no erythema and lips, buccal mucosa, and tongue normal  NECK: supple, no adenopathy, no JVD LYMPH:  no palpable lymphadenopathy, no hepatosplenomegaly LUNGS: clear to auscultation , and palpation HEART: regular rate & rhythm and no murmurs ABDOMEN:abdomen soft, non-tender, normal bowel sounds and no masses or organomegaly BACK: No CVA tenderness, Range of motion is normal EXTREMITIES:no joint deformities, effusion, or inflammation, no edema  NEURO: alert & oriented x 3 with fluent speech, no focal motor/sensory deficits  PERFORMANCE STATUS: ECOG 1  LABORATORY DATA: Lab Results  Component Value Date   WBC 8.2 07/04/2020   HGB 14.0 07/04/2020   HCT 43.4 07/04/2020   MCV 92.9 07/04/2020   PLT 157 07/04/2020      Chemistry      Component Value Date/Time   NA 135 04/29/2020 0412   K 4.1 04/29/2020 0412   CL 100 04/29/2020 0412   CO2 30 04/29/2020 0412   BUN 13 04/29/2020 0412   CREATININE 1.10 04/29/2020 0412      Component Value Date/Time   CALCIUM 8.3 (L) 04/29/2020 0412   ALKPHOS 57 04/29/2020 0412   AST 84 (H) 04/29/2020 0412   ALT 31 04/29/2020 0412   BILITOT 0.6 04/29/2020 0412       RADIOGRAPHIC STUDIES: No results found.  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 Roxan Hockey.   PLAN: I had a lengthy discussion with the patient and his wife today  about his current disease stage, prognosis and treatment options.  I explained to the patient that the best option for management of his condition is the surgical resection which he already had twice initially in 2017 and most recently in 2021.  I explained to the patient that this is a rare condition and I am not sure if there is enough data about adjuvant systemic therapy.  He could benefit from palliative radiation to the right lower lobe area with the hope to prevent future recurrence but I would consider referring the patient to Dr. Angelina Ok at [facility] who is an expert in the sarcoma field for evaluation and recommendation regarding his condition. Is Dr. Angelina Ok recommended any adjuvant treatment for this patient, we will be happy to do it for him in [place] unless he enroll him in any clinical trial. I will leave his appointment  open for now until after evaluation by Dr. Angelina Ok. The patient was advised to call immediately if he has any other concerning symptoms in the interval.  The patient voices understanding of current disease status and treatment options and is in agreement with the current care plan.  All questions were answered. The patient knows to call the clinic with any problems, questions or concerns. We can certainly see the patient much sooner if necessary.  Thank you so much for allowing me to participate in the care of Mathew [name]. I will continue to follow up the patient with you and assist in his care. The total time spent in the appointment was 70 minutes.  Disclaimer: This note was dictated with voice recognition software. Similar sounding words can inadvertently be transcribed and may not be corrected upon review.   Eilleen Kempf July 04, 2020, 2:00 PM

## 2020-07-05 ENCOUNTER — Telehealth: Payer: Self-pay | Admitting: Medical Oncology

## 2020-07-05 NOTE — Telephone Encounter (Signed)
[facility] is out of network for pt.  [facility]. And referred pt.

## 2020-07-05 NOTE — Telephone Encounter (Signed)
Faxed op note, and discharge summary.

## 2020-07-30 ENCOUNTER — Other Ambulatory Visit: Payer: Self-pay | Admitting: Thoracic Surgery (Cardiothoracic Vascular Surgery)

## 2020-07-30 DIAGNOSIS — R918 Other nonspecific abnormal finding of lung field: Secondary | ICD-10-CM

## 2020-07-31 ENCOUNTER — Ambulatory Visit: Payer: BLUE CROSS/BLUE SHIELD | Admitting: Thoracic Surgery (Cardiothoracic Vascular Surgery)

## 2020-07-31 ENCOUNTER — Encounter: Payer: Self-pay | Admitting: Thoracic Surgery (Cardiothoracic Vascular Surgery)

## 2020-07-31 ENCOUNTER — Other Ambulatory Visit: Payer: Self-pay

## 2020-07-31 ENCOUNTER — Other Ambulatory Visit: Payer: Self-pay | Admitting: *Deleted

## 2020-07-31 ENCOUNTER — Ambulatory Visit
Admission: RE | Admit: 2020-07-31 | Discharge: 2020-07-31 | Disposition: A | Discharge status: Home / Self Care | Payer: 59 | Source: Ambulatory Visit | Attending: Thoracic Surgery (Cardiothoracic Vascular Surgery) | Admitting: Thoracic Surgery (Cardiothoracic Vascular Surgery)

## 2020-07-31 VITALS — BP 185/100 | HR 67 | Resp 20 | Ht 69.0 in | Wt 180.0 lb

## 2020-07-31 DIAGNOSIS — D492 Neoplasm of unspecified behavior of bone, soft tissue, and skin: Secondary | ICD-10-CM

## 2020-07-31 DIAGNOSIS — Z9889 Other specified postprocedural states: Secondary | ICD-10-CM

## 2020-07-31 DIAGNOSIS — R918 Other nonspecific abnormal finding of lung field: Secondary | ICD-10-CM

## 2020-07-31 NOTE — Progress Notes (Signed)
Mathew [name]       [name],Mathew [id number]             [phone]     HPI: Mathew [name] returns for scheduled follow-up  Mathew [name] is a 60 year old with a history of a solitary fibrous tumor of the pleura, asthma, arthritis, and reflux.  He presented initially in 2017 with shortness of breath with heavy exertion.  He was found to have a 12 cm mass in the chest.  That was a solitary fibrous tumor of the pleura.  Pathology showed no significant mitotic activity or pleomorphism.  Last fall he had a new large right pleural effusion and areas of soft tissue density.  I reoperated on him and found multiple pleural masses, one arising from the visceral pleura and the remainder from the parietal pleura.  Findings on pathology were consistent with malignant fibrous tumors.  He did well postoperatively.  He saw Dr. Julien Nordmann.  He was referred for second opinion to [facility].  He saw Dr. Kendall Flack there.  He did not recommend any systemic therapy at the present time but recommended CT imaging every 3 months for 2 years.  The plan was there was any evidence of recurrence he would start with systemic therapy with sunitinib.  Past Medical History:  Diagnosis Date  . Arthritis    osteo  . Asthma    asthma as a child  . Clubbing of nails   . Deafness in left ear   . GERD (gastroesophageal reflux disease)   . History of kidney stones   . Pneumonia   . Shortness of breath dyspnea    just initially  . Vitamin D deficiency   . White coat syndrome with high blood pressure without hypertension    4 years ago    Current Outpatient Medications  Medication Sig Dispense Refill  . Acetylcysteine (NAC 600) 600 MG CAPS Take 600 mg by mouth daily.    . Ascorbic Acid (VITAMIN C) 1000 MG tablet Take 1,000 mg by mouth daily. Raw    . Cholecalciferol (VITAMIN D3) 50 MCG (2000 UT) capsule Take 1 capsule (2,000 Units total) by mouth daily. 1 capsule 0  . Misc Natural Products (ELDERBERRY ZINC/VIT  C/IMMUNE) LOZG Take 2 tablets by mouth in the morning and at bedtime. Gummie    . Multiple Vitamins-Minerals (ZINC PO) Take 1.5 mg by mouth daily.    . Quercetin 250 MG TABS Take 500 mg by mouth daily. 50 mg Bromelain 50 mg calcium     No current facility-administered medications for this visit.    Physical Exam BP (!) 185/100   Pulse 67   Resp 20   Ht 5\' 9"  (1.753 m)   Wt 180 lb (81.6 kg)   SpO2 99% Comment: RA  BMI 26.58 kg/m  Well-appearing 60 year old man in no acute distress Alert and oriented x3 with no focal deficits Lungs clear with equal breath sounds bilaterally Incisions well-healed Cardiac regular rate and rhythm  Diagnostic Tests: CHEST - 2 VIEW  COMPARISON:  05/29/2020.  04/30/2020.  PET-CT 04/23/2020  FINDINGS: Mediastinum and hilar structures normal. Stable cardiomegaly. Postsurgical changes of the right lung again noted. Minimal increased density noted over the right mid lung in the region of previously identified scarring. This may be secondary to superimposition of rib. Follow-up PA and lateral chest x-ray suggested to exclude developing infiltrate or mass. Stable right base pleural thickening consistent scarring. Degenerative change thoracic spine.  IMPRESSION: Minimal increased  density noted over the right mid lung in the region of previously identified scarring. This may be secondary to superimposition of rib. Follow-up PA lateral chest x-ray suggested to exclude developing infiltrate or mass. Exam otherwise stable.   Electronically Signed   By: Marcello Moores  Register   On: 07/31/2020 09:54 I personally reviewed the chest x-ray images.  I suspect this is just overlapping shadows.  Impression: Mathew [name] is a 60 year old man with a history of a solitary fibrous tumor of the pleura, asthma, arthritis, and reflux.  He had a solitary fibrous tumor of the pleura resected in 2017.  The tumor was 12 cm.  It had a bland appearance.  He presented  back with a pleural effusion and multiple pleural masses.  I resected those and they turned out to be malignant fibrous tumors of the pleura.  From a surgical standpoint is doing well.  He is not having any incisional pain or shortness of breath.  Chest x-ray today shows some density in the right midlung region.  That likely is just overlapping shadows.  He is 3 months out from surgery so he is due for a CT of the chest anyway.  We will go ahead and do that and have him come back to see me in 2 weeks  Plan: Return in 2 weeks with CT chest  [name], MD [facility] [phone]

## 2020-08-10 ENCOUNTER — Other Ambulatory Visit: Payer: Self-pay

## 2020-08-10 ENCOUNTER — Ambulatory Visit (HOSPITAL_COMMUNITY)
Admission: RE | Admit: 2020-08-10 | Discharge: 2020-08-10 | Disposition: A | Discharge status: Home / Self Care | Payer: 59 | Source: Ambulatory Visit | Attending: Thoracic Surgery (Cardiothoracic Vascular Surgery) | Admitting: Thoracic Surgery (Cardiothoracic Vascular Surgery)

## 2020-08-10 DIAGNOSIS — D492 Neoplasm of unspecified behavior of bone, soft tissue, and skin: Secondary | ICD-10-CM | POA: Insufficient documentation

## 2020-08-10 DIAGNOSIS — Z9889 Other specified postprocedural states: Secondary | ICD-10-CM | POA: Diagnosis present

## 2020-08-14 ENCOUNTER — Ambulatory Visit: Payer: 59 | Admitting: Thoracic Surgery (Cardiothoracic Vascular Surgery)

## 2020-08-14 ENCOUNTER — Other Ambulatory Visit: Payer: Self-pay

## 2020-08-14 ENCOUNTER — Encounter: Payer: Self-pay | Admitting: Thoracic Surgery (Cardiothoracic Vascular Surgery)

## 2020-08-14 VITALS — BP 214/113 | HR 69 | Temp 97.7°F | Resp 20 | Ht 69.0 in | Wt 179.0 lb

## 2020-08-14 DIAGNOSIS — Z9889 Other specified postprocedural states: Secondary | ICD-10-CM | POA: Diagnosis not present

## 2020-08-14 DIAGNOSIS — D492 Neoplasm of unspecified behavior of bone, soft tissue, and skin: Secondary | ICD-10-CM | POA: Diagnosis not present

## 2020-08-14 NOTE — Progress Notes (Signed)
Mathew [name]       Mathew [name]             [phone]    HPI: [name] returns for a scheduled follow-up visit  Mathew [name] is a 60 year old man with a history of recurrent fibrous tumors of the pleura, asthma, arthritis, and reflux.  He had resection of a 12 cm solitary fibrous tumor of the pleura and 2017.  In the fall 2021 he had a new pleural effusion and multiple pleural masses.  I did a redo right thoracotomy and resected multiple pleural masses did a wedge resection of the lung.  These turned out to be multiple fibrous tumors of the pleura with some appearing malignant.  He saw Dr. Julien [name] as well as Dr. Kendall [name] at [facility] is for observation for now.  At his last office visit his chest x-ray showed an opacity in the right chest.  This was felt to most likely be due to overlapping shadows, but I did a CT of the chest to get a baseline to make sure there were not any new concerning findings.  He now returns to discuss his results.  He feels well.  He is anxious about his CT results.  He denies any pain.  Past Medical History:  Diagnosis Date  . Arthritis    osteo  . Asthma    asthma as a child  . Clubbing of nails   . Deafness in left ear   . GERD (gastroesophageal reflux disease)   . History of kidney stones   . Pneumonia   . Shortness of breath dyspnea    just initially  . Vitamin D deficiency   . White coat syndrome with high blood pressure without hypertension    4 years ago    Current Outpatient Medications  Medication Sig Dispense Refill  . Acetylcysteine (NAC 600) 600 MG CAPS Take 600 mg by mouth daily.    . Ascorbic Acid (VITAMIN C) 1000 MG tablet Take 1,000 mg by mouth daily. Raw    . Cholecalciferol (VITAMIN D3) 50 MCG (2000 UT) capsule Take 1 capsule (2,000 Units total) by mouth daily. 1 capsule 0  . Misc Natural Products (ELDERBERRY ZINC/VIT C/IMMUNE) LOZG Take 2 tablets by mouth in the morning and at bedtime.  Gummie    . Multiple Vitamins-Minerals (ZINC PO) Take 1.5 mg by mouth daily.    . Quercetin 250 MG TABS Take 500 mg by mouth daily. 50 mg Bromelain 50 mg calcium     No current facility-administered medications for this visit.    Physical Exam BP (!) 214/113 (BP Location: Left Arm, Patient Position: Sitting, Cuff Size: Normal)   Pulse 69   Temp 97.7 F (36.5 C) (Skin)   Resp 20   Ht 5\' 9"  (1.753 m)   Wt 179 lb (81.2 kg)   SpO2 98% Comment: RA  BMI 26.43 kg/m  Repeat blood pressure 3/62 60 year old male in no acute distress Alert and oriented x3 with no focal deficits Cardiac regular rate and rhythm Lungs clear with equal breath sounds bilaterally Incision well-healed  Diagnostic Tests: CT CHEST WITHOUT CONTRAST  TECHNIQUE: Multidetector CT imaging of the chest was performed following the standard protocol without IV contrast.  COMPARISON:  04/23/2020 PET-CT. 04/04/2020 chest CT.  FINDINGS: Cardiovascular: Normal heart size. No significant pericardial effusion/thickening. Left anterior descending coronary atherosclerosis. Great vessels are normal in course and caliber.  Mediastinum/Nodes: No discrete thyroid nodules. Unremarkable  esophagus. No pathologically enlarged axillary, mediastinal or hilar lymph nodes, noting limited sensitivity for the detection of hilar adenopathy on this noncontrast study.  Lungs/Pleura: No pneumothorax. Resolved right pleural effusion. No left pleural effusion. Interval resection of posterior inferior right pleural mass. No residual or recurrent pleural masses. Suture lines throughout the posterior right upper lobe and posterior right upper lobe with associated extensive mildly thickened curvilinear parenchymal bands compatible with postsurgical scarring. No acute consolidative airspace disease or lung masses. A few scattered small solid pulmonary nodules in both lungs, largest 4 mm in the left lower lobe (series 7/image 83),  all stable. No new significant pulmonary nodules.  Upper abdomen: Hypodense 3.7 cm posterior left liver mass (series 2/image 136), stable since 12/06/2015 PET-CT, considered benign. Simple 1.6 cm medial upper right renal cyst.  Musculoskeletal: No aggressive appearing focal osseous lesions. Healed posterior right seventh and eighth rib deformities. Mild thoracic spondylosis.  IMPRESSION: 1. Interval resection of right pleural mass. No residual or recurrent pleural masses. Resolved right pleural effusion. 2. Expected postsurgical changes in the posterior right lung. 3. No findings of metastatic disease in the chest. 4. One vessel coronary atherosclerosis.   Electronically Signed   By: Mathew [name] M.D.   On: 08/11/2020 14:35  I personally reviewed the CT images and concur with the findings noted above.  Postoperative changes.  No suspicious findings.  Impression: Mathew [name] is a 60 year old man with a history of recurrent fibrous tumors of the pleura, asthma, arthritis, and reflux.   Multiple pleural fibrous tumors-varying morphology from relatively benign to very aggressive appearing.  Will be followed by Dr. Julien [name].  Also has seen Dr. Kendall [name] at [facility].  Current CT shows no concerning findings.  Hypertension-blood pressure severely elevated.  He is asymptomatic.  He says he checked himself as recently is 3 days ago and his blood pressure was 136/84.  He was very anxious about the results of the CT.  I recommended that he recheck his blood pressure when he gets home.  If still elevated he should contact his primary care physician Dr. Karlton [name] or go to the emergency room.  Plan: I will plan to see him back in about 6 months after CT with Dr. Julien [name].  Mathew Nakayama, MD [facility] [phone]

## 2021-02-19 ENCOUNTER — Other Ambulatory Visit: Payer: Self-pay

## 2021-02-19 ENCOUNTER — Telehealth: Payer: Self-pay

## 2021-02-19 ENCOUNTER — Ambulatory Visit: Payer: 59 | Admitting: Thoracic Surgery (Cardiothoracic Vascular Surgery)

## 2021-02-19 DIAGNOSIS — D492 Neoplasm of unspecified behavior of bone, soft tissue, and skin: Secondary | ICD-10-CM

## 2021-02-19 DIAGNOSIS — R918 Other nonspecific abnormal finding of lung field: Secondary | ICD-10-CM

## 2021-02-19 NOTE — Telephone Encounter (Signed)
Pt LM stating he needs an order for Chest CT.  Discussed this with Dr. Julien Nordmann who gave verbal for CT Chest order. I have called the pt back and LM to advise of this and left the number for Radiology scheduling as well.

## 2021-02-20 ENCOUNTER — Other Ambulatory Visit: Payer: Self-pay | Admitting: Medical Oncology

## 2021-02-20 ENCOUNTER — Telehealth: Payer: Self-pay | Admitting: Medical Oncology

## 2021-02-20 DIAGNOSIS — C3490 Malignant neoplasm of unspecified part of unspecified bronchus or lung: Secondary | ICD-10-CM

## 2021-02-20 NOTE — Telephone Encounter (Signed)
Ct scans have to use in network imaging sites [facility] is in network.

## 2021-02-21 ENCOUNTER — Telehealth: Payer: Self-pay | Admitting: Medical Oncology

## 2021-02-21 NOTE — Telephone Encounter (Signed)
Imaging authorization [id number] 9/7-12/7-called to [place] at [facility]. Schedule message sent for lab appt.

## 2021-02-25 ENCOUNTER — Inpatient Hospital Stay: Payer: 59 | Attending: Internal Medicine

## 2021-03-14 ENCOUNTER — Telehealth: Payer: Self-pay | Admitting: Medical Oncology

## 2021-03-14 ENCOUNTER — Other Ambulatory Visit: Payer: Self-pay | Admitting: Medical Oncology

## 2021-03-14 DIAGNOSIS — C3490 Malignant neoplasm of unspecified part of unspecified bronchus or lung: Secondary | ICD-10-CM

## 2021-03-14 NOTE — Telephone Encounter (Signed)
Pt does not want kidney function test for CT scan due to added expense .  All his other CT scans  have been without contrast. Order changed to without contrast and still to be done at med center [facility]. Schedule message sent and pt given phone number to Central scheduling.

## 2021-03-18 ENCOUNTER — Telehealth: Payer: Self-pay | Admitting: Medical Oncology

## 2021-03-18 NOTE — Telephone Encounter (Signed)
LVM to confirm CT appt without contrast at [facility] this week.

## 2021-03-19 ENCOUNTER — Other Ambulatory Visit: Payer: Self-pay

## 2021-03-19 ENCOUNTER — Ambulatory Visit (INDEPENDENT_AMBULATORY_CARE_PROVIDER_SITE_OTHER): Payer: 59

## 2021-03-19 DIAGNOSIS — D491 Neoplasm of unspecified behavior of respiratory system: Secondary | ICD-10-CM | POA: Diagnosis not present

## 2021-03-19 DIAGNOSIS — J984 Other disorders of lung: Secondary | ICD-10-CM

## 2021-03-19 DIAGNOSIS — C3490 Malignant neoplasm of unspecified part of unspecified bronchus or lung: Secondary | ICD-10-CM

## 2021-03-19 DIAGNOSIS — I700 Atherosclerosis of aorta: Secondary | ICD-10-CM | POA: Diagnosis not present

## 2021-03-29 ENCOUNTER — Inpatient Hospital Stay: Payer: 59 | Attending: Internal Medicine | Admitting: Internal Medicine

## 2021-03-29 ENCOUNTER — Telehealth: Payer: Self-pay

## 2021-03-29 ENCOUNTER — Other Ambulatory Visit (HOSPITAL_COMMUNITY): Payer: Self-pay

## 2021-03-29 ENCOUNTER — Telehealth: Payer: Self-pay | Admitting: Pharmacist

## 2021-03-29 ENCOUNTER — Other Ambulatory Visit: Payer: Self-pay

## 2021-03-29 VITALS — BP 182/112 | HR 64 | Temp 97.0°F | Resp 18 | Ht 69.0 in | Wt 175.2 lb

## 2021-03-29 DIAGNOSIS — R918 Other nonspecific abnormal finding of lung field: Secondary | ICD-10-CM | POA: Diagnosis not present

## 2021-03-29 DIAGNOSIS — D492 Neoplasm of unspecified behavior of bone, soft tissue, and skin: Secondary | ICD-10-CM

## 2021-03-29 DIAGNOSIS — Z87442 Personal history of urinary calculi: Secondary | ICD-10-CM | POA: Diagnosis not present

## 2021-03-29 DIAGNOSIS — J45909 Unspecified asthma, uncomplicated: Secondary | ICD-10-CM | POA: Diagnosis not present

## 2021-03-29 DIAGNOSIS — Z5111 Encounter for antineoplastic chemotherapy: Secondary | ICD-10-CM | POA: Insufficient documentation

## 2021-03-29 DIAGNOSIS — M129 Arthropathy, unspecified: Secondary | ICD-10-CM | POA: Insufficient documentation

## 2021-03-29 DIAGNOSIS — C3411 Malignant neoplasm of upper lobe, right bronchus or lung: Secondary | ICD-10-CM | POA: Insufficient documentation

## 2021-03-29 DIAGNOSIS — I10 Essential (primary) hypertension: Secondary | ICD-10-CM | POA: Diagnosis not present

## 2021-03-29 DIAGNOSIS — K219 Gastro-esophageal reflux disease without esophagitis: Secondary | ICD-10-CM | POA: Diagnosis not present

## 2021-03-29 DIAGNOSIS — E559 Vitamin D deficiency, unspecified: Secondary | ICD-10-CM | POA: Diagnosis not present

## 2021-03-29 MED ORDER — SUNITINIB MALATE 37.5 MG PO CAPS
37.5000 mg | ORAL_CAPSULE | Freq: Every day | ORAL | 3 refills | Status: DC
  Filled 2021-03-29: qty 30, 30d supply, fill #0
  Filled 2021-04-01: qty 28, 28d supply, fill #0
  Filled 2021-05-01: qty 28, 28d supply, fill #1
  Filled 2021-05-28: qty 28, 28d supply, fill #2
  Filled 2021-06-27: qty 28, 28d supply, fill #3

## 2021-03-29 NOTE — Progress Notes (Signed)
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 Roxan Hockey.  CURRENT THERAPY: Sunitinib 3 7.5 mg p.o. daily.  First dose expected in the next few days.  INTERVAL HISTORY: Mathew [name] 60 y.o. male returns to the clinic today for follow-up visit accompanied by his wife.  The patient is feeling fine today with no concerning complaints.  He continues to be very active and exercise at regular basis.  He denied having any current chest pain, shortness of breath, cough or hemoptysis.  He denied having any fever or chills.  He has no nausea, vomiting, diarrhea or constipation.  He has no headache or visual changes.  He denied having any recent weight loss or night sweats.  He was seen by Dr. Kendall Flack at [facility] for second opinion and he recommended for him observation and consideration of treatment with sunitinib if he has any evidence for disease recurrence followed by evaluation of surgical resection.  The patient had repeat CT scan of the chest performed recently and he is here for evaluation and discussion of his discuss results and treatment options.  MEDICAL HISTORY: Past Medical History:  Diagnosis Date   Arthritis    osteo   Asthma    asthma as a child   Clubbing of nails    Deafness in left ear    GERD (gastroesophageal reflux disease)    History of kidney stones    Pneumonia    Shortness of breath dyspnea    just initially   Vitamin D deficiency    White coat syndrome with high blood pressure without hypertension    4 years ago    ALLERGIES:  is allergic to aspirin and penicillins.  MEDICATIONS:  Current  Outpatient Medications  Medication Sig Dispense Refill   SUNItinib (SUTENT) 37.5 MG capsule Take 1 capsule (37.5 mg total) by mouth daily. 30 capsule 3   Acetylcysteine (NAC 600) 600 MG CAPS Take 600 mg by mouth daily.     Ascorbic Acid (VITAMIN C) 1000 MG tablet Take 1,000 mg by mouth daily. Raw     Cholecalciferol (VITAMIN D3) 50 MCG (2000 UT) capsule Take 1 capsule (2,000 Units total) by mouth daily. 1 capsule 0   Misc Natural Products (ELDERBERRY ZINC/VIT C/IMMUNE) LOZG Take 2 tablets by mouth in the morning and at bedtime. Gummie     Multiple Vitamins-Minerals (ZINC PO) Take 1.5 mg by mouth daily.     Quercetin 250 MG TABS Take 500 mg by mouth daily. 50 mg Bromelain 50 mg calcium     No current facility-administered medications for this visit.    SURGICAL HISTORY:  Past Surgical History:  Procedure Laterality Date   INTERCOSTAL NERVE BLOCK Right 04/27/2020   Procedure: INTERCOSTAL NERVE BLOCK;  Surgeon: [name], MD;  Location: [facility];  Service: Thoracic;  Laterality: Right;   KNEE CARTILAGE SURGERY Left 1976   pleural tumor Right 01/22/2016   resection of solitary fibrous tumor   RESECTION OF MEDIASTINAL MASS Right 01/23/2016   this is in error- no mediastinal tumor   THORACOTOMY Right 04/27/2020   Procedure: THORACOTOMY;  Surgeon: [name], MD;  Location:  [facility] OR;  Service: Thoracic;  Laterality: Right;   TONSILLECTOMY     VIDEO BRONCHOSCOPY WITH ENDOBRONCHIAL ULTRASOUND N/A 12/17/2015   Procedure: VIDEO BRONCHOSCOPY WITH ENDOBRONCHIAL ULTRASOUND;  Surgeon: [name], MD;  Location: [facility];  Service: Thoracic;  Laterality: N/A;    REVIEW OF SYSTEMS:  Constitutional: negative Eyes: negative Ears, nose, mouth, throat, and face: negative Respiratory: negative Cardiovascular: negative Gastrointestinal: negative Genitourinary:negative Integument/breast: negative Hematologic/lymphatic: negative Musculoskeletal:negative Neurological:  negative Behavioral/Psych: negative Endocrine: negative Allergic/Immunologic: negative   PHYSICAL EXAMINATION: General appearance: alert, cooperative, and no distress Head: Normocephalic, without obvious abnormality, atraumatic Neck: no adenopathy, no JVD, supple, symmetrical, trachea midline, and thyroid not enlarged, symmetric, no tenderness/mass/nodules Lymph nodes: Cervical, supraclavicular, and axillary nodes normal. Resp: clear to auscultation bilaterally Back: symmetric, no curvature. ROM normal. No CVA tenderness. Cardio: regular rate and rhythm, S1, S2 normal, no murmur, click, rub or gallop GI: soft, non-tender; bowel sounds normal; no masses,  no organomegaly Extremities: extremities normal, atraumatic, no cyanosis or edema Neurologic: Alert and oriented X 3, normal strength and tone. Normal symmetric reflexes. Normal coordination and gait  ECOG PERFORMANCE STATUS: 1 - Symptomatic but completely ambulatory  Blood pressure (!) 182/112, pulse 64, temperature (!) 97 F (36.1 C), temperature source Tympanic, resp. rate 18, height 5\' 9"  (1.753 m), weight 175 lb 3.2 oz (79.5 kg), SpO2 100 %.  LABORATORY DATA: Lab Results  Component Value Date   WBC 8.2 07/04/2020   HGB 14.0 07/04/2020   HCT 43.4 07/04/2020   MCV 92.9 07/04/2020   PLT 157 07/04/2020      Chemistry      Component Value Date/Time   NA 140 07/04/2020 1339   K 4.2 07/04/2020 1339   CL 105 07/04/2020 1339   CO2 23 07/04/2020 1339   BUN 27 (H) 07/04/2020 1339   CREATININE 1.28 (H) 07/04/2020 1339      Component Value Date/Time   CALCIUM 9.0 07/04/2020 1339   ALKPHOS 101 07/04/2020 1339   AST 30 07/04/2020 1339   ALT 23 07/04/2020 1339   BILITOT 0.5 07/04/2020 1339       RADIOGRAPHIC STUDIES: CT Chest Wo Contrast  Result Date: 03/20/2021 CLINICAL DATA:  Non-small-cell lung cancer. Solitary fibrous tumor of the pleura. Restaging. EXAM: CT CHEST WITHOUT CONTRAST TECHNIQUE: Multidetector CT imaging of  the chest was performed following the standard protocol without IV contrast. COMPARISON:  08/10/2020 FINDINGS: Cardiovascular: The heart size is normal. No substantial pericardial effusion. Coronary artery calcification is evident. No discernible atherosclerotic calcification in the thoracic aorta. Mediastinum/Nodes: No mediastinal lymphadenopathy. No evidence for gross hilar lymphadenopathy although assessment is limited by the lack of intravenous contrast on the current study. The esophagus has normal imaging features. There is no axillary lymphadenopathy. Lungs/Pleura: Surgical changes with architectural distortion and scarring again noted posterior right lung, similar in the interval although there is a new 14 mm nodular component posteriorly on image 100/3 (compare to image 89/7 previously). No suspicious pulmonary nodule or mass in the left lung. 4 mm left lower lobe pulmonary nodule on 92/3 is similar to prior. 2 mm left upper lobe nodule on 74/3 is stable. No focal airspace consolidation.  No pleural effusion. Upper Abdomen: Unremarkable. Musculoskeletal: No worrisome lytic or sclerotic osseous abnormality. IMPRESSION: 1. Surgical changes in the posterior right lung with architectural distortion and scarring, similar in the interval although there is a new 14 mm nodular component posteriorly on today's exam. Close attention on follow-up recommended. 2. Stable tiny left lung nodules.  3. Aortic Atherosclerosis (ICD10-I70.0). Electronically Signed   By: Misty Stanley M.D.   On: 03/20/2021 07:29    ASSESSMENT AND PLAN: This is a very pleasant 60 years old male with recurrent solitary fibrous tumor of the right lung diagnosed in June 2017 s/p resection followed by recurrence in October 2021 status post reresection under the care of Dr. Roxan Hockey. The patient has been on observation since his resection.  There was no role for adjuvant therapy for his condition.  He was seen by Dr. Kendall Flack at [facility] who recommended for him observation and consideration of treatment with sunitinib followed by surgery if the patient has disease recurrence He had repeat CT scan of the chest performed recently.  The scan showed stable disease except for a new 1.4 cm nodular component posteriorly. I discussed the scan results and showed the images to the patient and his wife. I gave him the option of continuous observation and monitoring with imaging studies versus consideration of treatment with sunitinib 37.5 mg daily followed by imaging studies in 3 months. The patient is interested in proceeding with the treatment.  I discussed with him the adverse effect of this treatment and provided him with drug information at the after visit summary. He will be contacted by the pharmacist for oral oncolytic from the cancer center for further education and also to help The patient refilling his medication. I will see him back for follow-up visit in around 2 weeks for evaluation and repeat blood work for close monitoring of his condition and also management of any adverse effect of his treatment. The patient was advised to call immediately if he has any other concerning symptoms in the interval. The patient voices understanding of current disease status and treatment options and is in agreement with the current care plan.  All questions were answered. The patient knows to call the clinic with any problems, questions or concerns. We can certainly see the patient much sooner if necessary.  The total time spent in the appointment was 40 minutes.  Disclaimer: This note was dictated with voice recognition software. Similar sounding words can inadvertently be transcribed and may not be corrected upon review.

## 2021-03-29 NOTE — Telephone Encounter (Signed)
Oral Oncology Pharmacist Encounter  Received new prescription for Sutent (sunitinib) for the treatment of recurrent solitary fibrous tumor, planned duration until disease progression or unacceptable drug toxicity.  Unable to assess recent labs since last ones in patient's chart are from 07/04/20. There are no baseline dose adjustments that would need to be made based on renal or hepatic function. Recommend close monitoring of patient's renal function after therapy initiation due to risk of increase in serum creatinine with medication. Noted that patient's BP was 182/112 mmHg in clinic this AM - this will need to be closely monitored after initiation of sunitinib due to risk of added HTN from drug.   Current medication list in Epic reviewed, no relevant/significant DDIs with Sutent identified.  Evaluated chart and no patient barriers to medication adherence noted.   Patient agreement for treatment documented in MD note on 03/29/21.  Prescription has been e-scribed to the [facility] for benefits analysis and approval.  Oral Oncology Clinic will continue to follow for insurance authorization, copayment issues, initial counseling and start date.  Leron Croak, PharmD, BCPS Hematology/Oncology Clinical Pharmacist [facility] [place] [phone] 03/29/2021 12:24 PM

## 2021-03-29 NOTE — Progress Notes (Signed)
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

## 2021-03-29 NOTE — Telephone Encounter (Signed)
Oral Oncology Patient Advocate Encounter  Prior Authorization for Sunitinib has been approved.    PA# [id number] Effective dates: 03/29/21 through 03/29/22  Patients co-pay is $300 (Obtained [employer] copay card making copay $0)  Oral Oncology Clinic will continue to follow.   [place] Patient [place] Phone [phone] Fax [phone] 03/29/2021 1:56 PM

## 2021-03-29 NOTE — Telephone Encounter (Signed)
Oral Oncology Patient Advocate Encounter   Received notification from [facility] that prior authorization for Sutent is required.   PA submitted on CoverMyMeds Key [id number] Status is pending   Oral Oncology Clinic will continue to follow.   [place] Patient [place] Phone [phone] Fax [phone] 03/29/2021 11:53 AM

## 2021-04-01 ENCOUNTER — Other Ambulatory Visit (HOSPITAL_COMMUNITY): Payer: Self-pay

## 2021-04-01 NOTE — Telephone Encounter (Signed)
Oral Chemotherapy Pharmacist Encounter   Attempted to reach patient to provide update and offer for initial counseling on oral medication: Sutent (sunitinib).   No answer. Left voicemail for patient to call back to discuss details of medication acquisition and initial counseling session.  Leron Croak, PharmD, BCPS Hematology/Oncology Clinical Pharmacist [facility] [place] [phone] 04/01/2021 1:02 PM

## 2021-04-01 NOTE — Telephone Encounter (Signed)
Oral Chemotherapy Pharmacist Encounter  I spoke with patient for overview of: Sutent (sunitinib) for the treatment of recurrent solitary fibrous tumor, planned duration until disease progression or unacceptable drug toxicity.  Counseled patient on administration, dosing, side effects, monitoring, drug-food interactions, safe handling, storage, and disposal.  Patient will take Sutent 37.5mg  capsules, 1 capsule by mouth once daily with or without food.  Patient knows to avoid grapefruit and grapefruit juice while on Sutent.  Sutent start date: 04/08/21 (patient prefers to start after his [employer] competition this weekend)  Adverse effects include but are not limited to: fatigue, diarrhea, nausea, vomiting, mouth sores, rash, hand-foot syndrome, bleeding events, and altered cardiac function. Discussed in detail the importance of keeping a blood pressure log to monitor his BP.  Patient will obtain anti diarrheal and alert the office of 4 or more loose stools above baseline.  Patient informed Sutent should be temporarily interrupted for major surgical procedures and can be restarted upon recovery from surgery.     Reviewed with patient importance of keeping a medication schedule and plan for any missed doses. No barriers to medication adherence identified.  Medication reconciliation performed and medication/allergy list updated.  Insurance authorization for Sutent has been obtained. Test claim at the pharmacy revealed copayment $300 for 1st fill of Sunitinib - copay card was obtained for patient to bring out of pocket cost to . This will ship from the [facility] on 04/01/21 to deliver to patient's home on 04/02/21.  Patient informed the pharmacy will reach out 5-7 days prior to needing next fill of Sutent to coordinate continued medication acquisition to prevent break in therapy.  All questions answered.  Mr. Morrell voiced understanding and appreciation.   Medication  education handout placed in mail for patient. Patient knows to call the office with questions or concerns. Oral Chemotherapy Clinic phone number provided to patient.   Leron Croak, PharmD, BCPS Hematology/Oncology Clinical Pharmacist [facility] [place] [phone] 04/01/2021 2:37 PM

## 2021-04-02 ENCOUNTER — Telehealth: Payer: Self-pay | Admitting: Physician Assistant

## 2021-04-02 NOTE — Telephone Encounter (Signed)
Scheduled per sch msg. Called and left msg about change in date and time for appt

## 2021-04-12 ENCOUNTER — Other Ambulatory Visit: Payer: 59

## 2021-04-12 ENCOUNTER — Ambulatory Visit: Payer: 59 | Admitting: Physician Assistant

## 2021-04-13 NOTE — Progress Notes (Deleted)
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 Roxan Hockey.  CURRENT THERAPY: Sunitinib 3 7.5 mg p.o. daily.  First dose 04/08/21  INTERVAL HISTORY: Mathew [name] 60 y.o. male returns to clinic today for follow-up visit.  The patient is feeling fairly well today without any concerning complaints.  At his last appointment, the patient had evidence of disease recurrence and he was given several options.  The patient decided to proceed with oral treatment with Sunitinib for which she took his first dose on 04/08/2021.  The patient has not noticed any unusual side effects.  Denies any fever, chills, night sweats, unexplained weight loss, unusual fatigue, diarrhea, nausea, vomiting, or mucositis.  Denies any rashes or skin changes.  Denies any abnormal bleeding or bruising.  Denies any shortness of breath, cough, hemoptysis, or chest pain.  The patient tends to have high blood pressure at his appointments for which she is not prescribed any antihypertensives.  He was advised by the oral chemotherapy pharmacist to keep a log of his blood pressure readings which he has ***.  The patient is here today for evaluation and repeat blood work. MEDICAL HISTORY: Past Medical History:  Diagnosis Date   Arthritis    osteo   Asthma    asthma as a child   Clubbing of nails    Deafness in left ear    GERD (gastroesophageal reflux disease)    History of kidney stones    Pneumonia    Shortness of breath dyspnea    just initially   Vitamin D deficiency    White coat syndrome with high blood pressure without hypertension    4 years ago    ALLERGIES:  is allergic to aspirin and  penicillins.  MEDICATIONS:  Current Outpatient Medications  Medication Sig Dispense Refill   Acetylcysteine (NAC 600) 600 MG CAPS Take 600 mg by mouth daily.     Ascorbic Acid (VITAMIN C) 1000 MG tablet Take 1,000 mg by mouth daily. Raw     Cholecalciferol (VITAMIN D3) 50 MCG (2000 UT) capsule Take 1 capsule (2,000 Units total) by mouth daily. 1 capsule 0   Misc Natural Products (ELDERBERRY ZINC/VIT C/IMMUNE) LOZG Take 2 tablets by mouth in the morning and at bedtime. Gummie     Multiple Vitamins-Minerals (ZINC PO) Take 1.5 mg by mouth daily.     Quercetin 250 MG TABS Take 500 mg by mouth daily. 50 mg Bromelain 50 mg calcium     SUNItinib (SUTENT) 37.5 MG capsule Take 1 capsule (37.5 mg total) by mouth daily. 30 capsule 3   No current facility-administered medications for this visit.    SURGICAL HISTORY:  Past Surgical History:  Procedure Laterality Date   INTERCOSTAL NERVE BLOCK Right 04/27/2020   Procedure: INTERCOSTAL NERVE BLOCK;  Surgeon: [name], MD;  Location: [facility];  Service: Thoracic;  Laterality: Right;   KNEE CARTILAGE SURGERY Left 1976   pleural tumor Right 01/22/2016   resection of solitary fibrous tumor   RESECTION OF MEDIASTINAL MASS Right 01/23/2016   this is in error- no mediastinal tumor   THORACOTOMY Right 04/27/2020   Procedure: THORACOTOMY;  Surgeon: [name], MD;  Location: [facility];  Service:  Thoracic;  Laterality: Right;   TONSILLECTOMY     VIDEO BRONCHOSCOPY WITH ENDOBRONCHIAL ULTRASOUND N/A 12/17/2015   Procedure: VIDEO BRONCHOSCOPY WITH ENDOBRONCHIAL ULTRASOUND;  Surgeon: [name], MD;  Location: [facility];  Service: Thoracic;  Laterality: N/A;    REVIEW OF SYSTEMS:   Review of Systems  Constitutional: Negative for appetite change, chills, fatigue, fever and unexpected weight change.  HENT:   Negative for mouth sores, nosebleeds, sore throat and trouble swallowing.   Eyes: Negative for eye problems and icterus.   Respiratory: Negative for cough, hemoptysis, shortness of breath and wheezing.   Cardiovascular: Negative for chest pain and leg swelling.  Gastrointestinal: Negative for abdominal pain, constipation, diarrhea, nausea and vomiting.  Genitourinary: Negative for bladder incontinence, difficulty urinating, dysuria, frequency and hematuria.   Musculoskeletal: Negative for back pain, gait problem, neck pain and neck stiffness.  Skin: Negative for itching and rash.  Neurological: Negative for dizziness, extremity weakness, gait problem, headaches, light-headedness and seizures.  Hematological: Negative for adenopathy. Does not bruise/bleed easily.  Psychiatric/Behavioral: Negative for confusion, depression and sleep disturbance. The patient is not nervous/anxious.     PHYSICAL EXAMINATION:  There were no vitals taken for this visit.  ECOG PERFORMANCE STATUS: {CHL ONC ECOG [id number]  Physical Exam  Constitutional: Oriented to person, place, and time and well-developed, well-nourished, and in no distress. No distress.  HENT:  Head: Normocephalic and atraumatic.  Mouth/Throat: Oropharynx is clear and moist. No oropharyngeal exudate.  Eyes: Conjunctivae are normal. Right eye exhibits no discharge. Left eye exhibits no discharge. No scleral icterus.  Neck: Normal range of motion. Neck supple.  Cardiovascular: Normal rate, regular rhythm, normal heart sounds and intact distal pulses.   Pulmonary/Chest: Effort normal and breath sounds normal. No respiratory distress. No wheezes. No rales.  Abdominal: Soft. Bowel sounds are normal. Exhibits no distension and no mass. There is no tenderness.  Musculoskeletal: Normal range of motion. Exhibits no edema.  Lymphadenopathy:    No cervical adenopathy.  Neurological: Alert and oriented to person, place, and time. Exhibits normal muscle tone. Gait normal. Coordination normal.  Skin: Skin is warm and dry. No rash noted. Not diaphoretic. No erythema. No  pallor.  Psychiatric: Mood, memory and judgment normal.  Vitals reviewed.  LABORATORY DATA: Lab Results  Component Value Date   WBC 8.2 07/04/2020   HGB 14.0 07/04/2020   HCT 43.4 07/04/2020   MCV 92.9 07/04/2020   PLT 157 07/04/2020      Chemistry      Component Value Date/Time   NA 140 07/04/2020 1339   K 4.2 07/04/2020 1339   CL 105 07/04/2020 1339   CO2 23 07/04/2020 1339   BUN 27 (H) 07/04/2020 1339   CREATININE 1.28 (H) 07/04/2020 1339      Component Value Date/Time   CALCIUM 9.0 07/04/2020 1339   ALKPHOS 101 07/04/2020 1339   AST 30 07/04/2020 1339   ALT 23 07/04/2020 1339   BILITOT 0.5 07/04/2020 1339       RADIOGRAPHIC STUDIES:  CT Chest Wo Contrast  Result Date: 03/20/2021 CLINICAL DATA:  Non-small-cell lung cancer. Solitary fibrous tumor of the pleura. Restaging. EXAM: CT CHEST WITHOUT CONTRAST TECHNIQUE: Multidetector CT imaging of the chest was performed following the standard protocol without IV contrast. COMPARISON:  08/10/2020 FINDINGS: Cardiovascular: The heart size is normal. No substantial pericardial effusion. Coronary artery calcification is evident. No discernible atherosclerotic calcification in the thoracic aorta. Mediastinum/Nodes: No mediastinal lymphadenopathy. No evidence for gross hilar lymphadenopathy although assessment is  limited by the lack of intravenous contrast on the current study. The esophagus has normal imaging features. There is no axillary lymphadenopathy. Lungs/Pleura: Surgical changes with architectural distortion and scarring again noted posterior right lung, similar in the interval although there is a new 14 mm nodular component posteriorly on image 100/3 (compare to image 89/7 previously). No suspicious pulmonary nodule or mass in the left lung. 4 mm left lower lobe pulmonary nodule on 92/3 is similar to prior. 2 mm left upper lobe nodule on 74/3 is stable. No focal airspace consolidation.  No pleural effusion. Upper Abdomen:  Unremarkable. Musculoskeletal: No worrisome lytic or sclerotic osseous abnormality. IMPRESSION: 1. Surgical changes in the posterior right lung with architectural distortion and scarring, similar in the interval although there is a new 14 mm nodular component posteriorly on today's exam. Close attention on follow-up recommended. 2. Stable tiny left lung nodules. 3. Aortic Atherosclerosis (ICD10-I70.0). Electronically Signed   By: Misty Stanley M.D.   On: 03/20/2021 07:29     ASSESSMENT/PLAN:  This is a very pleasant 60 year old male with recurrent solitary fibrous tumor of the right lung.  He was initially diagnosed in June 2020 17.  He is status post resection by Dr. Roxan Hockey.  He had evidence of disease recurrence in October 2001 underwent re reresection.  The patient has been on observation since his resection.  There is no role for adjuvant therapy for his condition.  He was seen by Dr. Kendall Flack at [facility] who recommended him for observation for consideration of treatment with sunitinib followed by surgery if the patient has disease recurrence.  The patient recently had a restaging CT scan which showed stable disease except for a new 1.4 cm nodular component posteriorly.  Dr. Julien Nordmann gave the patient several options including observation and close monitoring versus consideration of treatment with sunitinib 37.5 mg daily followed by imaging studies in 3 months.   He started this treatment on 04/08/21 and is tolerating it well without any concerning adverse side effects.   The patient was seen with Dr. Julien Nordmann. Labs were reviewed. Recommend that he *** with treatment at the same dose.   We will see him back for a follow up visit in 2 weeks for evaluation and repeat blood work.   bP.  The patient was advised to call immediately if he has any concerning symptoms in the interval. The patient voices understanding of current disease status and treatment options and is in agreement  with the current care plan. All questions were answered. The patient knows to call the clinic with any problems, questions or concerns. We can certainly see the patient much sooner if necessary       No orders of the defined types were placed in this encounter.    I spent {CHL ONC TIME VISIT - GNFAO:[id number]} counseling the patient face to face. The total time spent in the appointment was {CHL ONC TIME VISIT - NGEXB:[id number]}.  Clay Menser L Willett Lefeber, PA-C 04/13/21

## 2021-04-15 ENCOUNTER — Inpatient Hospital Stay: Payer: 59 | Admitting: Physician Assistant

## 2021-04-15 ENCOUNTER — Inpatient Hospital Stay: Payer: 59

## 2021-04-22 ENCOUNTER — Other Ambulatory Visit (HOSPITAL_COMMUNITY): Payer: Self-pay

## 2021-04-22 ENCOUNTER — Telehealth: Payer: Self-pay | Admitting: Medical Oncology

## 2021-04-22 NOTE — Telephone Encounter (Signed)
Reports his BP is going up . It is measuring @ 160/98-160/100. He said he was told to call if it happened.

## 2021-04-23 ENCOUNTER — Other Ambulatory Visit (HOSPITAL_COMMUNITY): Payer: Self-pay

## 2021-04-23 NOTE — Progress Notes (Signed)
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 Roxan Hockey.  CURRENT THERAPY: Sunitinib 37.5 mg p.o. daily.  First dose 04/08/21.  INTERVAL HISTORY: Mathew [name] 60 y.o. male returns to clinic today for a follow-up visit.  The patient is feeling fairly well today without any concerning complaints.  At his last appointment, the patient had evidence of disease recurrence and he was given several options.  The patient decided to proceed with oral treatment with Sunitinib for which he took his first dose on 04/08/2021.  The patient has not noticed any unusual side effects except he had mild nausea for 2-3 days the first week which has since resolved. He also had 1 mouth sore. Denies any fever, chills, night sweats, unexplained weight loss, unusual fatigue, diarrhea, vomiting, or mucositis.  Denies any rashes or skin changes.  Denies any abnormal bleeding or bruising.  Denies any shortness of breath, cough, hemoptysis, or chest pain.  The patient tends to have high blood pressure at his appointments. He was started on Norvasc last week by his PCP, he is unsure which dose of norvasc. He takes this at 6 PM at night; therefore, he has not taken it yet today. He was advised by the oral chemotherapy pharmacist to keep a log of his blood pressure readings which he has been doing which is high. Even after taking his anti-hypotensives, his BP he states is 712+ systolic. He denies headaches, chest pain, or visual changes. The patient is here today for evaluation and repeat blood work.  MEDICAL HISTORY: Past Medical History:  Diagnosis Date   Arthritis    osteo   Asthma    asthma as a child   Clubbing  of nails    Deafness in left ear    GERD (gastroesophageal reflux disease)    History of kidney stones    Pneumonia    Shortness of breath dyspnea    just initially   Vitamin D deficiency    White coat syndrome with high blood pressure without hypertension    4 years ago    ALLERGIES:  is allergic to aspirin and penicillins.  MEDICATIONS:  Current Outpatient Medications  Medication Sig Dispense Refill   Acetylcysteine (NAC 600) 600 MG CAPS Take 600 mg by mouth daily.     amLODipine (NORVASC) 5 MG tablet amlodipine 5 mg tablet  Take 1 tablet every day by oral route.     Ascorbic Acid (VITAMIN C) 1000 MG tablet Take 1,000 mg by mouth daily. Raw     Cholecalciferol (VITAMIN D3) 50 MCG (2000 UT) capsule Take 1 capsule (2,000 Units total) by mouth daily. 1 capsule 0   Misc Natural Products (ELDERBERRY ZINC/VIT C/IMMUNE) LOZG Take 2 tablets by mouth in the morning and at bedtime. Gummie     Multiple Vitamins-Minerals (ZINC PO) Take 1.5 mg by mouth daily.     Quercetin 250 MG TABS Take 500 mg by mouth daily. 50 mg Bromelain 50 mg calcium     SUNItinib (SUTENT) 37.5 MG capsule Take 1 capsule (37.5 mg total) by mouth daily. 30 capsule 3   Current Facility-Administered Medications  Medication Dose Route Frequency Provider Last Rate Last Admin   cloNIDine (CATAPRES) tablet  0.2 mg  0.2 mg Oral Once Donathan Buller L, PA-C        SURGICAL HISTORY:  Past Surgical History:  Procedure Laterality Date   INTERCOSTAL NERVE BLOCK Right 04/27/2020   Procedure: INTERCOSTAL NERVE BLOCK;  Surgeon: [name], MD;  Location: [facility];  Service: Thoracic;  Laterality: Right;   KNEE CARTILAGE SURGERY Left 1976   pleural tumor Right 01/22/2016   resection of solitary fibrous tumor   RESECTION OF MEDIASTINAL MASS Right 01/23/2016   this is in error- no mediastinal tumor   THORACOTOMY Right 04/27/2020   Procedure: THORACOTOMY;  Surgeon: [name], MD;  Location: [facility];   Service: Thoracic;  Laterality: Right;   TONSILLECTOMY     VIDEO BRONCHOSCOPY WITH ENDOBRONCHIAL ULTRASOUND N/A 12/17/2015   Procedure: VIDEO BRONCHOSCOPY WITH ENDOBRONCHIAL ULTRASOUND;  Surgeon: [name], MD;  Location: [facility];  Service: Thoracic;  Laterality: N/A;    REVIEW OF SYSTEMS:   Review of Systems  Constitutional: Negative for appetite change, chills, fatigue, fever and unexpected weight change.  HENT: Negative for mouth sores (resolved), nosebleeds, sore throat and trouble swallowing.   Eyes: Negative for eye problems and icterus.  Respiratory: Negative for cough, hemoptysis, shortness of breath and wheezing.   Cardiovascular: Negative for chest pain and leg swelling.  Gastrointestinal: Negative for abdominal pain, constipation, diarrhea, nausea (resolved) and vomiting.  Genitourinary: Negative for bladder incontinence, difficulty urinating, dysuria, frequency and hematuria.   Musculoskeletal: Negative for back pain, gait problem, neck pain and neck stiffness.  Skin: Negative for itching and rash.  Neurological: Negative for dizziness, extremity weakness, gait problem, headaches, light-headedness and seizures.  Hematological: Negative for adenopathy. Does not bruise/bleed easily.  Psychiatric/Behavioral: Negative for confusion, depression and sleep disturbance. The patient is not nervous/anxious.     PHYSICAL EXAMINATION:  Blood pressure (!) 189/107, pulse (!) 58, temperature 97.7 F (36.5 C), temperature source Temporal, resp. rate 16, height 5\' 9"  (1.753 m), weight 178 lb 1.6 oz (80.8 kg), SpO2 100 %.  ECOG PERFORMANCE STATUS: 1  Physical Exam  Constitutional: Oriented to person, place, and time and well-developed, well-nourished, and in no distress.  HENT:  Head: Normocephalic and atraumatic.  Mouth/Throat: Oropharynx is clear and moist. No oropharyngeal exudate.  Eyes: Conjunctivae are normal. Right eye exhibits no discharge. Left eye exhibits no discharge. No  scleral icterus.  Neck: Normal range of motion. Neck supple.  Cardiovascular: Bradycardic, regular rhythm, normal heart sounds and intact distal pulses.   Pulmonary/Chest: Effort normal and breath sounds normal. No respiratory distress. No wheezes. No rales.  Abdominal: Soft. Bowel sounds are normal. Exhibits no distension and no mass. There is no tenderness.  Musculoskeletal: Normal range of motion. Exhibits no edema.  Lymphadenopathy:    No cervical adenopathy.  Neurological: Alert and oriented to person, place, and time. Exhibits normal muscle tone. Gait normal. Coordination normal.  Skin: Skin is warm and dry. No rash noted. Not diaphoretic. No erythema. No pallor.  Psychiatric: Mood, memory and judgment normal.  Vitals reviewed.  LABORATORY DATA: Lab Results  Component Value Date   WBC 4.4 04/29/2021   HGB 14.0 04/29/2021   HCT 43.0 04/29/2021   MCV 94.1 04/29/2021   PLT 144 (L) 04/29/2021      Chemistry      Component Value Date/Time   NA 139 04/29/2021 1443   K 4.0 04/29/2021 1443   CL 104 04/29/2021 1443   CO2 27 04/29/2021 1443   BUN 18 04/29/2021 1443  CREATININE 1.33 (H) 04/29/2021 1443      Component Value Date/Time   CALCIUM 8.8 (L) 04/29/2021 1443   ALKPHOS 107 04/29/2021 1443   AST 23 04/29/2021 1443   ALT 18 04/29/2021 1443   BILITOT 0.7 04/29/2021 1443       RADIOGRAPHIC STUDIES:  No results found.   ASSESSMENT/PLAN:  This is a very pleasant 60 year old male with recurrent solitary fibrous tumor of the right lung.  He was initially diagnosed in June 2020 17.  He is status post resection by Dr. Roxan Hockey.  He had evidence of disease recurrence in October 2001 underwent re reresection.  The patient has been on observation since his resection.  There is no role for adjuvant therapy for his condition.  He was seen by Dr. Kendall Flack at [facility] who recommended him for observation for consideration of treatment with sunitinib followed by  surgery if the patient has disease recurrence.  The patient recently had a restaging CT scan which showed stable disease except for a new 1.4 cm nodular component posteriorly.  Dr. Julien Nordmann gave the patient several options including observation and close monitoring versus consideration of treatment with sunitinib 37.5 mg daily followed by imaging studies in 3 months.   He started this treatment on 04/08/21 and is tolerating it well without any concerning adverse side effects.   He has issues with blood pressure at baseline. He recently started on norvasc 5 mg p.o. daily at night time. Called his pharmacy to verify dose.   The patient was seen with Dr. Julien Nordmann. His creatinine is slightly more elevated at 1.33 today. Dr. Julien Nordmann recommends he continue on the same dose with close monitoring. We will see him back for labs and follow up in 2 weeks. Recommend that he continue on with the same treatment at the same dose.   We will administer clonidine 0.2 mg while in the clinic today. The patient's wife is driving home as this can cause drowsiness. The patient denies headaches, visual changes, chest pain, etc. Advised to monitor his BP closely but to follow up for dose adjustment soon with his PCP. If he is symptomatic with headaches, visual changes, chest pain, etc, advised to seek emergency evaluation.   The patient was advised to call immediately if he has any concerning symptoms in the interval. The patient voices understanding of current disease status and treatment options and is in agreement with the current care plan. All questions were answered. The patient knows to call the clinic with any problems, questions or concerns. We can certainly see the patient much sooner if necessary   Orders Placed This Encounter  Procedures   CBC with Differential ([facility] Only)    Standing Status:   Future    Standing Expiration Date:   04/29/2022   CMP ([facility] only)    Standing Status:   Future     Standing Expiration Date:   04/29/2022      Mathew Sos Camey Edell, PA-C 04/29/21  ADDENDUM: Hematology/Oncology Attending: I had a face-to-face encounter with the patient today.  I reviewed his record, lab and recommended his care plan.  This is a very pleasant 60 years old African-American male with recurrent fibrous tumor of the right lung status post resection twice under the care of Dr. Roxan Hockey.  Recent imaging studies showed disease progression with enlargement of right pleural-based nodule.  The patient is currently undergoing treatment with sunitinib 37.5 mg p.o. daily started 3 weeks ago.  He has been tolerating  this treatment well except for uncontrolled hypertension.  He started 5 days ago by his primary care physician on Norvasc 5 mg p.o. daily but he continues to have hypertension. Will give the patient a dose of clonidine 0.2 mg p.o. x1 in the clinic today but he was also advised to follow-up with Dr. Karlton Lemon for consideration of increasing his dose of Norvasc or adding another blood pressure medication to his regimen. We will see the patient back for follow-up visit in around 2 weeks for evaluation and repeat blood work because of the worsening renal insufficiency which could be secondary to sunitinib or the worsening hypertension. The patient was advised to call immediately if he has any other concerning symptoms in the interval. Disclaimer: This note was dictated with voice recognition software. Similar sounding words can inadvertently be transcribed and may be missed upon review. Eilleen Kempf, MD 04/29/21

## 2021-04-24 ENCOUNTER — Other Ambulatory Visit (HOSPITAL_COMMUNITY): Payer: Self-pay

## 2021-04-25 ENCOUNTER — Telehealth: Payer: Self-pay | Admitting: Physician Assistant

## 2021-04-25 NOTE — Telephone Encounter (Signed)
I am scheduled to see this patient on Monday, however, he called reporting elevated BP. Per Dr. Julien Nordmann, we could prescribe 5 mg of Norvasc vs. Having him follow up with his PCP for management. I was calling him to review this and to see if he had started any anti-hypertensive medications. Unable to reach him and I left a voicemail asking him to call back. If unable to reach him, I will discuss this at more length when I see him on 04/29/21. If he calls back opting to start norvasc 5 mg, I will be happy to send that prescription to his pharmacy.

## 2021-04-29 ENCOUNTER — Inpatient Hospital Stay: Payer: 59 | Attending: Internal Medicine

## 2021-04-29 ENCOUNTER — Other Ambulatory Visit: Payer: Self-pay

## 2021-04-29 ENCOUNTER — Inpatient Hospital Stay: Payer: 59

## 2021-04-29 ENCOUNTER — Inpatient Hospital Stay (HOSPITAL_BASED_OUTPATIENT_CLINIC_OR_DEPARTMENT_OTHER): Payer: 59 | Admitting: Physician Assistant

## 2021-04-29 ENCOUNTER — Other Ambulatory Visit (HOSPITAL_COMMUNITY): Payer: Self-pay

## 2021-04-29 VITALS — BP 189/107 | HR 58 | Temp 97.7°F | Resp 16 | Ht 69.0 in | Wt 178.1 lb

## 2021-04-29 DIAGNOSIS — I159 Secondary hypertension, unspecified: Secondary | ICD-10-CM

## 2021-04-29 DIAGNOSIS — K219 Gastro-esophageal reflux disease without esophagitis: Secondary | ICD-10-CM | POA: Insufficient documentation

## 2021-04-29 DIAGNOSIS — E559 Vitamin D deficiency, unspecified: Secondary | ICD-10-CM | POA: Insufficient documentation

## 2021-04-29 DIAGNOSIS — Z87442 Personal history of urinary calculi: Secondary | ICD-10-CM | POA: Insufficient documentation

## 2021-04-29 DIAGNOSIS — I10 Essential (primary) hypertension: Secondary | ICD-10-CM | POA: Diagnosis not present

## 2021-04-29 DIAGNOSIS — Z9889 Other specified postprocedural states: Secondary | ICD-10-CM

## 2021-04-29 DIAGNOSIS — C3411 Malignant neoplasm of upper lobe, right bronchus or lung: Secondary | ICD-10-CM | POA: Diagnosis not present

## 2021-04-29 DIAGNOSIS — Z79899 Other long term (current) drug therapy: Secondary | ICD-10-CM | POA: Diagnosis not present

## 2021-04-29 DIAGNOSIS — R918 Other nonspecific abnormal finding of lung field: Secondary | ICD-10-CM

## 2021-04-29 DIAGNOSIS — M1990 Unspecified osteoarthritis, unspecified site: Secondary | ICD-10-CM | POA: Insufficient documentation

## 2021-04-29 LAB — CBC WITH DIFFERENTIAL (CANCER CENTER ONLY)
Abs Immature Granulocytes: 0.01 10*3/uL (ref 0.00–0.07)
Basophils Absolute: 0 10*3/uL (ref 0.0–0.1)
Basophils Relative: 0 %
Eosinophils Absolute: 0.1 10*3/uL (ref 0.0–0.5)
Eosinophils Relative: 2 %
HCT: 43 % (ref 39.0–52.0)
Hemoglobin: 14 g/dL (ref 13.0–17.0)
Immature Granulocytes: 0 %
Lymphocytes Relative: 32 %
Lymphs Abs: 1.4 10*3/uL (ref 0.7–4.0)
MCH: 30.6 pg (ref 26.0–34.0)
MCHC: 32.6 g/dL (ref 30.0–36.0)
MCV: 94.1 fL (ref 80.0–100.0)
Monocytes Absolute: 0.3 10*3/uL (ref 0.1–1.0)
Monocytes Relative: 7 %
Neutro Abs: 2.6 10*3/uL (ref 1.7–7.7)
Neutrophils Relative %: 59 %
Platelet Count: 144 10*3/uL — ABNORMAL LOW (ref 150–400)
RBC: 4.57 MIL/uL (ref 4.22–5.81)
RDW: 13.2 % (ref 11.5–15.5)
WBC Count: 4.4 10*3/uL (ref 4.0–10.5)
nRBC: 0 % (ref 0.0–0.2)

## 2021-04-29 LAB — CMP (CANCER CENTER ONLY)
ALT: 18 U/L (ref 0–44)
AST: 23 U/L (ref 15–41)
Albumin: 3.9 g/dL (ref 3.5–5.0)
Alkaline Phosphatase: 107 U/L (ref 38–126)
Anion gap: 8 (ref 5–15)
BUN: 18 mg/dL (ref 8–23)
CO2: 27 mmol/L (ref 22–32)
Calcium: 8.8 mg/dL — ABNORMAL LOW (ref 8.9–10.3)
Chloride: 104 mmol/L (ref 98–111)
Creatinine: 1.33 mg/dL — ABNORMAL HIGH (ref 0.61–1.24)
GFR, Estimated: >60 mL/min (ref >60)
Glucose, Bld: 85 mg/dL (ref 70–99)
Potassium: 4 mmol/L (ref 3.5–5.1)
Sodium: 139 mmol/L (ref 135–145)
Total Bilirubin: 0.7 mg/dL (ref 0.3–1.2)
Total Protein: 6.8 g/dL (ref 6.5–8.1)

## 2021-04-29 MED ORDER — CLONIDINE HCL 0.1 MG PO TABS: 0.2000 mg | ORAL_TABLET | Freq: Once | ORAL | Status: DC

## 2021-04-29 MED ORDER — CLONIDINE HCL 0.1 MG PO TABS
0.2000 mg | ORAL_TABLET | Freq: Once | ORAL | Status: AC
  Administered 2021-04-29: 0.2 mg via ORAL
  Filled 2021-04-29: qty 2

## 2021-04-30 ENCOUNTER — Telehealth: Payer: Self-pay | Admitting: Internal Medicine

## 2021-04-30 NOTE — Telephone Encounter (Signed)
Sch per 11/14 los, pt aware

## 2021-05-01 ENCOUNTER — Other Ambulatory Visit (HOSPITAL_COMMUNITY): Payer: Self-pay

## 2021-05-02 ENCOUNTER — Other Ambulatory Visit (HOSPITAL_COMMUNITY): Payer: Self-pay

## 2021-05-03 ENCOUNTER — Other Ambulatory Visit (HOSPITAL_COMMUNITY): Payer: Self-pay

## 2021-05-15 ENCOUNTER — Encounter: Payer: Self-pay | Admitting: Internal Medicine

## 2021-05-15 ENCOUNTER — Other Ambulatory Visit: Payer: Self-pay

## 2021-05-15 ENCOUNTER — Inpatient Hospital Stay (HOSPITAL_BASED_OUTPATIENT_CLINIC_OR_DEPARTMENT_OTHER): Payer: 59 | Admitting: Internal Medicine

## 2021-05-15 ENCOUNTER — Inpatient Hospital Stay: Payer: 59

## 2021-05-15 VITALS — BP 157/104 | HR 73 | Temp 96.6°F | Resp 17 | Ht 69.0 in | Wt 179.8 lb

## 2021-05-15 DIAGNOSIS — Z5111 Encounter for antineoplastic chemotherapy: Secondary | ICD-10-CM

## 2021-05-15 DIAGNOSIS — R918 Other nonspecific abnormal finding of lung field: Secondary | ICD-10-CM

## 2021-05-15 DIAGNOSIS — I159 Secondary hypertension, unspecified: Secondary | ICD-10-CM

## 2021-05-15 DIAGNOSIS — C3411 Malignant neoplasm of upper lobe, right bronchus or lung: Secondary | ICD-10-CM | POA: Diagnosis not present

## 2021-05-15 LAB — CBC WITH DIFFERENTIAL (CANCER CENTER ONLY)
Abs Immature Granulocytes: 0 10*3/uL (ref 0.00–0.07)
Basophils Absolute: 0 10*3/uL (ref 0.0–0.1)
Basophils Relative: 0 %
Eosinophils Absolute: 0.1 10*3/uL (ref 0.0–0.5)
Eosinophils Relative: 2 %
HCT: 41.5 % (ref 39.0–52.0)
Hemoglobin: 13.8 g/dL (ref 13.0–17.0)
Immature Granulocytes: 0 %
Lymphocytes Relative: 54 %
Lymphs Abs: 1.7 10*3/uL (ref 0.7–4.0)
MCH: 31.2 pg (ref 26.0–34.0)
MCHC: 33.3 g/dL (ref 30.0–36.0)
MCV: 93.9 fL (ref 80.0–100.0)
Monocytes Absolute: 0.2 10*3/uL (ref 0.1–1.0)
Monocytes Relative: 7 %
Neutro Abs: 1.1 10*3/uL — ABNORMAL LOW (ref 1.7–7.7)
Neutrophils Relative %: 37 %
Platelet Count: 105 10*3/uL — ABNORMAL LOW (ref 150–400)
RBC: 4.42 MIL/uL (ref 4.22–5.81)
RDW: 13.2 % (ref 11.5–15.5)
WBC Count: 3.1 10*3/uL — ABNORMAL LOW (ref 4.0–10.5)
nRBC: 0 % (ref 0.0–0.2)

## 2021-05-15 LAB — CMP (CANCER CENTER ONLY)
ALT: 23 U/L (ref 0–44)
AST: 30 U/L (ref 15–41)
Albumin: 4 g/dL (ref 3.5–5.0)
Alkaline Phosphatase: 82 U/L (ref 38–126)
Anion gap: 8 (ref 5–15)
BUN: 19 mg/dL (ref 8–23)
CO2: 30 mmol/L (ref 22–32)
Calcium: 8.8 mg/dL — ABNORMAL LOW (ref 8.9–10.3)
Chloride: 100 mmol/L (ref 98–111)
Creatinine: 1.19 mg/dL (ref 0.61–1.24)
GFR, Estimated: >60 mL/min (ref >60)
Glucose, Bld: 89 mg/dL (ref 70–99)
Potassium: 3.9 mmol/L (ref 3.5–5.1)
Sodium: 138 mmol/L (ref 135–145)
Total Bilirubin: 1 mg/dL (ref 0.3–1.2)
Total Protein: 6.3 g/dL — ABNORMAL LOW (ref 6.5–8.1)

## 2021-05-15 NOTE — Progress Notes (Signed)
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 Roxan Hockey.  CURRENT THERAPY: Sunitinib 3 7.5 mg p.o. daily.  First dose started April 08, 2021.  INTERVAL HISTORY: Mathew [name] 60 y.o. male returns to the clinic today for follow-up visit accompanied by his wife.  The patient is feeling fine today with no concerning complaints except for mild fatigue and hypertension secondary to his treatment with sunitinib.  The patient denied having any chest pain, shortness of breath, cough or hemoptysis.  He denied having any fever or chills.  He has no nausea, vomiting, diarrhea or constipation.  He has no headache or visual changes.  He is here today for evaluation and repeat blood work.  MEDICAL HISTORY: Past Medical History:  Diagnosis Date   Arthritis    osteo   Asthma    asthma as a child   Clubbing of nails    Deafness in left ear    GERD (gastroesophageal reflux disease)    History of kidney stones    Pneumonia    Shortness of breath dyspnea    just initially   Vitamin D deficiency    White coat syndrome with high blood pressure without hypertension    4 years ago    ALLERGIES:  is allergic to aspirin and penicillins.  MEDICATIONS:  Current Outpatient Medications  Medication Sig Dispense Refill   Acetylcysteine (NAC 600) 600 MG CAPS Take 600 mg by mouth daily.     amLODipine (NORVASC) 5 MG tablet amlodipine 5 mg tablet  Take 1 tablet every day by oral route.     Ascorbic Acid (VITAMIN C) 1000 MG tablet Take 1,000 mg by mouth daily. Raw     Cholecalciferol (VITAMIN D3) 50 MCG (2000 UT) capsule Take 1 capsule (2,000  Units total) by mouth daily. 1 capsule 0   Misc Natural Products (ELDERBERRY ZINC/VIT C/IMMUNE) LOZG Take 2 tablets by mouth in the morning and at bedtime. Gummie     Multiple Vitamins-Minerals (ZINC PO) Take 1.5 mg by mouth daily.     Quercetin 250 MG TABS Take 500 mg by mouth daily. 50 mg Bromelain 50 mg calcium     SUNItinib (SUTENT) 37.5 MG capsule Take 1 capsule (37.5 mg total) by mouth daily. 30 capsule 3   No current facility-administered medications for this visit.    SURGICAL HISTORY:  Past Surgical History:  Procedure Laterality Date   INTERCOSTAL NERVE BLOCK Right 04/27/2020   Procedure: INTERCOSTAL NERVE BLOCK;  Surgeon: [name], MD;  Location: [facility];  Service: Thoracic;  Laterality: Right;   KNEE CARTILAGE SURGERY Left 1976   pleural tumor Right 01/22/2016   resection of solitary fibrous tumor   RESECTION OF MEDIASTINAL MASS Right 01/23/2016   this is in error- no mediastinal tumor   THORACOTOMY Right 04/27/2020   Procedure: THORACOTOMY;  Surgeon: [name], MD;  Location: [facility];  Service: Thoracic;  Laterality: Right;   TONSILLECTOMY     VIDEO BRONCHOSCOPY WITH ENDOBRONCHIAL ULTRASOUND N/A 12/17/2015   Procedure: VIDEO BRONCHOSCOPY WITH ENDOBRONCHIAL ULTRASOUND;  Surgeon: [name], MD;  Location: [place];  Service: Thoracic;  Laterality: N/A;  REVIEW OF SYSTEMS:  A comprehensive review of systems was negative except for: Constitutional: positive for fatigue   PHYSICAL EXAMINATION: General appearance: alert, cooperative, fatigued, and no distress Head: Normocephalic, without obvious abnormality, atraumatic Neck: no adenopathy, no JVD, supple, symmetrical, trachea midline, and thyroid not enlarged, symmetric, no tenderness/mass/nodules Lymph nodes: Cervical, supraclavicular, and axillary nodes normal. Resp: clear to auscultation bilaterally Back: symmetric, no curvature. ROM normal. No CVA tenderness. Cardio: regular rate and rhythm,  S1, S2 normal, no murmur, click, rub or gallop GI: soft, non-tender; bowel sounds normal; no masses,  no organomegaly Extremities: extremities normal, atraumatic, no cyanosis or edema  ECOG PERFORMANCE STATUS: 1 - Symptomatic but completely ambulatory  Blood pressure (!) 157/104, pulse 73, temperature (!) 96.6 F (35.9 C), temperature source Tympanic, resp. rate 17, height 5\' 9"  (1.753 m), weight 179 lb 12.8 oz (81.6 kg), SpO2 100 %.  LABORATORY DATA: Lab Results  Component Value Date   WBC 3.1 (L) 05/15/2021   HGB 13.8 05/15/2021   HCT 41.5 05/15/2021   MCV 93.9 05/15/2021   PLT 105 (L) 05/15/2021      Chemistry      Component Value Date/Time   NA 139 04/29/2021 1443   K 4.0 04/29/2021 1443   CL 104 04/29/2021 1443   CO2 27 04/29/2021 1443   BUN 18 04/29/2021 1443   CREATININE 1.33 (H) 04/29/2021 1443      Component Value Date/Time   CALCIUM 8.8 (L) 04/29/2021 1443   ALKPHOS 107 04/29/2021 1443   AST 23 04/29/2021 1443   ALT 18 04/29/2021 1443   BILITOT 0.7 04/29/2021 1443       RADIOGRAPHIC STUDIES: No results found.  ASSESSMENT AND PLAN: This is a very pleasant 60 years old male with recurrent solitary fibrous tumor of the right lung diagnosed in June 2017 s/p resection followed by recurrence in October 2021 status post reresection under the care of Dr. Roxan Hockey. The patient has been on observation since his resection.  There was no role for adjuvant therapy for his condition.  He was seen by Dr. Kendall Flack at [facility] who recommended for him observation and consideration of treatment with sunitinib followed by surgery if the patient has disease recurrence He had repeat CT scan of the chest performed recently.  The scan showed stable disease except for a new 1.4 cm nodular component posteriorly. The patient is currently undergoing treatment with sunitinib 37.5 mg p.o. daily status post 5 weeks of treatment and he has been tolerating this treatment well  with no concerning adverse effects. I recommended for the patient to proceed with his treatment today as planned. I will see him back for follow-up visit and 1 months for evaluation and repeat blood work. For the hypertension, he will continue his current treatment with Norvasc 10 mg p.o. daily. The patient was advised to call immediately if he has any other concerning symptoms in the interval. The patient voices understanding of current disease status and treatment options and is in agreement with the current care plan.  All questions were answered. The patient knows to call the clinic with any problems, questions or concerns. We can certainly see the patient much sooner if necessary.   Disclaimer: This note was dictated with voice recognition software. Similar sounding words can inadvertently be transcribed and may not be corrected upon review.

## 2021-05-23 ENCOUNTER — Telehealth: Payer: Self-pay | Admitting: Internal Medicine

## 2021-05-23 NOTE — Telephone Encounter (Signed)
Sch per 11/30 los, pt aware

## 2021-05-27 ENCOUNTER — Other Ambulatory Visit (HOSPITAL_COMMUNITY): Payer: Self-pay

## 2021-05-28 ENCOUNTER — Other Ambulatory Visit (HOSPITAL_COMMUNITY): Payer: Self-pay

## 2021-05-29 ENCOUNTER — Other Ambulatory Visit (HOSPITAL_COMMUNITY): Payer: Self-pay

## 2021-05-30 ENCOUNTER — Other Ambulatory Visit (HOSPITAL_COMMUNITY): Payer: Self-pay

## 2021-06-04 ENCOUNTER — Other Ambulatory Visit: Payer: Self-pay

## 2021-06-04 ENCOUNTER — Inpatient Hospital Stay: Payer: 59 | Attending: Internal Medicine

## 2021-06-04 ENCOUNTER — Inpatient Hospital Stay (HOSPITAL_BASED_OUTPATIENT_CLINIC_OR_DEPARTMENT_OTHER): Payer: 59 | Admitting: Internal Medicine

## 2021-06-04 VITALS — BP 155/84 | HR 68 | Temp 96.1°F | Resp 17 | Ht 69.0 in | Wt 184.2 lb

## 2021-06-04 DIAGNOSIS — R918 Other nonspecific abnormal finding of lung field: Secondary | ICD-10-CM

## 2021-06-04 DIAGNOSIS — D492 Neoplasm of unspecified behavior of bone, soft tissue, and skin: Secondary | ICD-10-CM

## 2021-06-04 DIAGNOSIS — C3411 Malignant neoplasm of upper lobe, right bronchus or lung: Secondary | ICD-10-CM | POA: Insufficient documentation

## 2021-06-04 DIAGNOSIS — I10 Essential (primary) hypertension: Secondary | ICD-10-CM | POA: Diagnosis not present

## 2021-06-04 DIAGNOSIS — J45909 Unspecified asthma, uncomplicated: Secondary | ICD-10-CM | POA: Insufficient documentation

## 2021-06-04 DIAGNOSIS — Z79899 Other long term (current) drug therapy: Secondary | ICD-10-CM | POA: Diagnosis not present

## 2021-06-04 DIAGNOSIS — Z5111 Encounter for antineoplastic chemotherapy: Secondary | ICD-10-CM

## 2021-06-04 LAB — CMP (CANCER CENTER ONLY)
ALT: 22 U/L (ref 0–44)
AST: 27 U/L (ref 15–41)
Albumin: 3.9 g/dL (ref 3.5–5.0)
Alkaline Phosphatase: 81 U/L (ref 38–126)
Anion gap: 4 — ABNORMAL LOW (ref 5–15)
BUN: 19 mg/dL (ref 8–23)
CO2: 31 mmol/L (ref 22–32)
Calcium: 9.2 mg/dL (ref 8.9–10.3)
Chloride: 104 mmol/L (ref 98–111)
Creatinine: 1.38 mg/dL — ABNORMAL HIGH (ref 0.61–1.24)
GFR, Estimated: 58 mL/min — ABNORMAL LOW (ref >60)
Glucose, Bld: 93 mg/dL (ref 70–99)
Potassium: 4.3 mmol/L (ref 3.5–5.1)
Sodium: 139 mmol/L (ref 135–145)
Total Bilirubin: 0.9 mg/dL (ref 0.3–1.2)
Total Protein: 6.4 g/dL — ABNORMAL LOW (ref 6.5–8.1)

## 2021-06-04 LAB — CBC WITH DIFFERENTIAL (CANCER CENTER ONLY)
Abs Immature Granulocytes: 0.01 10*3/uL (ref 0.00–0.07)
Basophils Absolute: 0 10*3/uL (ref 0.0–0.1)
Basophils Relative: 0 %
Eosinophils Absolute: 0.1 10*3/uL (ref 0.0–0.5)
Eosinophils Relative: 2 %
HCT: 40.2 % (ref 39.0–52.0)
Hemoglobin: 13.5 g/dL (ref 13.0–17.0)
Immature Granulocytes: 0 %
Lymphocytes Relative: 48 %
Lymphs Abs: 1.7 10*3/uL (ref 0.7–4.0)
MCH: 32.2 pg (ref 26.0–34.0)
MCHC: 33.6 g/dL (ref 30.0–36.0)
MCV: 95.9 fL (ref 80.0–100.0)
Monocytes Absolute: 0.3 10*3/uL (ref 0.1–1.0)
Monocytes Relative: 7 %
Neutro Abs: 1.5 10*3/uL — ABNORMAL LOW (ref 1.7–7.7)
Neutrophils Relative %: 43 %
Platelet Count: 125 10*3/uL — ABNORMAL LOW (ref 150–400)
RBC: 4.19 MIL/uL — ABNORMAL LOW (ref 4.22–5.81)
RDW: 14.1 % (ref 11.5–15.5)
WBC Count: 3.4 10*3/uL — ABNORMAL LOW (ref 4.0–10.5)
nRBC: 0 % (ref 0.0–0.2)

## 2021-06-04 NOTE — Progress Notes (Signed)
[place] Telephone:(336) [phone]   Fax:(336) [phone]  OFFICE PROGRESS NOTE  [name], 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 Roxan Hockey.  CURRENT THERAPY: Sunitinib 3 7.5 mg p.o. daily.  First dose started April 08, 2021.  Status post 2 months of treatment.  INTERVAL HISTORY: Mathew [name] 60 y.o. male returns to the clinic today for follow-up visit accompanied by his wife.  The patient is feeling fine today with no concerning complaints except for occasional pain on the right side of the chest when he takes a deep breath.  He denied having any current shortness of breath, cough or hemoptysis.  He denied having any fever or chills.  He has no nausea, vomiting, diarrhea or constipation.  He has no headache or visual changes.  He denied having any significant weight loss or night sweats.  He continues to be very active and exercise at regular basis.  The patient is here today for evaluation and repeat blood work.  MEDICAL HISTORY: Past Medical History:  Diagnosis Date   Arthritis    osteo   Asthma    asthma as a child   Clubbing of nails    Deafness in left ear    GERD (gastroesophageal reflux disease)    History of kidney stones    Pneumonia    Shortness of breath dyspnea    just initially   Vitamin D deficiency    White coat syndrome with high blood pressure without hypertension    4 years ago    ALLERGIES:  is allergic to aspirin and penicillins.  MEDICATIONS:  Current Outpatient Medications  Medication Sig Dispense Refill   amLODipine (NORVASC) 5 MG tablet 10 mg.     Cholecalciferol (VITAMIN D3) 50 MCG (2000 UT) capsule Take 1 capsule (2,000 Units total) by mouth daily. 1 capsule 0    SUNItinib (SUTENT) 37.5 MG capsule Take 1 capsule (37.5 mg total) by mouth daily. 30 capsule 3   No current facility-administered medications for this visit.    SURGICAL HISTORY:  Past Surgical History:  Procedure Laterality Date   INTERCOSTAL NERVE BLOCK Right 04/27/2020   Procedure: INTERCOSTAL NERVE BLOCK;  Surgeon: [name], MD;  Location: [facility];  Service: Thoracic;  Laterality: Right;   KNEE CARTILAGE SURGERY Left 1976   pleural tumor Right 01/22/2016   resection of solitary fibrous tumor   RESECTION OF MEDIASTINAL MASS Right 01/23/2016   this is in error- no mediastinal tumor   THORACOTOMY Right 04/27/2020   Procedure: THORACOTOMY;  Surgeon: [name], MD;  Location: [facility];  Service: Thoracic;  Laterality: Right;   TONSILLECTOMY     VIDEO BRONCHOSCOPY WITH ENDOBRONCHIAL ULTRASOUND N/A 12/17/2015   Procedure: VIDEO BRONCHOSCOPY WITH ENDOBRONCHIAL ULTRASOUND;  Surgeon: [name], MD;  Location: [facility];  Service: Thoracic;  Laterality: N/A;    REVIEW OF SYSTEMS:  A comprehensive review of systems was negative except for: Respiratory: positive for pleurisy/chest pain   PHYSICAL EXAMINATION: General appearance: alert, cooperative, and no distress Head: Normocephalic, without obvious abnormality, atraumatic Neck: no adenopathy, no carotid bruit, no JVD, supple, symmetrical, trachea midline, and thyroid not enlarged, symmetric, no tenderness/mass/nodules Lymph nodes: Cervical, supraclavicular, and axillary nodes normal. Resp: clear to auscultation  bilaterally Back: symmetric, no curvature. ROM normal. No CVA tenderness. Cardio: regular rate and rhythm, S1, S2 normal, no murmur, click, rub or gallop GI: soft, non-tender; bowel sounds normal; no masses,  no organomegaly Extremities: extremities normal, atraumatic, no cyanosis or edema  ECOG PERFORMANCE STATUS: 1 - Symptomatic but completely ambulatory  Blood pressure (!) 155/84, pulse 68, temperature  (!) 96.1 F (35.6 C), temperature source Tympanic, resp. rate 17, height 5\' 9"  (1.753 m), weight 184 lb 3.2 oz (83.6 kg), SpO2 100 %.  LABORATORY DATA: Lab Results  Component Value Date   WBC 3.4 (L) 06/04/2021   HGB 13.5 06/04/2021   HCT 40.2 06/04/2021   MCV 95.9 06/04/2021   PLT 125 (L) 06/04/2021      Chemistry      Component Value Date/Time   NA 138 05/15/2021 1034   K 3.9 05/15/2021 1034   CL 100 05/15/2021 1034   CO2 30 05/15/2021 1034   BUN 19 05/15/2021 1034   CREATININE 1.19 05/15/2021 1034      Component Value Date/Time   CALCIUM 8.8 (L) 05/15/2021 1034   ALKPHOS 82 05/15/2021 1034   AST 30 05/15/2021 1034   ALT 23 05/15/2021 1034   BILITOT 1.0 05/15/2021 1034       RADIOGRAPHIC STUDIES: No results found.  ASSESSMENT AND PLAN: This is a very pleasant 60 years old male with recurrent solitary fibrous tumor of the right lung diagnosed in June 2017 s/p resection followed by recurrence in October 2021 status post reresection under the care of Dr. Roxan Hockey. The patient has been on observation since his resection.  There was no role for adjuvant therapy for his condition.  He was seen by Dr. Kendall Flack at [facility] who recommended for him observation and consideration of treatment with sunitinib followed by surgery if the patient has disease recurrence He had repeat CT scan of the chest performed recently.  The scan showed stable disease except for a new 1.4 cm nodular component posteriorly. The patient is currently undergoing treatment with sunitinib 37.5 mg p.o. daily status post 71-month of treatment.  The patient has been tolerating this treatment well with no concerning adverse effects. I recommended for him to continue his current treatment with sunitinib. I will see him back for follow-up visit in 1 months for evaluation with repeat CT scan of the chest for restaging of his disease. For the hypertension, he is doing much better and normally has normal  blood pressure at home.  He will continue his current treatment with Norvasc 10 mg p.o. daily. The patient was advised to call immediately if he has any other concerning symptoms in the interval. The patient voices understanding of current disease status and treatment options and is in agreement with the current care plan.  All questions were answered. The patient knows to call the clinic with any problems, questions or concerns. We can certainly see the patient much sooner if necessary.   Disclaimer: This note was dictated with voice recognition software. Similar sounding words can inadvertently be transcribed and may not be corrected upon review.

## 2021-06-06 ENCOUNTER — Other Ambulatory Visit: Payer: Self-pay

## 2021-06-06 ENCOUNTER — Ambulatory Visit (INDEPENDENT_AMBULATORY_CARE_PROVIDER_SITE_OTHER): Payer: 59 | Admitting: Urology

## 2021-06-06 ENCOUNTER — Encounter: Payer: Self-pay | Admitting: Urology

## 2021-06-06 VITALS — BP 164/92 | HR 60 | Ht 69.0 in | Wt 178.0 lb

## 2021-06-06 DIAGNOSIS — R972 Elevated prostate specific antigen [PSA]: Secondary | ICD-10-CM

## 2021-06-06 NOTE — Progress Notes (Signed)
06/06/21 3:06 PM   Mathew [name] [birth year] [id number]  CC: Elevated PSA  HPI: 60 year old male with history of rare solitary pulmonary fibrous tumor in 2016 treated with surgery that recurred and 2021 requiring repeat surgery and currently is on sunitinib.  He was recently found to have a mildly elevated PSA of 5 on 04/11/2021.  PSA was 3.8 in October 2021 and 2.1 in October 2020.  He had a PET scan in November 2021 for his pulmonary fibrous tumor that showed no evidence of adenopathy in the abdomen or metastatic disease.  He denies any urinary symptoms or erectile dysfunction.  Denies any family history of prostate or breast cancer.  He is very healthy otherwise and is a [employer] and continues to workout avidly.  PMH: Past Medical History:  Diagnosis Date   Arthritis    osteo   Asthma    asthma as a child   Clubbing of nails    Deafness in left ear    GERD (gastroesophageal reflux disease)    History of kidney stones    Pneumonia    Shortness of breath dyspnea    just initially   Vitamin D deficiency    White coat syndrome with high blood pressure without hypertension    4 years ago    Surgical History: Past Surgical History:  Procedure Laterality Date   INTERCOSTAL NERVE BLOCK Right 04/27/2020   Procedure: INTERCOSTAL NERVE BLOCK;  Surgeon: [name], MD;  Location: [facility];  Service: Thoracic;  Laterality: Right;   KNEE CARTILAGE SURGERY Left 1976   pleural tumor Right 01/22/2016   resection of solitary fibrous tumor   RESECTION OF MEDIASTINAL MASS Right 01/23/2016   this is in error- no mediastinal tumor   THORACOTOMY Right 04/27/2020   Procedure: THORACOTOMY;  Surgeon: [name], MD;  Location: [place];  Service: Thoracic;  Laterality: Right;   TONSILLECTOMY     VIDEO BRONCHOSCOPY WITH ENDOBRONCHIAL ULTRASOUND N/A 12/17/2015   Procedure: VIDEO BRONCHOSCOPY WITH ENDOBRONCHIAL ULTRASOUND;  Surgeon: [name], MD;  Location: [place] [facility];  Service: Thoracic;  Laterality: N/A;   Family History: Family History  Problem Relation Age of Onset   Diabetes type II Mother    Hypertension Father     Social History:  reports that he has never smoked. He quit smokeless tobacco use about 41 years ago.  His smokeless tobacco use included chew. He reports current alcohol use. He reports current drug use. Drug: Marijuana.  Physical Exam: BP (!) 164/92 (BP Location: Left Arm, Patient Position: Sitting, Cuff Size: Large)    Pulse 60    Ht 5\' 9"  (1.753 m)    Wt 178 lb (80.7 kg)    BMI 26.29 kg/m    Constitutional:  Alert and oriented, No acute distress. Cardiovascular: No clubbing, cyanosis, or edema. Respiratory: Normal respiratory effort, no increased work of breathing. GI: Abdomen is soft, nontender, nondistended, no abdominal masses DRE: 40 g, smooth, very subtle right sided nodule  Laboratory Data: PSA history reviewed, see HPI  Pertinent Imaging: I have personally viewed and interpreted the PET scan showing no evidence of abdominal lymphadenopathy.  Assessment & Plan:   60 year old male with history of pulmonary fibrous tumor with recurrence in 2021 treated with surgery and currently on sunitinib, apparently with good prognosis moving forward.  Found to have a elevated PSA of 5 which is increased slowly over the last few years.  We reviewed the implications of an elevated PSA and  the uncertainty surrounding it. In general, a man's PSA increases with age and is produced by both normal and cancerous prostate tissue. The differential diagnosis for elevated PSA includes BPH, prostate cancer, infection, recent intercourse/ejaculation, recent urethroscopic manipulation (foley placement/cystoscopy) or trauma, and prostatitis.   Management of an elevated PSA can include observation or prostate biopsy and we discussed this in detail. Our goal is to detect clinically significant prostate cancers, and manage with either active  surveillance, surgery, or radiation for localized disease. Risks of prostate biopsy include bleeding, infection (including life threatening sepsis), pain, and lower urinary symptoms. Hematuria, hematospermia, and blood in the stool are all common after biopsy and can persist up to 4 weeks.   Repeat PSA with reflex to free, if remains elevated will pursue prostate biopsy  Nickolas Madrid, MD 06/06/2021  [facility] [address], [place], [place] [address] [phone]

## 2021-06-06 NOTE — Patient Instructions (Addendum)
lab visit for PSA blood draw in 1-2 weeks, avoid any ejaculations or bike riding prior to lab visit  Prostate Cancer Screening Prostate cancer screening is testing that is done to check for the presence of prostate cancer in men. The prostate gland is a walnut-sized gland that is located below the bladder and in front of the rectum in males. The function of the prostate is to add fluid to semen during ejaculation. Prostate cancer is one of the most common types of cancer in men. Who should have prostate cancer screening? Screening recommendations vary based on age and other risk factors, as well as between the professional organizations who make the recommendations. In general, screening is recommended if: You are age 45 to 46 and have an average risk for prostate cancer. You should talk with your health care provider about your need for screening and how often screening should be done. Because most prostate cancers are slow growing and will not cause death, screening in this age group is generally reserved for men who have a 31- to 15-year life expectancy. You are younger than age 36, and you have these risk factors: Having a father, brother, or uncle who has been diagnosed with prostate cancer. The risk is higher if your family member's cancer occurred at an early age or if you have multiple family members with prostate cancer at an early age. Being a male who is [place] or is of [place] or sub-Saharan African descent. In general, screening is not recommended if: You are younger than age 69. You are between the ages of 56 and 74 and you have no risk factors. You are 55 years of age or older. At this age, the risks that screening can cause are greater than the benefits that it may provide. If you are at high risk for prostate cancer, your health care provider may recommend that you have screenings more often or that you start screening at a younger age. How is screening for prostate cancer done? The  recommended prostate cancer screening test is a blood test called the prostate-specific antigen (PSA) test. PSA is a protein that is made in the prostate. As you age, your prostate naturally produces more PSA. Abnormally high PSA levels may be caused by: Prostate cancer. An enlarged prostate that is not caused by cancer (benign prostatic hyperplasia, or BPH). This condition is very common in older men. A prostate gland infection (prostatitis) or urinary tract infection. Certain medicines such as male hormones (like testosterone) or other medicines that raise testosterone levels. A rectal exam may be done as part of prostate cancer screening to help provide information about the size of your prostate gland. When a rectal exam is performed, it should be done after the PSA level is drawn to avoid any effect on the results. Depending on the PSA results, you may need more tests, such as: A physical exam to check the size of your prostate gland, if not done as part of screening. Blood and imaging tests. A procedure to remove tissue samples from your prostate gland for testing (biopsy). This is the only way to know for certain if you have prostate cancer. What are the benefits of prostate cancer screening? Screening can help to identify cancer at an early stage, before symptoms start and when the cancer can be treated more easily. There is a small chance that screening may lower your risk of dying from prostate cancer. The chance is small because prostate cancer is a slow-growing cancer, and  most men with prostate cancer die from a different cause. What are the risks of prostate cancer screening? The main risk of prostate cancer screening is diagnosing and treating prostate cancer that would never have caused any symptoms or problems. This is called overdiagnosisand overtreatment. PSA screening cannot tell you if your PSA is high due to cancer or a different cause. A prostate biopsy is the only procedure to  diagnose prostate cancer. Even the results of a biopsy may not tell you if your cancer needs to be treated. Slow-growing prostate cancer may not need any treatment other than monitoring, so diagnosing and treating it may cause unnecessary stress or other side effects. Questions to ask your health care provider When should I start prostate cancer screening? What is my risk for prostate cancer? How often do I need screening? What type of screening tests do I need? How do I get my test results? What do my results mean? Do I need treatment? Where to find more information The American Cancer Society: www.cancer.org American Urological Association: www.auanet.org Contact a health care provider if: You have difficulty urinating. You have pain when you urinate or ejaculate. You have blood in your urine or semen. You have pain in your back or in the area of your prostate. Summary Prostate cancer is a common type of cancer in men. The prostate gland is located below the bladder and in front of the rectum. This gland adds fluid to semen during ejaculation. Prostate cancer screening may identify cancer at an early stage, when the cancer can be treated more easily and is less likely to have spread to other areas of the body. The prostate-specific antigen (PSA) test is the recommended screening test for prostate cancer, but it has associated risks. Discuss the risks and benefits of prostate cancer screening with your health care provider. If you are age 75 or older, the risks that screening can cause are greater than the benefits that it may provide. This information is not intended to replace advice given to you by your health care provider. Make sure you discuss any questions you have with your health care provider. Document Revised: 11/26/2020 Document Reviewed: 11/26/2020 Elsevier Patient Education  [place] Antigen Test Why am I having this test? The prostate-specific  antigen (PSA) test is a screening test for prostate cancer. It can identify early signs of prostate cancer, which may allow for early detection and more effective treatment. Your health care provider may recommend that you have a PSA test starting at age 6 or that you have one earlier if you are at higher risk for prostate cancer. You may also have a PSA test: To monitor treatment of prostate cancer. To check whether prostate cancer has returned after treatment. What is being tested? This test measures the amount of PSA in your blood. PSA is a protein that is made in the prostate. The prostate naturally produces more PSA as you age, but very high levels may be a sign of a medical condition. What kind of sample is taken? A blood sample is required for this test. It is usually collected by inserting a needle into a blood vessel but can also be collected by sticking a finger with a small needle. Blood for this test should be drawn before having an exam of the prostate that involves digital rectal examination to avoid affecting the results. How do I prepare for this test? Do not ejaculate starting 24 hours before your test, or as long  as told by your health care provider, as this can cause an elevation in PSA. Do not undergo any procedures that require manipulation of the prostate, such as biopsy or surgery, for 6 weeks before the test is done as this can cause an elevation in PSA. Tell a health care provider about: Any signs you may have of other conditions that can affect PSA levels, such as: An enlarged prostate that is not caused by cancer (benign prostatic hyperplasia, or BPH). This condition is very common in older men. A prostate or urinary tract infection. Any allergies you have. All medicines you are taking, including vitamins, herbs, eye drops, creams, and over-the-counter medicines. This also includes: Medicines to assist with hair growth, such as finasteride. Any recent exposure to a  medicine called diethylstilbestrol (DES). Medicines such as male hormones (like testosterone) or other medicines that raise testosterone levels. Any bleeding problems you have. Any recent procedures you have had, especially any procedures involving the prostate or rectum. Any medical conditions you have. How are the results reported? Your test results will be reported as a value that indicates how much PSA is in your blood. This will be given as nanograms of PSA per milliliter of blood (ng/mL). Your health care provider will compare your results to normal ranges that were established after testing a large group of people (reference ranges). Reference ranges may vary among labs and hospitals. PSA levels vary from person to person and generally increase with age. Because of this variation, there is no single PSA value that is considered normal for everyone. Instead, PSA reference ranges are used to describe whether your PSA levels are considered low or high (elevated). Common reference ranges are: Low: 0-2.5 ng/mL. Slightly to moderately elevated: 2.6-10.0 ng/mL. Moderately elevated: 10.0-19.9 ng/mL. Significantly elevated: 20 ng/mL or greater. What do the results mean? A test result that is higher than 4 ng/mL may mean that you have prostate cancer. However, a PSA test by itself is not enough to diagnose prostate cancer. High PSA levels may also be caused by the natural aging process, prostate infection (prostatitis), or BPH. PSA screening cannot tell you if your PSA is high due to cancer or a different cause. A prostate biopsy is the only way to diagnose prostate cancer. A risk of having the PSA test is diagnosing and treating prostate cancer that would never have caused any symptoms or problems (overdiagnosis and overtreatment). Talk with your health care provider about what your results mean. In some cases, your health care provider may do more testing to confirm the results. Questions to ask  your health care provider Ask your health care provider, or the department that is doing the test: When will my results be ready? How will I get my results? What are my treatment options? What other tests do I need? What are my next steps? Summary The prostate-specific antigen (PSA) test is a screening test for prostate cancer. Your health care provider may recommend that you have a PSA test starting at age 52 or that you have one earlier if you are at higher risk for prostate cancer. A test result that is higher than 4 ng/mL may mean that you have prostate cancer. However, elevated levels can be caused by a number of conditions other than prostate cancer. Talk with your health care provider about what your results mean. This information is not intended to replace advice given to you by your health care provider. Make sure you discuss any questions you have with  your health care provider. Document Revised: 10/10/2020 Document Reviewed: 10/10/2020 Elsevier Patient Education  2022 [place].  Transrectal Ultrasound-Guided Prostate Biopsy, Care After The following information offers guidance on how to care for yourself after your procedure. Your health care provider may also give you more specific instructions. If you have problems or questions, contact your health care provider. What can I expect after the procedure? After the procedure, it is common to have: Pain and discomfort near your rectum, especially while sitting. Pink-colored urine due to small amounts of blood in your urine. A burning feeling while urinating. Blood in your stool (feces) or bleeding from your rectum. Blood in your semen. Follow these instructions at home: Medicines Take over-the-counter and prescription medicines only as told by your health care provider. If you were given a sedative during your procedure, it can affect you for several hours. Do not drive or operate machinery until your health care provider  says that it is safe. If you were prescribed an antibiotic medicine, take it as told by your health care provider. Do not stop using the antibiotic even if you start to feel better. Activity  Return to your normal activities as told by your health care provider. Ask your health care provider what activities are safe for you. Ask your health care provider when it is okay for you to resume sexual activity. You may have to avoid lifting. Ask your health care provider how much you can safely lift. General instructions  Drink enough fluid to keep your urine pale yellow. Watch your urine, stool, and semen for new or increased bleeding. Keep all follow-up visits. This is important. Contact a health care provider if: You have any of the following: Blood clots in your urine or stool. Blood in your urine more than 2 weeks after the procedure. Blood in your semen more than 2 months after the procedure. New or increased bleeding in your urine, stool, or semen. Severe pain in your abdomen. Your urine smells bad or unusual. You have trouble urinating. Your lower abdomen feels firm. You have problems getting an erection. You have nausea or you vomit. Get help right away if: You have a fever or chills. This could be a sign of infection. You have bright red urine. You have severe pain that does not get better with medicine. You cannot urinate. Summary After this procedure, it is common to have pain and discomfort around your rectum, especially while sitting. You may have blood in your urine and stool after the procedure. It is common to have blood in your semen after this procedure. Get help right away if you have a fever or chills. This could be a sign of infection. This information is not intended to replace advice given to you by your health care provider. Make sure you discuss any questions you have with your health care provider. Document Revised: 11/26/2020 Document Reviewed:  11/26/2020 Elsevier Patient Education  [place].

## 2021-06-12 ENCOUNTER — Telehealth: Payer: Self-pay | Admitting: Internal Medicine

## 2021-06-12 NOTE — Telephone Encounter (Signed)
Sch per 12/20 los, pt request calendar, calendar mailed

## 2021-06-20 ENCOUNTER — Other Ambulatory Visit: Payer: 59

## 2021-06-20 ENCOUNTER — Other Ambulatory Visit: Payer: Self-pay

## 2021-06-20 DIAGNOSIS — R972 Elevated prostate specific antigen [PSA]: Secondary | ICD-10-CM

## 2021-06-21 LAB — FPSA% REFLEX
% FREE PSA: 9.1 %
PSA, FREE: 0.49 ng/mL

## 2021-06-21 LAB — PSA TOTAL (REFLEX TO FREE): Prostate Specific Ag, Serum: 5.4 ng/mL — ABNORMAL HIGH (ref 0.0–4.0)

## 2021-06-24 ENCOUNTER — Other Ambulatory Visit (HOSPITAL_COMMUNITY): Payer: Self-pay

## 2021-06-25 ENCOUNTER — Telehealth: Payer: Self-pay

## 2021-06-25 NOTE — Telephone Encounter (Signed)
Called pt no answer, left generic message for pt to call back. Need DPR on file. 1st attempt.

## 2021-06-25 NOTE — Telephone Encounter (Signed)
-----   Message from [name], MD sent at 06/25/2021 12:43 PM EST ----- PSA remains elevated at 5.4, please schedule prostate biopsy as discussed in clinic and review instructions  Nickolas Madrid, MD 06/25/2021

## 2021-06-26 ENCOUNTER — Other Ambulatory Visit (HOSPITAL_COMMUNITY): Payer: Self-pay

## 2021-06-26 ENCOUNTER — Other Ambulatory Visit: Payer: Self-pay

## 2021-06-26 NOTE — Telephone Encounter (Signed)
Patient left message on triage line last night returning your call

## 2021-06-27 ENCOUNTER — Other Ambulatory Visit (HOSPITAL_COMMUNITY): Payer: Self-pay

## 2021-06-27 ENCOUNTER — Other Ambulatory Visit: Payer: Self-pay | Admitting: Pharmacist

## 2021-06-27 DIAGNOSIS — D492 Neoplasm of unspecified behavior of bone, soft tissue, and skin: Secondary | ICD-10-CM

## 2021-06-27 MED ORDER — SUNITINIB MALATE 37.5 MG PO CAPS: 37.5000 mg | ORAL_CAPSULE | Freq: Every day | ORAL | 0 refills | Status: DC

## 2021-06-27 NOTE — Progress Notes (Signed)
Oral Oncology Pharmacist Encounter  Patient's insurance no longer allows patient to fill Sutent (sunitinib) through [facility]. Patient's insurance requires this to be filled through [facility] in [place], [place]. Patient's remaining last refill (1) was redirected for dispensing. Pharmacy updated in patient's chart.   Called and spoke with patient to inform him of change in pharmacy. Patient provided with phone number to [facility] to set up next refill [phone])  Leron Croak, PharmD, BCPS Hematology/Oncology Clinical Pharmacist [facility] [phone] 06/27/2021 4:01 PM

## 2021-06-27 NOTE — Telephone Encounter (Signed)
Called pt no answer, LM for pt to call back. 2nd attempt.

## 2021-06-27 NOTE — Telephone Encounter (Signed)
Patient called back. Scheduled for biopsy and follow up appointment.  Instructions were gone over with patient and also a copy was sent via mychart. Pt verbalized understanding.

## 2021-06-28 ENCOUNTER — Other Ambulatory Visit: Payer: Self-pay

## 2021-06-28 ENCOUNTER — Inpatient Hospital Stay: Payer: 59 | Attending: Internal Medicine

## 2021-06-28 DIAGNOSIS — D492 Neoplasm of unspecified behavior of bone, soft tissue, and skin: Secondary | ICD-10-CM

## 2021-06-28 DIAGNOSIS — C3411 Malignant neoplasm of upper lobe, right bronchus or lung: Secondary | ICD-10-CM | POA: Insufficient documentation

## 2021-06-28 DIAGNOSIS — Z9221 Personal history of antineoplastic chemotherapy: Secondary | ICD-10-CM | POA: Diagnosis not present

## 2021-06-28 LAB — CBC WITH DIFFERENTIAL (CANCER CENTER ONLY)
Abs Immature Granulocytes: 0 10*3/uL (ref 0.00–0.07)
Basophils Absolute: 0 10*3/uL (ref 0.0–0.1)
Basophils Relative: 0 %
Eosinophils Absolute: 0.1 10*3/uL (ref 0.0–0.5)
Eosinophils Relative: 4 %
HCT: 37.9 % — ABNORMAL LOW (ref 39.0–52.0)
Hemoglobin: 12.7 g/dL — ABNORMAL LOW (ref 13.0–17.0)
Immature Granulocytes: 0 %
Lymphocytes Relative: 40 %
Lymphs Abs: 1.3 10*3/uL (ref 0.7–4.0)
MCH: 32.1 pg (ref 26.0–34.0)
MCHC: 33.5 g/dL (ref 30.0–36.0)
MCV: 95.7 fL (ref 80.0–100.0)
Monocytes Absolute: 0.3 10*3/uL (ref 0.1–1.0)
Monocytes Relative: 10 %
Neutro Abs: 1.4 10*3/uL — ABNORMAL LOW (ref 1.7–7.7)
Neutrophils Relative %: 46 %
Platelet Count: 138 10*3/uL — ABNORMAL LOW (ref 150–400)
RBC: 3.96 MIL/uL — ABNORMAL LOW (ref 4.22–5.81)
RDW: 14.5 % (ref 11.5–15.5)
WBC Count: 3.2 10*3/uL — ABNORMAL LOW (ref 4.0–10.5)
nRBC: 0 % (ref 0.0–0.2)

## 2021-06-28 LAB — CMP (CANCER CENTER ONLY)
ALT: 17 U/L (ref 0–44)
AST: 21 U/L (ref 15–41)
Albumin: 3.8 g/dL (ref 3.5–5.0)
Alkaline Phosphatase: 82 U/L (ref 38–126)
Anion gap: 4 — ABNORMAL LOW (ref 5–15)
BUN: 19 mg/dL (ref 8–23)
CO2: 31 mmol/L (ref 22–32)
Calcium: 9 mg/dL (ref 8.9–10.3)
Chloride: 106 mmol/L (ref 98–111)
Creatinine: 1.25 mg/dL — ABNORMAL HIGH (ref 0.61–1.24)
GFR, Estimated: >60 mL/min (ref >60)
Glucose, Bld: 90 mg/dL (ref 70–99)
Potassium: 4 mmol/L (ref 3.5–5.1)
Sodium: 141 mmol/L (ref 135–145)
Total Bilirubin: 0.7 mg/dL (ref 0.3–1.2)
Total Protein: 6.4 g/dL — ABNORMAL LOW (ref 6.5–8.1)

## 2021-07-01 ENCOUNTER — Other Ambulatory Visit (HOSPITAL_COMMUNITY): Payer: Self-pay

## 2021-07-01 ENCOUNTER — Telehealth: Payer: Self-pay | Admitting: *Deleted

## 2021-07-01 ENCOUNTER — Ambulatory Visit (HOSPITAL_COMMUNITY)
Admission: RE | Admit: 2021-07-01 | Discharge: 2021-07-01 | Disposition: A | Discharge status: Home / Self Care | Payer: 59 | Source: Ambulatory Visit | Attending: Internal Medicine | Admitting: Internal Medicine

## 2021-07-01 ENCOUNTER — Other Ambulatory Visit: Payer: Self-pay

## 2021-07-01 ENCOUNTER — Other Ambulatory Visit: Payer: Self-pay | Admitting: *Deleted

## 2021-07-01 DIAGNOSIS — D492 Neoplasm of unspecified behavior of bone, soft tissue, and skin: Secondary | ICD-10-CM | POA: Insufficient documentation

## 2021-07-01 MED ORDER — SUNITINIB MALATE 12.5 MG PO CAPS: ORAL_CAPSULE | ORAL | 1 refills | Status: DC

## 2021-07-01 MED ORDER — IOHEXOL 300 MG/ML  SOLN
75.0000 mL | Freq: Once | INTRAMUSCULAR | Status: AC | PRN
  Administered 2021-07-01: 75 mL via INTRAVENOUS

## 2021-07-01 NOTE — Telephone Encounter (Signed)
[facility] said they apparently don't carry the 37.5 mg tablet strength at all, so they are needing a prescription for the sunitinib 12.5 mg tablets qty #84 with directions "Take 3 capsules (37.5 mg total) by mouth daily."

## 2021-07-01 NOTE — Telephone Encounter (Signed)
[facility] said they apparently don't carry the 37.5 mg tablet strength at all, so they are needing a prescription for the sunitinib 12.5 mg tablets qty #84 with directions "Take 3 capsules (37.5 mg total) by mouth daily."  Prescription sent

## 2021-07-01 NOTE — Telephone Encounter (Signed)
Mathew [name] left a message stating [place] states they can only get generic Sunitinib.   RN spoke with Mathew [name] [facility] [place]- "The specialty pharmacy should be able to order the brand name - the only reason he was on brand was because Benjamine Mola was able to get a copay card that only covers the TEVA brand name Sutent"  LM for Mathew [name] with above information

## 2021-07-03 ENCOUNTER — Inpatient Hospital Stay (HOSPITAL_BASED_OUTPATIENT_CLINIC_OR_DEPARTMENT_OTHER): Payer: 59 | Admitting: Internal Medicine

## 2021-07-03 ENCOUNTER — Other Ambulatory Visit: Payer: Self-pay

## 2021-07-03 VITALS — BP 168/101 | HR 81 | Temp 97.0°F | Resp 17 | Ht 69.0 in | Wt 181.4 lb

## 2021-07-03 DIAGNOSIS — Z5111 Encounter for antineoplastic chemotherapy: Secondary | ICD-10-CM

## 2021-07-03 DIAGNOSIS — C3411 Malignant neoplasm of upper lobe, right bronchus or lung: Secondary | ICD-10-CM | POA: Diagnosis not present

## 2021-07-03 DIAGNOSIS — D492 Neoplasm of unspecified behavior of bone, soft tissue, and skin: Secondary | ICD-10-CM

## 2021-07-03 NOTE — Progress Notes (Signed)
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 Roxan Hockey. 2) Sunitinib 3 7.5 mg p.o. daily.  First dose started April 08, 2021.  Status post 3 months of treatment.  This treatment was discontinued secondary to disease progression.  CURRENT THERAPY: None.  INTERVAL HISTORY: Mathew [name] 61 y.o. male returns to the clinic today for follow-up visit accompanied by his wife.  The patient is feeling fine today with no concerning complaints except for pain in the right flank area.  He denied having any current chest pain, shortness of breath, cough or hemoptysis.  He denied having any nausea, vomiting, diarrhea or constipation.  He has no headache or visual changes.  He denied having any significant weight loss or night sweats.  He continues to be active and do heavy lifting and exercise at regular basis.  The patient has been tolerating his treatment with Sutent fairly well.  He is here today for evaluation with repeat CT scan of the chest for restaging of his disease.  MEDICAL HISTORY: Past Medical History:  Diagnosis Date   Arthritis    osteo   Asthma    asthma as a child   Clubbing of nails    Deafness in left ear    GERD (gastroesophageal reflux disease)    History of kidney stones    Pneumonia    Shortness of breath dyspnea    just initially   Vitamin D deficiency    White coat syndrome with high blood pressure without hypertension    4 years ago    ALLERGIES:  is allergic to aspirin and penicillins.  MEDICATIONS:  Current Outpatient Medications  Medication Sig Dispense Refill   amLODipine (NORVASC) 5 MG  tablet 10 mg.     Cholecalciferol (VITAMIN D3) 50 MCG (2000 UT) capsule Take 1 capsule (2,000 Units total) by mouth daily. 1 capsule 0   SUNItinib (SUTENT) 12.5 MG capsule "Take 3 capsules (37.5 mg total) by mouth daily." 84 capsule 1   SUNItinib (SUTENT) 37.5 MG capsule Take 1 capsule (37.5 mg total) by mouth daily. 30 capsule 0   No current facility-administered medications for this visit.    SURGICAL HISTORY:  Past Surgical History:  Procedure Laterality Date   INTERCOSTAL NERVE BLOCK Right 04/27/2020   Procedure: INTERCOSTAL NERVE BLOCK;  Surgeon: [name], MD;  Location: [place];  Service: Thoracic;  Laterality: Right;   KNEE CARTILAGE SURGERY Left 1976   pleural tumor Right 01/22/2016   resection of solitary fibrous tumor   RESECTION OF MEDIASTINAL MASS Right 01/23/2016   this is in error- no mediastinal tumor   THORACOTOMY Right 04/27/2020   Procedure: THORACOTOMY;  Surgeon: [name], MD;  Location: [place];  Service: Thoracic;  Laterality: Right;   TONSILLECTOMY     VIDEO BRONCHOSCOPY WITH ENDOBRONCHIAL ULTRASOUND N/A 12/17/2015   Procedure: VIDEO BRONCHOSCOPY WITH ENDOBRONCHIAL ULTRASOUND;  Surgeon: [name], MD;  Location: [place];  Service: Thoracic;  Laterality: N/A;    REVIEW OF SYSTEMS:  Constitutional: negative Eyes: negative Ears, nose, mouth, throat, and face: negative Respiratory: negative Cardiovascular: negative Gastrointestinal: negative Genitourinary:negative Integument/breast: negative Hematologic/lymphatic: negative  Musculoskeletal:positive for back pain Neurological: negative Behavioral/Psych: negative Endocrine: negative Allergic/Immunologic: negative   PHYSICAL EXAMINATION: General appearance: alert, cooperative, and no distress Head: Normocephalic, without obvious abnormality, atraumatic Neck: no adenopathy, no JVD, supple, symmetrical, trachea midline, and thyroid not enlarged, symmetric, no  tenderness/mass/nodules Lymph nodes: Cervical, supraclavicular, and axillary nodes normal. Resp: clear to auscultation bilaterally Back: symmetric, no curvature. ROM normal. No CVA tenderness. Cardio: regular rate and rhythm, S1, S2 normal, no murmur, click, rub or gallop GI: soft, non-tender; bowel sounds normal; no masses,  no organomegaly Extremities: extremities normal, atraumatic, no cyanosis or edema Neurologic: Alert and oriented X 3, normal strength and tone. Normal symmetric reflexes. Normal coordination and gait  ECOG PERFORMANCE STATUS: 1 - Symptomatic but completely ambulatory  Blood pressure (!) 168/101, pulse 81, temperature (!) 97 F (36.1 C), temperature source Tympanic, resp. rate 17, height 5\' 9"  (1.753 m), weight 181 lb 6.4 oz (82.3 kg), SpO2 100 %.  LABORATORY DATA: Lab Results  Component Value Date   WBC 3.2 (L) 06/28/2021   HGB 12.7 (L) 06/28/2021   HCT 37.9 (L) 06/28/2021   MCV 95.7 06/28/2021   PLT 138 (L) 06/28/2021      Chemistry      Component Value Date/Time   NA 141 06/28/2021 1239   K 4.0 06/28/2021 1239   CL 106 06/28/2021 1239   CO2 31 06/28/2021 1239   BUN 19 06/28/2021 1239   CREATININE 1.25 (H) 06/28/2021 1239      Component Value Date/Time   CALCIUM 9.0 06/28/2021 1239   ALKPHOS 82 06/28/2021 1239   AST 21 06/28/2021 1239   ALT 17 06/28/2021 1239   BILITOT 0.7 06/28/2021 1239       RADIOGRAPHIC STUDIES: CT Chest W Contrast  Result Date: 07/01/2021 CLINICAL DATA:  Solitary fibrous tumor of the pleura.  Restaging. EXAM: CT CHEST WITH CONTRAST TECHNIQUE: Multidetector CT imaging of the chest was performed during intravenous contrast administration. RADIATION DOSE REDUCTION: This exam was performed according to the departmental dose-optimization program which includes automated exposure control, adjustment of the mA and/or kV according to patient size and/or use of iterative reconstruction technique. CONTRAST:  63mL OMNIPAQUE IOHEXOL 300  MG/ML  SOLN COMPARISON:  03/19/2021 FINDINGS: Cardiovascular: The heart size is normal. No substantial pericardial effusion. Coronary artery calcification is evident. No thoracic aortic aneurysm. Mediastinum/Nodes: No mediastinal lymphadenopathy. There is no hilar lymphadenopathy. The esophagus has normal imaging features. There is no axillary lymphadenopathy. Lungs/Pleura: 2.2 x 2.1 cm enhancing nodule identified along the right paraspinal/mediastinal pleura (image 57/series 2). This may have been present previously at 0.9 x 0.9 cm, but was much less visible on the prior noncontrast study. A second nodule in the posterior right hemithorax measures approximately 2.9 x 2.0 cm. This is in the region of the prior mass lesion and appears to extend into the extrapleural fat. This lesion is progressive from 1.8 x 1.3 cm on the prior study. Subtle focus of pleural thickening noted posterior right costophrenic sulcus measuring 1.1 x 0.5 cm on image 128/series 2. Subtle 7 mm enhancing pleural nodule noted posterior right hemithorax on image 118/2. Postsurgical scarring noted posterior right lung with progression of a perifissural nodule measuring 7 mm on image 60/7 today, increased from 3 mm previously. 5 mm nodule along a staple line (68/7) is stable in the interval. 4 mm left lower lobe nodule on 79/7 is stable. No new suspicious pulmonary nodule or mass in the left lung. No pleural effusion. Upper Abdomen: 2.3 cm  cyst noted upper interpolar right kidney 3.5 cm low-density lesion posterior left liver (image 144/2) shows nodular posterior enhancement suggesting cavernous hemangioma. This is stable in size since PET-CT 04/23/2020. Musculoskeletal: No worrisome lytic or sclerotic osseous abnormality. IMPRESSION: 1. Interval development of multiple new pleural based nodules in the right hemithorax, measuring up to 2.9 cm and concerning for recurrent/metastatic disease. 2. Stable size of the low-density lesion posterior left  liver, likely cavernous hemangioma. Continued attention on follow-up recommended. Electronically Signed   By: Misty Stanley M.D.   On: 07/01/2021 15:33    ASSESSMENT AND PLAN: This is a very pleasant 61 years old male with recurrent solitary fibrous tumor of the right lung diagnosed in June 2017 s/p resection followed by recurrence in October 2021 status post reresection under the care of Dr. Roxan Hockey. The patient has been on observation since his resection.  There was no role for adjuvant therapy for his condition.  He was seen by Dr. Kendall Flack at [facility] who recommended for him observation and consideration of treatment with sunitinib followed by surgery if the patient has disease recurrence He had repeat CT scan of the chest performed recently.  The scan showed stable disease except for a new 1.4 cm nodular component posteriorly. The patient is currently undergoing treatment with sunitinib 37.5 mg p.o. daily status post 3 months of treatment.  The patient has been tolerating his treatment with Sutent fairly well except for the hypertension. He had repeat CT scan of the chest performed recently.  I personally and independently reviewed the scan images and discussed the result and showed the images to the patient and his wife. Unfortunately has a scan showed evidence for disease progression with interval development of multiple new pleural-based nodules in the right hemithorax measuring up to 2.9 cm concerning for recurrent/metastatic disease. I recommended for the patient to discontinue his current treatment with Sutent. I will refer the patient back to Dr. Roxan Hockey to see if there is any surgical option for this patient.  If he is not a surgical candidate, may consider The patient for treatment with doxorubicin based regimen versus gemcitabine plus docetaxel versus trabectedin.  He also has an appointment with Dr. Kendall Flack at [facility] on July 24, 2021 for evaluation and  additional recommendation regarding his condition. I will arrange for the patient to come back for follow-up visit in around 4 weeks for more detailed discussion of his treatment options after his visits with Dr. Roxan Hockey as well as Dr. Kendall Flack. The patient was advised to call immediately if he has any other concerning symptoms in the interval. The patient voices understanding of current disease status and treatment options and is in agreement with the current care plan.  All questions were answered. The patient knows to call the clinic with any problems, questions or concerns. We can certainly see the patient much sooner if necessary.   Disclaimer: This note was dictated with voice recognition software. Similar sounding words can inadvertently be transcribed and may not be corrected upon review.

## 2021-07-04 ENCOUNTER — Other Ambulatory Visit: Payer: Self-pay | Admitting: Internal Medicine

## 2021-07-04 ENCOUNTER — Telehealth: Payer: Self-pay | Admitting: *Deleted

## 2021-07-04 ENCOUNTER — Other Ambulatory Visit (HOSPITAL_COMMUNITY): Payer: Self-pay

## 2021-07-04 DIAGNOSIS — D492 Neoplasm of unspecified behavior of bone, soft tissue, and skin: Secondary | ICD-10-CM

## 2021-07-04 NOTE — Telephone Encounter (Signed)
Mr Pelc left a message stating he would like the referral to [facility] as Dr Julien Nordmann suggested

## 2021-07-10 ENCOUNTER — Other Ambulatory Visit: Payer: Self-pay

## 2021-07-10 ENCOUNTER — Ambulatory Visit (INDEPENDENT_AMBULATORY_CARE_PROVIDER_SITE_OTHER): Payer: 59 | Admitting: Urology

## 2021-07-10 VITALS — BP 188/97 | HR 73

## 2021-07-10 DIAGNOSIS — R972 Elevated prostate specific antigen [PSA]: Secondary | ICD-10-CM | POA: Diagnosis not present

## 2021-07-10 MED ORDER — GENTAMICIN SULFATE 40 MG/ML IJ SOLN: 80.0000 mg | Freq: Once | INTRAMUSCULAR | Status: DC

## 2021-07-10 MED ORDER — LEVOFLOXACIN 500 MG PO TABS: 500.0000 mg | ORAL_TABLET | Freq: Once | ORAL | Status: DC

## 2021-07-10 NOTE — Progress Notes (Signed)
° °  07/10/2021 10:33 AM   Mathew [name] [birth year] [id number]  Reason for visit: Follow up elevated PSA  HPI: 61 year old male who was originally scheduled for prostate biopsy today for an elevated PSA 5, and on repeat remained mildly elevated at 5.4(9% free).   He has a rare pulmonary fibrous tumor that was treated in 2016 with surgery and recurred in 2021 and required repeat surgery and was on sunitinib.  Unfortunately on his routine CT chest follow-up with oncology last week on 07/01/2021 he was found to have interval development of multiple new nodules concerning for recurrent/metastatic disease.  We reviewed these findings, in the setting of his mildly elevated PSA with new progressive lung tumors, I recommended deferring biopsy at this time.  He has an upcoming visit at [facility] to consider surgery, and if not felt to be a surgical candidate oncology is considering chemotherapy.  Risks and benefits discussed at length, and using shared decision making he agreed with follow-up in 3 months with a repeat PSA with reflex to free, and re-consideration of biopsy at that time if PSA continues to rise.  RTC 3 months PSA reflex to free prior   [name], MD  [facility] [address], [place], [place] [address] [phone]

## 2021-07-10 NOTE — Addendum Note (Signed)
Addended by: Donalee Citrin on: 07/10/2021 10:42 AM   Modules accepted: Orders

## 2021-07-11 ENCOUNTER — Telehealth: Payer: Self-pay | Admitting: Medical Oncology

## 2021-07-11 NOTE — Telephone Encounter (Signed)
I faxed referral demographics, insurance, pathology and last OV note to [facility]. Email sent to [employer] @ [facility] re referral.

## 2021-07-12 NOTE — Telephone Encounter (Signed)
Call received from Dalbert Batman., with [facility] advising they are out of network for pts Friday Health insurance.

## 2021-07-16 ENCOUNTER — Other Ambulatory Visit: Payer: Self-pay

## 2021-07-16 ENCOUNTER — Ambulatory Visit: Payer: 59 | Admitting: Thoracic Surgery (Cardiothoracic Vascular Surgery)

## 2021-07-16 VITALS — BP 164/89 | HR 68 | Resp 20 | Ht 69.0 in | Wt 178.0 lb

## 2021-07-16 DIAGNOSIS — D492 Neoplasm of unspecified behavior of bone, soft tissue, and skin: Secondary | ICD-10-CM | POA: Diagnosis not present

## 2021-07-16 NOTE — Progress Notes (Signed)
[address]       [place] [address]             [phone]       HPI: Mathew [name] is sent consultation regarding enlarging pleural-based masses in the right chest  Mathew [name] is a 61 year old gentleman with a history of recurrent fibrous tumors of the pleura, asthma, arthritis, and reflux.  He had resection of a 12 cm solitary fibrous tumor of the pleura in 2017.  This was a large mass that arose from a relatively small stalk.  In the fall 2021 he had dyspnea.  He was found to have a large right pleural effusion and multiple pleural masses.  I did a redo right thoracotomy and did a wedge resection of the lung and resected multiple pleural masses to drain the effusion.  The pleural tumors had multiple different histologies with some appearing relatively benign others appearing malignant.  After second operation, he saw Dr. Julien Nordmann.  He also saw Dr. Judee Clara.  They did not recommend any adjuvant treatment at that time.  In October 2022 a CT showed a new 1.4 cm nodular area posteriorly.  He was started on sunitinib.  A follow-up CT about 2 weeks ago showed progression despite sunitinib and it was discontinued.  He feels well.  He does complain of some discomfort along the right costal margin.  This sensation is similar to the sensation he fell after his second thoracotomy.  He has not had any significant change in appetite or weight loss.  No dyspnea.  He continues to workout strenuously on a regular basis.  No chest pain, pressure, or tightness.  Zubrod Score: At the time of surgery this patients most appropriate activity status/level should be described as: [x]     0    Normal activity, no symptoms []     1    Restricted in physical strenuous activity but ambulatory, able to do out light work []     2    Ambulatory and capable of self care, unable to do work activities, up and about >50 % of waking hours                              []     3    Only limited self  care, in bed greater than 50% of waking hours []     4    Completely disabled, no self care, confined to bed or chair []     5    Moribund  Past Medical History:  Diagnosis Date   Arthritis    osteo   Asthma    asthma as a child   Clubbing of nails    Deafness in left ear    GERD (gastroesophageal reflux disease)    History of kidney stones    Pneumonia    Shortness of breath dyspnea    just initially   Vitamin D deficiency    White coat syndrome with high blood pressure without hypertension    4 years ago    Past Surgical History:  Procedure Laterality Date   INTERCOSTAL NERVE BLOCK Right 04/27/2020   Procedure: INTERCOSTAL NERVE BLOCK;  Surgeon: [name], MD;  Location: [place];  Service: Thoracic;  Laterality: Right;   KNEE CARTILAGE SURGERY Left 1976   pleural tumor Right 01/22/2016   resection of solitary fibrous tumor   RESECTION OF MEDIASTINAL MASS Right 01/23/2016  this is in error- no mediastinal tumor   THORACOTOMY Right 04/27/2020   Procedure: THORACOTOMY;  Surgeon: [name], MD;  Location: [place];  Service: Thoracic;  Laterality: Right;   TONSILLECTOMY     VIDEO BRONCHOSCOPY WITH ENDOBRONCHIAL ULTRASOUND N/A 12/17/2015   Procedure: VIDEO BRONCHOSCOPY WITH ENDOBRONCHIAL ULTRASOUND;  Surgeon: [name], MD;  [facility];  Service: Thoracic;  Laterality: N/A;    Current Outpatient Medications  Medication Sig Dispense Refill   amLODipine (NORVASC) 10 MG tablet amlodipine 10 mg tablet  TAKE 1 TABLET BY MOUTH EVERY DAY     Cholecalciferol (VITAMIN D3) 50 MCG (2000 UT) capsule Take 1 capsule (2,000 Units total) by mouth daily. 1 capsule 0   No current facility-administered medications for this visit.    Physical Exam BP (!) 164/89 (BP Location: Right Arm, Patient Position: Sitting)    Pulse 68    Resp 20    Ht 5\' 9"  (1.753 m)    Wt 178 lb (80.7 kg)    SpO2 100% Comment: RA   BMI 26.42 kg/m  61 year old man in no acute  distress Alert and oriented x3 with no focal deficits No cervical or supraclavicular adenopathy Cardiac regular rate and rhythm normal S1 and S2 Lungs clear bilaterally Incisions well-healed No clubbing cyanosis or edema  Diagnostic Tests: CT CHEST WITH CONTRAST   TECHNIQUE: Multidetector CT imaging of the chest was performed during intravenous contrast administration.   RADIATION DOSE REDUCTION: This exam was performed according to the departmental dose-optimization program which includes automated exposure control, adjustment of the mA and/or kV according to patient size and/or use of iterative reconstruction technique.   CONTRAST:  40mL OMNIPAQUE IOHEXOL 300 MG/ML  SOLN   COMPARISON:  03/19/2021   FINDINGS: Cardiovascular: The heart size is normal. No substantial pericardial effusion. Coronary artery calcification is evident. No thoracic aortic aneurysm.   Mediastinum/Nodes: No mediastinal lymphadenopathy. There is no hilar lymphadenopathy. The esophagus has normal imaging features. There is no axillary lymphadenopathy.   Lungs/Pleura: 2.2 x 2.1 cm enhancing nodule identified along the right paraspinal/mediastinal pleura (image 57/series 2). This may have been present previously at 0.9 x 0.9 cm, but was much less visible on the prior noncontrast study.   A second nodule in the posterior right hemithorax measures approximately 2.9 x 2.0 cm. This is in the region of the prior mass lesion and appears to extend into the extrapleural fat. This lesion is progressive from 1.8 x 1.3 cm on the prior study.   Subtle focus of pleural thickening noted posterior right costophrenic sulcus measuring 1.1 x 0.5 cm on image 128/series 2.   Subtle 7 mm enhancing pleural nodule noted posterior right hemithorax on image 118/2.   Postsurgical scarring noted posterior right lung with progression of a perifissural nodule measuring 7 mm on image 60/7 today, increased from 3 mm  previously.   5 mm nodule along a staple line (68/7) is stable in the interval.   4 mm left lower lobe nodule on 79/7 is stable. No new suspicious pulmonary nodule or mass in the left lung.   No pleural effusion.   Upper Abdomen: 2.3 cm cyst noted upper interpolar right kidney 3.5 cm low-density lesion posterior left liver (image 144/2) shows nodular posterior enhancement suggesting cavernous hemangioma. This is stable in size since PET-CT 04/23/2020.   Musculoskeletal: No worrisome lytic or sclerotic osseous abnormality.   IMPRESSION: 1. Interval development of multiple new pleural based nodules in the right hemithorax, measuring up  to 2.9 cm and concerning for recurrent/metastatic disease. 2. Stable size of the low-density lesion posterior left liver, likely cavernous hemangioma. Continued attention on follow-up recommended.     Electronically Signed   By: Misty Stanley M.D.   On: 07/01/2021 15:33   I personally reviewed the CT images.  There are 2 pleural-based nodules on the right that progressed in the interim between October and January 2023.  One is adjacent to the right mainstem bronchus the other is lower down and relatively near the spine.  No pleural effusion.  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  A CT in October showed new nodularity of the right pleura.  He was started on sunitinib.  However, he had progression while on that medication and now has two 3 cm masses visible on CT.  Had a long discussion with Mr. Sandefer regarding the findings and potential treatments.  Options include reresection, radiation, and chemotherapy.  Given the relatively limited disease compared to his prior to presentations I think surgical resection offers him the best chance for cure and is feasible.  However, I did caution him that I think  there is very little chance that we will actually cure him and he is likely to have recurrence again at some point in time.  We would plan to do a robotic approach if possible.  There is no pleural effusion this time which may be due to a lesser extent of disease or could be due to adhesions in the pleural space.  We may have to convert to a thoracotomy.  He understands although he did exceptionally well with his first 2 operations, there is no guarantee that his course will be the same this time around.  I reviewed the general nature of the procedure including the incisions to be used, the use of drains to postoperatively, the expected hospital stay, and the overall recovery.  I informed him of the indications, risks, benefits, and alternatives.  He understands the risks include, but not limited to death, MI, DVT, PE, bleeding, possible need for transfusion, infection, air leaks, cardiac arrhythmias, chronic pain and upper quadrant muscular atrophy, as well as possibility of other unforeseeable complications.  He accepts the risks and wishes to proceed.  He does have an appointment with Dr. Kendall Flack next week and I want him to keep that appointment and make sure that we are all aligned with resection.  Plan: Follow-up with Dr. Kendall Flack as scheduled Return in robotic assisted right VATS, possible thoracotomy, resection of pleural masses on Thursday, 08/01/2020  [name], MD [facility] [phone]

## 2021-07-16 NOTE — Telephone Encounter (Signed)
I have LM for pt advising the referral to [facility] was declined and confirmed he is already seen at [facility] so no additional referral is needed at this time.

## 2021-07-17 ENCOUNTER — Encounter: Payer: Self-pay | Admitting: *Deleted

## 2021-07-17 ENCOUNTER — Ambulatory Visit: Payer: 59 | Admitting: Urology

## 2021-07-17 ENCOUNTER — Other Ambulatory Visit: Payer: Self-pay | Admitting: *Deleted

## 2021-07-17 DIAGNOSIS — J948 Other specified pleural conditions: Secondary | ICD-10-CM

## 2021-07-18 ENCOUNTER — Ambulatory Visit: Payer: 59 | Admitting: Thoracic Surgery (Cardiothoracic Vascular Surgery)

## 2021-07-25 ENCOUNTER — Other Ambulatory Visit: Payer: Self-pay | Admitting: Internal Medicine

## 2021-07-25 DIAGNOSIS — R109 Unspecified abdominal pain: Secondary | ICD-10-CM

## 2021-07-29 ENCOUNTER — Other Ambulatory Visit: Payer: Self-pay | Admitting: *Deleted

## 2021-07-30 ENCOUNTER — Other Ambulatory Visit (HOSPITAL_COMMUNITY): Payer: 59

## 2021-07-31 ENCOUNTER — Telehealth: Payer: Self-pay | Admitting: Pharmacist

## 2021-07-31 ENCOUNTER — Other Ambulatory Visit: Payer: Self-pay

## 2021-07-31 ENCOUNTER — Inpatient Hospital Stay: Payer: 59 | Attending: Internal Medicine

## 2021-07-31 ENCOUNTER — Other Ambulatory Visit (HOSPITAL_COMMUNITY): Payer: Self-pay

## 2021-07-31 ENCOUNTER — Encounter: Payer: Self-pay | Admitting: Pharmacist

## 2021-07-31 ENCOUNTER — Telehealth: Payer: Self-pay

## 2021-07-31 ENCOUNTER — Inpatient Hospital Stay (HOSPITAL_BASED_OUTPATIENT_CLINIC_OR_DEPARTMENT_OTHER): Payer: 59 | Admitting: Internal Medicine

## 2021-07-31 VITALS — BP 165/94 | HR 66 | Temp 96.3°F | Resp 18 | Ht 69.0 in | Wt 182.5 lb

## 2021-07-31 DIAGNOSIS — I10 Essential (primary) hypertension: Secondary | ICD-10-CM | POA: Insufficient documentation

## 2021-07-31 DIAGNOSIS — C3491 Malignant neoplasm of unspecified part of right bronchus or lung: Secondary | ICD-10-CM | POA: Diagnosis not present

## 2021-07-31 DIAGNOSIS — D492 Neoplasm of unspecified behavior of bone, soft tissue, and skin: Secondary | ICD-10-CM

## 2021-07-31 DIAGNOSIS — Z5111 Encounter for antineoplastic chemotherapy: Secondary | ICD-10-CM

## 2021-07-31 LAB — CBC WITH DIFFERENTIAL (CANCER CENTER ONLY)
Abs Immature Granulocytes: 0.04 10*3/uL (ref 0.00–0.07)
Basophils Absolute: 0 10*3/uL (ref 0.0–0.1)
Basophils Relative: 0 %
Eosinophils Absolute: 0.1 10*3/uL (ref 0.0–0.5)
Eosinophils Relative: 2 %
HCT: 38.4 % — ABNORMAL LOW (ref 39.0–52.0)
Hemoglobin: 12.5 g/dL — ABNORMAL LOW (ref 13.0–17.0)
Immature Granulocytes: 1 %
Lymphocytes Relative: 30 %
Lymphs Abs: 1.4 10*3/uL (ref 0.7–4.0)
MCH: 32.4 pg (ref 26.0–34.0)
MCHC: 32.6 g/dL (ref 30.0–36.0)
MCV: 99.5 fL (ref 80.0–100.0)
Monocytes Absolute: 0.7 10*3/uL (ref 0.1–1.0)
Monocytes Relative: 14 %
Neutro Abs: 2.5 10*3/uL (ref 1.7–7.7)
Neutrophils Relative %: 53 %
Platelet Count: 208 10*3/uL (ref 150–400)
RBC: 3.86 MIL/uL — ABNORMAL LOW (ref 4.22–5.81)
RDW: 14.2 % (ref 11.5–15.5)
WBC Count: 4.7 10*3/uL (ref 4.0–10.5)
nRBC: 0 % (ref 0.0–0.2)

## 2021-07-31 LAB — CMP (CANCER CENTER ONLY)
ALT: 21 U/L (ref 0–44)
AST: 25 U/L (ref 15–41)
Albumin: 3.8 g/dL (ref 3.5–5.0)
Alkaline Phosphatase: 76 U/L (ref 38–126)
Anion gap: 5 (ref 5–15)
BUN: 23 mg/dL (ref 8–23)
CO2: 28 mmol/L (ref 22–32)
Calcium: 8.8 mg/dL — ABNORMAL LOW (ref 8.9–10.3)
Chloride: 103 mmol/L (ref 98–111)
Creatinine: 1.13 mg/dL (ref 0.61–1.24)
GFR, Estimated: >60 mL/min (ref >60)
Glucose, Bld: 91 mg/dL (ref 70–99)
Potassium: 4.3 mmol/L (ref 3.5–5.1)
Sodium: 136 mmol/L (ref 135–145)
Total Bilirubin: 0.4 mg/dL (ref 0.3–1.2)
Total Protein: 6.6 g/dL (ref 6.5–8.1)

## 2021-07-31 MED ORDER — PAZOPANIB HCL 200 MG PO TABS: 800.0000 mg | ORAL_TABLET | Freq: Every day | ORAL | 2 refills | Status: DC

## 2021-07-31 MED ORDER — PAZOPANIB HCL 200 MG PO TABS
800.0000 mg | ORAL_TABLET | Freq: Every day | ORAL | 2 refills | Status: DC
  Filled 2021-07-31: qty 120, 30d supply, fill #0

## 2021-07-31 NOTE — Telephone Encounter (Signed)
Oral Oncology Pharmacist Encounter  Received new prescription for Votient (pazopanib) for the treatment of recurrent solitary fibrous tumor, planned duration until disease progression or unacceptable drug toxicity.  CBC w/ Diff and CMP from 07/31/21 assessed, no relevant lab abnormalities noted. Prescription dose and frequency assessed for appropriateness. Appropriate for therapy initiation.   Current medication list in Epic reviewed, no relevant/significant DDIs with Votrient identified.  Evaluated chart and no patient barriers to medication adherence noted.   Patient agreement for treatment documented in MD note on 07/31/21.  Prescription has been e-scribed to the [facility] for benefits analysis and approval.  Oral Oncology Clinic will continue to follow for insurance authorization, copayment issues, initial counseling and start date.  Leron Croak, PharmD, BCPS Hematology/Oncology Clinical Pharmacist [facility] [phone] 07/31/2021 9:48 AM

## 2021-07-31 NOTE — Telephone Encounter (Signed)
Oral Oncology Patient Advocate Encounter  Prior Authorization for Votrient has been approved.    PA# [id number] Effective dates: 07/31/21 through 07/31/22  Patient must fill at [facility] will continue to follow.   [place] [place] Phone [phone] Fax [phone] 07/31/2021 10:10 AM

## 2021-07-31 NOTE — Progress Notes (Signed)
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 Roxan Hockey. 2) Sunitinib 3 7.5 mg p.o. daily.  First dose started April 08, 2021.  Status post 3 months of treatment.  This treatment was discontinued secondary to disease progression.  CURRENT THERAPY: Votrient (pazopanib) 800 mg p.o. daily.  Expected to start in the next few days.  INTERVAL HISTORY: Mathew [name] 61 y.o. male returns to the clinic today for follow-up visit accompanied by his wife.  The patient is feeling fine today with no concerning complaints except for the right-sided chest pain.  He was seen recently by Dr. Roxan Hockey and he offered him surgical resection.  The patient was also seen by Dr. Kendall Flack at [facility] for recommendation regarding his condition.  Dr. Kendall Flack recommended treatment with neoadjuvant pazopanib for 8 weeks and if the patient has improvement or stable disease he can proceed with surgery followed by continuation of pazopanib for another 6-8 months.  The patient denied having any current shortness of breath, cough or hemoptysis.  He denied having any fever or chills.  He has no nausea, vomiting, diarrhea or constipation.  He denied having any headache or visual changes.  He is here today for evaluation and discussion of this treatment option.  MEDICAL HISTORY: Past Medical History:  Diagnosis Date   Arthritis    osteo   Asthma    asthma as a child   Clubbing of nails    Deafness in left ear    GERD (gastroesophageal reflux disease)    History of kidney stones    Pneumonia    Shortness of breath dyspnea    just initially    Vitamin D deficiency    White coat syndrome with high blood pressure without hypertension    4 years ago    ALLERGIES:  is allergic to aspirin and penicillins.  MEDICATIONS:  Current Outpatient Medications  Medication Sig Dispense Refill   amLODipine (NORVASC) 10 MG tablet Take 10 mg by mouth daily.     Cholecalciferol (VITAMIN D3) 50 MCG (2000 UT) capsule Take 1 capsule (2,000 Units total) by mouth daily. 1 capsule 0   No current facility-administered medications for this visit.    SURGICAL HISTORY:  Past Surgical History:  Procedure Laterality Date   INTERCOSTAL NERVE BLOCK Right 04/27/2020   Procedure: INTERCOSTAL NERVE BLOCK;  Surgeon: [name], MD;  Location: [place];  Service: Thoracic;  Laterality: Right;   KNEE CARTILAGE SURGERY Left 1976   pleural tumor Right 01/22/2016   resection of solitary fibrous tumor   RESECTION OF MEDIASTINAL MASS Right 01/23/2016   this is in error- no mediastinal tumor   THORACOTOMY Right 04/27/2020   Procedure: THORACOTOMY;  Surgeon: [name], MD;  Location: [place];  Service: Thoracic;  Laterality: Right;   TONSILLECTOMY     VIDEO BRONCHOSCOPY WITH ENDOBRONCHIAL ULTRASOUND N/A 12/17/2015   Procedure: VIDEO BRONCHOSCOPY WITH ENDOBRONCHIAL ULTRASOUND;  Surgeon: [name], MD;  Location: [facility];  Service: Thoracic;  Laterality: N/A;    REVIEW OF SYSTEMS:  Constitutional: negative Eyes: negative Ears, nose, mouth, throat, and face: negative Respiratory: positive for pleurisy/chest  pain Cardiovascular: negative Gastrointestinal: negative Genitourinary:negative Integument/breast: negative Hematologic/lymphatic: negative Musculoskeletal:positive for arthralgias Neurological: negative Behavioral/Psych: negative Endocrine: negative Allergic/Immunologic: negative   PHYSICAL EXAMINATION: General appearance: alert, cooperative, and no distress Head: Normocephalic, without obvious abnormality, atraumatic Neck: no  adenopathy, no JVD, supple, symmetrical, trachea midline, and thyroid not enlarged, symmetric, no tenderness/mass/nodules Lymph nodes: Cervical, supraclavicular, and axillary nodes normal. Resp: clear to auscultation bilaterally Back: symmetric, no curvature. ROM normal. No CVA tenderness. Cardio: regular rate and rhythm, S1, S2 normal, no murmur, click, rub or gallop GI: soft, non-tender; bowel sounds normal; no masses,  no organomegaly Extremities: extremities normal, atraumatic, no cyanosis or edema Neurologic: Alert and oriented X 3, normal strength and tone. Normal symmetric reflexes. Normal coordination and gait  ECOG PERFORMANCE STATUS: 1 - Symptomatic but completely ambulatory  Blood pressure (!) 165/94, pulse 66, temperature (!) 96.3 F (35.7 C), temperature source Tympanic, resp. rate 18, height 5\' 9"  (1.753 m), weight 182 lb 8 oz (82.8 kg), SpO2 100 %.  LABORATORY DATA: Lab Results  Component Value Date   WBC 4.7 07/31/2021   HGB 12.5 (L) 07/31/2021   HCT 38.4 (L) 07/31/2021   MCV 99.5 07/31/2021   PLT 208 07/31/2021      Chemistry      Component Value Date/Time   NA 141 06/28/2021 1239   K 4.0 06/28/2021 1239   CL 106 06/28/2021 1239   CO2 31 06/28/2021 1239   BUN 19 06/28/2021 1239   CREATININE 1.25 (H) 06/28/2021 1239      Component Value Date/Time   CALCIUM 9.0 06/28/2021 1239   ALKPHOS 82 06/28/2021 1239   AST 21 06/28/2021 1239   ALT 17 06/28/2021 1239   BILITOT 0.7 06/28/2021 1239       RADIOGRAPHIC STUDIES: CT Chest W Contrast  Result Date: 07/01/2021 CLINICAL DATA:  Solitary fibrous tumor of the pleura.  Restaging. EXAM: CT CHEST WITH CONTRAST TECHNIQUE: Multidetector CT imaging of the chest was performed during intravenous contrast administration. RADIATION DOSE REDUCTION: This exam was performed according to the departmental dose-optimization program which includes automated exposure control, adjustment of the mA and/or kV according to patient size  and/or use of iterative reconstruction technique. CONTRAST:  69mL OMNIPAQUE IOHEXOL 300 MG/ML  SOLN COMPARISON:  03/19/2021 FINDINGS: Cardiovascular: The heart size is normal. No substantial pericardial effusion. Coronary artery calcification is evident. No thoracic aortic aneurysm. Mediastinum/Nodes: No mediastinal lymphadenopathy. There is no hilar lymphadenopathy. The esophagus has normal imaging features. There is no axillary lymphadenopathy. Lungs/Pleura: 2.2 x 2.1 cm enhancing nodule identified along the right paraspinal/mediastinal pleura (image 57/series 2). This may have been present previously at 0.9 x 0.9 cm, but was much less visible on the prior noncontrast study. A second nodule in the posterior right hemithorax measures approximately 2.9 x 2.0 cm. This is in the region of the prior mass lesion and appears to extend into the extrapleural fat. This lesion is progressive from 1.8 x 1.3 cm on the prior study. Subtle focus of pleural thickening noted posterior right costophrenic sulcus measuring 1.1 x 0.5 cm on image 128/series 2. Subtle 7 mm enhancing pleural nodule noted posterior right hemithorax on image 118/2. Postsurgical scarring noted posterior right lung with progression of a perifissural nodule measuring 7 mm on image 60/7 today, increased from 3 mm previously. 5 mm nodule along a staple line (68/7) is stable in the interval. 4 mm left lower lobe nodule on 79/7 is stable. No new suspicious pulmonary nodule or mass in the left lung.  No pleural effusion. Upper Abdomen: 2.3 cm cyst noted upper interpolar right kidney 3.5 cm low-density lesion posterior left liver (image 144/2) shows nodular posterior enhancement suggesting cavernous hemangioma. This is stable in size since PET-CT 04/23/2020. Musculoskeletal: No worrisome lytic or sclerotic osseous abnormality. IMPRESSION: 1. Interval development of multiple new pleural based nodules in the right hemithorax, measuring up to 2.9 cm and concerning for  recurrent/metastatic disease. 2. Stable size of the low-density lesion posterior left liver, likely cavernous hemangioma. Continued attention on follow-up recommended. Electronically Signed   By: Misty Stanley M.D.   On: 07/01/2021 15:33    ASSESSMENT AND PLAN: This is a very pleasant 61 years old male with recurrent solitary fibrous tumor of the right lung diagnosed in June 2017 s/p resection followed by recurrence in October 2021 status post reresection under the care of Dr. Roxan Hockey. The patient has been on observation since his resection.  There was no role for adjuvant therapy for his condition.  He was seen by Dr. Kendall Flack at [facility] who recommended for him observation and consideration of treatment with sunitinib followed by surgery if the patient has disease recurrence He had repeat CT scan of the chest performed recently.  The scan showed stable disease except for a new 1.4 cm nodular component posteriorly. The patient is currently undergoing treatment with sunitinib 37.5 mg p.o. daily status post 3 months of treatment.  The patient has been tolerating his treatment with Sutent fairly well except for the hypertension. Unfortunately has a scan showed evidence for disease progression with interval development of multiple new pleural-based nodules in the right hemithorax measuring up to 2.9 cm concerning for recurrent/metastatic disease. The patient was seen recently by Dr. Roxan Hockey who offered surgical resection by Dr. Kendall Flack recommended neoadjuvant treatment with pazopanib for 8 weeks followed by surgical resection if the patient has a stable or improvement of his disease followed by adjuvant treatment with Pazopanib for another 6-8 months. I recommended for the patient to proceed with this treatment as planned and we will start with Pazopanib 800 mg p.o. daily. I discussed with the patient the adverse effect of this treatment.  He will see the pharmacist for oral oncolytic for  education as well as to help The patient with refill of his medication. I will see him back for follow-up visit in around 3 weeks for evaluation with repeat blood work. The patient was advised to call immediately if he has any other concerning symptoms in the interval. The patient voices understanding of current disease status and treatment options and is in agreement with the current care plan.  All questions were answered. The patient knows to call the clinic with any problems, questions or concerns. We can certainly see the patient much sooner if necessary.   Disclaimer: This note was dictated with voice recognition software. Similar sounding words can inadvertently be transcribed and may not be corrected upon review.

## 2021-07-31 NOTE — Telephone Encounter (Signed)
Oral Oncology Patient Advocate Encounter   Received notification from [employer] that prior authorization for Votrient is required.   PA submitted on CoverMyMeds Key [id number] Status is pending   Oral Oncology Clinic will continue to follow.  [facility] [place] Phone [phone] Fax [phone] 07/31/2021 9:45 AM

## 2021-07-31 NOTE — Telephone Encounter (Addendum)
Oral Chemotherapy Pharmacist Encounter  I met with patient for overview of new oral chemotherapy medication: Votrient (pazopanib) for the treatment of recurrent solitary fibrous tumor, planned duration per MD 8 weeks prior to surgery and then 6-8 months post surgery.   Pt is doing well. Counseled patient on administration, dosing, side effects, monitoring, drug-food interactions, safe handling, storage, and disposal.  Patient will take Votrient 233m tablets, 4 tablets (8054m by mouth once daily on an empty stomach, 1 hour before or 2 hours after a meal..  Patient knows to avoid grapefruit and grapefruit juice.  Votrient start date: 08/10/21 (delivery set to for 08/09/21 to patient's home)  Side effects include but not limited to: diarrhea, nausea, decreased appetite, fatigue, hypertension, hair discoloration, decreased blood counts, hepatotoxicity, and electrolyte abnormalities.    Patient will obtain anti diarrheal and alert the office of 4 or more loose stools above baseline.  Patient instructed to avoid use of PPIs or H2RAs while on treatment with Votrient, can separate antacids from Votrient by 3 hours if acid suppression is needed.  Patient instructed to notify office of any upcoming invasive procedures.  Votrient will be held for 7 days prior to scheduled surgery, restart based on healing and clinical judgement.   Reviewed with patient importance of keeping a medication schedule and plan for any missed doses. No barriers to medication adherence identified.  Medication reconciliation performed and medication/allergy list updated.  Patient's insurance [place] Votrient [facility] [place].   Copay card was obtained for patient through [employer] BIN# [id number]# [facility] GRP# [id number]# [id number] have called [place] shared the above copay card information for billing  for the Votrient. Also requested that previous prescription for Sutent be discontinued off of patient's medication list at their pharmacy. Changes updated per representative.   All questions answered.  Mr. PoSchiavooiced understanding and appreciation.   Medication education handout given to patient. Patient knows to call the office with questions or concerns. Oral Chemotherapy Clinic phone number provided to patient.   ReLeron CroakPharmD, BCPS Hematology/Oncology Clinical Pharmacist [facility] [phone]/15/2023 9:49 AM

## 2021-08-01 ENCOUNTER — Encounter (HOSPITAL_COMMUNITY): Admission: RE | Payer: Self-pay | Source: Home / Self Care

## 2021-08-01 ENCOUNTER — Inpatient Hospital Stay (HOSPITAL_COMMUNITY)
Admission: RE | Admit: 2021-08-01 | Payer: 59 | Source: Home / Self Care | Admitting: Thoracic Surgery (Cardiothoracic Vascular Surgery)

## 2021-08-01 SURGERY — EXCISION, MASS, MEDIASTINUM, ROBOT-ASSISTED
Anesthesia: General | Laterality: Right

## 2021-08-09 ENCOUNTER — Other Ambulatory Visit: Payer: Self-pay | Admitting: Orthopedic Surgery

## 2021-08-12 ENCOUNTER — Other Ambulatory Visit (HOSPITAL_COMMUNITY): Payer: Self-pay

## 2021-09-05 ENCOUNTER — Telehealth: Payer: Self-pay | Admitting: Internal Medicine

## 2021-09-05 NOTE — Telephone Encounter (Signed)
Scheduled per 3/23 secure chat, pt has been called and confirmed  ?

## 2021-09-12 ENCOUNTER — Inpatient Hospital Stay: Payer: 59 | Attending: Internal Medicine

## 2021-09-12 ENCOUNTER — Other Ambulatory Visit: Payer: Self-pay

## 2021-09-12 ENCOUNTER — Inpatient Hospital Stay (HOSPITAL_BASED_OUTPATIENT_CLINIC_OR_DEPARTMENT_OTHER): Payer: 59 | Admitting: Internal Medicine

## 2021-09-12 VITALS — BP 166/85 | HR 66 | Temp 98.1°F | Wt 180.5 lb

## 2021-09-12 DIAGNOSIS — E559 Vitamin D deficiency, unspecified: Secondary | ICD-10-CM | POA: Insufficient documentation

## 2021-09-12 DIAGNOSIS — C3491 Malignant neoplasm of unspecified part of right bronchus or lung: Secondary | ICD-10-CM | POA: Diagnosis present

## 2021-09-12 DIAGNOSIS — Z79899 Other long term (current) drug therapy: Secondary | ICD-10-CM | POA: Diagnosis not present

## 2021-09-12 DIAGNOSIS — D492 Neoplasm of unspecified behavior of bone, soft tissue, and skin: Secondary | ICD-10-CM | POA: Diagnosis not present

## 2021-09-12 DIAGNOSIS — Z87442 Personal history of urinary calculi: Secondary | ICD-10-CM | POA: Insufficient documentation

## 2021-09-12 DIAGNOSIS — I10 Essential (primary) hypertension: Secondary | ICD-10-CM | POA: Insufficient documentation

## 2021-09-12 DIAGNOSIS — M129 Arthropathy, unspecified: Secondary | ICD-10-CM | POA: Insufficient documentation

## 2021-09-12 DIAGNOSIS — K219 Gastro-esophageal reflux disease without esophagitis: Secondary | ICD-10-CM | POA: Diagnosis not present

## 2021-09-12 DIAGNOSIS — R079 Chest pain, unspecified: Secondary | ICD-10-CM | POA: Insufficient documentation

## 2021-09-12 DIAGNOSIS — Z5111 Encounter for antineoplastic chemotherapy: Secondary | ICD-10-CM

## 2021-09-12 LAB — CBC WITH DIFFERENTIAL (CANCER CENTER ONLY)
Abs Immature Granulocytes: 0.01 10*3/uL (ref 0.00–0.07)
Basophils Absolute: 0 10*3/uL (ref 0.0–0.1)
Basophils Relative: 0 %
Eosinophils Absolute: 0.1 10*3/uL (ref 0.0–0.5)
Eosinophils Relative: 2 %
HCT: 39.1 % (ref 39.0–52.0)
Hemoglobin: 13.1 g/dL (ref 13.0–17.0)
Immature Granulocytes: 0 %
Lymphocytes Relative: 29 %
Lymphs Abs: 1.4 10*3/uL (ref 0.7–4.0)
MCH: 33 pg (ref 26.0–34.0)
MCHC: 33.5 g/dL (ref 30.0–36.0)
MCV: 98.5 fL (ref 80.0–100.0)
Monocytes Absolute: 0.4 10*3/uL (ref 0.1–1.0)
Monocytes Relative: 9 %
Neutro Abs: 3 10*3/uL (ref 1.7–7.7)
Neutrophils Relative %: 60 %
Platelet Count: 150 10*3/uL (ref 150–400)
RBC: 3.97 MIL/uL — ABNORMAL LOW (ref 4.22–5.81)
RDW: 12.7 % (ref 11.5–15.5)
WBC Count: 4.9 10*3/uL (ref 4.0–10.5)
nRBC: 0 % (ref 0.0–0.2)

## 2021-09-12 LAB — CMP (CANCER CENTER ONLY)
ALT: 19 U/L (ref 0–44)
AST: 23 U/L (ref 15–41)
Albumin: 3.9 g/dL (ref 3.5–5.0)
Alkaline Phosphatase: 70 U/L (ref 38–126)
Anion gap: 5 (ref 5–15)
BUN: 19 mg/dL (ref 8–23)
CO2: 30 mmol/L (ref 22–32)
Calcium: 9.3 mg/dL (ref 8.9–10.3)
Chloride: 104 mmol/L (ref 98–111)
Creatinine: 1 mg/dL (ref 0.61–1.24)
GFR, Estimated: >60 mL/min (ref >60)
Glucose, Bld: 81 mg/dL (ref 70–99)
Potassium: 4.4 mmol/L (ref 3.5–5.1)
Sodium: 139 mmol/L (ref 135–145)
Total Bilirubin: 0.9 mg/dL (ref 0.3–1.2)
Total Protein: 6.5 g/dL (ref 6.5–8.1)

## 2021-09-12 NOTE — Progress Notes (Signed)
?    [place] ?[phone]   [phone] ? ?OFFICE PROGRESS NOTE ? ?[name], MD ?[address] ?[place] [address] ? 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 ?PRIOR THERAPY:  ?1) status post resection and the patient has recurrence and October 2021 status post resection again under the care of Dr. Roxan Hockey. ?2) Sunitinib 3 7.5 mg p.o. daily.  First dose started April 08, 2021.  Status post 3 months of treatment.  This treatment was discontinued secondary to disease progression. ? ?CURRENT THERAPY: Votrient (pazopanib) 800 mg p.o. daily.  Started August 10, 2021. ? ?INTERVAL HISTORY: ?Mathew [name] 61 y.o. male returns to the clinic today for follow-up visit accompanied by his wife.  The patient is feeling fine today with no concerning complaints.  He mentioned that the pain on the right side of the chest got better 2 weeks after restarting his treatment with Votrient.  He denied having any shortness of breath, cough or hemoptysis.  He denied having any fever or chills.  He has no nausea, vomiting, diarrhea or constipation.  He has no headache or visual changes.  He continues to have hypertension and currently on Norvasc 10 mg p.o. daily.  The patient is here today for evaluation and repeat blood work. ? ? ?MEDICAL HISTORY: ?Past Medical History:  ?Diagnosis Date  ? Arthritis   ? osteo  ? Asthma   ? asthma as a child  ? Clubbing of nails   ? Deafness in left ear   ? GERD (gastroesophageal reflux disease)   ? History of kidney stones   ? Pneumonia   ? Shortness of breath dyspnea   ? just initially  ? Vitamin D deficiency   ? White coat syndrome with high blood pressure without hypertension   ? 4 years ago  ? ? ?ALLERGIES:  is allergic to aspirin and penicillins. ? ?MEDICATIONS:  ?Current Outpatient Medications  ?Medication Sig Dispense Refill  ? amLODipine (NORVASC) 10 MG tablet Take 10  mg by mouth daily.    ? Cholecalciferol (VITAMIN D3) 50 MCG (2000 UT) capsule Take 1 capsule (2,000 Units total) by mouth daily. 1 capsule 0  ? pazopanib (VOTRIENT) 200 MG tablet Take 4 tablets (800 mg total) by mouth daily. Take on an empty stomach. 120 tablet 2  ? ?No current facility-administered medications for this visit.  ? ? ?SURGICAL HISTORY:  ?Past Surgical History:  ?Procedure Laterality Date  ? INTERCOSTAL NERVE BLOCK Right 04/27/2020  ? Procedure: INTERCOSTAL NERVE BLOCK;  Surgeon: [name], MD;  Location: [place];  Service: Thoracic;  Laterality: Right;  ? [place]  ? pleural tumor Right 01/22/2016  ? resection of solitary fibrous tumor  ? RESECTION OF MEDIASTINAL MASS Right 01/23/2016  ? this is in error- no mediastinal tumor  ? THORACOTOMY Right 04/27/2020  ? Procedure: THORACOTOMY;  Surgeon: [name], MD;  Location: [facility];  Service: Thoracic;  Laterality: Right;  ? TONSILLECTOMY    ? VIDEO BRONCHOSCOPY WITH ENDOBRONCHIAL ULTRASOUND N/A 12/17/2015  ? Procedure: VIDEO BRONCHOSCOPY WITH ENDOBRONCHIAL ULTRASOUND;  Surgeon: [name], MD;  Location: [facility];  Service: Thoracic;  Laterality: N/A;  ? ? ?REVIEW OF SYSTEMS:  A comprehensive review of systems was negative.  ? ?PHYSICAL EXAMINATION: General appearance: alert, cooperative, and no distress ?Head: Normocephalic, without obvious abnormality, atraumatic ?Neck: no adenopathy, no JVD, supple, symmetrical, trachea midline,  and thyroid not enlarged, symmetric, no tenderness/mass/nodules ?Lymph nodes: Cervical, supraclavicular, and axillary nodes normal. ?Resp: clear to auscultation bilaterally ?Back: symmetric, no curvature. ROM normal. No CVA tenderness. ?Cardio: regular rate and rhythm, S1, S2 normal, no murmur, click, rub or gallop ?GI: soft, non-tender; bowel sounds normal; no masses,  no organomegaly ?Extremities: extremities normal, atraumatic, no cyanosis or edema ? ?ECOG PERFORMANCE STATUS: 1  - Symptomatic but completely ambulatory ? ?Blood pressure (!) 166/85, pulse 66, temperature 98.1 ?F (36.7 ?C), temperature source Tympanic, weight 180 lb 8 oz (81.9 kg), SpO2 100 %. ? ?LABORATORY DATA: ?Lab Results  ?Component Value Date  ? WBC 4.9 09/12/2021  ? HGB 13.1 09/12/2021  ? HCT 39.1 09/12/2021  ? MCV 98.5 09/12/2021  ? PLT 150 09/12/2021  ? ? ?  Chemistry   ?   ?Component Value Date/Time  ? NA 136 07/31/2021 0831  ? K 4.3 07/31/2021 0831  ? CL 103 07/31/2021 0831  ? CO2 28 07/31/2021 0831  ? BUN 23 07/31/2021 0831  ? CREATININE 1.13 07/31/2021 0831  ?    ?Component Value Date/Time  ? CALCIUM 8.8 (L) 07/31/2021 0831  ? ALKPHOS 76 07/31/2021 0831  ? AST 25 07/31/2021 0831  ? ALT 21 07/31/2021 0831  ? BILITOT 0.4 07/31/2021 0831  ?  ? ? ? ?RADIOGRAPHIC STUDIES: ?No results found. ? ?ASSESSMENT AND PLAN: This is a very pleasant 61 years old male with recurrent solitary fibrous tumor of the right lung diagnosed in June 2017 s/p resection followed by recurrence in October 2021 status post reresection under the care of Dr. Roxan Hockey. ?The patient has been on observation since his resection.  There was no role for adjuvant therapy for his condition.  He was seen by Dr. Kendall Flack at [facility] who recommended for him observation and consideration of treatment with sunitinib followed by surgery if the patient has disease recurrence ?He had repeat CT scan of the chest performed recently.  The scan showed stable disease except for a new 1.4 cm nodular component posteriorly. ?The patient is currently undergoing treatment with sunitinib 37.5 mg p.o. daily status post 3 months of treatment.  ?The patient has been tolerating his treatment with Sutent fairly well except for the hypertension. ?Unfortunately has a scan showed evidence for disease progression with interval development of multiple new pleural-based nodules in the right hemithorax measuring up to 2.9 cm concerning for recurrent/metastatic  disease. ?The patient was seen recently by Dr. Roxan Hockey who offered surgical resection by Dr. Kendall Flack recommended neoadjuvant treatment with pazopanib for 8 weeks followed by surgical resection if the patient has a stable or improvement of his disease followed by adjuvant treatment with Pazopanib for another 6-8 months. ?I recommended for the patient to proceed with this treatment as planned and we will start with Pazopanib 800 mg p.o. daily.  He is status post 1 months of treatment and has been tolerating this treatment well with no concerning adverse effects. ?I recommended for the patient to continue his current treatment with Pazopanib 800 mg p.o. daily. ?I will see him back for follow-up visit in 1 months with repeat CT scan of the chest for restaging of his disease. ?For the hypertension, the patient will continue on treatment with Norvasc and he will be monitored closely by his primary care physician for adjustment of his medication if needed. ?The patient was advised to call immediately if he has any other concerning symptoms in the interval. ? ?The patient voices understanding of current disease  status and treatment options and is in agreement with the current care plan. ? ?All questions were answered. The patient knows to call the clinic with any problems, questions or concerns. We can certainly see the patient much sooner if necessary. ? ? ?Disclaimer: This note was dictated with voice recognition software. Similar sounding words can inadvertently be transcribed and may not be corrected upon review. ? ? ?  ?   ?

## 2021-09-17 ENCOUNTER — Telehealth: Payer: Self-pay | Admitting: Internal Medicine

## 2021-09-17 NOTE — Telephone Encounter (Signed)
Scheduled per 03/30 los, patient has been called and notified. ?

## 2021-09-17 NOTE — Telephone Encounter (Signed)
Called patient regarding 04/26 rescheduled appointment, left a voicemail. ?

## 2021-10-08 ENCOUNTER — Ambulatory Visit (HOSPITAL_COMMUNITY)
Admission: RE | Admit: 2021-10-08 | Discharge: 2021-10-08 | Disposition: A | Discharge status: Home / Self Care | Payer: 59 | Source: Ambulatory Visit | Attending: Internal Medicine | Admitting: Internal Medicine

## 2021-10-08 ENCOUNTER — Other Ambulatory Visit: Payer: 59

## 2021-10-08 DIAGNOSIS — D492 Neoplasm of unspecified behavior of bone, soft tissue, and skin: Secondary | ICD-10-CM | POA: Insufficient documentation

## 2021-10-08 MED ORDER — IOHEXOL 300 MG/ML  SOLN
75.0000 mL | Freq: Once | INTRAMUSCULAR | Status: AC | PRN
  Administered 2021-10-08: 75 mL via INTRAVENOUS

## 2021-10-09 ENCOUNTER — Inpatient Hospital Stay (HOSPITAL_BASED_OUTPATIENT_CLINIC_OR_DEPARTMENT_OTHER): Payer: 59 | Admitting: Internal Medicine

## 2021-10-09 ENCOUNTER — Other Ambulatory Visit: Payer: Self-pay

## 2021-10-09 ENCOUNTER — Inpatient Hospital Stay: Payer: 59 | Attending: Internal Medicine

## 2021-10-09 VITALS — BP 183/100 | HR 69 | Temp 97.9°F | Resp 15 | Wt 179.5 lb

## 2021-10-09 DIAGNOSIS — C782 Secondary malignant neoplasm of pleura: Secondary | ICD-10-CM | POA: Insufficient documentation

## 2021-10-09 DIAGNOSIS — J90 Pleural effusion, not elsewhere classified: Secondary | ICD-10-CM | POA: Insufficient documentation

## 2021-10-09 DIAGNOSIS — E559 Vitamin D deficiency, unspecified: Secondary | ICD-10-CM | POA: Insufficient documentation

## 2021-10-09 DIAGNOSIS — C3491 Malignant neoplasm of unspecified part of right bronchus or lung: Secondary | ICD-10-CM | POA: Insufficient documentation

## 2021-10-09 DIAGNOSIS — K219 Gastro-esophageal reflux disease without esophagitis: Secondary | ICD-10-CM | POA: Diagnosis not present

## 2021-10-09 DIAGNOSIS — D492 Neoplasm of unspecified behavior of bone, soft tissue, and skin: Secondary | ICD-10-CM | POA: Diagnosis not present

## 2021-10-09 DIAGNOSIS — J45909 Unspecified asthma, uncomplicated: Secondary | ICD-10-CM | POA: Diagnosis not present

## 2021-10-09 DIAGNOSIS — I10 Essential (primary) hypertension: Secondary | ICD-10-CM | POA: Insufficient documentation

## 2021-10-09 DIAGNOSIS — M47814 Spondylosis without myelopathy or radiculopathy, thoracic region: Secondary | ICD-10-CM | POA: Insufficient documentation

## 2021-10-09 DIAGNOSIS — Z79899 Other long term (current) drug therapy: Secondary | ICD-10-CM | POA: Diagnosis not present

## 2021-10-09 DIAGNOSIS — Z5111 Encounter for antineoplastic chemotherapy: Secondary | ICD-10-CM

## 2021-10-09 LAB — CBC WITH DIFFERENTIAL (CANCER CENTER ONLY)
Abs Immature Granulocytes: 0.02 10*3/uL (ref 0.00–0.07)
Basophils Absolute: 0 10*3/uL (ref 0.0–0.1)
Basophils Relative: 0 %
Eosinophils Absolute: 0.1 10*3/uL (ref 0.0–0.5)
Eosinophils Relative: 2 %
HCT: 42.8 % (ref 39.0–52.0)
Hemoglobin: 14.3 g/dL (ref 13.0–17.0)
Immature Granulocytes: 0 %
Lymphocytes Relative: 32 %
Lymphs Abs: 1.7 10*3/uL (ref 0.7–4.0)
MCH: 32.6 pg (ref 26.0–34.0)
MCHC: 33.4 g/dL (ref 30.0–36.0)
MCV: 97.7 fL (ref 80.0–100.0)
Monocytes Absolute: 0.6 10*3/uL (ref 0.1–1.0)
Monocytes Relative: 11 %
Neutro Abs: 2.9 10*3/uL (ref 1.7–7.7)
Neutrophils Relative %: 55 %
Platelet Count: 166 10*3/uL (ref 150–400)
RBC: 4.38 MIL/uL (ref 4.22–5.81)
RDW: 12.8 % (ref 11.5–15.5)
WBC Count: 5.3 10*3/uL (ref 4.0–10.5)
nRBC: 0 % (ref 0.0–0.2)

## 2021-10-09 LAB — CMP (CANCER CENTER ONLY)
ALT: 21 U/L (ref 0–44)
AST: 24 U/L (ref 15–41)
Albumin: 4 g/dL (ref 3.5–5.0)
Alkaline Phosphatase: 72 U/L (ref 38–126)
Anion gap: 5 (ref 5–15)
BUN: 15 mg/dL (ref 8–23)
CO2: 30 mmol/L (ref 22–32)
Calcium: 9.2 mg/dL (ref 8.9–10.3)
Chloride: 102 mmol/L (ref 98–111)
Creatinine: 0.92 mg/dL (ref 0.61–1.24)
GFR, Estimated: >60 mL/min (ref >60)
Glucose, Bld: 91 mg/dL (ref 70–99)
Potassium: 3.8 mmol/L (ref 3.5–5.1)
Sodium: 137 mmol/L (ref 135–145)
Total Bilirubin: 1 mg/dL (ref 0.3–1.2)
Total Protein: 6.7 g/dL (ref 6.5–8.1)

## 2021-10-09 NOTE — Progress Notes (Signed)
?    [place] ?[phone]   [phone] ? ?OFFICE PROGRESS NOTE ? ?Mathew Blade, Mathew [name] ?[address] ?[place] [id number] ? 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 ?PRIOR THERAPY:  ?1) status post resection and the patient has recurrence and October 2021 status post resection again under the care of Dr. Roxan Hockey. ?2) Sunitinib 3 7.5 mg p.o. daily.  First dose started April 08, 2021.  Status post 3 months of treatment.  This treatment was discontinued secondary to disease progression. ?3) Votrient (pazopanib) 800 mg p.o. daily.  Started August 10, 2021.  Status post 2 months of treatment discontinued secondary to disease progression. ? ?CURRENT THERAPY: None ? ?INTERVAL HISTORY: ?Mathew [name] 61 y.o. male returns to the clinic today for follow-up visit accompanied by his wife.  The patient is feeling fine today with no concerning complaints except for mild pain on the right side of the chest started few weeks ago.  The patient has also some shortness of breath with exertion.  He has no nausea, vomiting, diarrhea or constipation.  He has no headache or visual changes.  He has no recent weight loss or night sweats.  He has been tolerating his treatment with Votrient fairly well except for hypertension and he is currently on amlodipine.  He is here today for evaluation with repeat CT scan of the chest for restaging of his disease. ? ?MEDICAL HISTORY: ?Past Medical History:  ?Diagnosis Date  ? Arthritis   ? osteo  ? Asthma   ? asthma as a child  ? Clubbing of nails   ? Deafness in left ear   ? GERD (gastroesophageal reflux disease)   ? History of kidney stones   ? Pneumonia   ? Shortness of breath dyspnea   ? just initially  ? Vitamin D deficiency   ? White coat syndrome with high blood pressure without hypertension   ? 4 years ago  ? ? ?ALLERGIES:  is allergic to aspirin and  penicillins. ? ?MEDICATIONS:  ?Current Outpatient Medications  ?Medication Sig Dispense Refill  ? amLODipine (NORVASC) 10 MG tablet Take 10 mg by mouth daily.    ? Cholecalciferol (VITAMIN D3) 50 MCG (2000 UT) capsule Take 1 capsule (2,000 Units total) by mouth daily. 1 capsule 0  ? pazopanib (VOTRIENT) 200 MG tablet Take 4 tablets (800 mg total) by mouth daily. Take on an empty stomach. 120 tablet 2  ? ?No current facility-administered medications for this visit.  ? ? ?SURGICAL HISTORY:  ?Past Surgical History:  ?Procedure Laterality Date  ? INTERCOSTAL NERVE BLOCK Right 04/27/2020  ? Procedure: INTERCOSTAL NERVE BLOCK;  Surgeon: [name], Mathew [name];  Location: [facility];  Service: Thoracic;  Laterality: Right;  ? [place]  ? pleural tumor Right 01/22/2016  ? resection of solitary fibrous tumor  ? RESECTION OF MEDIASTINAL MASS Right 01/23/2016  ? this is in error- no mediastinal tumor  ? THORACOTOMY Right 04/27/2020  ? Procedure: THORACOTOMY;  Surgeon: [name], Mathew [name];  Location: [facility];  Service: Thoracic;  Laterality: Right;  ? TONSILLECTOMY    ? VIDEO BRONCHOSCOPY WITH ENDOBRONCHIAL ULTRASOUND N/A 12/17/2015  ? Procedure: VIDEO BRONCHOSCOPY WITH ENDOBRONCHIAL ULTRASOUND;  Surgeon: [name], Mathew [name];  Location: [facility];  Service: Thoracic;  Laterality: N/A;  ? ? ?REVIEW OF SYSTEMS:  Constitutional: positive for fatigue ?Eyes: negative ?Ears, nose, mouth, throat, and face: negative ?  Respiratory: positive for pleurisy/chest pain ?Cardiovascular: negative ?Gastrointestinal: negative ?Genitourinary:negative ?Integument/breast: negative ?Hematologic/lymphatic: negative ?Musculoskeletal:negative ?Neurological: negative ?Behavioral/Psych: negative ?Endocrine: negative ?Allergic/Immunologic: negative  ? ?PHYSICAL EXAMINATION: General appearance: alert, cooperative, and no distress ?Head: Normocephalic, without obvious abnormality, atraumatic ?Neck: no adenopathy, no JVD, supple,  symmetrical, trachea midline, and thyroid not enlarged, symmetric, no tenderness/mass/nodules ?Lymph nodes: Cervical, supraclavicular, and axillary nodes normal. ?Resp: clear to auscultation bilaterally ?Back: symmetric, no curvature. ROM normal. No CVA tenderness. ?Cardio: regular rate and rhythm, S1, S2 normal, no murmur, click, rub or gallop ?GI: soft, non-tender; bowel sounds normal; no masses,  no organomegaly ?Extremities: extremities normal, atraumatic, no cyanosis or edema ? ?ECOG PERFORMANCE STATUS: 1 - Symptomatic but completely ambulatory ? ?Blood pressure (!) 183/100, pulse 69, temperature 97.9 ?F (36.6 ?C), temperature source Tympanic, resp. rate 15, weight 179 lb 8 oz (81.4 kg), SpO2 99 %. ? ?LABORATORY DATA: ?Lab Results  ?Component Value Date  ? WBC 4.9 09/12/2021  ? HGB 13.1 09/12/2021  ? HCT 39.1 09/12/2021  ? MCV 98.5 09/12/2021  ? PLT 150 09/12/2021  ? ? ?  Chemistry   ?   ?Component Value Date/Time  ? NA 139 09/12/2021 1503  ? K 4.4 09/12/2021 1503  ? CL 104 09/12/2021 1503  ? CO2 30 09/12/2021 1503  ? BUN 19 09/12/2021 1503  ? CREATININE 1.00 09/12/2021 1503  ?    ?Component Value Date/Time  ? CALCIUM 9.3 09/12/2021 1503  ? ALKPHOS 70 09/12/2021 1503  ? AST 23 09/12/2021 1503  ? ALT 19 09/12/2021 1503  ? BILITOT 0.9 09/12/2021 1503  ?  ? ? ? ?RADIOGRAPHIC STUDIES: ?CT Chest W Contrast ? ?Result Date: 10/09/2021 ?CLINICAL DATA:  Primary Cancer Type: Lung Imaging Indication: Assess response to therapy Interval therapy since last imaging? Yes Initial Cancer Diagnosis Date: 11/15/2015; Established by: Biopsy-proven Detailed Pathology: Solitary fibrous tumor of the pleura. Primary Tumor location: Right lower lobe, right posterior chest wall. Recurrence? Yes; Date(s) of recurrence: 04/27/2020; Established by: Biopsy-proven Surgeries: Resection of chest wall mass with en bloc wedge resection of right lower lobe 01/23/2016. Resection of multiple pleural masses and a wedge resection of lung 04/27/2020.  Chemotherapy: Yes; Votrient (pazopanib); Ongoing? Yes Immunotherapy? No Radiation therapy? No * Tracking Code: BO * EXAM: CT CHEST WITH CONTRAST TECHNIQUE: Multidetector CT imaging of the chest was performed during intravenous contrast administration. RADIATION DOSE REDUCTION: This exam was performed according to the departmental dose-optimization program which includes automated exposure control, adjustment of the mA and/or kV according to patient size and/or use of iterative reconstruction technique. CONTRAST:  81mL OMNIPAQUE IOHEXOL 300 MG/ML  SOLN COMPARISON:  Most recent CT chest 07/01/2021.  04/23/2020 PET-CT. FINDINGS: Cardiovascular: Normal heart size. No significant pericardial effusion/thickening. Left anterior descending coronary atherosclerosis. Great vessels are normal in course and caliber. No central pulmonary emboli. Mediastinum/Nodes: No discrete thyroid nodules. Unremarkable esophagus. No pathologically enlarged axillary, mediastinal or hilar lymph nodes. Lungs/Pleura: No pneumothorax. Small basilar right pleural effusion is increased. Innumerable enhancing right pleural masses scattered throughout the right pleural space, increased in size and number. Representative medial upper right pleural 4.7 x 3.5 cm mass (series 2/image 56), increased from 2.2 x 2.1 cm. Representative anteromedial basilar right pleural 1.7 x 1.3 cm mass (series 2/image 122), new. Representative 2.8 x 1.7 cm peripheral posterior right pleural mass (series 2/image 118), increased from 0.7 x 0.4 cm. No left pleural effusion. Wedge resection suture lines again noted in the posterior right upper and right lower lobes. Curvilinear parenchymal banding  and volume loss throughout the posterior right lung, similar. No acute consolidative airspace disease. Solid 0.3 cm right middle lobe pulmonary nodule (series 7/image 102) is stable. No new significant pulmonary nodules. Upper abdomen: Hypodense posterior lateral segment left liver  3.4 cm mass (series 2/image 143), stable since baseline 11/06/2015 CT, compatible with a benign lesion such as a hemangioma. Simple 2.0 cm posterior interpolar right renal cyst, for which no follow-up is recommended. Musculoskel

## 2021-10-10 ENCOUNTER — Other Ambulatory Visit: Payer: 59

## 2021-10-10 ENCOUNTER — Ambulatory Visit: Payer: 59 | Admitting: Internal Medicine

## 2021-10-14 DIAGNOSIS — C499 Malignant neoplasm of connective and soft tissue, unspecified: Secondary | ICD-10-CM

## 2021-10-14 DIAGNOSIS — C7989 Secondary malignant neoplasm of other specified sites: Secondary | ICD-10-CM

## 2021-10-14 HISTORY — DX: Secondary malignant neoplasm of other specified sites: C79.89

## 2021-10-14 HISTORY — DX: Malignant neoplasm of connective and soft tissue, unspecified: C49.9

## 2021-10-16 ENCOUNTER — Ambulatory Visit: Payer: 59 | Admitting: Urology

## 2021-10-23 ENCOUNTER — Ambulatory Visit: Payer: 59 | Admitting: Urology

## 2021-10-23 ENCOUNTER — Encounter: Payer: Self-pay | Admitting: Urology

## 2021-10-23 VITALS — BP 149/63 | HR 73 | Ht 69.0 in | Wt 177.0 lb

## 2021-10-23 DIAGNOSIS — R972 Elevated prostate specific antigen [PSA]: Secondary | ICD-10-CM

## 2021-10-23 NOTE — Progress Notes (Signed)
? ?  10/23/2021 ?4:14 PM  ? ?Mathew [name] ?10-10-1960 ?[id number] ? ?Reason for visit: Follow up elevated PSA ? ?HPI: ?61 year old male who is otherwise extremely healthy but was diagnosed with a rare solitary pulmonary fibrous tumor in 2016 treated with surgery that recurred and 2021 requiring repeat surgery and was most recently on sunitinib.  Unfortunately, on most recent CT chest from April 2023 he was found to have significant progression of his disease.  I personally viewed and interpreted those images.  I reviewed the outside notes from his oncologist Dr. Julien Nordmann. He has an upcoming consultation with the thoracic surgeon Dr. Roxan Hockey in [place] to consider resection and potentially adjuvant systemic treatments. ? ?I have been following him for a elevated PSA.  PSA was 2.1 in October 2020, 3.8 in October 2021, 5 in October 2020, 5.4(9% free) in January 2023, and most recently 8.1 in April 2023.  Calculated PSA doubling time is 18 months. ? ?We again discussed options at length with his complex history and recurrence of pulmonary fibrous tumor.  We reviewed that PSA screening is ideally performed in patients with at least 10 to 15 years of life expectancy.  Risk and benefits of screening discussed at length.  We discussed options including a repeat PSA, prostate MRI, or prostate biopsy.  Using shared decision making, he would like to pursue prostate MRI which I think is very reasonable.  We discussed the need for possible biopsy if MRI showed any suspicious findings. ? ?Prostate MRI, call with results ? ? ?[name], MD ? ?[facility] ?[address] ?[place],  [address] ?[phone] ? ?

## 2021-10-23 NOTE — Patient Instructions (Signed)

## 2021-10-31 ENCOUNTER — Other Ambulatory Visit: Payer: Self-pay | Admitting: *Deleted

## 2021-10-31 ENCOUNTER — Encounter: Payer: Self-pay | Admitting: *Deleted

## 2021-10-31 ENCOUNTER — Encounter: Payer: Self-pay | Admitting: Thoracic Surgery (Cardiothoracic Vascular Surgery)

## 2021-10-31 ENCOUNTER — Ambulatory Visit: Payer: 59 | Admitting: Thoracic Surgery (Cardiothoracic Vascular Surgery)

## 2021-10-31 VITALS — BP 170/77 | HR 67 | Resp 20 | Ht 69.0 in | Wt 177.0 lb

## 2021-10-31 DIAGNOSIS — Z9889 Other specified postprocedural states: Secondary | ICD-10-CM | POA: Diagnosis not present

## 2021-10-31 DIAGNOSIS — J948 Other specified pleural conditions: Secondary | ICD-10-CM

## 2021-10-31 NOTE — H&P (View-Only) (Signed)
[address]       [place] [address]             [phone]    HPI: Mathew [name] returns for reevaluation for surgery for his pleural masses.  Mathew [name] is a 61 year old man with a history of recurrent fibrous tumors of the pleura, asthma, arthritis, and reflux.  He originally presented with exertional shortness of breath in 2017.  He exercises vigorously and noted that towards the end of his workouts he would have more trouble breathing.  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  He underwent a right thoracotomy and resection of that.  4 years later he presented with dyspnea and was found to have a large right pleural effusion and multiple pleural masses.  I did a redo and resected multiple masses and drained the effusion.  The pleural tumors had multiple different histologies with some appearing malignant.  He has been followed by Dr. Julien Nordmann and Dr. Kendall Flack at [facility] since then.  In October 2022 CT showed a new 1.4 cm pleural nodule.  He was started on sunitinib.  Follow-up CT 3 months later showed progression.  He was then changed to as pazopanib.  He tolerated that treatment well but his CT again showed progression of disease.  Since the past 2 weeks he started noticed some shortness of breath with heavy exertion.  No chest pain, pressure, or tightness.  No peripheral edema.  He otherwise feels well.  Zubrod Score: At the time of surgery this patient's most appropriate activity status/level should be described as: [x]     0    Normal activity, no symptoms []     1    Restricted in physical strenuous activity but ambulatory, able to do out light work []     2    Ambulatory and capable of self care, unable to do work activities, up and about >50 % of waking hours                              []     3    Only limited self care, in bed greater than 50% of waking hours []     4    Completely disabled, no self care, confined to bed or chair []      5    Moribund  Past Medical History:  Diagnosis Date   Arthritis    osteo   Asthma    asthma as a child   Clubbing of nails    Deafness in left ear    GERD (gastroesophageal reflux disease)    History of kidney stones    Pneumonia    Shortness of breath dyspnea    just initially   Vitamin D deficiency    White coat syndrome with high blood pressure without hypertension    4 years ago   Past Surgical History:  Procedure Laterality Date   INTERCOSTAL NERVE BLOCK Right 04/27/2020   Procedure: INTERCOSTAL NERVE BLOCK;  Surgeon: [name], MD;  Location: [facility];  Service: Thoracic;  Laterality: Right;   KNEE CARTILAGE SURGERY Left 1976   pleural tumor Right 01/22/2016   resection of solitary fibrous tumor   RESECTION OF MEDIASTINAL MASS Right 01/23/2016   this is in error- no mediastinal tumor   THORACOTOMY Right 04/27/2020   Procedure: THORACOTOMY;  Surgeon: [name], MD;  Location: [facility]  OR;  Service: Thoracic;  Laterality: Right;   TONSILLECTOMY     VIDEO BRONCHOSCOPY WITH ENDOBRONCHIAL ULTRASOUND N/A 12/17/2015   Procedure: VIDEO BRONCHOSCOPY WITH ENDOBRONCHIAL ULTRASOUND;  Surgeon: [name], MD;  Location: [facility] OR;  Service: Thoracic;  Laterality: N/A;    Current Outpatient Medications  Medication Sig Dispense Refill   amLODipine (NORVASC) 10 MG tablet Take 10 mg by mouth daily.     Cholecalciferol (VITAMIN D3) 50 MCG (2000 UT) capsule Take 1 capsule (2,000 Units total) by mouth daily. 1 capsule 0   No current facility-administered medications for this visit.    Physical Exam BP (!) 170/77 (BP Location: Left Arm, Patient Position: Sitting, Cuff Size: Normal)   Pulse 67   Resp 20   Ht 5\' 9"  (1.753 m)   Wt 177 lb (80.3 kg)   SpO2 98% Comment: RA  BMI 26.16 kg/m  61 year old man in no acute distress Well-developed and well-nourished Alert and oriented x3 with no focal deficits No cervical or supraclavicular adenopathy Cardiac regular  rate and rhythm with no murmur Lungs clear with equal breath sounds bilaterally Right chest incisions well-healed Abdomen soft nontender No clubbing, cyanosis or edema  Diagnostic Tests: CT CHEST WITH CONTRAST   TECHNIQUE: Multidetector CT imaging of the chest was performed during intravenous contrast administration.   RADIATION DOSE REDUCTION: This exam was performed according to the departmental dose-optimization program which includes automated exposure control, adjustment of the mA and/or kV according to patient size and/or use of iterative reconstruction technique.   CONTRAST:  19mL OMNIPAQUE IOHEXOL 300 MG/ML  SOLN   COMPARISON:  Most recent CT chest 07/01/2021.  04/23/2020 PET-CT.   FINDINGS: Cardiovascular: Normal heart size. No significant pericardial effusion/thickening. Left anterior descending coronary atherosclerosis. Great vessels are normal in course and caliber. No central pulmonary emboli.   Mediastinum/Nodes: No discrete thyroid nodules. Unremarkable esophagus. No pathologically enlarged axillary, mediastinal or hilar lymph nodes.   Lungs/Pleura: No pneumothorax. Small basilar right pleural effusion is increased. Innumerable enhancing right pleural masses scattered throughout the right pleural space, increased in size and number. Representative medial upper right pleural 4.7 x 3.5 cm mass (series 2/image 56), increased from 2.2 x 2.1 cm. Representative anteromedial basilar right pleural 1.7 x 1.3 cm mass (series 2/image 122), new. Representative 2.8 x 1.7 cm peripheral posterior right pleural mass (series 2/image 118), increased from 0.7 x 0.4 cm. No left pleural effusion. Wedge resection suture lines again noted in the posterior right upper and right lower lobes. Curvilinear parenchymal banding and volume loss throughout the posterior right lung, similar. No acute consolidative airspace disease. Solid 0.3 cm right middle lobe pulmonary nodule (series  7/image 102) is stable. No new significant pulmonary nodules.   Upper abdomen: Hypodense posterior lateral segment left liver 3.4 cm mass (series 2/image 143), stable since baseline 11/06/2015 CT, compatible with a benign lesion such as a hemangioma. Simple 2.0 cm posterior interpolar right renal cyst, for which no follow-up is recommended.   Musculoskeletal: No aggressive appearing focal osseous lesions. Mild thoracic spondylosis.   IMPRESSION: Significant interval progression of multifocal right pleural metastatic disease. Increased small basilar right pleural effusion.     Electronically Signed   By: Ilona Sorrel M.D.   On: 10/09/2021 11:22 I personally reviewed the CT images.  There has been progression of disease in the interim since his scan in January.  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  He then had recurrence in 2021 with multiple tumors and a large pleural effusion.  Interestingly the recurrent tumors had a different morphology than his original tumor.  He developed a recurrence once again in October 2022.  He was tried on sunitinib but had progression of disease.  He then was given a course of pazopanib, but again disease progressed.  We again discussed therapeutic options.  His disease is relatively limited and is treatable although I am not sure it is curable.  He has not responded to systemic therapy.  Options would be radiation versus surgical resection.  I think a surgical resection gives him a better possibility of a cure, albeit very limited.  We discussed doing a redo right thoracotomy for resection of pleural tumors.  He is familiar with the procedure having undergone similar procedures 2 times previously.  He does know that there is a high risk of complications with repeated surgery due to scarring from previous operations.  I informed him of the indications, risks, benefits,  and alternatives.  He understands the risks include, but not limited to death, MI, DVT, PE, bleeding, possible need for transfusion, infection, air leaks, as well as possibility of other unforeseeable complications.  He wishes to proceed.  Plan: Redo right thoracotomy for resection of pleural masses on Wednesday, 11/13/2021.  [name], MD [facility] [phone]

## 2021-10-31 NOTE — Progress Notes (Signed)
[address]       [place] [address]             [phone]    HPI: Mathew [name] returns for reevaluation for surgery for his pleural masses.  Mathew [name] is a 61 year old man with a history of recurrent fibrous tumors of the pleura, asthma, arthritis, and reflux.  He originally presented with exertional shortness of breath in 2017.  He exercises vigorously and noted that towards the end of his workouts he would have more trouble breathing.  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  He underwent a right thoracotomy and resection of that.  4 years later he presented with dyspnea and was found to have a large right pleural effusion and multiple pleural masses.  I did a redo and resected multiple masses and drained the effusion.  The pleural tumors had multiple different histologies with some appearing malignant.  He has been followed by Dr. Julien Nordmann and Dr. Kendall Flack at [facility] since then.  In October 2022 CT showed a new 1.4 cm pleural nodule.  He was started on sunitinib.  Follow-up CT 3 months later showed progression.  He was then changed to as pazopanib.  He tolerated that treatment well but his CT again showed progression of disease.  Since the past 2 weeks he started noticed some shortness of breath with heavy exertion.  No chest pain, pressure, or tightness.  No peripheral edema.  He otherwise feels well.  Zubrod Score: At the time of surgery this patient's most appropriate activity status/level should be described as: [x]     0    Normal activity, no symptoms []     1    Restricted in physical strenuous activity but ambulatory, able to do out light work []     2    Ambulatory and capable of self care, unable to do work activities, up and about >50 % of waking hours                              []     3    Only limited self care, in bed greater than 50% of waking hours []     4    Completely disabled, no self care, confined to bed or chair []      5    Moribund  Past Medical History:  Diagnosis Date   Arthritis    osteo   Asthma    asthma as a child   Clubbing of nails    Deafness in left ear    GERD (gastroesophageal reflux disease)    History of kidney stones    Pneumonia    Shortness of breath dyspnea    just initially   Vitamin D deficiency    White coat syndrome with high blood pressure without hypertension    4 years ago   Past Surgical History:  Procedure Laterality Date   INTERCOSTAL NERVE BLOCK Right 04/27/2020   Procedure: INTERCOSTAL NERVE BLOCK;  Surgeon: [name], MD;  Location: [facility];  Service: Thoracic;  Laterality: Right;   KNEE CARTILAGE SURGERY Left 1976   pleural tumor Right 01/22/2016   resection of solitary fibrous tumor   RESECTION OF MEDIASTINAL MASS Right 01/23/2016   this is in error- no mediastinal tumor   THORACOTOMY Right 04/27/2020   Procedure: THORACOTOMY;  Surgeon: [name], MD;  Location[facility]  OR;  Service: Thoracic;  Laterality: Right;   TONSILLECTOMY     VIDEO BRONCHOSCOPY WITH ENDOBRONCHIAL ULTRASOUND N/A 12/17/2015   Procedure: VIDEO BRONCHOSCOPY WITH ENDOBRONCHIAL ULTRASOUND;  Surgeon: [name], MD;  Location: [facility] OR;  Service: Thoracic;  Laterality: N/A;    Current Outpatient Medications  Medication Sig Dispense Refill   amLODipine (NORVASC) 10 MG tablet Take 10 mg by mouth daily.     Cholecalciferol (VITAMIN D3) 50 MCG (2000 UT) capsule Take 1 capsule (2,000 Units total) by mouth daily. 1 capsule 0   No current facility-administered medications for this visit.    Physical Exam BP (!) 170/77 (BP Location: Left Arm, Patient Position: Sitting, Cuff Size: Normal)   Pulse 67   Resp 20   Ht 5\' 9"  (1.753 m)   Wt 177 lb (80.3 kg)   SpO2 98% Comment: RA  BMI 26.23 kg/m  61 year old man in no acute distress Well-developed and well-nourished Alert and oriented x3 with no focal deficits No cervical or supraclavicular adenopathy Cardiac regular  rate and rhythm with no murmur Lungs clear with equal breath sounds bilaterally Right chest incisions well-healed Abdomen soft nontender No clubbing, cyanosis or edema  Diagnostic Tests: CT CHEST WITH CONTRAST   TECHNIQUE: Multidetector CT imaging of the chest was performed during intravenous contrast administration.   RADIATION DOSE REDUCTION: This exam was performed according to the departmental dose-optimization program which includes automated exposure control, adjustment of the mA and/or kV according to patient size and/or use of iterative reconstruction technique.   CONTRAST:  59mL OMNIPAQUE IOHEXOL 300 MG/ML  SOLN   COMPARISON:  Most recent CT chest 07/01/2021.  04/23/2020 PET-CT.   FINDINGS: Cardiovascular: Normal heart size. No significant pericardial effusion/thickening. Left anterior descending coronary atherosclerosis. Great vessels are normal in course and caliber. No central pulmonary emboli.   Mediastinum/Nodes: No discrete thyroid nodules. Unremarkable esophagus. No pathologically enlarged axillary, mediastinal or hilar lymph nodes.   Lungs/Pleura: No pneumothorax. Small basilar right pleural effusion is increased. Innumerable enhancing right pleural masses scattered throughout the right pleural space, increased in size and number. Representative medial upper right pleural 4.7 x 3.5 cm mass (series 2/image 56), increased from 2.2 x 2.1 cm. Representative anteromedial basilar right pleural 1.7 x 1.3 cm mass (series 2/image 122), new. Representative 2.8 x 1.7 cm peripheral posterior right pleural mass (series 2/image 118), increased from 0.7 x 0.4 cm. No left pleural effusion. Wedge resection suture lines again noted in the posterior right upper and right lower lobes. Curvilinear parenchymal banding and volume loss throughout the posterior right lung, similar. No acute consolidative airspace disease. Solid 0.3 cm right middle lobe pulmonary nodule (series  7/image 102) is stable. No new significant pulmonary nodules.   Upper abdomen: Hypodense posterior lateral segment left liver 3.4 cm mass (series 2/image 143), stable since baseline 11/06/2015 CT, compatible with a benign lesion such as a hemangioma. Simple 2.0 cm posterior interpolar right renal cyst, for which no follow-up is recommended.   Musculoskeletal: No aggressive appearing focal osseous lesions. Mild thoracic spondylosis.   IMPRESSION: Significant interval progression of multifocal right pleural metastatic disease. Increased small basilar right pleural effusion.     Electronically Signed   By: Ilona Sorrel M.D.   On: 10/09/2021 11:22 I personally reviewed the CT images.  There has been progression of disease in the interim since his scan in January.  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  He then had recurrence in 2021 with multiple tumors and a large pleural effusion.  Interestingly the recurrent tumors had a different morphology than his original tumor.  He developed a recurrence once again in October 2022.  He was tried on sunitinib but had progression of disease.  He then was given a course of pazopanib, but again disease progressed.  We again discussed therapeutic options.  His disease is relatively limited and is treatable although I am not sure it is curable.  He has not responded to systemic therapy.  Options would be radiation versus surgical resection.  I think a surgical resection gives him a better possibility of a cure, albeit very limited.  We discussed doing a redo right thoracotomy for resection of pleural tumors.  He is familiar with the procedure having undergone similar procedures 2 times previously.  He does know that there is a high risk of complications with repeated surgery due to scarring from previous operations.  I informed him of the indications, risks, benefits,  and alternatives.  He understands the risks include, but not limited to death, MI, DVT, PE, bleeding, possible need for transfusion, infection, air leaks, as well as possibility of other unforeseeable complications.  He wishes to proceed.  Plan: Redo right thoracotomy for resection of pleural masses on Wednesday, 11/13/2021.  [name], MD [facility] [phone]

## 2021-11-05 ENCOUNTER — Other Ambulatory Visit: Payer: Self-pay | Admitting: Internal Medicine

## 2021-11-05 DIAGNOSIS — D492 Neoplasm of unspecified behavior of bone, soft tissue, and skin: Secondary | ICD-10-CM

## 2021-11-07 ENCOUNTER — Ambulatory Visit: Payer: 59

## 2021-11-08 NOTE — Progress Notes (Signed)
Surgical Instructions    Your procedure is scheduled on Wednesday, May 31st.  Report to [facility] Main Entrance "A" at 5:30 A.M., then check in with the Admitting office.  Call this number if you have problems the morning of surgery:  [phone]   If you have any questions prior to your surgery date call [phone]: Open Monday-Friday 8am-4pm    Remember:  Do not eat or drink after midnight the night before your surgery     Take these medicines the morning of surgery with A SIP OF WATER:   Amlodipine (Norvasc)   As of today, STOP taking any Aspirin (unless otherwise instructed by your surgeon) Aleve, Naproxen, Ibuprofen, Motrin, Advil, Goody's, BC's, all herbal medications, fish oil, and all vitamins.           DAY OF SURGERY: Do not wear jewelry  Do not wear lotions, powders, colognes, or deodorant. Men may shave face and neck. Do not bring valuables to the hospital.  [facility] is not responsible for any belongings or valuables. .   Do NOT Smoke (Tobacco/Vaping)  24 hours prior to your procedure  If you use a CPAP at night, you may bring your mask for your overnight stay.   Contacts, glasses, hearing aids, dentures or partials may not be worn into surgery, please bring cases for these belongings   For patients admitted to the hospital, discharge time will be determined by your treatment team.   Patients discharged the day of surgery will not be allowed to drive home, and someone needs to stay with them for 24 hours.   SURGICAL WAITING ROOM VISITATION Patients having surgery or a procedure in a hospital may have two support people. Children under the age of 75 must have an adult with them who is not the patient. They may stay in the waiting area during the procedure and may switch out with other visitors. If the patient needs to stay at the hospital during part of their recovery, the visitor guidelines for inpatient rooms apply.  Please refer to the [facility]  website for the visitor guidelines for Inpatients (after your surgery is over and you are in a regular room).    Special instructions:    Oral Hygiene is also important to reduce your risk of infection.  Remember - BRUSH YOUR TEETH THE MORNING OF SURGERY WITH YOUR REGULAR TOOTHPASTE   [place]- Preparing For Surgery  Before surgery, you can play an important role. Because skin is not sterile, your skin needs to be as free of germs as possible. You can reduce the number of germs on your skin by washing with CHG (chlorahexidine gluconate) Soap before surgery.  CHG is an antiseptic cleaner which kills germs and bonds with the skin to continue killing germs even after washing.     Please do not use if you have an allergy to CHG or antibacterial soaps. If your skin becomes reddened/irritated stop using the CHG.  Do not shave (including legs and underarms) for at least 48 hours prior to first CHG shower. It is OK to shave your face.  Please follow these instructions carefully.     Shower the NIGHT BEFORE SURGERY and the MORNING OF SURGERY with CHG Soap.   If you chose to wash your hair, wash your hair first as usual with your normal shampoo. After you shampoo, rinse your hair and body thoroughly to remove the shampoo.  Then [employer] and genitals (private parts) with your normal soap and rinse  thoroughly to remove soap.  After that Use CHG Soap as you would any other liquid soap. You can apply CHG directly to the skin and wash gently with a scrungie or a clean washcloth.   Apply the CHG Soap to your body ONLY FROM THE NECK DOWN.  Do not use on open wounds or open sores. Avoid contact with your eyes, ears, mouth and genitals (private parts). Wash Face and genitals (private parts)  with your normal soap.   Wash thoroughly, paying special attention to the area where your surgery will be performed.  Thoroughly rinse your body with warm water from the neck down.  DO NOT shower/wash with your  normal soap after using and rinsing off the CHG Soap.  Pat yourself dry with a CLEAN TOWEL.  Wear CLEAN PAJAMAS to bed the night before surgery  Place CLEAN SHEETS on your bed the night before your surgery  DO NOT SLEEP WITH PETS.   Day of Surgery:  Take a shower with CHG soap. Wear Clean/Comfortable clothing the morning of surgery Do not apply any deodorants/lotions.   Remember to brush your teeth WITH YOUR REGULAR TOOTHPASTE.   If you received a COVID test during your pre-op visit, it is requested that you wear a mask when out in public, stay away from anyone that may not be feeling well, and notify your surgeon if you develop symptoms. If you have been in contact with anyone that has tested positive in the last 10 days, please notify your surgeon.    Please read over the following fact sheets that you were given.

## 2021-11-12 ENCOUNTER — Other Ambulatory Visit: Payer: Self-pay

## 2021-11-12 ENCOUNTER — Ambulatory Visit (HOSPITAL_COMMUNITY)
Admission: RE | Admit: 2021-11-12 | Discharge: 2021-11-12 | Disposition: A | Discharge status: Home / Self Care | Payer: 59 | Source: Ambulatory Visit | Attending: Thoracic Surgery (Cardiothoracic Vascular Surgery) | Admitting: Thoracic Surgery (Cardiothoracic Vascular Surgery)

## 2021-11-12 ENCOUNTER — Encounter (HOSPITAL_COMMUNITY): Payer: Self-pay

## 2021-11-12 ENCOUNTER — Encounter (HOSPITAL_COMMUNITY)
Admission: RE | Admit: 2021-11-12 | Discharge: 2021-11-12 | Disposition: A | Discharge status: Home / Self Care | Payer: 59 | Source: Ambulatory Visit | Attending: Thoracic Surgery (Cardiothoracic Vascular Surgery) | Admitting: Thoracic Surgery (Cardiothoracic Vascular Surgery)

## 2021-11-12 ENCOUNTER — Other Ambulatory Visit (HOSPITAL_COMMUNITY): Payer: 59

## 2021-11-12 VITALS — BP 155/91 | HR 66 | Temp 98.2°F | Resp 18 | Ht 69.0 in | Wt 185.0 lb

## 2021-11-12 DIAGNOSIS — Z20822 Contact with and (suspected) exposure to covid-19: Secondary | ICD-10-CM | POA: Insufficient documentation

## 2021-11-12 DIAGNOSIS — J948 Other specified pleural conditions: Secondary | ICD-10-CM | POA: Insufficient documentation

## 2021-11-12 DIAGNOSIS — Z01818 Encounter for other preprocedural examination: Secondary | ICD-10-CM | POA: Insufficient documentation

## 2021-11-12 LAB — COMPREHENSIVE METABOLIC PANEL
ALT: 19 U/L (ref 0–44)
AST: 19 U/L (ref 15–41)
Albumin: 3.1 g/dL — ABNORMAL LOW (ref 3.5–5.0)
Alkaline Phosphatase: 88 U/L (ref 38–126)
Anion gap: 9 (ref 5–15)
BUN: 10 mg/dL (ref 8–23)
CO2: 23 mmol/L (ref 22–32)
Calcium: 8.8 mg/dL — ABNORMAL LOW (ref 8.9–10.3)
Chloride: 104 mmol/L (ref 98–111)
Creatinine, Ser: 0.74 mg/dL (ref 0.61–1.24)
GFR, Estimated: >60 mL/min (ref >60)
Glucose, Bld: 84 mg/dL (ref 70–99)
Potassium: 3.8 mmol/L (ref 3.5–5.1)
Sodium: 136 mmol/L (ref 135–145)
Total Bilirubin: 0.5 mg/dL (ref 0.3–1.2)
Total Protein: 6.2 g/dL — ABNORMAL LOW (ref 6.5–8.1)

## 2021-11-12 LAB — PROTIME-INR
INR: 1.2 (ref 0.8–1.2)
Prothrombin Time: 15.2 seconds (ref 11.4–15.2)

## 2021-11-12 LAB — URINALYSIS, ROUTINE W REFLEX MICROSCOPIC
Bilirubin Urine: NEGATIVE
Glucose, UA: NEGATIVE mg/dL
Hgb urine dipstick: NEGATIVE
Ketones, ur: NEGATIVE mg/dL
Leukocytes,Ua: NEGATIVE
Nitrite: NEGATIVE
Protein, ur: NEGATIVE mg/dL
Specific Gravity, Urine: 1.012 (ref 1.005–1.030)
pH: 7 (ref 5.0–8.0)

## 2021-11-12 LAB — BLOOD GAS, ARTERIAL
Acid-Base Excess: 4.2 mmol/L — ABNORMAL HIGH (ref 0.0–2.0)
Bicarbonate: 28.4 mmol/L — ABNORMAL HIGH (ref 20.0–28.0)
Drawn by: 60286
O2 Saturation: 96.9 %
Patient temperature: 37
pCO2 arterial: 40 mmHg (ref 32–48)
pH, Arterial: 7.46 — ABNORMAL HIGH (ref 7.35–7.45)
pO2, Arterial: 81 mmHg — ABNORMAL LOW (ref 83–108)

## 2021-11-12 LAB — SURGICAL PCR SCREEN
MRSA, PCR: NEGATIVE
Staphylococcus aureus: NEGATIVE

## 2021-11-12 LAB — CBC
HCT: 37.1 % — ABNORMAL LOW (ref 39.0–52.0)
Hemoglobin: 12.1 g/dL — ABNORMAL LOW (ref 13.0–17.0)
MCH: 32.1 pg (ref 26.0–34.0)
MCHC: 32.6 g/dL (ref 30.0–36.0)
MCV: 98.4 fL (ref 80.0–100.0)
Platelets: 230 10*3/uL (ref 150–400)
RBC: 3.77 MIL/uL — ABNORMAL LOW (ref 4.22–5.81)
RDW: 13.3 % (ref 11.5–15.5)
WBC: 6 10*3/uL (ref 4.0–10.5)
nRBC: 0 % (ref 0.0–0.2)

## 2021-11-12 LAB — SARS CORONAVIRUS 2 (TAT 6-24 HRS): SARS Coronavirus 2: NEGATIVE

## 2021-11-12 LAB — APTT: aPTT: 39 seconds — ABNORMAL HIGH (ref 24–36)

## 2021-11-12 NOTE — Progress Notes (Addendum)
PCP - [name], MD Cardiologist - Saw one once when tumor was discovered, maybe 6 years ago  PPM/ICD - Denies  Chest x-ray - 11/12/21 EKG - 11/12/21 Stress Test - 2000 ECHO - yes 6 years ago when he saw the cardiologist Cardiac Cath - Denies  Sleep Study - Denies  DM - Denies  ERAS Protcol - No  COVID TEST- 11/12/21   Anesthesia review: yes abn EKG  Patient denies shortness of breath, fever, cough and chest pain at PAT appointment   All instructions explained to the patient, with a verbal understanding of the material. Patient agrees to go over the instructions while at home for a better understanding.  The opportunity to ask questions was provided.

## 2021-11-13 ENCOUNTER — Inpatient Hospital Stay (HOSPITAL_COMMUNITY): Payer: 59 | Admitting: Physician Assistant

## 2021-11-13 ENCOUNTER — Encounter (HOSPITAL_COMMUNITY): Payer: Self-pay | Admitting: Thoracic Surgery (Cardiothoracic Vascular Surgery)

## 2021-11-13 ENCOUNTER — Inpatient Hospital Stay (HOSPITAL_COMMUNITY)
Admission: RE | Admit: 2021-11-13 | Discharge: 2021-11-20 | DRG: 164 | Disposition: A | Discharge status: Home / Self Care | Payer: 59 | Attending: Thoracic Surgery (Cardiothoracic Vascular Surgery) | Admitting: Thoracic Surgery (Cardiothoracic Vascular Surgery)

## 2021-11-13 ENCOUNTER — Encounter (HOSPITAL_COMMUNITY)
Admission: RE | Disposition: A | Discharge status: Home / Self Care | Payer: Self-pay | Source: Home / Self Care | Attending: Thoracic Surgery (Cardiothoracic Vascular Surgery)

## 2021-11-13 ENCOUNTER — Inpatient Hospital Stay (HOSPITAL_COMMUNITY): Payer: 59

## 2021-11-13 ENCOUNTER — Other Ambulatory Visit: Payer: Self-pay

## 2021-11-13 DIAGNOSIS — H9192 Unspecified hearing loss, left ear: Secondary | ICD-10-CM | POA: Diagnosis present

## 2021-11-13 DIAGNOSIS — R918 Other nonspecific abnormal finding of lung field: Secondary | ICD-10-CM | POA: Diagnosis present

## 2021-11-13 DIAGNOSIS — D62 Acute posthemorrhagic anemia: Secondary | ICD-10-CM | POA: Diagnosis not present

## 2021-11-13 DIAGNOSIS — J9382 Other air leak: Secondary | ICD-10-CM | POA: Diagnosis not present

## 2021-11-13 DIAGNOSIS — I10 Essential (primary) hypertension: Secondary | ICD-10-CM | POA: Diagnosis present

## 2021-11-13 DIAGNOSIS — J90 Pleural effusion, not elsewhere classified: Secondary | ICD-10-CM | POA: Diagnosis present

## 2021-11-13 DIAGNOSIS — Z79899 Other long term (current) drug therapy: Secondary | ICD-10-CM | POA: Diagnosis not present

## 2021-11-13 DIAGNOSIS — N289 Disorder of kidney and ureter, unspecified: Secondary | ICD-10-CM | POA: Diagnosis not present

## 2021-11-13 DIAGNOSIS — K219 Gastro-esophageal reflux disease without esophagitis: Secondary | ICD-10-CM | POA: Diagnosis present

## 2021-11-13 DIAGNOSIS — C771 Secondary and unspecified malignant neoplasm of intrathoracic lymph nodes: Secondary | ICD-10-CM | POA: Diagnosis present

## 2021-11-13 DIAGNOSIS — C384 Malignant neoplasm of pleura: Secondary | ICD-10-CM | POA: Diagnosis present

## 2021-11-13 DIAGNOSIS — Z20822 Contact with and (suspected) exposure to covid-19: Secondary | ICD-10-CM | POA: Diagnosis present

## 2021-11-13 DIAGNOSIS — I4891 Unspecified atrial fibrillation: Secondary | ICD-10-CM | POA: Diagnosis not present

## 2021-11-13 DIAGNOSIS — J984 Other disorders of lung: Secondary | ICD-10-CM | POA: Diagnosis present

## 2021-11-13 DIAGNOSIS — M1990 Unspecified osteoarthritis, unspecified site: Secondary | ICD-10-CM | POA: Diagnosis present

## 2021-11-13 DIAGNOSIS — J948 Other specified pleural conditions: Secondary | ICD-10-CM

## 2021-11-13 DIAGNOSIS — D382 Neoplasm of uncertain behavior of pleura: Secondary | ICD-10-CM | POA: Diagnosis not present

## 2021-11-13 DIAGNOSIS — D491 Neoplasm of unspecified behavior of respiratory system: Secondary | ICD-10-CM | POA: Diagnosis not present

## 2021-11-13 DIAGNOSIS — E872 Acidosis, unspecified: Secondary | ICD-10-CM | POA: Diagnosis present

## 2021-11-13 DIAGNOSIS — R222 Localized swelling, mass and lump, trunk: Secondary | ICD-10-CM

## 2021-11-13 DIAGNOSIS — Z9889 Other specified postprocedural states: Principal | ICD-10-CM

## 2021-11-13 HISTORY — PX: THORACOTOMY: SHX5074

## 2021-11-13 HISTORY — PX: RESECTION OF MEDIASTINAL MASS: SHX6497

## 2021-11-13 LAB — POCT I-STAT 7, (LYTES, BLD GAS, ICA,H+H)
Acid-Base Excess: 2 mmol/L (ref 0.0–2.0)
Acid-Base Excess: 0 mmol/L (ref 0.0–2.0)
Acid-Base Excess: 1 mmol/L (ref 0.0–2.0)
Acid-Base Excess: 3 mmol/L — ABNORMAL HIGH (ref 0.0–2.0)
Bicarbonate: 27.7 mmol/L (ref 20.0–28.0)
Bicarbonate: 26.3 mmol/L (ref 20.0–28.0)
Bicarbonate: 26 mmol/L (ref 20.0–28.0)
Bicarbonate: 28.2 mmol/L — ABNORMAL HIGH (ref 20.0–28.0)
Calcium, Ion: 1.25 mmol/L (ref 1.15–1.40)
Calcium, Ion: 1.22 mmol/L (ref 1.15–1.40)
Calcium, Ion: 1.22 mmol/L (ref 1.15–1.40)
Calcium, Ion: 1.16 mmol/L (ref 1.15–1.40)
HCT: 31 % — ABNORMAL LOW (ref 39.0–52.0)
HCT: 31 % — ABNORMAL LOW (ref 39.0–52.0)
HCT: 30 % — ABNORMAL LOW (ref 39.0–52.0)
HCT: 30 % — ABNORMAL LOW (ref 39.0–52.0)
Hemoglobin: 10.5 g/dL — ABNORMAL LOW (ref 13.0–17.0)
Hemoglobin: 10.5 g/dL — ABNORMAL LOW (ref 13.0–17.0)
Hemoglobin: 10.2 g/dL — ABNORMAL LOW (ref 13.0–17.0)
Hemoglobin: 10.2 g/dL — ABNORMAL LOW (ref 13.0–17.0)
O2 Saturation: 96 %
O2 Saturation: 98 %
O2 Saturation: 99 %
O2 Saturation: 100 %
Patient temperature: 35.1
Patient temperature: 37.2
Patient temperature: 37.5
Patient temperature: 97.8
Potassium: 4.1 mmol/L (ref 3.5–5.1)
Potassium: 4.1 mmol/L (ref 3.5–5.1)
Potassium: 4.1 mmol/L (ref 3.5–5.1)
Potassium: 5.7 mmol/L — ABNORMAL HIGH (ref 3.5–5.1)
Sodium: 139 mmol/L (ref 135–145)
Sodium: 138 mmol/L (ref 135–145)
Sodium: 138 mmol/L (ref 135–145)
Sodium: 135 mmol/L (ref 135–145)
TCO2: 29 mmol/L (ref 22–32)
TCO2: 28 mmol/L (ref 22–32)
TCO2: 27 mmol/L (ref 22–32)
TCO2: 30 mmol/L (ref 22–32)
pCO2 arterial: 45.5 mmHg (ref 32–48)
pCO2 arterial: 48.1 mmHg — ABNORMAL HIGH (ref 32–48)
pCO2 arterial: 44.8 mmHg (ref 32–48)
pCO2 arterial: 45.9 mmHg (ref 32–48)
pH, Arterial: 7.384 (ref 7.35–7.45)
pH, Arterial: 7.347 — ABNORMAL LOW (ref 7.35–7.45)
pH, Arterial: 7.374 (ref 7.35–7.45)
pH, Arterial: 7.394 (ref 7.35–7.45)
pO2, Arterial: 74 mmHg — ABNORMAL LOW (ref 83–108)
pO2, Arterial: 105 mmHg (ref 83–108)
pO2, Arterial: 122 mmHg — ABNORMAL HIGH (ref 83–108)
pO2, Arterial: 479 mmHg — ABNORMAL HIGH (ref 83–108)

## 2021-11-13 LAB — CBC
HCT: 32.3 % — ABNORMAL LOW (ref 39.0–52.0)
Hemoglobin: 11 g/dL — ABNORMAL LOW (ref 13.0–17.0)
MCH: 32.1 pg (ref 26.0–34.0)
MCHC: 34.1 g/dL (ref 30.0–36.0)
MCV: 94.2 fL (ref 80.0–100.0)
Platelets: 193 10*3/uL (ref 150–400)
RBC: 3.43 MIL/uL — ABNORMAL LOW (ref 4.22–5.81)
RDW: 14.2 % (ref 11.5–15.5)
WBC: 17.3 10*3/uL — ABNORMAL HIGH (ref 4.0–10.5)
nRBC: 0 % (ref 0.0–0.2)

## 2021-11-13 LAB — GLUCOSE, CAPILLARY: Glucose-Capillary: 245 mg/dL — ABNORMAL HIGH (ref 70–99)

## 2021-11-13 LAB — BASIC METABOLIC PANEL
Anion gap: 7 (ref 5–15)
BUN: 15 mg/dL (ref 8–23)
CO2: 21 mmol/L — ABNORMAL LOW (ref 22–32)
Calcium: 7.6 mg/dL — ABNORMAL LOW (ref 8.9–10.3)
Chloride: 107 mmol/L (ref 98–111)
Creatinine, Ser: 1.01 mg/dL (ref 0.61–1.24)
GFR, Estimated: >60 mL/min (ref >60)
Glucose, Bld: 271 mg/dL — ABNORMAL HIGH (ref 70–99)
Potassium: 5.5 mmol/L — ABNORMAL HIGH (ref 3.5–5.1)
Sodium: 135 mmol/L (ref 135–145)

## 2021-11-13 LAB — PREPARE RBC (CROSSMATCH)

## 2021-11-13 LAB — MAGNESIUM: Magnesium: 1.4 mg/dL — ABNORMAL LOW (ref 1.7–2.4)

## 2021-11-13 SURGERY — THORACOTOMY, MAJOR
Anesthesia: General | Laterality: Right

## 2021-11-13 MED ORDER — AMIODARONE HCL IN DEXTROSE 360-4.14 MG/200ML-% IV SOLN
INTRAVENOUS | Status: DC | PRN
  Administered 2021-11-13 (×2): 60 mg/h via INTRAVENOUS

## 2021-11-13 MED ORDER — VASOPRESSIN 20 UNIT/ML IV SOLN
INTRAVENOUS | Status: AC
  Filled 2021-11-13: qty 1

## 2021-11-13 MED ORDER — ONDANSETRON HCL 4 MG/2ML IJ SOLN
INTRAMUSCULAR | Status: DC | PRN
  Administered 2021-11-13: 4 mg via INTRAVENOUS

## 2021-11-13 MED ORDER — SODIUM CHLORIDE 0.9 % IV SOLN: 10.0000 mL/h | Freq: Once | INTRAVENOUS | Status: AC

## 2021-11-13 MED ORDER — GABAPENTIN 300 MG PO CAPS
300.0000 mg | ORAL_CAPSULE | Freq: Two times a day (BID) | ORAL | Status: DC
  Filled 2021-11-13: qty 1

## 2021-11-13 MED ORDER — ROCURONIUM BROMIDE 10 MG/ML (PF) SYRINGE
PREFILLED_SYRINGE | INTRAVENOUS | Status: AC
  Filled 2021-11-13: qty 10

## 2021-11-13 MED ORDER — CHLORHEXIDINE GLUCONATE CLOTH 2 % EX PADS
6.0000 | MEDICATED_PAD | Freq: Every day | CUTANEOUS | Status: DC
  Administered 2021-11-13 – 2021-11-14 (×3): 6 via TOPICAL

## 2021-11-13 MED ORDER — LIDOCAINE 2% (20 MG/ML) 5 ML SYRINGE
INTRAMUSCULAR | Status: DC | PRN
  Administered 2021-11-13: 60 mg via INTRAVENOUS

## 2021-11-13 MED ORDER — SODIUM CHLORIDE 0.9% IV SOLUTION: Freq: Once | INTRAVENOUS | Status: DC

## 2021-11-13 MED ORDER — PHENYLEPHRINE 80 MCG/ML (10ML) SYRINGE FOR IV PUSH (FOR BLOOD PRESSURE SUPPORT)
PREFILLED_SYRINGE | INTRAVENOUS | Status: AC
  Filled 2021-11-13: qty 10

## 2021-11-13 MED ORDER — LACTATED RINGERS IV SOLN: INTRAVENOUS | Status: DC | PRN

## 2021-11-13 MED ORDER — SODIUM CHLORIDE 0.9 % IV SOLN: 250.0000 mL | INTRAVENOUS | Status: DC

## 2021-11-13 MED ORDER — BUPIVACAINE LIPOSOME 1.3 % IJ SUSP
INTRAMUSCULAR | Status: AC
  Filled 2021-11-13: qty 20

## 2021-11-13 MED ORDER — BISACODYL 5 MG PO TBEC
10.0000 mg | DELAYED_RELEASE_TABLET | Freq: Every day | ORAL | Status: DC
  Administered 2021-11-14 – 2021-11-20 (×7): 10 mg via ORAL
  Filled 2021-11-13 (×7): qty 2

## 2021-11-13 MED ORDER — MORPHINE SULFATE (PF) 2 MG/ML IV SOLN
2.0000 mg | INTRAVENOUS | Status: DC | PRN
  Administered 2021-11-14: 2 mg via INTRAVENOUS
  Filled 2021-11-13: qty 1

## 2021-11-13 MED ORDER — EPHEDRINE SULFATE-NACL 50-0.9 MG/10ML-% IV SOSY
PREFILLED_SYRINGE | INTRAVENOUS | Status: DC | PRN
  Administered 2021-11-13: 2.5 mg via INTRAVENOUS
  Administered 2021-11-13: 10 mg via INTRAVENOUS
  Administered 2021-11-13: 5 mg via INTRAVENOUS
  Administered 2021-11-13 (×2): 2.5 mg via INTRAVENOUS
  Administered 2021-11-13: 5 mg via INTRAVENOUS

## 2021-11-13 MED ORDER — PROPOFOL 500 MG/50ML IV EMUL
INTRAVENOUS | Status: DC | PRN
  Administered 2021-11-13: 50 ug/kg/min via INTRAVENOUS

## 2021-11-13 MED ORDER — VANCOMYCIN HCL IN DEXTROSE 1-5 GM/200ML-% IV SOLN
1000.0000 mg | INTRAVENOUS | Status: AC
  Administered 2021-11-13: 1000 mg via INTRAVENOUS
  Filled 2021-11-13: qty 200

## 2021-11-13 MED ORDER — GABAPENTIN 250 MG/5ML PO SOLN
300.0000 mg | Freq: Two times a day (BID) | ORAL | Status: DC
  Filled 2021-11-13: qty 6

## 2021-11-13 MED ORDER — ORAL CARE MOUTH RINSE
15.0000 mL | OROMUCOSAL | Status: DC
  Administered 2021-11-13 – 2021-11-14 (×5): 15 mL via OROMUCOSAL

## 2021-11-13 MED ORDER — EPHEDRINE 5 MG/ML INJ
INTRAVENOUS | Status: AC
  Filled 2021-11-13: qty 5

## 2021-11-13 MED ORDER — ORAL CARE MOUTH RINSE: 15.0000 mL | Freq: Once | OROMUCOSAL | Status: AC

## 2021-11-13 MED ORDER — ACETAMINOPHEN 10 MG/ML IV SOLN
INTRAVENOUS | Status: DC | PRN
  Administered 2021-11-13: 1000 mg via INTRAVENOUS

## 2021-11-13 MED ORDER — FENTANYL CITRATE (PF) 250 MCG/5ML IJ SOLN
INTRAMUSCULAR | Status: AC
  Filled 2021-11-13: qty 5

## 2021-11-13 MED ORDER — MIDAZOLAM HCL 2 MG/2ML IJ SOLN
INTRAMUSCULAR | Status: DC | PRN
  Administered 2021-11-13 (×2): 1 mg via INTRAVENOUS

## 2021-11-13 MED ORDER — VANCOMYCIN HCL IN DEXTROSE 1-5 GM/200ML-% IV SOLN
1000.0000 mg | Freq: Two times a day (BID) | INTRAVENOUS | Status: AC
  Administered 2021-11-13: 1000 mg via INTRAVENOUS
  Filled 2021-11-13: qty 200

## 2021-11-13 MED ORDER — ONDANSETRON HCL 4 MG/2ML IJ SOLN
4.0000 mg | Freq: Four times a day (QID) | INTRAMUSCULAR | Status: DC | PRN
Start: 2021-11-13 — End: 2021-11-20

## 2021-11-13 MED ORDER — PROPOFOL 10 MG/ML IV BOLUS
INTRAVENOUS | Status: AC
  Filled 2021-11-13: qty 20

## 2021-11-13 MED ORDER — SODIUM CHLORIDE 0.9 % IV SOLN: INTRAVENOUS | Status: DC

## 2021-11-13 MED ORDER — PHENYLEPHRINE HCL-NACL 20-0.9 MG/250ML-% IV SOLN: 0.0000 ug/min | INTRAVENOUS | Status: DC

## 2021-11-13 MED ORDER — ALBUTEROL SULFATE (2.5 MG/3ML) 0.083% IN NEBU
INHALATION_SOLUTION | RESPIRATORY_TRACT | Status: AC
  Administered 2021-11-13: 2.5 mg
  Filled 2021-11-13: qty 3

## 2021-11-13 MED ORDER — HEMOSTATIC AGENTS (NO CHARGE) OPTIME
TOPICAL | Status: DC | PRN
  Administered 2021-11-13: 1 via TOPICAL

## 2021-11-13 MED ORDER — PROPOFOL 1000 MG/100ML IV EMUL: 5.0000 ug/kg/min | INTRAVENOUS | Status: DC

## 2021-11-13 MED ORDER — GLYCOPYRROLATE PF 0.2 MG/ML IJ SOSY
PREFILLED_SYRINGE | INTRAMUSCULAR | Status: AC
  Filled 2021-11-13: qty 1

## 2021-11-13 MED ORDER — TRAMADOL HCL 50 MG PO TABS
50.0000 mg | ORAL_TABLET | Freq: Four times a day (QID) | ORAL | Status: DC | PRN
  Administered 2021-11-14: 50 mg via ORAL
  Administered 2021-11-15 – 2021-11-16 (×2): 100 mg via ORAL
  Administered 2021-11-16 – 2021-11-18 (×3): 50 mg via ORAL
  Administered 2021-11-19: 100 mg via ORAL
  Filled 2021-11-13: qty 1
  Filled 2021-11-13: qty 2
  Filled 2021-11-13: qty 1
  Filled 2021-11-13: qty 2
  Filled 2021-11-13 (×2): qty 1
  Filled 2021-11-13: qty 2
  Filled 2021-11-13: qty 1

## 2021-11-13 MED ORDER — ROCURONIUM BROMIDE 10 MG/ML (PF) SYRINGE
PREFILLED_SYRINGE | INTRAVENOUS | Status: AC
  Filled 2021-11-13: qty 20

## 2021-11-13 MED ORDER — LACTATED RINGERS IV SOLN: INTRAVENOUS | Status: DC

## 2021-11-13 MED ORDER — HYDROMORPHONE HCL 1 MG/ML IJ SOLN
INTRAMUSCULAR | Status: AC
  Filled 2021-11-13: qty 0.5

## 2021-11-13 MED ORDER — ONDANSETRON HCL 4 MG/2ML IJ SOLN
INTRAMUSCULAR | Status: AC
  Filled 2021-11-13: qty 2

## 2021-11-13 MED ORDER — AMIODARONE HCL IN DEXTROSE 360-4.14 MG/200ML-% IV SOLN
30.0000 mg/h | INTRAVENOUS | Status: DC
  Administered 2021-11-14 – 2021-11-15 (×3): 30 mg/h via INTRAVENOUS
  Filled 2021-11-13 (×4): qty 200

## 2021-11-13 MED ORDER — CHLORHEXIDINE GLUCONATE 0.12% ORAL RINSE (MEDLINE KIT)
15.0000 mL | Freq: Two times a day (BID) | OROMUCOSAL | Status: DC
  Administered 2021-11-13 – 2021-11-14 (×2): 15 mL via OROMUCOSAL

## 2021-11-13 MED ORDER — 0.9 % SODIUM CHLORIDE (POUR BTL) OPTIME
TOPICAL | Status: DC | PRN
  Administered 2021-11-13 (×2): 2000 mL
  Administered 2021-11-13: 1000 mL

## 2021-11-13 MED ORDER — PROPOFOL 10 MG/ML IV BOLUS
INTRAVENOUS | Status: DC | PRN
  Administered 2021-11-13: 160 mg via INTRAVENOUS
  Administered 2021-11-13: 40 mg via INTRAVENOUS

## 2021-11-13 MED ORDER — LIDOCAINE 2% (20 MG/ML) 5 ML SYRINGE
INTRAMUSCULAR | Status: AC
  Filled 2021-11-13: qty 5

## 2021-11-13 MED ORDER — ENOXAPARIN SODIUM 40 MG/0.4ML IJ SOSY
40.0000 mg | PREFILLED_SYRINGE | Freq: Every day | INTRAMUSCULAR | Status: DC
  Administered 2021-11-14: 40 mg via SUBCUTANEOUS
  Filled 2021-11-13: qty 0.4

## 2021-11-13 MED ORDER — FENTANYL CITRATE (PF) 250 MCG/5ML IJ SOLN
INTRAMUSCULAR | Status: DC | PRN
  Administered 2021-11-13 (×3): 50 ug via INTRAVENOUS
  Administered 2021-11-13: 100 ug via INTRAVENOUS
  Administered 2021-11-13: 150 ug via INTRAVENOUS
  Administered 2021-11-13 (×4): 50 ug via INTRAVENOUS

## 2021-11-13 MED ORDER — GLYCOPYRROLATE 0.2 MG/ML IJ SOLN
INTRAMUSCULAR | Status: DC | PRN
  Administered 2021-11-13: .2 mg via INTRAVENOUS

## 2021-11-13 MED ORDER — DEXAMETHASONE SODIUM PHOSPHATE 10 MG/ML IJ SOLN
INTRAMUSCULAR | Status: DC | PRN
  Administered 2021-11-13: 10 mg via INTRAVENOUS

## 2021-11-13 MED ORDER — PHENYLEPHRINE HCL-NACL 20-0.9 MG/250ML-% IV SOLN
25.0000 ug/min | INTRAVENOUS | Status: DC
  Administered 2021-11-13: 110 ug/min via INTRAVENOUS
  Administered 2021-11-14: 150 ug/min via INTRAVENOUS
  Administered 2021-11-14: 70 ug/min via INTRAVENOUS
  Administered 2021-11-14: 40 ug/min via INTRAVENOUS
  Administered 2021-11-14: 170 ug/min via INTRAVENOUS
  Administered 2021-11-14: 200 ug/min via INTRAVENOUS
  Filled 2021-11-13 (×6): qty 250

## 2021-11-13 MED ORDER — AMIODARONE HCL IN DEXTROSE 360-4.14 MG/200ML-% IV SOLN
60.0000 mg/h | INTRAVENOUS | Status: AC
  Administered 2021-11-13: 60 mg/h via INTRAVENOUS

## 2021-11-13 MED ORDER — CHLORHEXIDINE GLUCONATE 0.12 % MT SOLN
15.0000 mL | Freq: Once | OROMUCOSAL | Status: AC
  Administered 2021-11-13: 15 mL via OROMUCOSAL
  Filled 2021-11-13: qty 15

## 2021-11-13 MED ORDER — HYDROMORPHONE HCL 1 MG/ML IJ SOLN
INTRAMUSCULAR | Status: DC | PRN
  Administered 2021-11-13: .5 mg via INTRAVENOUS

## 2021-11-13 MED ORDER — MIDAZOLAM HCL 2 MG/2ML IJ SOLN
INTRAMUSCULAR | Status: AC
  Filled 2021-11-13: qty 2

## 2021-11-13 MED ORDER — ALBUTEROL SULFATE (2.5 MG/3ML) 0.083% IN NEBU: 2.5000 mg | INHALATION_SOLUTION | Freq: Four times a day (QID) | RESPIRATORY_TRACT | Status: DC | PRN

## 2021-11-13 MED ORDER — BUPIVACAINE HCL (PF) 0.5 % IJ SOLN
INTRAMUSCULAR | Status: AC
  Filled 2021-11-13: qty 30

## 2021-11-13 MED ORDER — SODIUM CHLORIDE 0.9 % IV SOLN: INTRAVENOUS | Status: DC | PRN

## 2021-11-13 MED ORDER — DEXAMETHASONE SODIUM PHOSPHATE 10 MG/ML IJ SOLN
INTRAMUSCULAR | Status: AC
  Filled 2021-11-13: qty 1

## 2021-11-13 MED ORDER — FENTANYL 2500MCG IN NS 250ML (10MCG/ML) PREMIX INFUSION
0.0000 ug/h | INTRAVENOUS | Status: DC
  Administered 2021-11-13: 50 ug/h via INTRAVENOUS
  Filled 2021-11-13: qty 250

## 2021-11-13 MED ORDER — PHENYLEPHRINE 80 MCG/ML (10ML) SYRINGE FOR IV PUSH (FOR BLOOD PRESSURE SUPPORT)
PREFILLED_SYRINGE | INTRAVENOUS | Status: DC | PRN
  Administered 2021-11-13: 120 ug via INTRAVENOUS
  Administered 2021-11-13: 40 ug via INTRAVENOUS
  Administered 2021-11-13 (×3): 80 ug via INTRAVENOUS
  Administered 2021-11-13: 40 ug via INTRAVENOUS

## 2021-11-13 MED ORDER — ROCURONIUM BROMIDE 10 MG/ML (PF) SYRINGE
PREFILLED_SYRINGE | INTRAVENOUS | Status: DC | PRN
  Administered 2021-11-13 (×2): 40 mg via INTRAVENOUS
  Administered 2021-11-13: 50 mg via INTRAVENOUS
  Administered 2021-11-13: 30 mg via INTRAVENOUS
  Administered 2021-11-13: 20 mg via INTRAVENOUS
  Administered 2021-11-13: 30 mg via INTRAVENOUS
  Administered 2021-11-13: 80 mg via INTRAVENOUS
  Administered 2021-11-13 (×3): 30 mg via INTRAVENOUS
  Administered 2021-11-13: 50 mg via INTRAVENOUS

## 2021-11-13 MED ORDER — ALBUMIN HUMAN 5 % IV SOLN: INTRAVENOUS | Status: DC | PRN

## 2021-11-13 MED ORDER — ACETAMINOPHEN 500 MG PO TABS: 1000.0000 mg | ORAL_TABLET | Freq: Four times a day (QID) | ORAL | Status: DC

## 2021-11-13 MED ORDER — ACETAMINOPHEN 160 MG/5ML PO SOLN
1000.0000 mg | Freq: Four times a day (QID) | ORAL | Status: DC
  Administered 2021-11-13 – 2021-11-14 (×2): 1000 mg
  Filled 2021-11-13 (×3): qty 40.6

## 2021-11-13 MED ORDER — OXYCODONE HCL 5 MG PO TABS
5.0000 mg | ORAL_TABLET | ORAL | Status: DC | PRN
  Administered 2021-11-14: 5 mg via ORAL
  Administered 2021-11-14: 10 mg via ORAL
  Administered 2021-11-14: 5 mg via ORAL
  Administered 2021-11-14: 10 mg via ORAL
  Administered 2021-11-15: 5 mg via ORAL
  Administered 2021-11-15: 10 mg via ORAL
  Administered 2021-11-17: 5 mg via ORAL
  Administered 2021-11-17: 10 mg via ORAL
  Administered 2021-11-17 – 2021-11-18 (×2): 5 mg via ORAL
  Filled 2021-11-13: qty 1
  Filled 2021-11-13: qty 2
  Filled 2021-11-13: qty 1
  Filled 2021-11-13 (×2): qty 2
  Filled 2021-11-13 (×6): qty 1

## 2021-11-13 MED ORDER — PROPOFOL 1000 MG/100ML IV EMUL
0.0000 ug/kg/min | INTRAVENOUS | Status: DC
  Administered 2021-11-13: 20 ug/kg/min via INTRAVENOUS
  Administered 2021-11-14: 5 ug/kg/min via INTRAVENOUS
  Filled 2021-11-13 (×2): qty 100

## 2021-11-13 MED ORDER — SENNOSIDES-DOCUSATE SODIUM 8.6-50 MG PO TABS
1.0000 | ORAL_TABLET | Freq: Every day | ORAL | Status: DC
  Administered 2021-11-13: 1
  Filled 2021-11-13: qty 1

## 2021-11-13 MED ORDER — PHENYLEPHRINE HCL-NACL 20-0.9 MG/250ML-% IV SOLN
INTRAVENOUS | Status: DC | PRN
  Administered 2021-11-13: 20 ug/min via INTRAVENOUS

## 2021-11-13 SURGICAL SUPPLY — 92 items
ADH SKN CLS APL DERMABOND .7 (GAUZE/BANDAGES/DRESSINGS)
APPLIER CLIP ROT 10 11.4 M/L (STAPLE)
APR CLP MED LRG 11.4X10 (STAPLE)
BIT DRILL 7/64X5 DISP (BIT) IMPLANT
BLADE CLIPPER SURG (BLADE) ×3 IMPLANT
CANISTER SUCT 3000ML PPV (MISCELLANEOUS) ×6 IMPLANT
CATH THORACIC 28FR (CATHETERS) IMPLANT
CATH THORACIC 28FR RT ANG (CATHETERS) IMPLANT
CATH THORACIC 36FR (CATHETERS) IMPLANT
CATH THORACIC 36FR RT ANG (CATHETERS) IMPLANT
CLIP APPLIE ROT 10 11.4 M/L (STAPLE) IMPLANT
CLIP TI MEDIUM 24 (CLIP) ×1 IMPLANT
CLIP TI MEDIUM 6 (CLIP) ×2 IMPLANT
CLIP TI WIDE RED SMALL 24 (CLIP) ×1 IMPLANT
CLIP VESOCCLUDE MED 6/CT (CLIP) ×3 IMPLANT
CNTNR URN SCR LID CUP LEK RST (MISCELLANEOUS) IMPLANT
CONN ST 1/4X3/8  BEN (MISCELLANEOUS)
CONN ST 1/4X3/8 BEN (MISCELLANEOUS) IMPLANT
CONT SPEC 4OZ STRL OR WHT (MISCELLANEOUS) ×40
DERMABOND ADVANCED (GAUZE/BANDAGES/DRESSINGS)
DERMABOND ADVANCED .7 DNX12 (GAUZE/BANDAGES/DRESSINGS) IMPLANT
DRAIN CHANNEL 28F RND 3/8 FF (WOUND CARE) ×3 IMPLANT
DRESSING MEPILEX FLEX 4X4 (GAUZE/BANDAGES/DRESSINGS) IMPLANT
DRSG AQUACEL AG ADV 3.5X14 (GAUZE/BANDAGES/DRESSINGS) ×1 IMPLANT
DRSG MEPILEX FLEX 4X4 (GAUZE/BANDAGES/DRESSINGS) ×2
ELECT BLADE 6.5 EXT (BLADE) ×3 IMPLANT
ELECT REM PT RETURN 9FT ADLT (ELECTROSURGICAL) ×2
ELECTRODE REM PT RTRN 9FT ADLT (ELECTROSURGICAL) ×2 IMPLANT
FELT TEFLON 1X6 (MISCELLANEOUS) ×4 IMPLANT
GAUZE 4X4 16PLY ~~LOC~~+RFID DBL (SPONGE) ×2 IMPLANT
GAUZE SPONGE 4X4 12PLY STRL (GAUZE/BANDAGES/DRESSINGS) ×3 IMPLANT
GLOVE SURG SIGNA 7.5 PF LTX (GLOVE) ×8 IMPLANT
GOWN STRL REUS W/ TWL LRG LVL3 (GOWN DISPOSABLE) ×4 IMPLANT
GOWN STRL REUS W/ TWL XL LVL3 (GOWN DISPOSABLE) ×2 IMPLANT
GOWN STRL REUS W/TWL LRG LVL3 (GOWN DISPOSABLE) ×4
GOWN STRL REUS W/TWL XL LVL3 (GOWN DISPOSABLE) ×2
HEMOSTAT SURGICEL 2X14 (HEMOSTASIS) ×1 IMPLANT
INSERT FOGARTY 61MM (MISCELLANEOUS) IMPLANT
KIT BASIN OR (CUSTOM PROCEDURE TRAY) ×4 IMPLANT
KIT SUCTION CATH 14FR (SUCTIONS) ×3 IMPLANT
KIT TURNOVER KIT B (KITS) ×3 IMPLANT
NS IRRIG 1000ML POUR BTL (IV SOLUTION) ×9 IMPLANT
PACK CHEST (CUSTOM PROCEDURE TRAY) ×3 IMPLANT
PAD ARMBOARD 7.5X6 YLW CONV (MISCELLANEOUS) ×6 IMPLANT
PASSER SUT SWANSON 36MM LOOP (INSTRUMENTS) ×1 IMPLANT
POWDER SURGICEL 3.0 GRAM (HEMOSTASIS) ×1 IMPLANT
RELOAD STAPLE 45 3.6 BLU REG (STAPLE) IMPLANT
RELOAD STAPLE 45 4.1 GRN THCK (STAPLE) IMPLANT
RELOAD STAPLE 45 BLK VRY/THCK (STAPLE) IMPLANT
SEALANT SURG COSEAL 8ML (VASCULAR PRODUCTS) IMPLANT
SOL ANTI FOG 6CC (MISCELLANEOUS) IMPLANT
SOLUTION ANTI FOG 6CC (MISCELLANEOUS) ×1
SPONGE INTESTINAL PEANUT (DISPOSABLE) ×2 IMPLANT
SPONGE T-LAP 18X18 ~~LOC~~+RFID (SPONGE) ×11 IMPLANT
SPONGE T-LAP 4X18 ~~LOC~~+RFID (SPONGE) ×2 IMPLANT
SPONGE TONSIL TAPE 1 RFD (DISPOSABLE) ×3 IMPLANT
STAPLE RELOAD 45 GRN (STAPLE) ×20 IMPLANT
STAPLE RELOAD 45MM BLK (STAPLE) ×4 IMPLANT
STAPLE RELOAD 45MM BLUE (STAPLE) ×6 IMPLANT
STAPLE RELOAD 45MM GREEN (STAPLE) ×40
STAPLER ECHELON POWERED (MISCELLANEOUS) ×1 IMPLANT
STAPLER VISISTAT 35W (STAPLE) IMPLANT
STOPCOCK 4 WAY LG BORE MALE ST (IV SETS) ×3 IMPLANT
SUT PDS AB 1 CTX 36 (SUTURE) ×4 IMPLANT
SUT PDS AB 2-0 CT1 27 (SUTURE) ×1 IMPLANT
SUT PROLENE 1 XLH (SUTURE) ×4 IMPLANT
SUT PROLENE 2 0 MH 48 (SUTURE) IMPLANT
SUT PROLENE 2 0 SH DA (SUTURE) IMPLANT
SUT PROLENE 3 0 SH 48 (SUTURE) IMPLANT
SUT PROLENE 4 0 RB 1 (SUTURE) ×2
SUT PROLENE 4 0 SH DA (SUTURE) IMPLANT
SUT PROLENE 4-0 RB1 .5 CRCL 36 (SUTURE) ×2 IMPLANT
SUT PROLENE 5 0 C 1 36 (SUTURE) ×1 IMPLANT
SUT SILK  1 MH (SUTURE) ×6
SUT SILK 1 MH (SUTURE) ×4 IMPLANT
SUT SILK 2 0SH CR/8 30 (SUTURE) ×3 IMPLANT
SUT SILK 3 0SH CR/8 30 (SUTURE) ×1 IMPLANT
SUT VIC AB 1 CTX 18 (SUTURE) ×1 IMPLANT
SUT VIC AB 1 CTX 36 (SUTURE)
SUT VIC AB 1 CTX36XBRD ANBCTR (SUTURE) ×2 IMPLANT
SUT VIC AB 3-0 X1 27 (SUTURE) ×3 IMPLANT
SUT VICRYL 2 TP 1 (SUTURE) ×3 IMPLANT
SWAB CULTURE ESWAB REG 1ML (MISCELLANEOUS) IMPLANT
SYR 10ML LL (SYRINGE) ×3 IMPLANT
SYR 20ML LL LF (SYRINGE) IMPLANT
SYR 50ML LL SCALE MARK (SYRINGE) ×3 IMPLANT
SYSTEM SAHARA CHEST DRAIN ATS (WOUND CARE) ×3 IMPLANT
TAPE CLOTH SURG 4X10 WHT LF (GAUZE/BANDAGES/DRESSINGS) ×1 IMPLANT
TOWEL GREEN STERILE (TOWEL DISPOSABLE) ×6 IMPLANT
TRAY FOLEY SLVR 16FR LF STAT (SET/KITS/TRAYS/PACK) ×3 IMPLANT
TUBING EXTENTION W/L.L. (IV SETS) IMPLANT
WATER STERILE IRR 1000ML POUR (IV SOLUTION) ×3 IMPLANT

## 2021-11-13 NOTE — Brief Op Note (Addendum)
11/13/2021  5:39 PM  PATIENT:  Mathew [name]  61 y.o. male  PRE-OPERATIVE DIAGNOSIS:  RECURRENT RIGHT PLEURAL TUMOR  POST-OPERATIVE DIAGNOSIS:  RECURRENT RIGHT PLEURAL TUMORS  PROCEDURE:   REDO THORACOTOMY (Right) RESECTION OF PLEURAL TUMORS (Right)  SURGEON:      [name], MD - Primary  PHYSICIAN ASSISTANT: Enid Cutter, PA  ASSISTANTS: Murlean Caller, RN, Scrub Person   ANESTHESIA:   general  EBL:  4300 mL   BLOOD ADMINISTERED: 2 units PRBC's  DRAINS:  Right 26f Blake pleural tubes x 3    LOCAL MEDICATIONS USED:  NONE  SPECIMEN:  multiple right pleural tumors and lymph nodes  DISPOSITION OF SPECIMEN:  PATHOLOGY  COUNTS:  Correct  DICTATION: .Dragon Dictation  PLAN OF CARE: Admit to inpatient   PATIENT DISPOSITION:  ICU - intubated and hemodynamically stable.   Delay start of Pharmacological VTE agent (>24hrs) due to surgical blood loss or risk of bleeding: no  FINDINGS- severe adhesions, Tumors too numerous to count.

## 2021-11-13 NOTE — Interval H&P Note (Signed)
History and Physical Interval Note:  11/13/2021 7:51 AM  Mathew [name]  has presented today for surgery, with the diagnosis of RECURRENT RIGHT PLEURAL TUMOR.  The various methods of treatment have been discussed with the patient and family. After consideration of risks, benefits and other options for treatment, the patient has consented to  Procedure(s): REDO THORACOTOMY (Right) RESECTION OF PLEURAL TUMORS (Right) as a surgical intervention.  The patient's history has been reviewed, patient examined, no change in status, stable for surgery.  I have reviewed the patient's chart and labs.  Questions were answered to the patient's satisfaction.     [name]

## 2021-11-13 NOTE — Anesthesia Procedure Notes (Signed)
Procedure Name: Intubation Date/Time: 11/13/2021 7:49 AM Performed by: Betha Loa, CRNA Pre-anesthesia Checklist: Patient identified, Emergency Drugs available, Suction available and Patient being monitored Patient Re-evaluated:Patient Re-evaluated prior to induction Oxygen Delivery Method: Circle System Utilized Preoxygenation: Pre-oxygenation with 100% oxygen Induction Type: IV induction Ventilation: Mask ventilation without difficulty Laryngoscope Size: Mac and 4 Grade View: Grade I Tube type: Oral Endobronchial tube: Left, Double lumen EBT and EBT position confirmed by fiberoptic bronchoscope and 39 Fr Number of attempts: 1 Airway Equipment and Method: Stylet and Oral airway Placement Confirmation: ETT inserted through vocal cords under direct vision, positive ETCO2 and breath sounds checked- equal and bilateral Tube secured with: Tape Dental Injury: Teeth and Oropharynx as per pre-operative assessment

## 2021-11-13 NOTE — Anesthesia Procedure Notes (Signed)
Arterial Line Insertion Start/End5/31/2023 7:10 AM, 11/13/2021 7:15 AM Performed by: Betha Loa, CRNA, CRNA  Patient location: Pre-op. Preanesthetic checklist: patient identified, IV checked, site marked, risks and benefits discussed, surgical consent, monitors and equipment checked, pre-op evaluation, timeout performed and anesthesia consent Lidocaine 1% used for infiltration Left, radial was placed Catheter size: 20 G Hand hygiene performed , maximum sterile barriers used  and Seldinger technique used  Attempts: 1 Procedure performed without using ultrasound guided technique. Following insertion, Biopatch and dressing applied. Post procedure assessment: normal  Patient tolerated the procedure well with no immediate complications.

## 2021-11-13 NOTE — Progress Notes (Signed)
Spoke with Susie Cassette regarding PIV placement for vasopressor drip. Declined further IV access at this time. Fran Lowes, RN VAST

## 2021-11-13 NOTE — Anesthesia Preprocedure Evaluation (Addendum)
Anesthesia Evaluation  Patient identified by MRN, date of birth, ID band Patient awake    Reviewed: Allergy & Precautions, NPO status , Patient's Chart, lab work & pertinent test results  History of Anesthesia Complications Negative for: history of anesthetic complications  Airway Mallampati: II  TM Distance: >3 FB Neck ROM: Full    Dental  (+) Teeth Intact, Dental Advisory Given   Pulmonary shortness of breath, asthma ,    breath sounds clear to auscultation       Cardiovascular hypertension, Pt. on medications (-) angina(-) Past MI and (-) CHF  Rhythm:Regular     Neuro/Psych negative neurological ROS  negative psych ROS   GI/Hepatic Neg liver ROS, GERD  ,  Endo/Other  negative endocrine ROS  Renal/GU negative Renal ROS     Musculoskeletal  (+) Arthritis ,   Abdominal   Peds  Hematology  (+) Blood dyscrasia, anemia , Lab Results      Component                Value               Date                      WBC                      6.0                 11/12/2021                HGB                      12.1 (L)            11/12/2021                HCT                      37.1 (L)            11/12/2021                MCV                      98.4                11/12/2021                PLT                      230                 11/12/2021              Anesthesia Other Findings   Reproductive/Obstetrics                             Anesthesia Physical Anesthesia Plan  ASA: 2  Anesthesia Plan: General   Post-op Pain Management: Ofirmev IV (intra-op)*   Induction: Intravenous  PONV Risk Score and Plan: 2 and Ondansetron and Dexamethasone  Airway Management Planned: Double Lumen EBT  Additional Equipment: Arterial line  Intra-op Plan:   Post-operative Plan: Extubation in OR  Informed Consent: I have reviewed the patients History and Physical, chart, labs and discussed the  procedure including the risks, benefits and alternatives for the proposed anesthesia with  the patient or authorized representative who has indicated his/her understanding and acceptance.     Dental advisory given  Plan Discussed with: CRNA  Anesthesia Plan Comments:        Anesthesia Quick Evaluation

## 2021-11-13 NOTE — Transfer of Care (Signed)
Immediate Anesthesia Transfer of Care Note  Patient: Mathew [name]  Procedure(s) Performed: REDO THORACOTOMY (Right) RESECTION OF PLEURAL TUMORS (Right)  Patient Location: PACU and ICU  Anesthesia Type:General  Level of Consciousness: sedated and Patient remains intubated per anesthesia plan  Airway & Oxygen Therapy: Patient remains intubated per anesthesia plan and Patient placed on Ventilator (see vital sign flow sheet for setting)  Post-op Assessment: Report given to RN and Post -op Vital signs reviewed and stable  Post vital signs: Reviewed and stable  Last Vitals:  Vitals Value Taken Time  BP 127/69 11/13/21 1845  Temp 36.5 C 11/13/21 1820  Pulse 53 11/13/21 1840  Resp 22 11/13/21 1852  SpO2 100 % 11/13/21 1840  Vitals shown include unvalidated device data.  Last Pain:  Vitals:   11/13/21 1820  TempSrc: Axillary  PainSc:          Complications: No notable events documented.

## 2021-11-13 NOTE — Anesthesia Procedure Notes (Signed)
Procedure Name: Intubation Date/Time: 11/13/2021 5:52 PM Performed by: Trinna Post., CRNA Pre-anesthesia Checklist: Patient identified, Emergency Drugs available, Suction available, Patient being monitored and Timeout performed Patient Re-evaluated:Patient Re-evaluated prior to induction Oxygen Delivery Method: Circle system utilized Preoxygenation: Pre-oxygenation with 100% oxygen Induction Type: IV induction Laryngoscope Size: Mac and 4 Grade View: Grade I Tube type: Oral Tube size: 8.0 mm Number of attempts: 1 Airway Equipment and Method: Bougie stylet Placement Confirmation: ETT inserted through vocal cords under direct vision, positive ETCO2 and breath sounds checked- equal and bilateral Secured at: 23 cm Tube secured with: Tape Dental Injury: Teeth and Oropharynx as per pre-operative assessment  Comments: DLT exchanged for 8.0 ETT with use of cook catheter. No adverse events, positive EtCO2 and bilateral lung sounds. All VSS and will continue to monitor

## 2021-11-14 ENCOUNTER — Inpatient Hospital Stay (HOSPITAL_COMMUNITY): Payer: 59

## 2021-11-14 ENCOUNTER — Encounter (HOSPITAL_COMMUNITY): Payer: Self-pay | Admitting: Thoracic Surgery (Cardiothoracic Vascular Surgery)

## 2021-11-14 LAB — PREPARE FRESH FROZEN PLASMA
Unit division: 0
Unit division: 0

## 2021-11-14 LAB — BPAM FFP
Blood Product Expiration Date: 202306042359
Blood Product Expiration Date: 202306052359
ISSUE DATE / TIME: 202306011231
ISSUE DATE / TIME: 202306011231
Unit Type and Rh: 6200
Unit Type and Rh: 6200

## 2021-11-14 LAB — CBC
HCT: 30.5 % — ABNORMAL LOW (ref 39.0–52.0)
Hemoglobin: 9.9 g/dL — ABNORMAL LOW (ref 13.0–17.0)
MCH: 31 pg (ref 26.0–34.0)
MCHC: 32.5 g/dL (ref 30.0–36.0)
MCV: 95.6 fL (ref 80.0–100.0)
Platelets: 212 10*3/uL (ref 150–400)
RBC: 3.19 MIL/uL — ABNORMAL LOW (ref 4.22–5.81)
RDW: 14.4 % (ref 11.5–15.5)
WBC: 17.3 10*3/uL — ABNORMAL HIGH (ref 4.0–10.5)
nRBC: 0 % (ref 0.0–0.2)

## 2021-11-14 LAB — POCT I-STAT 7, (LYTES, BLD GAS, ICA,H+H)
Acid-base deficit: 3 mmol/L — ABNORMAL HIGH (ref 0.0–2.0)
Bicarbonate: 24.5 mmol/L (ref 20.0–28.0)
Calcium, Ion: 1.3 mmol/L (ref 1.15–1.40)
HCT: 30 % — ABNORMAL LOW (ref 39.0–52.0)
Hemoglobin: 10.2 g/dL — ABNORMAL LOW (ref 13.0–17.0)
O2 Saturation: 99 %
Patient temperature: 98.3
Potassium: 4.8 mmol/L (ref 3.5–5.1)
Sodium: 137 mmol/L (ref 135–145)
TCO2: 26 mmol/L (ref 22–32)
pCO2 arterial: 52.6 mmHg — ABNORMAL HIGH (ref 32–48)
pH, Arterial: 7.276 — ABNORMAL LOW (ref 7.35–7.45)
pO2, Arterial: 167 mmHg — ABNORMAL HIGH (ref 83–108)

## 2021-11-14 LAB — BASIC METABOLIC PANEL
Anion gap: 6 (ref 5–15)
BUN: 21 mg/dL (ref 8–23)
CO2: 21 mmol/L — ABNORMAL LOW (ref 22–32)
Calcium: 8 mg/dL — ABNORMAL LOW (ref 8.9–10.3)
Chloride: 109 mmol/L (ref 98–111)
Creatinine, Ser: 1.5 mg/dL — ABNORMAL HIGH (ref 0.61–1.24)
GFR, Estimated: 52 mL/min — ABNORMAL LOW (ref >60)
Glucose, Bld: 186 mg/dL — ABNORMAL HIGH (ref 70–99)
Potassium: 4.6 mmol/L (ref 3.5–5.1)
Sodium: 136 mmol/L (ref 135–145)

## 2021-11-14 LAB — TRIGLYCERIDES: Triglycerides: 37 mg/dL (ref <150)

## 2021-11-14 LAB — MAGNESIUM: Magnesium: 1.4 mg/dL — ABNORMAL LOW (ref 1.7–2.4)

## 2021-11-14 LAB — GLUCOSE, CAPILLARY: Glucose-Capillary: 129 mg/dL — ABNORMAL HIGH (ref 70–99)

## 2021-11-14 MED ORDER — ACETAMINOPHEN 160 MG/5ML PO SOLN: 1000.0000 mg | Freq: Four times a day (QID) | ORAL | Status: DC

## 2021-11-14 MED ORDER — GABAPENTIN 300 MG PO CAPS
300.0000 mg | ORAL_CAPSULE | Freq: Two times a day (BID) | ORAL | Status: DC
Start: 2021-11-14 — End: 2021-11-18
  Administered 2021-11-14 – 2021-11-17 (×8): 300 mg via ORAL
  Filled 2021-11-14 (×8): qty 1

## 2021-11-14 MED ORDER — MAGNESIUM SULFATE 2 GM/50ML IV SOLN
2.0000 g | Freq: Once | INTRAVENOUS | Status: AC
  Administered 2021-11-14: 2 g via INTRAVENOUS
  Filled 2021-11-14: qty 50

## 2021-11-14 MED ORDER — SENNOSIDES-DOCUSATE SODIUM 8.6-50 MG PO TABS
1.0000 | ORAL_TABLET | Freq: Every day | ORAL | Status: DC
  Administered 2021-11-14 – 2021-11-19 (×6): 1 via ORAL
  Filled 2021-11-14 (×6): qty 1

## 2021-11-14 MED ORDER — ACETAMINOPHEN 500 MG PO TABS
1000.0000 mg | ORAL_TABLET | Freq: Four times a day (QID) | ORAL | Status: DC
  Administered 2021-11-14 – 2021-11-15 (×4): 1000 mg via ORAL
  Filled 2021-11-14 (×4): qty 2

## 2021-11-14 MED ORDER — ORAL CARE MOUTH RINSE
15.0000 mL | Freq: Two times a day (BID) | OROMUCOSAL | Status: DC
  Administered 2021-11-14 (×2): 15 mL via OROMUCOSAL

## 2021-11-14 NOTE — Discharge Instructions (Signed)

## 2021-11-14 NOTE — Progress Notes (Signed)
  Transition of Care [facility]) Screening Note   Patient Details  Name: RONALD LONDO Date of Birth: May 20, 1961   Transition of Care [facility]) CM/SW Contact:    Milas Gain, [place] Phone Number: 11/14/2021, 3:34 PM    Transition of Care Department [facility]) has reviewed patient and no TOC needs have been identified at this time. We will continue to monitor patient advancement through interdisciplinary progression rounds. If new patient transition needs arise, please place a TOC consult.

## 2021-11-14 NOTE — Op Note (Signed)
NAME: LEONARD, FEIGEL. MEDICAL RECORD NO: [id number] ACCOUNT NO: [id number] DATE OF BIRTH: 15-Oct-1960 FACILITY: [facility] LOCATION: [facility] PHYSICIAN: Revonda Standard. Roxan Hockey, MD  Operative Report   DATE OF PROCEDURE: 11/13/2021  PREOPERATIVE DIAGNOSIS:  Recurrent right pleural tumors.  POSTOPERATIVE DIAGNOSIS:  Recurrent right pleural tumors.  PROCEDURE:  Redo right thoracotomy with resection of pleural tumors and lymph node dissection.  SURGEON:  Revonda Standard. Roxan Hockey, MD  ASSISTANT:  Enid Cutter, PA  ANESTHESIA:  General.  FINDINGS:  Loculated pleural effusions, numerous tumors and lymphadenopathy. Pleural tumors too numerous to count, multiple abnormal-appearing lymph nodes.  CLINICAL NOTE: Mathew [name] is a 61 year old gentleman with a history of pleural tumors.  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  2 years ago he developed pleural effusion and had multiple pleural tumors all which appeared malignant.  He underwent redo surgery for resection.  He now presents with recurrent pleural tumors, which have increased in size despite multiple different chemotherapeutic agents.  He was offered surgical resection in hopes of possible curative resection or debulking. The  indications, risks, benefits, and alternatives were discussed in detail with the patient.  He understood and accepted the risks and agreed to proceed.  OPERATIVE NOTE: Mr. Borges was brought to the operating room on 11/13/2021.  He had a central line placement and an arterial line placement by anesthesia in the preoperative holding area.  In the operating room, he was anesthetized and intubated with a  double lumen endotracheal tube.  Intravenous antibiotics were administered.  A Foley catheter was placed.  Sequential compression devices were placed on the calves for DVT prophylaxis.  He was placed in a left lateral decubitus position and the right  chest was prepped and draped in the usual  sterile fashion.  Single lung ventilation of the left lung was initiated and was tolerated well throughout the procedure.  A timeout was performed.  An incision was made through the patient's previous scar.  It was carried through the skin and subcutaneous tissue.  The latissimus muscle was divided.  The serratus muscle was mobilized and retracted anteriorly.  The sixth and  seventh ribs were essentially fused, so incision was made through the fifth interspace and the fifth rib was divided posteriorly.  On entering the chest, the approach was directly over the fissure.  There were no significant adhesions in the fissure.   There was some fluid and there were multiple small tumors arising from the visceral pleura in the fissure.  These were removed later during the procedure.  There were extensive adhesions around the incision.  A great deal of time was spent mobilizing the  lung.  There were adhesions in various areas.  There were multiple pleural tumors, which were too numerous to count. In the fissure, there was abnormal-appearing level 12 node.  This was removed and sent for frozen section.  It showed a malignancy with  a suspicion of possible lymphoma, although it could not be differentiated from his previous tumor based on frozen section. In addition to that node, a level 10 node was markedly abnormal and was removed.  One of the masses seen on the CT was a large level  8 paratracheal node which was removed. Level 7 nodes also were removed. There also were some nodules along the pleura that appeared consistent with abnormal lymph nodes.  All the remaining specimens were sent for routine pathology.  This was a long procedure  lasting approximately 8 hours.  After all grossly visible disease had been removed, the chest was copiously irrigated.  It should be noted with removal of disease, there was an area of the lower lobe inferiorly which had a tumor that appeared to be  intraparenchymal and a wedge  resection was performed of that area.  There was good reexpansion of all 3 lobes. With a test inflation, there were air leaks.  Chest was copiously irrigated with saline.  Three 28-French Blake drains were placed through  separate incisions.  2 tubes were placed anteriorly and posteriorly directed towards the apex and then one along the diaphragm.  All were secured to the skin with #1 silk sutures. Thoracotomy then was closed in standard fashion.  The patient was placed back in supine  position.  He was extubated in the operating room.  The chest tubes were placed to a Pleur-Evac on suction.  The patient was placed back in supine position.  He was reintubated with a single lumen endotracheal tube with a plan to take to the ICU  intubated overnight.  He did receive 2 units of packed red blood cells during the procedure.  All sponge, needle and instrument counts were correct at the end of the procedure.  Experienced assistance was necessary for this case due to surgical complexity.  Enid Cutter, PA served as the assistant providing exposure, retraction of delicate tissues, suctioning and assisting with wound closure.   NIK D: 11/14/2021 8:16:52 pm T: 11/14/2021 11:40:00 pm  JOB: [id number]/ [id number]

## 2021-11-14 NOTE — Progress Notes (Signed)
Attempted to dangle patient. Patient had syncopal episode w/ emesis. Patient was cleaned and returned to bed. Patients vital signs are stable. Chest X-ray done. Dr. Roxan Hockey was made aware.

## 2021-11-14 NOTE — Hospital Course (Addendum)
History of Present Illness: Mathew [name] is a 61 year old man with a history of recurrent fibrous tumors of the pleura, asthma, arthritis, and reflux.  He originally presented with exertional shortness of breath in 2017.  He exercises vigorously and noted that towards the end of his workouts he would have more trouble breathing.  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  He underwent a right thoracotomy and resection of that.  4 years later he presented with dyspnea and was found to have a large right pleural effusion and multiple pleural masses.  I did a redo and resected multiple masses and drained the effusion.  The pleural tumors had multiple different histologies with some appearing malignant.  He has been followed by Dr. Julien [name] and Dr. Kendall [name] at [facility] since then.  In October 2022 CT showed a new 1.4 cm pleural nodule.  He was started on sunitinib.  Follow-up CT 3 months later showed progression.  He was then changed to as pazopanib.  He tolerated that treatment well but his CT again showed progression of disease.  Since the past 2 weeks he started noticed some shortness of breath with heavy exertion.  No chest pain, pressure, or tightness. No peripheral edema.  He otherwise feels well.  We again discussed therapeutic options.  His disease is relatively limited and is treatable although I am not sure it is curable.  He has not responded to systemic therapy.  Options would be radiation versus surgical resection.  I think a surgical resection gives him a better possibility of a cure, albeit very limited.  We discussed doing a redo right thoracotomy for resection of pleural tumors.  He is familiar with the procedure having undergone similar procedures 2 times previously.  He does know that there is a high risk of complications with repeated surgery due to scarring from previous operations.  I informed him of the indications, risks, benefits, and alternatives.  He understands  the risks include, but not limited to death, MI, DVT, PE, bleeding, possible need for transfusion, infection, air leaks, as well as possibility of other unforeseeable complications.  He wishes to proceed.   Hospital Course: Mathew [name] was admitted for elective surgery on 11/13/2021.  He was taken to the operating room where redo right thoracotomy was performed with extensive lysis of adhesions followed by resection of innumerable tumors of varying size.  He developed atrial fibrillation during the course of the operation while dissecting near the pericardium and was loaded with amiodarone.  He remained hemodynamically stable throughout the operation.  He was transfused with 2 units of packed red blood cells during the course of the procedure.  Following the operation, he was transferred to the intensive care unit intubated and sedated.  He remained stable.  He had an active air leak as expected given the extensive dissection required.  On postop day 1, he was awake and alert and was completely responsive and cooperative.  The ventilator was weaned for extubation.  He was in a sinus rhythm with bradycardia ranging from rate of 45-60.  Chest tube drainage tapered off.  We left the chest tubes on suction to optimize expansion of the right lung and to promote resolution of the air leaks. He had no further atrial fibrillation so the amiodarone was discontinued.  The air leak and chest tube drainage gradually slowed.  Chest x-ray showed resolution of the right apical pneumothorax by the fourth postoperative day.  He had expected acute blood  loss anemia with his hemoglobin dropping to 6.5 on postop day 3 so he was transfused with 1 unit of packed red blood cells.  Follow-up CBC showed an appropriate rise in hemoglobin and hematocrit.

## 2021-11-14 NOTE — Procedures (Signed)
Extubation Procedure Note  Patient Details:   Name: Mathew [name] DOB: Apr 11, 1961 MRN: [id number]   Airway Documentation:    Vent end date: 11/14/21 Vent end time: 0824   Evaluation  O2 sats: stable throughout Complications: No apparent complications Patient did tolerate procedure well. Bilateral Breath Sounds: Rhonchi   Yes  Patient extubated per order to 4L [name] with no apparent complications. Positive cuff leak was noted prior to extubation. Patient is alert and oriented and is able to weakly speak. Vitals are stable. RT will continue to monitor,   Shavonte Zhao Clyda Greener 11/14/2021, 8:30 AM

## 2021-11-14 NOTE — Progress Notes (Addendum)
[facility] DAILY ICU PROGRESS NOTE                   [address]            [place] [address]          [phone]   1 Day Post-Op Procedure(s) (LRB): REDO THORACOTOMY (Right) RESECTION OF PLEURAL TUMORS (Right)  Total Length of Stay:  LOS: 1 day   Subjective: On vent but sitting up. Awake and communicating with written notes.   Amiodarone Neo Synephrine  Objective: Vital signs in last 24 hours: Temp:  [97.7 F (36.5 C)-98.3 F (36.8 C)] 98.3 F (36.8 C) (06/01 0400) Pulse Rate:  [49-60] 50 (06/01 0716) Cardiac Rhythm: Sinus bradycardia (06/01 0400) Resp:  [13-26] 21 (06/01 0716) BP: (104-130)/(45-87) 109/87 (06/01 0716) SpO2:  [100 %] 100 % (06/01 0716) Arterial Line BP: (95-131)/(42-61) 109/49 (06/01 0700) FiO2 (%):  [40 %-100 %] 40 % (06/01 0716) Weight:  [80.2 kg] 80.2 kg (06/01 0500)  Filed Weights   11/13/21 0605 11/14/21 0500  Weight: 81.2 kg 80.2 kg    Weight change: -0.994 kg     Intake/Output from previous day: 05/31 0701 - 06/01 0700 In: 7748.7 [I.V.:5808.6; Blood:630; NG/GT:60; IV Piggyback:1250.1] Out: 7017 [Urine:765; Blood:4300; Chest Tube:1020]  Intake/Output this shift: No intake/output data recorded.  Current Meds: Scheduled Meds:  sodium chloride   Intravenous Once   acetaminophen  1,000 mg Per Tube Q6H   Or   acetaminophen (TYLENOL) oral liquid 160 mg/5 mL  1,000 mg Per Tube Q6H   bisacodyl  10 mg Oral Daily   chlorhexidine gluconate (MEDLINE KIT)  15 mL Mouth Rinse BID   Chlorhexidine Gluconate Cloth  6 each Topical Daily   enoxaparin (LOVENOX) injection  40 mg Subcutaneous Daily   gabapentin  300 mg Per Tube Q12H   mouth rinse  15 mL Mouth Rinse 10 times per day   senna-docusate  1 tablet Per Tube QHS   Continuous Infusions:  sodium chloride     sodium chloride 75 mL/hr at 11/14/21 0700   sodium chloride 10 mL/hr at 11/14/21 0700   amiodarone 30 mg/hr (11/14/21 0700)   fentaNYL infusion INTRAVENOUS Stopped  (11/14/21 0606)   phenylephrine (NEO-SYNEPHRINE) Adult infusion 80 mcg/min (11/14/21 0700)   propofol (DIPRIVAN) infusion Stopped (11/14/21 0510)   PRN Meds:.sodium chloride, albuterol, morphine injection, ondansetron (ZOFRAN) IV, oxyCODONE, traMADol  General appearance: alert, cooperative, and no distress Neurologic: intact Heart: SR, SB Lungs: Clear breath sounds on the left, coarse on the right. Active air leak.  Abdomen: soft, no tenderness, absent bowel sounds Extremities: warm and well perfused, no significant edema.  Wound: Chest dressing is dry.  Lab Results: CBC: Recent Labs    11/13/21 2013 11/14/21 0319 11/14/21 0653  WBC 17.3* 17.3*  --   HGB 11.0* 9.9* 10.2*  HCT 32.3* 30.5* 30.0*  PLT 193 212  --    BMET:  Recent Labs    11/13/21 2013 11/14/21 0319 11/14/21 0653  NA 135 136 137  K 5.5* 4.6 4.8  CL 107 109  --   CO2 21* 21*  --   GLUCOSE 271* 186*  --   BUN 15 21  --   CREATININE 1.01 1.50*  --   CALCIUM 7.6* 8.0*  --     CMET: Lab Results  Component Value Date   WBC 17.3 (H) 11/14/2021   HGB 10.2 (L) 11/14/2021   HCT 30.0 (L) 11/14/2021   PLT  212 11/14/2021   GLUCOSE 186 (H) 11/14/2021   TRIG 37 11/14/2021   ALT 19 11/12/2021   AST 19 11/12/2021   NA 137 11/14/2021   K 4.8 11/14/2021   CL 109 11/14/2021   CREATININE 1.50 (H) 11/14/2021   BUN 21 11/14/2021   CO2 21 (L) 11/14/2021   INR 1.2 11/12/2021      PT/INR:  Recent Labs    11/12/21 1100  LABPROT 15.2  INR 1.2   Radiology: DG CHEST PORT 1 VIEW  Result Date: 11/13/2021 CLINICAL DATA:  Status post thoracotomy. EXAM: PORTABLE CHEST 1 VIEW COMPARISON:  Chest x-ray 11/12/2021 FINDINGS: Endotracheal tube tip is 4 cm above the carina. Nasogastric tube tip is in the stomach. Right-sided chest tubes are present, new from prior examination. Surgical staple lines are seen in the mid and lower right hemithorax. There also surgical clips in the right lung apex. There continues to be volume  loss on the right. Small right pneumothorax is present, partially loculated in the lung base. There are patchy densities in the right mid and lower lung which may be postsurgical. There is a band of atelectasis in the left lung base. Cardiomediastinal silhouette is within normal limits. IMPRESSION: 1. New right-sided chest tubes in place. Small right hydropneumothorax, partially loculated inferiorly. 2. Patchy opacities and postsurgical changes in the right lower lung. Electronically Signed   By: Ronney Asters M.D.   On: 11/13/2021 19:10     Assessment/Plan: S/P Procedure(s) (LRB): REDO THORACOTOMY (Right) RESECTION OF PLEURAL TUMORS (Right)  -POD1 redo right thoracotomy for resection of multiple recurrent pleural tumors. Remained on mechanical vent overnight due to prolonged procedure. Awake , alert, and cooperative. Mild acidosis on ABG but good oxygenation. CXR stable.  Expect we can wean to extubate. Keep chest tubes to suction for 48hrs.   -Atrial fibrillation- onset during surgery wile dissecting near the pericardium. Amiodarone loaded, No further AF overnight. Currently sinus brady (Pre-op resting HR in the 50's).  -HEME-Expected acute blood loss anemia-2 unint PRBC's transfused intra-op. Hct stable at 30. Monitor.   -RENAL- normal function pre-op.  Mild increase in creat. Monitor.   -DVT PPX- SCD's, mbilize after extubation. [place] enoxaparin to begin this evening.   -Low Mg++-- replace.   Antony Odea, PA-C [phone] 11/14/2021 7:29 AM  Patient seen and examined, agree with above He is wide awake, denies pain Will extubate this AM Keep CT to suction today Will continue amiodarone IV today  Remo Lipps C. Roxan Hockey, MD [facility] [phone]

## 2021-11-14 NOTE — Anesthesia Postprocedure Evaluation (Signed)
Anesthesia Post Note  Patient: Mathew [name]  Procedure(s) Performed: REDO THORACOTOMY (Right) RESECTION OF PLEURAL TUMORS (Right)     Patient location during evaluation: SICU Anesthesia Type: General Level of consciousness: sedated Pain management: pain level controlled Vital Signs Assessment: post-procedure vital signs reviewed and stable Respiratory status: patient remains intubated per anesthesia plan Cardiovascular status: stable Postop Assessment: no apparent nausea or vomiting Anesthetic complications: no   No notable events documented.  Last Vitals:  Vitals:   11/14/21 0716 11/14/21 0717  BP: 109/87   Pulse: (!) 50   Resp: (!) 21   Temp:    SpO2: 100% 100%    Last Pain:  Vitals:   11/14/21 0607  TempSrc:   PainSc: 4                  [name] P Trellis Vanoverbeke

## 2021-11-14 NOTE — Addendum Note (Signed)
Addendum  created 11/14/21 1003 by Josephine Igo, CRNA   Order list changed, Pharmacy for encounter modified

## 2021-11-14 NOTE — Progress Notes (Signed)
      [address]       [place] [address]             [phone]      POD # 1  Events of this afternoon noted Vagal episode when he sat up with vomiting No nausea at present and wants to try again to get up BP 121/63   Pulse (!) 49   Temp 97.6 F (36.4 C) (Oral)   Resp 15   Ht 5\' 9"  (1.753 m)   Wt 80.2 kg   SpO2 94%   BMI 26.11 kg/m  On neo CT to suction with air leak  Intake/Output Summary (Last 24 hours) at 11/14/2021 1911 Last data filed at 11/14/2021 1900 Gross per 24 hour  Intake 3696.14 ml  Output 1820 ml  Net 1876.14 ml   Looks great Will try again to mobilize  [employer]. Roxan Hockey, MD [facility] [phone]

## 2021-11-15 ENCOUNTER — Inpatient Hospital Stay (HOSPITAL_COMMUNITY): Payer: 59

## 2021-11-15 LAB — CBC
HCT: 23.9 % — ABNORMAL LOW (ref 39.0–52.0)
Hemoglobin: 7.9 g/dL — ABNORMAL LOW (ref 13.0–17.0)
MCH: 31.5 pg (ref 26.0–34.0)
MCHC: 33.1 g/dL (ref 30.0–36.0)
MCV: 95.2 fL (ref 80.0–100.0)
Platelets: 152 10*3/uL (ref 150–400)
RBC: 2.51 MIL/uL — ABNORMAL LOW (ref 4.22–5.81)
RDW: 14.4 % (ref 11.5–15.5)
WBC: 15.8 10*3/uL — ABNORMAL HIGH (ref 4.0–10.5)
nRBC: 0.1 % (ref 0.0–0.2)

## 2021-11-15 LAB — SURGICAL PATHOLOGY

## 2021-11-15 LAB — COMPREHENSIVE METABOLIC PANEL
ALT: 64 U/L — ABNORMAL HIGH (ref 0–44)
AST: 126 U/L — ABNORMAL HIGH (ref 15–41)
Albumin: 1.7 g/dL — ABNORMAL LOW (ref 3.5–5.0)
Alkaline Phosphatase: 44 U/L (ref 38–126)
Anion gap: 4 — ABNORMAL LOW (ref 5–15)
BUN: 26 mg/dL — ABNORMAL HIGH (ref 8–23)
CO2: 25 mmol/L (ref 22–32)
Calcium: 7.8 mg/dL — ABNORMAL LOW (ref 8.9–10.3)
Chloride: 104 mmol/L (ref 98–111)
Creatinine, Ser: 1.3 mg/dL — ABNORMAL HIGH (ref 0.61–1.24)
GFR, Estimated: >60 mL/min (ref >60)
Glucose, Bld: 123 mg/dL — ABNORMAL HIGH (ref 70–99)
Potassium: 5.4 mmol/L — ABNORMAL HIGH (ref 3.5–5.1)
Sodium: 133 mmol/L — ABNORMAL LOW (ref 135–145)
Total Bilirubin: 0.2 mg/dL — ABNORMAL LOW (ref 0.3–1.2)
Total Protein: 3.9 g/dL — ABNORMAL LOW (ref 6.5–8.1)

## 2021-11-15 MED ORDER — FE FUMARATE-B12-VIT C-FA-IFC PO CAPS
1.0000 | ORAL_CAPSULE | Freq: Two times a day (BID) | ORAL | Status: DC
  Administered 2021-11-15 – 2021-11-20 (×11): 1 via ORAL
  Filled 2021-11-15 (×12): qty 1

## 2021-11-15 MED ORDER — ENOXAPARIN SODIUM 40 MG/0.4ML IJ SOSY
40.0000 mg | PREFILLED_SYRINGE | INTRAMUSCULAR | Status: DC
  Administered 2021-11-15 – 2021-11-19 (×5): 40 mg via SUBCUTANEOUS
  Filled 2021-11-15 (×5): qty 0.4

## 2021-11-15 MED ORDER — MORPHINE SULFATE (PF) 2 MG/ML IV SOLN: 2.0000 mg | INTRAVENOUS | Status: DC | PRN

## 2021-11-15 MED ORDER — SODIUM CHLORIDE 0.9% FLUSH: 3.0000 mL | INTRAVENOUS | Status: DC | PRN

## 2021-11-15 MED ORDER — SODIUM CHLORIDE 0.9 % IV SOLN: 250.0000 mL | INTRAVENOUS | Status: DC | PRN

## 2021-11-15 MED ORDER — MAGNESIUM SULFATE 2 GM/50ML IV SOLN
2.0000 g | Freq: Once | INTRAVENOUS | Status: DC
  Administered 2021-11-15: 2 g via INTRAVENOUS
  Filled 2021-11-15: qty 50

## 2021-11-15 MED ORDER — AMIODARONE HCL 200 MG PO TABS: 200.0000 mg | ORAL_TABLET | Freq: Two times a day (BID) | ORAL | Status: DC

## 2021-11-15 MED ORDER — SODIUM CHLORIDE 0.9% FLUSH
3.0000 mL | Freq: Two times a day (BID) | INTRAVENOUS | Status: DC
  Administered 2021-11-15 – 2021-11-20 (×10): 3 mL via INTRAVENOUS

## 2021-11-15 NOTE — Progress Notes (Addendum)
[facility] DAILY ICU PROGRESS NOTE                   [address]            ,[place] [address]          [phone]   2 Days Post-Op Procedure(s) (LRB): REDO THORACOTOMY (Right) RESECTION OF PLEURAL TUMORS (Right)  Total Length of Stay:  LOS: 2 days   Subjective: Up in the bedside chair eating breakfast. Denies nausea. Says pain is controlled with Tylenol. Has not walked in the hall yet.   Amiodarone at 0.5mg /min Neo Synephrine off  Objective: Vital signs in last 24 hours: Temp:  [97.6 F (36.4 C)-98 F (36.7 C)] 97.9 F (36.6 C) (06/02 0400) Pulse Rate:  [41-58] 50 (06/02 0600) Cardiac Rhythm: Sinus bradycardia (06/02 0600) Resp:  [6-41] 21 (06/02 0600) BP: (83-130)/(50-69) 111/56 (06/02 0600) SpO2:  [94 %-100 %] 99 % (06/02 0600) Arterial Line BP: (63-148)/(39-85) 134/58 (06/02 0600) FiO2 (%):  [36 %] 36 % (06/01 0826) Weight:  [80.9 kg] 80.9 kg (06/02 0600)  Filed Weights   11/13/21 0605 11/14/21 0500 11/15/21 0600  Weight: 81.2 kg 80.2 kg 80.9 kg    Weight change: 0.7 kg     Intake/Output from previous day: 06/01 0701 - 06/02 0700 In: 3339.9 [P.O.:480; I.V.:2810.1; IV Piggyback:49.8] Out: 2020 [Urine:990; Chest Tube:1030]  Intake/Output this shift: No intake/output data recorded.  Current Meds: Scheduled Meds:  sodium chloride   Intravenous Once   acetaminophen  1,000 mg Oral Q6H   Or   acetaminophen (TYLENOL) oral liquid 160 mg/5 mL  1,000 mg Per Tube Q6H   bisacodyl  10 mg Oral Daily   Chlorhexidine Gluconate Cloth  6 each Topical Daily   enoxaparin (LOVENOX) injection  40 mg Subcutaneous Daily   gabapentin  300 mg Oral BID   mouth rinse  15 mL Mouth Rinse BID   senna-docusate  1 tablet Oral QHS   Continuous Infusions:  sodium chloride     sodium chloride 75 mL/hr at 11/15/21 0600   sodium chloride 10 mL/hr at 11/15/21 0600   amiodarone 30 mg/hr (11/15/21 0600)   phenylephrine (NEO-SYNEPHRINE) Adult infusion Stopped (11/15/21  0629)   PRN Meds:.sodium chloride, albuterol, morphine injection, ondansetron (ZOFRAN) IV, oxyCODONE, traMADol  General appearance: alert, cooperative, and no distress Neurologic: intact Heart: SR, SB Lungs: good air movement bilat. Breath sounds clear. CT drained ~116ml overnight and ~48ml when he stood to pivot to chair this morning.  Abdomen: soft, no tenderness Extremities: warm and well perfused, no edema.  Wound: Chest dressing is dry.  Lab Results: CBC: Recent Labs    11/14/21 0319 11/14/21 0653 11/15/21 0527  WBC 17.3*  --  15.8*  HGB 9.9* 10.2* 7.9*  HCT 30.5* 30.0* 23.9*  PLT 212  --  152    BMET:  Recent Labs    11/14/21 0319 11/14/21 0653 11/15/21 0527  NA 136 137 133*  K 4.6 4.8 5.4*  CL 109  --  104  CO2 21*  --  25  GLUCOSE 186*  --  123*  BUN 21  --  26*  CREATININE 1.50*  --  1.30*  CALCIUM 8.0*  --  7.8*     CMET: Lab Results  Component Value Date   WBC 15.8 (H) 11/15/2021   HGB 7.9 (L) 11/15/2021   HCT 23.9 (L) 11/15/2021   PLT 152 11/15/2021   GLUCOSE 123 (H) 11/15/2021   TRIG 37  11/14/2021   ALT 64 (H) 11/15/2021   AST 126 (H) 11/15/2021   NA 133 (L) 11/15/2021   K 5.4 (H) 11/15/2021   CL 104 11/15/2021   CREATININE 1.30 (H) 11/15/2021   BUN 26 (H) 11/15/2021   CO2 25 11/15/2021   INR 1.2 11/12/2021      PT/INR:  Recent Labs    11/12/21 1100  LABPROT 15.2  INR 1.2    Radiology: DG CHEST PORT 1 VIEW  Result Date: 11/14/2021 CLINICAL DATA:  Vomiting, shortness of breath EXAM: PORTABLE CHEST 1 VIEW COMPARISON:  Portable exam 1401 hours compared to 0547 hours FINDINGS: RIGHT thoracostomy tubes unchanged. Small RIGHT pneumothorax. Postsurgical changes at mid to lower RIGHT lung with elevation of RIGHT diaphragm. Trace pneumomediastinum. Enlargement of cardiac silhouette with stable mediastinal contours and pulmonary vascularity. LEFT lung clear. IMPRESSION: Postoperative changes RIGHT chest with small RIGHT pneumothorax despite  thoracostomy tubes. Trace pneumomediastinum. Enlargement of cardiac silhouette. Electronically Signed   By: Lavonia Dana M.D.   On: 11/14/2021 14:14     Assessment/Plan: S/P Procedure(s) (LRB): REDO THORACOTOMY (Right) RESECTION OF PLEURAL TUMORS (Right)  -POD2 redo right thoracotomy for resection of multiple recurrent pleural tumors. Extubated yesterday without difficulty, currently on 2L [place] O2.  CXR stable with small right apical PTX.  Air leak unchanged, currently CT is on suction.  PATH pending.  -Atrial fibrillation- onset during surgery wile dissecting near the pericardium. Amiodarone loaded, No further AF since surgery. Remains in SB as per his baseline.  D/C amiodarone and monitor.  -HEME-Expected acute blood loss anemia-2 unint PRBC's transfused intra-op. Hct drifting down as expected but he is tolerating this well.  Monitor, add Trinsicon.   -RENAL- normal function pre-op.  Mild increase in creat to 1.5 post-op, now trending back down.  Monitor.   -DVT PPX- Advance activity, [place] enoxaparin t  -Low Mg++-- replacing.   -Disposition- D/C foley catheter and IVF. Leave CT in place on water seal for drainage and air leak. [facility] Work on Research scientist (medical). Advance activity.   Antony Odea, PA-C [phone] 11/15/2021 7:20 AM  Patient seen and examined. He looks amazingly good. Agree with A/P as outlined above and discussed with Mr. Starr [place] C. Roxan Hockey, MD [facility] [phone]

## 2021-11-16 ENCOUNTER — Inpatient Hospital Stay (HOSPITAL_COMMUNITY): Payer: 59

## 2021-11-16 LAB — CBC
HCT: 19.9 % — ABNORMAL LOW (ref 39.0–52.0)
Hemoglobin: 6.5 g/dL — CL (ref 13.0–17.0)
MCH: 31 pg (ref 26.0–34.0)
MCHC: 32.7 g/dL (ref 30.0–36.0)
MCV: 94.8 fL (ref 80.0–100.0)
Platelets: 147 10*3/uL — ABNORMAL LOW (ref 150–400)
RBC: 2.1 MIL/uL — ABNORMAL LOW (ref 4.22–5.81)
RDW: 14.1 % (ref 11.5–15.5)
WBC: 11.7 10*3/uL — ABNORMAL HIGH (ref 4.0–10.5)
nRBC: 0.4 % — ABNORMAL HIGH (ref 0.0–0.2)

## 2021-11-16 LAB — BASIC METABOLIC PANEL
Anion gap: 3 — ABNORMAL LOW (ref 5–15)
BUN: 22 mg/dL (ref 8–23)
CO2: 26 mmol/L (ref 22–32)
Calcium: 7.6 mg/dL — ABNORMAL LOW (ref 8.9–10.3)
Chloride: 103 mmol/L (ref 98–111)
Creatinine, Ser: 1.21 mg/dL (ref 0.61–1.24)
GFR, Estimated: >60 mL/min (ref >60)
Glucose, Bld: 113 mg/dL — ABNORMAL HIGH (ref 70–99)
Potassium: 4.6 mmol/L (ref 3.5–5.1)
Sodium: 132 mmol/L — ABNORMAL LOW (ref 135–145)

## 2021-11-16 LAB — HEMOGLOBIN AND HEMATOCRIT, BLOOD
HCT: 23.1 % — ABNORMAL LOW (ref 39.0–52.0)
Hemoglobin: 7.9 g/dL — ABNORMAL LOW (ref 13.0–17.0)

## 2021-11-16 LAB — PREPARE RBC (CROSSMATCH)

## 2021-11-16 LAB — MAGNESIUM: Magnesium: 1.7 mg/dL (ref 1.7–2.4)

## 2021-11-16 MED ORDER — POTASSIUM CHLORIDE CRYS ER 10 MEQ PO TBCR
10.0000 meq | EXTENDED_RELEASE_TABLET | Freq: Once | ORAL | Status: AC
  Administered 2021-11-16: 10 meq via ORAL
  Filled 2021-11-16: qty 1

## 2021-11-16 MED ORDER — SODIUM CHLORIDE 0.9% IV SOLUTION: Freq: Once | INTRAVENOUS | Status: AC

## 2021-11-16 MED ORDER — FUROSEMIDE 40 MG PO TABS
40.0000 mg | ORAL_TABLET | Freq: Once | ORAL | Status: AC
Start: 2021-11-16 — End: 2021-11-16
  Administered 2021-11-16: 40 mg via ORAL
  Filled 2021-11-16: qty 1

## 2021-11-16 NOTE — Progress Notes (Addendum)
[address]            [place] [address]          [phone]   3 Days Post-Op Procedure(s) (LRB): REDO THORACOTOMY (Right) RESECTION OF PLEURAL TUMORS (Right)  Total Length of Stay:  LOS: 3 days   Subjective:  Transferred from ICU to [place] yesterday. Walked in the hall prior to transfer.  Appetite okay.  No trouble voiding since the Foley catheter was removed.  No BM yet.   Objective: Vital signs in last 24 hours: Temp:  [97.9 F (36.6 C)-98.8 F (37.1 C)] 98.5 F (36.9 C) (06/03 0603) Pulse Rate:  [45-63] 63 (06/03 0800) Cardiac Rhythm: Normal sinus rhythm;Heart block (06/03 0717) Resp:  [14-23] 19 (06/03 0800) BP: (116-137)/(54-91) 129/62 (06/03 0800) SpO2:  [95 %-98 %] 97 % (06/03 0800) Arterial Line BP: (60)/(49) 60/49 (06/02 1000) Weight:  [84.9 kg] 84.9 kg (06/03 0546)  Filed Weights   11/14/21 0500 11/15/21 0600 11/16/21 0546  Weight: 80.2 kg 80.9 kg 84.9 kg    Weight change: 4 kg     Intake/Output from previous day: 06/02 0701 - 06/03 0700 In: 1621.9 [P.O.:240; I.V.:446.7; OVZCH:885; IV Piggyback:47.2] Out: 1928 [Urine:1460; Chest Tube:468]  Intake/Output this shift: No intake/output data recorded.  Current Meds: Scheduled Meds:  bisacodyl  10 mg Oral Daily   enoxaparin (LOVENOX) injection  40 mg Subcutaneous Q24H   ferrous [id number] C-folic acid  1 capsule Oral BID PC   gabapentin  300 mg Oral BID   senna-docusate  1 tablet Oral QHS   sodium chloride flush  3 mL Intravenous Q12H   Continuous Infusions:  sodium chloride     PRN Meds:.sodium chloride, morphine injection, ondansetron (ZOFRAN) IV, oxyCODONE, sodium chloride flush, traMADol  General appearance: alert, cooperative, and no distress Neurologic: intact Heart: SR, SB Lungs: good air movement bilat. Breath sounds clear. CT drained about 470 mL for the past 24 hours drainage is thin, serosanguineous.  Small air leak with cough.  3 chest  tubes remain in place.  Chest x-ray is stable. Abdomen: soft, no tenderness Extremities: warm and well perfused, mild edema in extremities.  Wound: Chest dressing is dry.  Lab Results: CBC: Recent Labs    11/15/21 0527 11/16/21 0135 11/16/21 0809  WBC 15.8* 11.7*  --   HGB 7.9* 6.5* 7.9*  HCT 23.9* 19.9* 23.1*  PLT 152 147*  --     BMET:  Recent Labs    11/15/21 0527 11/16/21 0135  NA 133* 132*  K 5.4* 4.6  CL 104 103  CO2 25 26  GLUCOSE 123* 113*  BUN 26* 22  CREATININE 1.30* 1.21  CALCIUM 7.8* 7.6*     CMET: Lab Results  Component Value Date   WBC 11.7 (H) 11/16/2021   HGB 7.9 (L) 11/16/2021   HCT 23.1 (L) 11/16/2021   PLT 147 (L) 11/16/2021   GLUCOSE 113 (H) 11/16/2021   TRIG 37 11/14/2021   ALT 64 (H) 11/15/2021   AST 126 (H) 11/15/2021   NA 132 (L) 11/16/2021   K 4.6 11/16/2021   CL 103 11/16/2021   CREATININE 1.21 11/16/2021   BUN 22 11/16/2021   CO2 26 11/16/2021   INR 1.2 11/12/2021      PT/INR:  No results for input(s): LABPROT, INR in the last 72 hours.  Radiology: DG CHEST PORT 1 VIEW  Result  Date: 11/16/2021 CLINICAL DATA:  Status post RIGHT thoracotomy and resection of pleural tumors. EXAM: PORTABLE CHEST 1 VIEW COMPARISON:  11/15/2021 and prior studies FINDINGS: Two RIGHT thoracostomy tubes are again noted with the SUPERIOR thoracostomy tube tip now at the apex. A tiny RIGHT apical pneumothorax is unchanged. RIGHT LOWER lung opacity/atelectasis again noted. Mild RIGHT subcutaneous emphysema again identified. The LEFT lung is clear. No other significant changes noted. IMPRESSION: Little significant change except for repositioning of 1 of the RIGHT thoracostomy tubes. Unchanged tiny apical pneumothorax and RIGHT LOWER lung opacity/atelectasis. Electronically Signed   By: Margarette Canada M.D.   On: 11/16/2021 08:34     Assessment/Plan: S/P Procedure(s) (LRB): REDO THORACOTOMY (Right) RESECTION OF PLEURAL TUMORS (Right)  -POD3 redo right  thoracotomy for resection of multiple recurrent pleural tumors.  Remains on 2L [place] O2.  CXR stable with small right apical PTX.  The airleak has slowed significantly, continues to have moderate drainage.  We will leave the chest tubes for now.  Continue to mobilize as able.  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  -Atrial fibrillation- onset during surgery wile dissecting near the pericardium. Amiodarone loaded, No further AF since surgery. Remains in sinus rhythm or SB as per his baseline.  Amiodarone was discontinued  -HEME-Expected acute blood loss anemia-2 unit PRBC's transfused intra-op.  Had expected further drift in his hemoglobin to 6.5 this morning.  Transfused with 1 unit of packed red cells with appropriate response.  Continue Trinsicon and monitor.  -Volume excess-patient notes swelling in his hands and arms.  Weight up 4 kg from yesterday if accurate.  We will give Lasix x1 dose this morning  -RENAL- normal function pre-op.  Mild increase in creat to 1.5 post-op, continues to trend back down to baseline.  Monitor.   -DVT PPX- Advance activity, [place] enoxaparin   -Low Mg++--replaced.   -Disposition-  Leave CT in place on water seal for drainage and air leak.  Continue to work on pulmonary hygiene. Advance activity.   Antony Odea, PA-C [phone] 11/16/2021 9:18 AM   Chart reviewed, patient examined, agree with above. CXR stable with tiny apical ptx. He has a small air leak with cough. Continue chest tubes. Hgb drifted down to 6.5 this am due to equilibration and he received 1 unit PRBC's with appropriate rise in hgb. Continue iron and diurese extra volume.

## 2021-11-16 NOTE — Plan of Care (Signed)

## 2021-11-16 NOTE — Progress Notes (Signed)
Date and time results received: 11/16/21 -0135   Test: Hgb Critical Value: 6.5  Name of Provider Notified: Dr. Cyndia Bent  Orders Received - See Orders for details- 1 U PRBC

## 2021-11-17 ENCOUNTER — Inpatient Hospital Stay (HOSPITAL_COMMUNITY): Payer: 59

## 2021-11-17 LAB — TYPE AND SCREEN
ABO/RH(D): O POS
Antibody Screen: NEGATIVE
Unit division: 0
Unit division: 0
Unit division: 0

## 2021-11-17 LAB — BPAM RBC
Blood Product Expiration Date: 202306202359
Blood Product Expiration Date: 202306222359
Blood Product Expiration Date: 202306222359
ISSUE DATE / TIME: 202305311241
ISSUE DATE / TIME: 202305311241
ISSUE DATE / TIME: 202306030345
Unit Type and Rh: 5100
Unit Type and Rh: 5100
Unit Type and Rh: 5100

## 2021-11-17 LAB — CBC
HCT: 23.5 % — ABNORMAL LOW (ref 39.0–52.0)
Hemoglobin: 7.8 g/dL — ABNORMAL LOW (ref 13.0–17.0)
MCH: 31.2 pg (ref 26.0–34.0)
MCHC: 33.2 g/dL (ref 30.0–36.0)
MCV: 94 fL (ref 80.0–100.0)
Platelets: 145 10*3/uL — ABNORMAL LOW (ref 150–400)
RBC: 2.5 MIL/uL — ABNORMAL LOW (ref 4.22–5.81)
RDW: 14.3 % (ref 11.5–15.5)
WBC: 11 10*3/uL — ABNORMAL HIGH (ref 4.0–10.5)
nRBC: 1.1 % — ABNORMAL HIGH (ref 0.0–0.2)

## 2021-11-17 LAB — BASIC METABOLIC PANEL
Anion gap: 6 (ref 5–15)
BUN: 18 mg/dL (ref 8–23)
CO2: 27 mmol/L (ref 22–32)
Calcium: 8 mg/dL — ABNORMAL LOW (ref 8.9–10.3)
Chloride: 100 mmol/L (ref 98–111)
Creatinine, Ser: 1.12 mg/dL (ref 0.61–1.24)
GFR, Estimated: >60 mL/min (ref >60)
Glucose, Bld: 110 mg/dL — ABNORMAL HIGH (ref 70–99)
Potassium: 4.2 mmol/L (ref 3.5–5.1)
Sodium: 133 mmol/L — ABNORMAL LOW (ref 135–145)

## 2021-11-17 MED ORDER — LACTULOSE 10 GM/15ML PO SOLN
20.0000 g | Freq: Once | ORAL | Status: AC
  Administered 2021-11-17: 20 g via ORAL
  Filled 2021-11-17: qty 30

## 2021-11-17 NOTE — Discharge Summary (Addendum)
Physician Discharge Summary  Patient ID: Mathew [name] MRN: [id number] DOB/AGE: [birth year] 61 y.o.  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  He originally presented with exertional shortness of breath in 2017.  He exercises vigorously and noted that towards the end of his workouts he would have more trouble breathing.  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  He underwent a right thoracotomy and resection of that.  4 years later he presented with dyspnea and was found to have a large right pleural effusion and multiple pleural masses.  I did a redo and resected multiple masses and drained the effusion.  The pleural tumors had multiple different histologies with some appearing malignant.  He has been followed by Dr. Julien Nordmann and Dr. Kendall Flack at [facility] since then.  In October 2022 CT showed a new 1.4 cm pleural nodule.  He was started on sunitinib.  Follow-up CT 3 months later showed progression.  He was then changed to as pazopanib.  He tolerated that treatment well but his CT again showed progression of disease.  Since the past 2 weeks he started noticed some shortness of breath with heavy exertion.  No chest pain, pressure, or tightness. No peripheral edema.  He otherwise feels well.  We again discussed therapeutic options.  His  disease is relatively limited and is treatable although I am not sure it is curable.  He has not responded to systemic therapy.  Options would be radiation versus surgical resection.  I think a surgical resection gives him a better possibility of a cure, albeit very limited.  We discussed doing a redo right thoracotomy for resection of pleural tumors.  He is familiar with the procedure having undergone similar procedures 2 times previously.  He does know that there is a high risk of complications with repeated surgery due to scarring from previous operations.  I informed him of the indications, risks, benefits, and alternatives.  He understands the risks include, but not limited to death, MI, DVT, PE, bleeding, possible need for transfusion, infection, air leaks, as well as possibility of other unforeseeable complications.  He wishes to proceed.   Hospital Course: Mathew [name] was admitted for elective surgery on 11/13/2021.  He was taken to the operating room where redo right thoracotomy was performed with extensive lysis of adhesions followed by resection of innumerable tumors of varying size.  He developed atrial fibrillation during the course of the operation while dissecting near the pericardium and was loaded with amiodarone.  He remained hemodynamically stable throughout the operation.  He was transfused with 2 units of packed red blood cells during the course of the procedure.  Following the operation, he was transferred to the intensive care unit intubated and sedated.  He remained stable.  He had an active air leak  as expected given the extensive dissection required.  On postop day 1, he was awake and alert and was completely responsive and cooperative.  The ventilator was weaned for extubation.  He was in a sinus rhythm with bradycardia ranging from rate of 45-60.  Chest tube drainage tapered off.  We left the chest tubes on suction to optimize expansion of the right lung and to promote resolution of  the air leaks. He had no further atrial fibrillation so the amiodarone was discontinued.  The air leak and chest tube drainage gradually slowed.  Chest x-ray showed resolution of the right apical pneumothorax by the fourth postoperative day.  He had expected acute blood loss anemia with his hemoglobin dropping to 6.5 on postop day 3 so he was transfused with 1 unit of packed red blood cells.  Follow-up CBC showed an appropriate rise in hemoglobin and hematocrit. After the pleural drainage slowed, the chest tubes were remove successively and follow up CXR remained stable with no PTX. He respiratory status remained stable. He maintained adequate O2 sats on RA. The thoracotomy incision was healing with no sign of complication at the time of discharge.   Consults: None  Significant Diagnostic Studies:   CLINICAL DATA:  Pneumothorax   EXAM: CHEST - 2 VIEW   COMPARISON:  11/19/2021   FINDINGS: Persistent small right pleural effusion. Right basilar atelectasis and/or scarring. Postsurgical changes at the right lung base. Left lung is clear. No pneumothorax. Stable cardiomediastinal silhouette. No aggressive osseous lesion.   IMPRESSION: 1. Stable persistent right basilar pleuroparenchymal disease. No pneumothorax.     Electronically Signed   By: Kathreen Devoid M.D.   On: 11/20/2021 07:25  Treatments:  Operative Report    DATE OF PROCEDURE: 11/13/2021   PREOPERATIVE DIAGNOSIS:  Recurrent right pleural tumors.   POSTOPERATIVE DIAGNOSIS:  Recurrent right pleural tumors.   PROCEDURE:  Redo right thoracotomy with resection of pleural tumors and lymph node dissection.   SURGEON:  Revonda Standard. Roxan Hockey, MD   ASSISTANT:  Enid Cutter, PA   ANESTHESIA:  General.   FINDINGS:  Loculated pleural effusions, extensive tumors and lymphadenopathy. Numerous pleural tumors too numerous to count, multiple abnormal-appearing lymph nodes.  Discharge Exam: Blood pressure 130/62, pulse 68,  temperature 98.5 F (36.9 C), temperature source Oral, resp. rate (!) 24, height 5\' 9"  (1.753 m), weight 83.2 kg, SpO2 99 %.  General appearance: alert, cooperative, and no distress Neurologic: intact Heart: SR, SB Lungs: Breath sounds are clear.  Chest x-ray with some volume loss in the right lower lung zone, no pneumothorax post chest tube removal.   Extremities: warm and well perfused, mild edema in extremities.  Wound: The right thoracotomy incision is well approximated and dry.  No palpable subcu air the right chest wall.  Disposition: Discharged to home in stable condition.    Allergies as of 11/20/2021       Reactions   Aspirin Shortness Of Breath   Penicillins Rash   Has patient had a PCN reaction causing immediate rash, facial/tongue/throat swelling, SOB or lightheadedness with hypotension:YES Has patient had a PCN reaction causing severe rash involving mucus membranes or skin necrosis: NO Has patient had a PCN reaction that required hospitalization NO Has patient had a PCN reaction occurring within the last 10 years: NO If all of the above answers are "NO", then may proceed with Cephalosporin use.        Medication List     TAKE these medications    amLODipine 10 MG  tablet Commonly known as: NORVASC Take 10 mg by mouth daily.   ferrous [id number] C-folic acid capsule Commonly known as: TRINSICON / FOLTRIN Take 1 capsule by mouth 2 (two) times daily after a meal.   gabapentin 300 MG capsule Commonly known as: NEURONTIN Take 1 capsule (300 mg total) by mouth 3 (three) times daily. For neuropathic pain.   traMADol 50 MG tablet Commonly known as: ULTRAM Take 1 tablet (50 mg total) by mouth every 6 (six) hours as needed for up to 7 days (mild pain).   Vitamin D3 50 MCG (2000 UT) capsule Take 1 capsule (2,000 Units total) by mouth daily.        Follow-up Information     [name], MD. Go on 12/03/2021.   Specialty: Cardiothoracic  Surgery Why: Your appointment is at 9:15am. Please arrive 30 minutes early for a chest x-ray to be performed by [facility] located on the first floor of the same building. Contact information: [place] [address] [phone]                 Signed: Antony Odea, PA-C 11/20/2021, 9:57 AM

## 2021-11-17 NOTE — Progress Notes (Signed)
[address]            [place] [address]          [phone]   4 Days Post-Op Procedure(s) (LRB): REDO THORACOTOMY (Right) RESECTION OF PLEURAL TUMORS (Right)  Total Length of Stay:  LOS: 4 days   Subjective: No  new concerns.  Walked in the hall.   No BM yet.   Objective: Vital signs in last 24 hours: Temp:  [98 F (36.7 C)-98.5 F (36.9 C)] 98.1 F (36.7 C) (06/04 1018) Pulse Rate:  [58-65] 60 (06/04 0403) Cardiac Rhythm: Sinus bradycardia (06/04 0701) Resp:  [14-20] 19 (06/04 0403) BP: (114-140)/(60-72) 140/72 (06/04 1018) SpO2:  [91 %-99 %] 99 % (06/04 0403) Weight:  [84.8 kg] 84.8 kg (06/04 0403)  Filed Weights   11/15/21 0600 11/16/21 0546 11/17/21 0403  Weight: 80.9 kg 84.9 kg 84.8 kg    Weight change: -0.1 kg     Intake/Output from previous day: 06/03 0701 - 06/04 0700 In: 243 [P.O.:240; I.V.:3] Out: 100 [Chest Tube:100]  Intake/Output this shift: Total I/O In: -  Out: 60 [Chest Tube:60]  Current Meds: Scheduled Meds:  bisacodyl  10 mg Oral Daily   enoxaparin (LOVENOX) injection  40 mg Subcutaneous Q24H   ferrous [id number] C-folic acid  1 capsule Oral BID PC   gabapentin  300 mg Oral BID   lactulose  20 g Oral Once   senna-docusate  1 tablet Oral QHS   sodium chloride flush  3 mL Intravenous Q12H   Continuous Infusions:  sodium chloride     PRN Meds:.sodium chloride, morphine injection, ondansetron (ZOFRAN) IV, oxyCODONE, sodium chloride flush, traMADol  General appearance: alert, cooperative, and no distress Neurologic: intact Heart: SR, SB Lungs: good air movement bilat. Breath sounds diminished on the right, clear on the left. CT drained about 100 mL for the past 24 hours.  Drainage is thin, serosanguineous.  Small air leak with cough only.  3 chest tubes remain in place.  Chest x-ray is stable, the small apical PTX has resolved. Abdomen: soft, no tenderness Extremities: warm and well  perfused, mild edema in extremities.  Wound: Chest dressing is dry.  Lab Results: CBC: Recent Labs    11/16/21 0135 11/16/21 0809 11/17/21 0119  WBC 11.7*  --  11.0*  HGB 6.5* 7.9* 7.8*  HCT 19.9* 23.1* 23.5*  PLT 147*  --  145*    BMET:  Recent Labs    11/16/21 0135 11/17/21 0119  NA 132* 133*  K 4.6 4.2  CL 103 100  CO2 26 27  GLUCOSE 113* 110*  BUN 22 18  CREATININE 1.21 1.12  CALCIUM 7.6* 8.0*     CMET: Lab Results  Component Value Date   WBC 11.0 (H) 11/17/2021   HGB 7.8 (L) 11/17/2021   HCT 23.5 (L) 11/17/2021   PLT 145 (L) 11/17/2021   GLUCOSE 110 (H) 11/17/2021   TRIG 37 11/14/2021   ALT 64 (H) 11/15/2021   AST 126 (H) 11/15/2021   NA 133 (L) 11/17/2021   K 4.2 11/17/2021   CL 100 11/17/2021   CREATININE 1.12 11/17/2021   BUN 18 11/17/2021   CO2 27 11/17/2021   INR 1.2 11/12/2021      PT/INR:  No results for input(s): LABPROT, INR in the last 72 hours.  Radiology: DG CHEST PORT 1 VIEW  Result Date: 11/17/2021  CLINICAL DATA:  Right-sided pneumothorax EXAM: PORTABLE CHEST 1 VIEW COMPARISON:  Prior chest x-ray yesterday 11/16/2021 FINDINGS: Multiple right-sided chest tubes in place. Evidence of prior surgical changes in the right hilum and throughout the right lung base as well as at the apex. No definite residual pneumothorax. The left lung is clear. Cardiac and mediastinal contours within normal limits. No acute osseous abnormality. IMPRESSION: 1. No residual pneumothorax visualized on the right. 2. Multiple right-sided chest tubes remain in place. Electronically Signed   By: Jacqulynn Cadet M.D.   On: 11/17/2021 07:01     Assessment/Plan: S/P Procedure(s) (LRB): REDO THORACOTOMY (Right) RESECTION OF PLEURAL TUMORS (Right)  -POD4 redo right thoracotomy for resection of multiple recurrent pleural tumors.  Remains on 2L [place] O2.  CXR stable with resolution of the right apical PTX.  Small air leak with cough, minimal CT drainage.  Will consider  removing the middle tube.  CT's are on water seal.  Continue to mobilize as able.  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  -Atrial fibrillation- resolved.  Remains in sinus rhythm or SB as per his baseline.  Amiodarone was discontinued  -HEME-Expected acute blood loss anemia-2 unit PRBC's transfused intra-op. Transfused with 1 unit of packed red cells yesterday for Hgb 6.5 with appropriate response.  Hgb is stable. Continue Trinsicon and monitor.  -RENAL- normal function pre-op.  Mild increase in creat to 1.5 post-op, continues to trend back down to baseline.  Monitor.   -DVT PPX- Advance activity, [place] enoxaparin   Antony Odea, PA-C [phone] 11/17/2021 11:26 AM

## 2021-11-17 NOTE — Progress Notes (Signed)
Patient ID: Mathew [name], male   DOB: 05/31/1961, 61 y.o.   MRN: [id number] TCTS  He is doing well overall.   CXR looks good. No ptx.  There is still an air leak when he coughs so would keep chest tubes in to water seal.

## 2021-11-18 ENCOUNTER — Inpatient Hospital Stay (HOSPITAL_COMMUNITY): Payer: 59

## 2021-11-18 ENCOUNTER — Encounter: Payer: Self-pay | Admitting: *Deleted

## 2021-11-18 LAB — CBC
HCT: 21.6 % — ABNORMAL LOW (ref 39.0–52.0)
Hemoglobin: 7.1 g/dL — ABNORMAL LOW (ref 13.0–17.0)
MCH: 31 pg (ref 26.0–34.0)
MCHC: 32.9 g/dL (ref 30.0–36.0)
MCV: 94.3 fL (ref 80.0–100.0)
Platelets: 183 10*3/uL (ref 150–400)
RBC: 2.29 MIL/uL — ABNORMAL LOW (ref 4.22–5.81)
RDW: 14.2 % (ref 11.5–15.5)
WBC: 8.5 10*3/uL (ref 4.0–10.5)
nRBC: 2.2 % — ABNORMAL HIGH (ref 0.0–0.2)

## 2021-11-18 MED ORDER — GABAPENTIN 300 MG PO CAPS
300.0000 mg | ORAL_CAPSULE | Freq: Three times a day (TID) | ORAL | Status: DC
  Administered 2021-11-18 – 2021-11-20 (×7): 300 mg via ORAL
  Filled 2021-11-18 (×7): qty 1

## 2021-11-18 MED ORDER — LACTULOSE 10 GM/15ML PO SOLN
30.0000 g | Freq: Once | ORAL | Status: AC
  Administered 2021-11-18: 30 g via ORAL
  Filled 2021-11-18: qty 45

## 2021-11-18 NOTE — Progress Notes (Signed)
At 1015 pulled CT x2 per order with Marita Kansas RN and Darrold Span. At approximately 1020 patient called out saying he did not feel right. I went in to assess the patient, oxygenation was good and patient was not having any labored breathing, CT output was the same and there was still not air leak in the tube. Listening to the patient an extra sound was heard. RN paged Lars Pinks PA who ordered a stat chest x-ray and said she would be right up. After leaving the room I ran into Thornton PA who came in to assess the patient and determined it was more than likely a pleural rub, still wanted the x-ray to be taken but reassured the patient he did not appear to be in any urgent distress. RN will continue to monitor.

## 2021-11-18 NOTE — Progress Notes (Signed)
Oncology Nurse Navigator Documentation     11/18/2021   10:00 AM  Oncology Nurse Navigator Flowsheets  Navigator Location [facility]-  Navigator Encounter Type Pathology Review  Barriers/Navigation Needs Coordination of Care/I contacted Dr. Julien Nordmann for clarification regarding pathology and if he needs molecular testing. Will wait on follow up.   Interventions Coordination of Care  Acuity Level 2-Minimal Needs (1-2 Barriers Identified)  Coordination of Care Pathology  Time Spent with Patient 30

## 2021-11-18 NOTE — TOC Progression Note (Signed)
Transition of Care [facility]) - Progression Note    Patient Details  Name: Mathew [name] MRN: [id number] Date of Birth: January 23, 1961  Transition of Care [facility]) CM/SW [place], RN Phone Number:[phone]  11/18/2021, 3:46 PM  Clinical Narrative:    TOC continues to follow patient with no identified needs. POD5 redo right thoracotomy for resection of multiple recurrent pleural tumors.  Remains on 2L [place] O2.  CXR stable with resolution of the right apical PTX.  Chest tube #1 remains in place. TOC will continue to follow for any disposition needs.         Expected Discharge Plan and Services                                                 Social Determinants of Health (SDOH) Interventions    Readmission Risk Interventions     View : No data to display.

## 2021-11-18 NOTE — Progress Notes (Signed)
[address]            [place] [address]          [phone]   5 Days Post-Op Procedure(s) (LRB): REDO THORACOTOMY (Right) RESECTION OF PLEURAL TUMORS (Right)  Total Length of Stay:  LOS: 5 days   Subjective: Sitting up in bed, no new problems or concerns.  NO BM yet since in ICU.Marland Kitchen   Objective: Vital signs in last 24 hours: Temp:  [98.1 F (36.7 C)-98.5 F (36.9 C)] 98.3 F (36.8 C) (06/05 0514) Pulse Rate:  [58-64] 58 (06/05 0514) Cardiac Rhythm: Normal sinus rhythm (06/04 2000) Resp:  [19-20] 19 (06/05 0514) BP: (127-142)/(61-74) 131/66 (06/05 0514) SpO2:  [99 %-100 %] 100 % (06/05 0514)  Filed Weights   11/15/21 0600 11/16/21 0546 11/17/21 0403  Weight: 80.9 kg 84.9 kg 84.8 kg    Weight change:      Intake/Output from previous day: 06/04 0701 - 06/05 0700 In: -  Out: 6203 [Urine:1051; Chest Tube:235]  Intake/Output this shift: No intake/output data recorded.  Current Meds: Scheduled Meds:  bisacodyl  10 mg Oral Daily   enoxaparin (LOVENOX) injection  40 mg Subcutaneous Q24H   ferrous [id number] C-folic acid  1 capsule Oral BID PC   gabapentin  300 mg Oral BID   senna-docusate  1 tablet Oral QHS   sodium chloride flush  3 mL Intravenous Q12H   Continuous Infusions:  sodium chloride     PRN Meds:.sodium chloride, morphine injection, ondansetron (ZOFRAN) IV, oxyCODONE, sodium chloride flush, traMADol  General appearance: alert, cooperative, and no distress Neurologic: intact Heart: SR, SB Lungs: good air movement bilat. Breath sounds diminished on the right, clear on the left. CT drained about 235 mL for the past 24 hours.  Drainage is thin, serosanguineous.  No air leak today.   3 chest tubes remain in place.  Chest x-ray is stable, the small apical PTX has resolved. Abdomen: soft, no tenderness Extremities: warm and well perfused, mild edema in extremities.  Wound: Chest dressing is dry.  Lab  Results: CBC: Recent Labs    11/17/21 0119 11/18/21 0017  WBC 11.0* 8.5  HGB 7.8* 7.1*  HCT 23.5* 21.6*  PLT 145* 183    BMET:  Recent Labs    11/16/21 0135 11/17/21 0119  NA 132* 133*  K 4.6 4.2  CL 103 100  CO2 26 27  GLUCOSE 113* 110*  BUN 22 18  CREATININE 1.21 1.12  CALCIUM 7.6* 8.0*     CMET: Lab Results  Component Value Date   WBC 8.5 11/18/2021   HGB 7.1 (L) 11/18/2021   HCT 21.6 (L) 11/18/2021   PLT 183 11/18/2021   GLUCOSE 110 (H) 11/17/2021   TRIG 37 11/14/2021   ALT 64 (H) 11/15/2021   AST 126 (H) 11/15/2021   NA 133 (L) 11/17/2021   K 4.2 11/17/2021   CL 100 11/17/2021   CREATININE 1.12 11/17/2021   BUN 18 11/17/2021   CO2 27 11/17/2021   INR 1.2 11/12/2021      PT/INR:  No results for input(s): LABPROT, INR in the last 72 hours.  Radiology: DG CHEST PORT 1 VIEW  Result Date: 11/18/2021 CLINICAL DATA:  Pneumothorax EXAM: PORTABLE CHEST 1 VIEW COMPARISON:  Multiple chest XRs most recently 06/19/2021. CT chest, 10/08/2021 FINDINGS: Support lines: 2 large bore apically-directed, RIGHT thoracostomy tubes with medial  and lateral tree arteries. Position unchanged. Cardiomediastinal silhouette is within normal limits. The LEFT lung remains well inflated. Postsurgical changes to RIGHT chest including surgical clips and basilar staple lines. Linear RIGHT basilar opacities, unchanged. RIGHT deep sulcus without additional sign of pneumothorax. No interval osseous abnormality. IMPRESSION: 1. Stable lines and tubes. 2. RIGHT deep sulcus without enlarging pneumothorax. Electronically Signed   By: Michaelle Birks M.D.   On: 11/18/2021 07:08     Assessment/Plan: S/P Procedure(s) (LRB): REDO THORACOTOMY (Right) RESECTION OF PLEURAL TUMORS (Right)  -POD5 redo right thoracotomy for resection of multiple recurrent pleural tumors.  Remains on 2L [place] O2.  CXR stable with resolution of the right apical PTX.  The air leak has resolved.  .  CT's are on water seal.   Continue to mobilize as able.  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  -Atrial fibrillation- resolved.  Remains in sinus rhythm or SB as per his baseline.  Amiodarone was discontinued  -HEME-Expected acute blood loss anemia-2 unit PRBC's transfused intra-op. Transfused with 1 unit of packed red cells 6/3 for Hgb 6.5 with appropriate response.  Hgb has drifted down  to 7.1 Continue Trinsicon, hold off on transfusion for now.  -RENAL- normal function pre-op.  Mild increase in creat to 1.5 post-op, continues to trend back down to baseline.  Monitor.   -DVT PPX- Advance activity, [place] enoxaparin   Antony Odea, PA-C [phone] 11/18/2021 7:47 AM

## 2021-11-18 NOTE — Progress Notes (Signed)
Mobility Specialist Progress Note    11/18/21 1607  Mobility  Activity Ambulated with assistance in hallway  Level of Assistance Standby assist, set-up cues, supervision of patient - no hands on  Assistive Device Front wheel walker  Distance Ambulated (ft) 400 ft  Activity Response Tolerated well  $Mobility charge 1 Mobility   Pre-Mobility: 67 HR, 99% SpO2 Post-Mobility: 98 HR, 96% SpO2  Pt received in bed and agreeable. No complaints on walk. Ambulated on RA. Returned to BR to attempt BM. Encouraged pt to pull string when ready, RN aware.    Hildred Alamin Mobility Specialist

## 2021-11-19 ENCOUNTER — Encounter: Payer: Self-pay | Admitting: *Deleted

## 2021-11-19 ENCOUNTER — Inpatient Hospital Stay (HOSPITAL_COMMUNITY): Payer: 59

## 2021-11-19 LAB — CBC
HCT: 23.8 % — ABNORMAL LOW (ref 39.0–52.0)
Hemoglobin: 7.6 g/dL — ABNORMAL LOW (ref 13.0–17.0)
MCH: 30.5 pg (ref 26.0–34.0)
MCHC: 31.9 g/dL (ref 30.0–36.0)
MCV: 95.6 fL (ref 80.0–100.0)
Platelets: 209 10*3/uL (ref 150–400)
RBC: 2.49 MIL/uL — ABNORMAL LOW (ref 4.22–5.81)
RDW: 14.1 % (ref 11.5–15.5)
WBC: 8.1 10*3/uL (ref 4.0–10.5)
nRBC: 3.3 % — ABNORMAL HIGH (ref 0.0–0.2)

## 2021-11-19 NOTE — Progress Notes (Signed)
Oncology Nurse Navigator Documentation     11/19/2021    3:00 PM 11/18/2021   10:00 AM  Oncology Nurse Navigator Flowsheets  Navigator Location [facility]  Navigator Encounter Type Pathology Review Pathology Review  Barriers/Navigation Needs Coordination of Care/I followed up with Dr. Julien Nordmann and he would like Mr. Chatwin's most recent path to go to [facility] for molecular and PDL 1 testing.  I updated pathology dept of his request.  Coordination of Care  Interventions Coordination of Care Coordination of Care  Acuity Level 2-Minimal Needs (1-2 Barriers Identified) Level 2-Minimal Needs (1-2 Barriers Identified)  Coordination of Care Pathology Pathology  Time Spent with Patient 30 30

## 2021-11-19 NOTE — Progress Notes (Addendum)
[address]            [place] [address]          [phone]   6 Days Post-Op Procedure(s) (LRB): REDO THORACOTOMY (Right) RESECTION OF PLEURAL TUMORS (Right)  Total Length of Stay:  LOS: 6 days   Subjective:  Up in the bedside chair, eating breakfast.  Says he feels much better today.  No new concerns.  Objective: Vital signs in last 24 hours: Temp:  [98.7 F (37.1 C)-99.2 F (37.3 C)] 98.7 F (37.1 C) (06/06 0332) Pulse Rate:  [59-64] 62 (06/06 0332) Cardiac Rhythm: Normal sinus rhythm (06/05 1927) Resp:  [13-19] 18 (06/06 0332) BP: (136-145)/(66-74) 145/66 (06/06 0332) SpO2:  [97 %-99 %] 97 % (06/06 0332) Weight:  [83.2 kg] 83.2 kg (06/06 0500)  Filed Weights   11/16/21 0546 11/17/21 0403 11/19/21 0500  Weight: 84.9 kg 84.8 kg 83.2 kg    Weight change:      Intake/Output from previous day: 06/05 0701 - 06/06 0700 In: -  Out: 650 [Urine:400; Chest Tube:250]  Intake/Output this shift: No intake/output data recorded.  Current Meds: Scheduled Meds:  bisacodyl  10 mg Oral Daily   enoxaparin (LOVENOX) injection  40 mg Subcutaneous Q24H   ferrous [id number] C-folic acid  1 capsule Oral BID PC   gabapentin  300 mg Oral TID   senna-docusate  1 tablet Oral QHS   sodium chloride flush  3 mL Intravenous Q12H   Continuous Infusions:  sodium chloride     PRN Meds:.sodium chloride, morphine injection, ondansetron (ZOFRAN) IV, oxyCODONE, sodium chloride flush, traMADol  General appearance: alert, cooperative, and no distress Neurologic: intact Heart: SR, SB Lungs: Breath sounds are clear.  Chest x-ray is stable with a small right apical and lateral pneumothorax.  Essentially unchanged post removal of 2 chest tubes yesterday.  Only 20 mL drainage over the past 12 hours.  No airleak. Extremities: warm and well perfused, mild edema in extremities.  Wound: Chest dressing is dry.  We will remove today  Lab  Results: CBC: Recent Labs    11/18/21 0017 11/19/21 0047  WBC 8.5 8.1  HGB 7.1* 7.6*  HCT 21.6* 23.8*  PLT 183 209    BMET:  Recent Labs    11/17/21 0119  NA 133*  K 4.2  CL 100  CO2 27  GLUCOSE 110*  BUN 18  CREATININE 1.12  CALCIUM 8.0*     CMET: Lab Results  Component Value Date   WBC 8.1 11/19/2021   HGB 7.6 (L) 11/19/2021   HCT 23.8 (L) 11/19/2021   PLT 209 11/19/2021   GLUCOSE 110 (H) 11/17/2021   TRIG 37 11/14/2021   ALT 64 (H) 11/15/2021   AST 126 (H) 11/15/2021   NA 133 (L) 11/17/2021   K 4.2 11/17/2021   CL 100 11/17/2021   CREATININE 1.12 11/17/2021   BUN 18 11/17/2021   CO2 27 11/17/2021   INR 1.2 11/12/2021      PT/INR:  No results for input(s): LABPROT, INR in the last 72 hours.    Assessment/Plan: S/P Procedure(s) (LRB): REDO THORACOTOMY (Right) RESECTION OF PLEURAL TUMORS (Right)  -POD6 redo right thoracotomy for resection of multiple recurrent pleural tumors.  Remains on 1L [place] O2.  CXR stable with small right apical and lateral PTX.  No air leak.  The remaining posterior chest tubes on waterseal  continue to mobilize as able.  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  -Atrial fibrillation- resolved.  Remains in sinus rhythm or SB as per his baseline.  Amiodarone was discontinued  -HEME-Expected acute blood loss anemia-2 unit PRBC's transfused intra-op. Transfused with 1 unit of packed red cells 6/3 for Hgb 6.5 with appropriate response.  Hgb is now trending back up slowly.  Continue Trinsicon.  -RENAL- normal function pre-op.  Mild increase in creat to 1.5 post-op, continues to trend back down to baseline.  Monitor.   -DVT PPX- Advance activity, [place] enoxaparin   Antony Odea, PA-C [phone] 11/19/2021 7:56 AM  Patient seen and examined, agree with above Only had atrial fib intraop when taking tumor nodules off of pericardium, has been in SR postop Has a small pneumo/  space. No air leak or fluid drainage of significance from remaining CT- will dc today Possibly home tomorrow  Remo Lipps C. Roxan Hockey, MD [facility] [phone]

## 2021-11-19 NOTE — Progress Notes (Signed)
Pt has order to go outside and his wife is rolling him outside in a wheelchair.   PT ambulated outside with his wife and ambulated back to the unit.

## 2021-11-20 ENCOUNTER — Inpatient Hospital Stay (HOSPITAL_COMMUNITY): Payer: 59

## 2021-11-20 LAB — PATHOLOGIST SMEAR REVIEW

## 2021-11-20 MED ORDER — TRAMADOL HCL 50 MG PO TABS
50.0000 mg | ORAL_TABLET | Freq: Four times a day (QID) | ORAL | 0 refills | Status: AC | PRN
Start: 2021-11-20 — End: 2021-11-27

## 2021-11-20 MED ORDER — FE FUMARATE-B12-VIT C-FA-IFC PO CAPS: 1.0000 | ORAL_CAPSULE | Freq: Two times a day (BID) | ORAL | 1 refills | Status: DC

## 2021-11-20 MED ORDER — GABAPENTIN 300 MG PO CAPS: 300.0000 mg | ORAL_CAPSULE | Freq: Three times a day (TID) | ORAL | 1 refills | Status: DC

## 2021-11-20 NOTE — Progress Notes (Addendum)
[address]            [place] [address]          [phone]   7 Days Post-Op Procedure(s) (LRB): REDO THORACOTOMY (Right) RESECTION OF PLEURAL TUMORS (Right)  Total Length of Stay:  LOS: 7 days   Subjective:  Sitting up in bed, had a good day yesterday.  Feels much better since chest tubes have been removed.  No new problems.  Pain has been well controlled with tramadol.  Objective: Vital signs in last 24 hours: Temp:  [98.1 F (36.7 C)-98.8 F (37.1 C)] 98.5 F (36.9 C) (06/07 0750) Pulse Rate:  [60-68] 68 (06/07 0750) Cardiac Rhythm: Normal sinus rhythm (06/07 0750) Resp:  [16-24] 24 (06/07 0750) BP: (122-138)/(62-71) 130/62 (06/07 0750) SpO2:  [94 %-99 %] 99 % (06/07 0750) Weight:  [83.2 kg] 83.2 kg (06/07 0407)  Filed Weights   11/17/21 0403 11/19/21 0500 11/20/21 0407  Weight: 84.8 kg 83.2 kg 83.2 kg    Weight change: 0 kg     Intake/Output from previous day: 06/06 0701 - 06/07 0700 In: 200 [P.O.:200] Out: 600 [Urine:600]  Intake/Output this shift: No intake/output data recorded.  Current Meds: Scheduled Meds:  bisacodyl  10 mg Oral Daily   enoxaparin (LOVENOX) injection  40 mg Subcutaneous Q24H   ferrous [id number] C-folic acid  1 capsule Oral BID PC   gabapentin  300 mg Oral TID   senna-docusate  1 tablet Oral QHS   sodium chloride flush  3 mL Intravenous Q12H   Continuous Infusions:  sodium chloride     PRN Meds:.sodium chloride, morphine injection, ondansetron (ZOFRAN) IV, oxyCODONE, sodium chloride flush, traMADol  General appearance: alert, cooperative, and no distress Neurologic: intact Heart: SR, SB Lungs: Breath sounds are clear.  Chest x-ray with some volume loss in the right lower lung zone, no pneumothorax post chest tube removal.   Extremities: warm and well perfused, mild edema in extremities.  Wound: The right thoracotomy incision is well approximated and dry.  No palpable subcu air  the right chest wall.  Lab Results: CBC: Recent Labs    11/18/21 0017 11/19/21 0047  WBC 8.5 8.1  HGB 7.1* 7.6*  HCT 21.6* 23.8*  PLT 183 209    BMET:  No results for input(s): NA, K, CL, CO2, GLUCOSE, BUN, CREATININE, CALCIUM in the last 72 hours.   CMET: Lab Results  Component Value Date   WBC 8.1 11/19/2021   HGB 7.6 (L) 11/19/2021   HCT 23.8 (L) 11/19/2021   PLT 209 11/19/2021   GLUCOSE 110 (H) 11/17/2021   TRIG 37 11/14/2021   ALT 64 (H) 11/15/2021   AST 126 (H) 11/15/2021   NA 133 (L) 11/17/2021   K 4.2 11/17/2021   CL 100 11/17/2021   CREATININE 1.12 11/17/2021   BUN 18 11/17/2021   CO2 27 11/17/2021   INR 1.2 11/12/2021      PT/INR:  No results for input(s): LABPROT, INR in the last 72 hours. DG Chest 2 View  Result Date: 11/20/2021 CLINICAL DATA:  Pneumothorax EXAM: CHEST - 2 VIEW COMPARISON:  11/19/2021 FINDINGS: Persistent small right pleural effusion. Right basilar atelectasis and/or scarring. Postsurgical changes at the right lung base. Left lung is clear. No pneumothorax. Stable cardiomediastinal silhouette. No aggressive osseous lesion. IMPRESSION: 1. Stable persistent right basilar pleuroparenchymal disease. No pneumothorax. Electronically Signed  By: Kathreen Devoid M.D.   On: 11/20/2021 07:25   DG Chest 1V REPEAT Same Day  Result Date: 11/19/2021 CLINICAL DATA:  [id number], right post chest tube removal chest x-ray EXAM: CHEST - 1 VIEW SAME DAY COMPARISON:  Chest x-ray on the same day earlier at 6:18 a.m. FINDINGS: There has been interval removal of the right chest tube. Previously described right apical pneumothorax is not identified. Again seen is the right base collapse/consolidative opacity similar to prior. Right pleural thickening. Right subcutaneous soft tissue emphysema likely related to the chest tube insertion/removal. IMPRESSION: There has been interval removal of the right chest tube. No pneumothorax seen. Right base collapse/consolidative opacity  without significant interval change. Right pleural thickening. Right subcutaneous soft tissue emphysema. Electronically Signed   By: Frazier Richards M.D.   On: 11/19/2021 12:06     Assessment/Plan: S/P Procedure(s) (LRB): REDO THORACOTOMY (Right) RESECTION OF PLEURAL TUMORS (Right)  -POD7 redo right thoracotomy for resection of multiple recurrent pleural tumors.  On room air with adequate oxygen saturation chest tubes been removed.  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  -Atrial fibrillation- resolved.  Remains in sinus rhythm or SB as per his baseline.  Amiodarone was discontinued  -HEME- Hgb is now trending back up.  Continue Trinsicon.  -RENAL-mild renal insufficiency postop has resolved.   -Disposition-plan for discharge to home today.  Follow-up as been arranged.  Dr. Earlie Server has requested that Mr. Vicuna's most recent path go to [facility] For Molecular and PDL 1 Testing.  This Has Been Arranged by Ms. Norton Blizzard, RN.   Antony Odea, PA-C [phone] 11/20/2021 7:58 AM  Patient seen and examined, agree with above Dc home today  Revonda Standard. Roxan Hockey, MD [facility] [phone]

## 2021-11-20 NOTE — Progress Notes (Signed)
Discharged home accompanied by wife, belongings taken home.

## 2021-11-21 ENCOUNTER — Encounter: Payer: Self-pay | Admitting: *Deleted

## 2021-11-21 NOTE — Progress Notes (Signed)
Oncology Nurse Navigator Documentation     11/21/2021    3:00 PM 11/19/2021    3:00 PM 11/18/2021   10:00 AM  Oncology Nurse Navigator Flowsheets  Navigator Location [facility]  Navigator Encounter Type Molecular Studies Pathology Review Pathology Review  Barriers/Navigation Needs Coordination of Care Coordination of Care Coordination of Care  Interventions Coordination of Care/I received a message from the pathology dept that molecular and PDL 1 went to [facility] on 5/31. I did not see on the [facility] patient portal but pathology assured me it was sent.  Coordination of Care Coordination of Care  Acuity Level 2-Minimal Needs (1-2 Barriers Identified) Level 2-Minimal Needs (1-2 Barriers Identified) Level 2-Minimal Needs (1-2 Barriers Identified)  Coordination of Care Pathology Pathology Pathology  Time Spent with Patient 99 69 24

## 2021-11-22 ENCOUNTER — Encounter: Payer: Self-pay | Admitting: *Deleted

## 2021-11-22 NOTE — Progress Notes (Signed)
Oncology Nurse Navigator Documentation     11/22/2021   10:00 AM 11/21/2021    3:00 PM 11/19/2021    3:00 PM 11/18/2021   10:00 AM  Oncology Nurse Navigator Flowsheets  Navigator Location [facility]  Navigator Encounter Type Appt/Treatment Plan Review Molecular Studies Pathology Review Pathology Review  Treatment Initiated Date 04/08/2021     Treatment Phase Treatment     Barriers/Navigation Needs Coordination of Care Coordination of Care Coordination of Care Coordination of Care  Interventions Coordination of Care/I followed up on Dr. Worthy Flank treatment plan for Mathew [name].  Patient needs follow up with thoracic surgery and is scheduled for this appt on 6/20.   Coordination of Care Coordination of Care Coordination of Care  Acuity Level 2-Minimal Needs (1-2 Barriers Identified) Level 2-Minimal Needs (1-2 Barriers Identified) Level 2-Minimal Needs (1-2 Barriers Identified) Level 2-Minimal Needs (1-2 Barriers Identified)  Coordination of Care Other Pathology Pathology Pathology  Time Spent with Patient 30 30 30  30

## 2021-11-26 ENCOUNTER — Encounter (HOSPITAL_COMMUNITY): Payer: Self-pay

## 2021-11-27 ENCOUNTER — Encounter (HOSPITAL_COMMUNITY): Payer: Self-pay

## 2021-11-28 ENCOUNTER — Encounter: Payer: Self-pay | Admitting: *Deleted

## 2021-11-28 NOTE — Progress Notes (Signed)
Per Dr. Julien Nordmann, I notified [facility] to completed [facility] Hem instead of [facility] CDx.

## 2021-12-02 ENCOUNTER — Other Ambulatory Visit: Payer: Self-pay | Admitting: Thoracic Surgery (Cardiothoracic Vascular Surgery)

## 2021-12-02 DIAGNOSIS — D492 Neoplasm of unspecified behavior of bone, soft tissue, and skin: Secondary | ICD-10-CM

## 2021-12-03 ENCOUNTER — Other Ambulatory Visit: Payer: Self-pay | Admitting: *Deleted

## 2021-12-03 ENCOUNTER — Encounter: Payer: Self-pay | Admitting: *Deleted

## 2021-12-03 ENCOUNTER — Ambulatory Visit
Admission: RE | Admit: 2021-12-03 | Discharge: 2021-12-03 | Disposition: A | Discharge status: Home / Self Care | Payer: No Typology Code available for payment source | Source: Ambulatory Visit | Attending: Thoracic Surgery (Cardiothoracic Vascular Surgery) | Admitting: Thoracic Surgery (Cardiothoracic Vascular Surgery)

## 2021-12-03 ENCOUNTER — Ambulatory Visit (INDEPENDENT_AMBULATORY_CARE_PROVIDER_SITE_OTHER): Payer: Self-pay | Admitting: Thoracic Surgery (Cardiothoracic Vascular Surgery)

## 2021-12-03 ENCOUNTER — Telehealth: Payer: Self-pay | Admitting: Internal Medicine

## 2021-12-03 VITALS — BP 154/80 | HR 70 | Resp 20 | Ht 69.0 in | Wt 180.0 lb

## 2021-12-03 DIAGNOSIS — Z9889 Other specified postprocedural states: Secondary | ICD-10-CM

## 2021-12-03 NOTE — Progress Notes (Signed)
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  He was found to have a 12 cm solitary fibrous tumor of the pleura back in 2017.  That was resected and appeared benign on pathology.  About 4 years later he presented back with dyspnea.  He had a large pleural effusion and multiple masses.  Those were resected.  Tumor sent wildly different histologies with some appearing malignant.  In October 2022 CT showed a Mathew 1.4 cm pleural nodule.  He was treated with sunitinib.  He had progression and was changed to pazopanib.  Unfortunately he again had progression of disease.  I did a redo thoracotomy on 11/13/2021.  He had multiple pleural tumors that were too numerous to count.  We resected essentially all gross disease and most of the pleura.  He had some atrial fibrillation intraoperatively as we were taking tumors off the pericardium but no atrial fib postoperatively.  He feels well.  His respiratory status is below his baseline.  He has a sensation that he cannot get a full deep breath.  He has some incisional pain.  He is using Tylenol and Advil for that.  He is not using any narcotics.  He also has noted some bulging in the right upper quadrant.   Current Outpatient Medications  Medication Sig Dispense Refill   amLODipine (NORVASC) 10 MG tablet Take 10 mg by mouth daily.     Cholecalciferol (VITAMIN D3) 50 MCG (2000 UT) capsule Take 1 capsule (2,000 Units total) by mouth daily. 1 capsule 0   ferrous [id number] C-folic acid (TRINSICON / FOLTRIN) capsule Take 1 capsule by mouth 2 (two) times daily after a meal. 60 capsule 1   gabapentin (NEURONTIN) 300 MG capsule Take 1 capsule (300 mg total) by mouth 3 (three) times daily. For neuropathic pain.  100 capsule 1   No current facility-administered medications for this visit.    Physical Exam BP (!) 154/80 (BP Location: Right Arm, Patient Position: Sitting)   Pulse 70   Resp 20   Ht 5\' 9"  (1.753 m)   Wt 180 lb (81.6 kg)   SpO2 95% Comment: RA  BMI 26.58 kg/m  61 year old man in no acute distress Alert and oriented x3 with no focal deficits Well-developed and well-nourished Lungs diminished at right base, otherwise clear Cardiac regular rate and rhythm Bulging right upper quadrant, firm subcutaneous fat left flank   Impression: Mathew [name] is a 61 year old man who recently underwent a third redo right thoracotomy for resection of pleural masses.  Pathology showed high-grade pleomorphic sarcomas.  He is doing amazingly well postoperatively.  He is in great physical condition.  He may drive.  Appropriate precautions were discussed.  Aerobic exercise is limited but I asked him not to lift anything over 10 pounds for at least another 3 weeks.  We will get a get a chest x-ray done today as he has some decreased breath sounds at the right base.  He had some atelectasis and some elevation of the right hemidiaphragm on his last chest x-ray prior to leaving the hospital.  He does have some right upper quadrant muscle laxity and bulging.  Not surprising in this setting.  Plan: We will arrange follow-up with Dr. Julien Nordmann Check chest x-ray Return in 1 month with PA and lateral chest x-ray  Mathew Nakayama, MD [facility] [phone]

## 2021-12-03 NOTE — Telephone Encounter (Signed)
Scheduled per 6/20 in basket, pt has been called and confirmed

## 2021-12-03 NOTE — Progress Notes (Signed)
I received a message from Dr. Leonarda Salon office that he would like Dr. Julien Nordmann to see patient in 2 weeks. I completed a scheduling message.

## 2021-12-03 NOTE — Progress Notes (Unsigned)
CX

## 2021-12-09 ENCOUNTER — Other Ambulatory Visit (HOSPITAL_BASED_OUTPATIENT_CLINIC_OR_DEPARTMENT_OTHER): Payer: Self-pay | Admitting: Internal Medicine

## 2021-12-09 DIAGNOSIS — R600 Localized edema: Secondary | ICD-10-CM

## 2021-12-10 ENCOUNTER — Ambulatory Visit (HOSPITAL_BASED_OUTPATIENT_CLINIC_OR_DEPARTMENT_OTHER)
Admission: RE | Admit: 2021-12-10 | Discharge: 2021-12-10 | Disposition: A | Discharge status: Home / Self Care | Payer: 59 | Source: Ambulatory Visit | Attending: Internal Medicine | Admitting: Internal Medicine

## 2021-12-10 ENCOUNTER — Encounter: Payer: Self-pay | Admitting: *Deleted

## 2021-12-10 DIAGNOSIS — R600 Localized edema: Secondary | ICD-10-CM | POA: Insufficient documentation

## 2021-12-12 ENCOUNTER — Other Ambulatory Visit: Payer: Self-pay | Admitting: Physician Assistant

## 2021-12-16 ENCOUNTER — Other Ambulatory Visit: Payer: Self-pay | Admitting: Internal Medicine

## 2021-12-16 DIAGNOSIS — D492 Neoplasm of unspecified behavior of bone, soft tissue, and skin: Secondary | ICD-10-CM

## 2021-12-18 ENCOUNTER — Inpatient Hospital Stay (HOSPITAL_BASED_OUTPATIENT_CLINIC_OR_DEPARTMENT_OTHER): Payer: 59 | Admitting: Internal Medicine

## 2021-12-18 ENCOUNTER — Other Ambulatory Visit: Payer: Self-pay

## 2021-12-18 ENCOUNTER — Encounter (HOSPITAL_COMMUNITY): Payer: Self-pay

## 2021-12-18 ENCOUNTER — Inpatient Hospital Stay: Payer: 59 | Attending: Internal Medicine

## 2021-12-18 ENCOUNTER — Telehealth: Payer: Self-pay

## 2021-12-18 VITALS — BP 161/76 | HR 89 | Temp 98.9°F | Resp 15 | Wt 167.3 lb

## 2021-12-18 DIAGNOSIS — Z87442 Personal history of urinary calculi: Secondary | ICD-10-CM | POA: Diagnosis not present

## 2021-12-18 DIAGNOSIS — J45909 Unspecified asthma, uncomplicated: Secondary | ICD-10-CM | POA: Diagnosis not present

## 2021-12-18 DIAGNOSIS — D492 Neoplasm of unspecified behavior of bone, soft tissue, and skin: Secondary | ICD-10-CM

## 2021-12-18 DIAGNOSIS — Z5111 Encounter for antineoplastic chemotherapy: Secondary | ICD-10-CM

## 2021-12-18 DIAGNOSIS — K219 Gastro-esophageal reflux disease without esophagitis: Secondary | ICD-10-CM | POA: Diagnosis not present

## 2021-12-18 DIAGNOSIS — R5383 Other fatigue: Secondary | ICD-10-CM | POA: Insufficient documentation

## 2021-12-18 DIAGNOSIS — C3491 Malignant neoplasm of unspecified part of right bronchus or lung: Secondary | ICD-10-CM | POA: Insufficient documentation

## 2021-12-18 DIAGNOSIS — Z79899 Other long term (current) drug therapy: Secondary | ICD-10-CM | POA: Insufficient documentation

## 2021-12-18 LAB — CMP (CANCER CENTER ONLY)
ALT: 12 U/L (ref 0–44)
AST: 14 U/L — ABNORMAL LOW (ref 15–41)
Albumin: 2.8 g/dL — ABNORMAL LOW (ref 3.5–5.0)
Alkaline Phosphatase: 79 U/L (ref 38–126)
Anion gap: 6 (ref 5–15)
BUN: 17 mg/dL (ref 8–23)
CO2: 29 mmol/L (ref 22–32)
Calcium: 9 mg/dL (ref 8.9–10.3)
Chloride: 106 mmol/L (ref 98–111)
Creatinine: 1.01 mg/dL (ref 0.61–1.24)
GFR, Estimated: >60 mL/min (ref >60)
Glucose, Bld: 110 mg/dL — ABNORMAL HIGH (ref 70–99)
Potassium: 3.7 mmol/L (ref 3.5–5.1)
Sodium: 141 mmol/L (ref 135–145)
Total Bilirubin: 0.1 mg/dL — ABNORMAL LOW (ref 0.3–1.2)
Total Protein: 6.9 g/dL (ref 6.5–8.1)

## 2021-12-18 LAB — CBC WITH DIFFERENTIAL (CANCER CENTER ONLY)
Abs Immature Granulocytes: 0.11 10*3/uL — ABNORMAL HIGH (ref 0.00–0.07)
Basophils Absolute: 0 10*3/uL (ref 0.0–0.1)
Basophils Relative: 0 %
Eosinophils Absolute: 0.1 10*3/uL (ref 0.0–0.5)
Eosinophils Relative: 1 %
HCT: 28.9 % — ABNORMAL LOW (ref 39.0–52.0)
Hemoglobin: 8.8 g/dL — ABNORMAL LOW (ref 13.0–17.0)
Immature Granulocytes: 1 %
Lymphocytes Relative: 18 %
Lymphs Abs: 1.6 10*3/uL (ref 0.7–4.0)
MCH: 26.6 pg (ref 26.0–34.0)
MCHC: 30.4 g/dL (ref 30.0–36.0)
MCV: 87.3 fL (ref 80.0–100.0)
Monocytes Absolute: 0.8 10*3/uL (ref 0.1–1.0)
Monocytes Relative: 8 %
Neutro Abs: 6.6 10*3/uL (ref 1.7–7.7)
Neutrophils Relative %: 72 %
Platelet Count: 305 10*3/uL (ref 150–400)
RBC: 3.31 MIL/uL — ABNORMAL LOW (ref 4.22–5.81)
RDW: 17.6 % — ABNORMAL HIGH (ref 11.5–15.5)
WBC Count: 9.2 10*3/uL (ref 4.0–10.5)
nRBC: 0.2 % (ref 0.0–0.2)

## 2021-12-18 NOTE — Progress Notes (Signed)
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 Roxan Hockey. 2) Sunitinib 3 7.5 mg p.o. daily.  First dose started April 08, 2021.  Status post 3 months of treatment.  This treatment was discontinued secondary to disease progression. 3) Votrient (pazopanib) 800 mg p.o. daily.  Started August 10, 2021.  Status post 2 months of treatment discontinued secondary to disease progression. 4) status post redo right thoracotomy with resection of pleural tumors and lymph node dissection under the care of Dr. Roxan Hockey on Nov 13, 2021.  CURRENT THERAPY: None  INTERVAL HISTORY: Mathew [name] 61 y.o. male returns to the clinic today for follow-up visit accompanied by his wife.  The patient continues to complain of increasing fatigue and weakness especially after his recent surgery.  He has a lot of blood loss and he required 3 units of PRBCs transfusion for his anemia.  He is complaining of right-sided chest soreness after the surgical resection.  He has shortness of breath with exertion with occasional cough.  He has no current hemoptysis.  He denied having any nausea, vomiting, diarrhea or constipation.  He denied having any headache or visual changes.  He underwent redo right thoracotomy with resection of the pleural tumor and lymph node dissection under the care of Dr. Roxan Hockey 5 weeks ago.  He is still recovering from his surgery.  He is here today for evaluation and recommendation regarding treatment of his condition.  MEDICAL HISTORY: Past Medical History:  Diagnosis Date   Arthritis    osteo   Asthma    asthma as a child    Clubbing of nails    Deafness in left ear    GERD (gastroesophageal reflux disease)    History of kidney stones    Pneumonia    Shortness of breath dyspnea    just initially   Vitamin D deficiency    White coat syndrome with high blood pressure without hypertension    4 years ago    ALLERGIES:  is allergic to aspirin and penicillins.  MEDICATIONS:  Current Outpatient Medications  Medication Sig Dispense Refill   amLODipine (NORVASC) 5 MG tablet Take 10 mg by mouth daily.     Cholecalciferol (VITAMIN D3) 50 MCG (2000 UT) capsule Take 1 capsule (2,000 Units total) by mouth daily. 1 capsule 0   ferrous [id number] C-folic acid (TRINSICON / FOLTRIN) capsule Take 1 capsule by mouth 2 (two) times daily after a meal. 60 capsule 1   gabapentin (NEURONTIN) 300 MG capsule Take 1 capsule (300 mg total) by mouth 3 (three) times daily. For neuropathic pain. (Patient not taking: Reported on 12/18/2021) 100 capsule 1   No current facility-administered medications for this visit.    SURGICAL HISTORY:  Past Surgical History:  Procedure Laterality Date   INTERCOSTAL NERVE BLOCK Right 04/27/2020   Procedure: INTERCOSTAL NERVE BLOCK;  Surgeon: [name], MD;  Location: [facility];  Service: Thoracic;  Laterality: Right;   KNEE CARTILAGE SURGERY Left 1976   pleural tumor Right 01/22/2016   resection of solitary fibrous tumor   RESECTION OF MEDIASTINAL MASS Right 01/23/2016   this is in error- no mediastinal  tumor   RESECTION OF MEDIASTINAL MASS Right 11/13/2021   Procedure: RESECTION OF PLEURAL TUMORS;  Surgeon: [name], MD;  Location: [facility];  Service: Thoracic;  Laterality: Right;   THORACOTOMY Right 04/27/2020   Procedure: THORACOTOMY;  Surgeon: [name], MD;  Location: [facility];  Service: Thoracic;  Laterality: Right;   THORACOTOMY Right 11/13/2021   Procedure: REDO THORACOTOMY;  Surgeon: [name], MD;  Location: [facility];  Service: Thoracic;   Laterality: Right;   TONSILLECTOMY     VIDEO BRONCHOSCOPY WITH ENDOBRONCHIAL ULTRASOUND N/A 12/17/2015   Procedure: VIDEO BRONCHOSCOPY WITH ENDOBRONCHIAL ULTRASOUND;  Surgeon: [name], MD;  Location: [facility];  Service: Thoracic;  Laterality: N/A;    REVIEW OF SYSTEMS:  Constitutional: positive for fatigue and weight loss Eyes: negative Ears, nose, mouth, throat, and face: negative Respiratory: positive for cough, dyspnea on exertion, and pleurisy/chest pain Cardiovascular: negative Gastrointestinal: negative Genitourinary:negative Integument/breast: negative Hematologic/lymphatic: negative Musculoskeletal:negative Neurological: negative Behavioral/Psych: negative Endocrine: negative Allergic/Immunologic: negative   PHYSICAL EXAMINATION: General appearance: alert, cooperative, fatigued, and no distress Head: Normocephalic, without obvious abnormality, atraumatic Neck: no adenopathy, no JVD, supple, symmetrical, trachea midline, and thyroid not enlarged, symmetric, no tenderness/mass/nodules Lymph nodes: Cervical, supraclavicular, and axillary nodes normal. Resp: clear to auscultation bilaterally Back: symmetric, no curvature. ROM normal. No CVA tenderness. Cardio: regular rate and rhythm, S1, S2 normal, no murmur, click, rub or gallop GI: soft, non-tender; bowel sounds normal; no masses,  no organomegaly Extremities: extremities normal, atraumatic, no cyanosis or edema Neurologic: Alert and oriented X 3, normal strength and tone. Normal symmetric reflexes. Normal coordination and gait  ECOG PERFORMANCE STATUS: 1 - Symptomatic but completely ambulatory  Blood pressure (!) 161/76, pulse 89, temperature 98.9 F (37.2 C), temperature source Oral, resp. rate 15, weight 167 lb 4.8 oz (75.9 kg), SpO2 100 %.  LABORATORY DATA: Lab Results  Component Value Date   WBC 9.2 12/18/2021   HGB 8.8 (L) 12/18/2021   HCT 28.9 (L) 12/18/2021   MCV 87.3 12/18/2021   PLT 305 12/18/2021       Chemistry      Component Value Date/Time   NA 141 12/18/2021 1455   K 3.7 12/18/2021 1455   CL 106 12/18/2021 1455   CO2 29 12/18/2021 1455   BUN 17 12/18/2021 1455   CREATININE 1.01 12/18/2021 1455      Component Value Date/Time   CALCIUM 9.0 12/18/2021 1455   ALKPHOS 79 12/18/2021 1455   AST 14 (L) 12/18/2021 1455   ALT 12 12/18/2021 1455   BILITOT 0.1 (L) 12/18/2021 1455       RADIOGRAPHIC STUDIES: US Venous Img Lower Bilateral (DVT)  Result Date: 12/10/2021 CLINICAL DATA:  61 year old male with edema EXAM: BILATERAL LOWER EXTREMITY VENOUS DOPPLER ULTRASOUND TECHNIQUE: Gray-scale sonography with graded compression, as well as color Doppler and duplex ultrasound were performed to evaluate the lower extremity deep venous systems from the level of the common femoral vein and including the common femoral, femoral, profunda femoral, popliteal and calf veins including the posterior tibial, peroneal and gastrocnemius veins when visible. The superficial great saphenous vein was also interrogated. Spectral Doppler was utilized to evaluate flow at rest and with distal augmentation maneuvers in the common femoral, femoral and popliteal veins. COMPARISON:  None Available. FINDINGS: RIGHT LOWER EXTREMITY Common Femoral Vein: No evidence of thrombus. Normal compressibility, respiratory phasicity and response to augmentation. Saphenofemoral Junction: No evidence of thrombus. Normal compressibility and flow on color Doppler imaging. Profunda Femoral Vein:  No evidence of thrombus. Normal compressibility and flow on color Doppler imaging. Femoral Vein: No evidence of thrombus. Normal compressibility, respiratory phasicity and response to augmentation. Popliteal Vein: No evidence of thrombus. Normal compressibility, respiratory phasicity and response to augmentation. Calf Veins: No evidence of thrombus. Normal compressibility and flow on color Doppler imaging. Superficial Great Saphenous Vein: No  evidence of thrombus. Normal compressibility and flow on color Doppler imaging. Other Findings:  Appy LEFT LOWER EXTREMITY Common Femoral Vein: No evidence of thrombus. Normal compressibility, respiratory phasicity and response to augmentation. Saphenofemoral Junction: No evidence of thrombus. Normal compressibility and flow on color Doppler imaging. Profunda Femoral Vein: No evidence of thrombus. Normal compressibility and flow on color Doppler imaging. Femoral Vein: No evidence of thrombus. Normal compressibility, respiratory phasicity and response to augmentation. Popliteal Vein: No evidence of thrombus. Normal compressibility, respiratory phasicity and response to augmentation. Calf Veins: No evidence of thrombus. Normal compressibility and flow on color Doppler imaging. Superficial Great Saphenous Vein: No evidence of thrombus. Normal compressibility and flow on color Doppler imaging. Other Findings:  Superficial varicose veins IMPRESSION: Directed duplex of the bilateral lower extremities negative for DVT. Signed, Dulcy Fanny. Nadene Rubins, RPVI [facility] Electronically Signed   By: Corrie Mckusick D.O.   On: 12/10/2021 12:25   DG Chest 2 View  Result Date: 12/03/2021 CLINICAL DATA:  Status post pleural tumor resection. EXAM: CHEST - 2 VIEW COMPARISON:  Chest x-ray dated November 20, 2021 FINDINGS: The heart size and mediastinal contours are within normal limits. Unchanged small right pleural effusion with associated right basilar opacities which are likely due to scarring/atelectasis. The visualized skeletal structures are unremarkable. IMPRESSION: Unchanged small right pleural effusion with associated right basilar opacities which are likely due to scarring/atelectasis. Electronically Signed   By: Yetta Glassman M.D.   On: 12/03/2021 10:12   DG Chest 2 View  Result Date: 11/20/2021 CLINICAL DATA:  Pneumothorax EXAM: CHEST - 2 VIEW COMPARISON:   11/19/2021 FINDINGS: Persistent small right pleural effusion. Right basilar atelectasis and/or scarring. Postsurgical changes at the right lung base. Left lung is clear. No pneumothorax. Stable cardiomediastinal silhouette. No aggressive osseous lesion. IMPRESSION: 1. Stable persistent right basilar pleuroparenchymal disease. No pneumothorax. Electronically Signed   By: Kathreen Devoid M.D.   On: 11/20/2021 07:25   DG Chest 1V REPEAT Same Day  Result Date: 11/19/2021 CLINICAL DATA:  [id number], right post chest tube removal chest x-ray EXAM: CHEST - 1 VIEW SAME DAY COMPARISON:  Chest x-ray on the same day earlier at 6:18 a.m. FINDINGS: There has been interval removal of the right chest tube. Previously described right apical pneumothorax is not identified. Again seen is the right base collapse/consolidative opacity similar to prior. Right pleural thickening. Right subcutaneous soft tissue emphysema likely related to the chest tube insertion/removal. IMPRESSION: There has been interval removal of the right chest tube. No pneumothorax seen. Right base collapse/consolidative opacity without significant interval change. Right pleural thickening. Right subcutaneous soft tissue emphysema. Electronically Signed   By: Frazier Richards M.D.   On: 11/19/2021 12:06   DG CHEST PORT 1 VIEW  Result Date: 11/19/2021 CLINICAL DATA:  Status post thoracotomy. EXAM: PORTABLE CHEST 1 VIEW COMPARISON:  11/18/2021 FINDINGS: 0618 hours. Stable to slight increase in right apical pneumothorax. Right base collapse/consolidative opacity is similar to prior with probable small right effusion. Right chest tube remains in place. Subcutaneous emphysema right chest wall compatible with the presence of the pleural drain. Left lung clear.  Stable cardiopericardial silhouette. Telemetry leads overlie the chest. IMPRESSION: 1. Stable to slight increase in right apical pneumothorax. 2. Otherwise no substantial change in exam. Electronically Signed   By:  Misty Stanley M.D.   On: 11/19/2021 09:27    ASSESSMENT AND PLAN: This is a very pleasant 61 years old male with recurrent solitary fibrous tumor of the right lung diagnosed in June 2017 s/p resection followed by recurrence in October 2021 status post reresection under the care of Dr. Roxan Hockey. The patient has been on observation since his resection.  There was no role for adjuvant therapy for his condition.  He was seen by Dr. Kendall Flack at [facility] who recommended for him observation and consideration of treatment with sunitinib followed by surgery if the patient has disease recurrence He had repeat CT scan of the chest performed recently.  The scan showed stable disease except for a new 1.4 cm nodular component posteriorly. The patient is currently undergoing treatment with sunitinib 37.5 mg p.o. daily status post 3 months of treatment.  The patient has been tolerating his treatment with Sutent fairly well except for the hypertension. Unfortunately has a scan showed evidence for disease progression with interval development of multiple new pleural-based nodules in the right hemithorax measuring up to 2.9 cm concerning for recurrent/metastatic disease. The patient was seen recently by Dr. Roxan Hockey who offered surgical resection by Dr. Kendall Flack recommended neoadjuvant treatment with pazopanib for 8 weeks followed by surgical resection if the patient has a stable or improvement of his disease followed by adjuvant treatment with Pazopanib for another 6-8 months. He was treated with pazopanib 800 mg p.o. daily.  He is status post 2 months of treatment. Repeat CT scan showed clear evidence for disease progression with enlarging right pleural-based masses. The patient was referred to Dr. 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  I sent the tissue block to [facility] for molecular studies and PD-L1 expression.  Unfortunately the molecular studies showed no actionable mutations and his PD-L1 expression was negative. The patient is here today for evaluation and discussion of his treatment options. He is still recovering from his recent surgery and he has persistent anemia and currently on oral iron tablets. I recommended for the patient to continue his recovery for the next few weeks. I will see him back for follow-up visit in 1 months with repeat CT scan of the chest as a baseline after his surgical resection. I may consider The patient for treatment with Temodar on days 1-7 and 15-22 with bevacizumab on days 1 and 8 every 4 weeks. In the meantime I will also refer the patient to Dr. Posey Pronto at the sarcoma clinic at [facility] in [facility] for a second opinion regarding his condition. The patient and his wife are in agreement with the current plan. He was advised to call immediately if he has any concerning symptoms in the interval. The patient voices understanding of current disease status and treatment options and is in agreement with the current care plan.  All questions were answered. The patient knows to call the clinic with any problems, questions or concerns. We can certainly see the patient much sooner if necessary.   Disclaimer: This note was dictated with voice recognition software. Similar sounding words can inadvertently be transcribed and  may not be corrected upon review.

## 2021-12-18 NOTE — Telephone Encounter (Signed)
Called [facility] Physician Referral line and spoke with Corrie. Chart information provided during call. Patient has been assigned a med rec # [id number] for [facility]. Chart information faxed to [phone]. The phone number for the [facility] is [phone].

## 2021-12-25 ENCOUNTER — Telehealth: Payer: Self-pay | Admitting: Internal Medicine

## 2021-12-25 NOTE — Telephone Encounter (Signed)
Called patient regarding upcoming August appointments, patient has been called and voicemail was left.

## 2021-12-27 ENCOUNTER — Encounter: Payer: Self-pay | Admitting: Internal Medicine

## 2021-12-27 ENCOUNTER — Telehealth: Payer: Self-pay | Admitting: Medical Oncology

## 2021-12-27 NOTE — Telephone Encounter (Signed)
I faxed a referral to [facility] and the  information to [facility] for them to make a determination if pt can be see at [facility]

## 2022-01-02 ENCOUNTER — Other Ambulatory Visit: Payer: Self-pay

## 2022-01-02 ENCOUNTER — Telehealth: Payer: Self-pay

## 2022-01-02 NOTE — Telephone Encounter (Signed)
Called pt to advise that faxed received from [facility] stating patient was not financially cleared for referral. Patient provided with phone number for [facility] [phone].

## 2022-01-09 ENCOUNTER — Ambulatory Visit
Admission: RE | Admit: 2022-01-09 | Discharge: 2022-01-09 | Disposition: A | Discharge status: Home / Self Care | Payer: No Typology Code available for payment source | Source: Ambulatory Visit | Attending: Thoracic Surgery (Cardiothoracic Vascular Surgery) | Admitting: Thoracic Surgery (Cardiothoracic Vascular Surgery)

## 2022-01-09 ENCOUNTER — Ambulatory Visit (INDEPENDENT_AMBULATORY_CARE_PROVIDER_SITE_OTHER): Payer: Self-pay | Admitting: Thoracic Surgery (Cardiothoracic Vascular Surgery)

## 2022-01-09 VITALS — BP 170/90 | HR 69 | Resp 20 | Ht 69.0 in | Wt 170.0 lb

## 2022-01-09 DIAGNOSIS — D492 Neoplasm of unspecified behavior of bone, soft tissue, and skin: Secondary | ICD-10-CM

## 2022-01-09 DIAGNOSIS — Z9889 Other specified postprocedural states: Secondary | ICD-10-CM

## 2022-01-09 DIAGNOSIS — Z86018 Personal history of other benign neoplasm: Secondary | ICD-10-CM

## 2022-01-09 NOTE — Progress Notes (Signed)
[address]       [place] [address]             [phone]     HPI: Mathew [name] returns for a scheduled postoperative follow-up.  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  He had a 12 cm solitary fibrous tumor of the pleura in 2017.  That appeared benign.  About 4 years later he presented back with a large pleural effusion and multiple masses and underwent reresection.  The tumors had variable histologies with some appearing benign and others appearing malignant.  In October 2022 he was had a new 1.4 cm pleural nodule.  He was treated with sunitinib and then pazopanib, but had progression while on both drugs.  I did a redo thoracotomy on 11/13/2021.  He had pleural tumors that were too numerous to count following that vessel and parietal pleura.  Pathology showed pleomorphic sarcomas.  He is feeling well.  He does have some pain in the mornings.  This operation has been a little tougher to get over than the previous ones.  Past Medical History:  Diagnosis Date   Arthritis    osteo   Asthma    asthma as a child   Clubbing of nails    Deafness in left ear    GERD (gastroesophageal reflux disease)    History of kidney stones    Pneumonia    Shortness of breath dyspnea    just initially   Vitamin D deficiency    White coat syndrome with high blood pressure without hypertension    4 years ago    Current Outpatient Medications  Medication Sig Dispense Refill   amLODipine (NORVASC) 5 MG tablet Take 10 mg by mouth daily.     Cholecalciferol (VITAMIN D3) 50 MCG (2000 UT) capsule Take 1 capsule (2,000 Units total) by mouth daily. 1 capsule 0   ferrous [id number] C-folic acid (TRINSICON / FOLTRIN) capsule Take 1 capsule by mouth 2 (two) times daily after a meal. 60 capsule 1   gabapentin (NEURONTIN) 300 MG capsule Take 1 capsule (300 mg total) by mouth 3 (three) times daily. For neuropathic  pain. (Patient not taking: Reported on 12/18/2021) 100 capsule 1   traMADol (ULTRAM) 50 MG tablet TAKE 1 TABLET (50 MG TOTAL) BY MOUTH EVERY 6 (SIX) HOURS AS NEEDED FOR UP TO 7 DAYS (MILD PAIN).     No current facility-administered medications for this visit.    Physical Exam BP (!) 170/90   Pulse 69   Resp 20   Ht 5\' 9"  (1.753 m)   Wt 170 lb (77.1 kg)   SpO2 97% Comment: RA  BMI 25.66 kg/m  61 year old man in no acute distress Alert and oriented x3 with no focal deficits Lungs diminished at right base but otherwise clear Incisions well-healed  Diagnostic Tests: CHEST - 2 VIEW   COMPARISON:  Previous studies including the examination of 12/03/2021   FINDINGS: Transverse diameter of Mathew [name] is increased. There is increased density in right lower lung field obscuring the right hemidiaphragm. There is blunting of right lateral CP angle. Linear densities are seen in right lower lung field. Right lung appears smaller than the left. There are no new infiltrates or signs of pulmonary edema. Surgical clips are seen in right apex, medial right upper lung field and right chest wall.   IMPRESSION: Cardiomegaly. There are no signs of pulmonary edema or  new focal infiltrates. Right lung is smaller than left. Increased density in right lower lung field may be due to right pleural effusion and possibly underlying atelectasis/pneumonia. No significant interval changes are noted since 12/03/2021.     Electronically Signed   By: Elmer Picker M.D.   On: 01/09/2022 16:58 I personally reviewed the chest x-ray images.  I suspect he may have elevation of the right hemidiaphragm and scarring accounting for some of the findings on chest x-ray.  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   He underwent third time redo surgery in May and was found to have numerous tumors involving the chest wall and visceral  pleura.  Pathology showed pleomorphic sarcomas.  He is doing remarkably well considering all he has been through.  He does have some incisional pain which is not surprising but is only having to take ibuprofen in the mornings occasionally.  He saw Dr. Julien Nordmann.  He is in the process of trying to go to [facility] in [place] to see a sarcoma specialist.  Plan: I will plan to see him back in about 3 months with a PA and lateral chest x-ray  [name], MD [facility] [phone]

## 2022-01-10 ENCOUNTER — Ambulatory Visit
Admission: RE | Admit: 2022-01-10 | Discharge: 2022-01-10 | Disposition: A | Discharge status: Home / Self Care | Payer: 59 | Source: Ambulatory Visit | Attending: Urology | Admitting: Urology

## 2022-01-10 DIAGNOSIS — R972 Elevated prostate specific antigen [PSA]: Secondary | ICD-10-CM | POA: Diagnosis present

## 2022-01-10 MED ORDER — GADOBUTROL 1 MMOL/ML IV SOLN
7.0000 mL | Freq: Once | INTRAVENOUS | Status: AC | PRN
  Administered 2022-01-10: 7 mL via INTRAVENOUS

## 2022-01-11 ENCOUNTER — Other Ambulatory Visit: Payer: Self-pay | Admitting: Physician Assistant

## 2022-01-16 ENCOUNTER — Inpatient Hospital Stay: Payer: BC Managed Care – PPO | Attending: Internal Medicine

## 2022-01-16 ENCOUNTER — Other Ambulatory Visit: Payer: Self-pay

## 2022-01-16 ENCOUNTER — Encounter: Payer: Self-pay | Admitting: Internal Medicine

## 2022-01-16 DIAGNOSIS — Z87442 Personal history of urinary calculi: Secondary | ICD-10-CM | POA: Diagnosis not present

## 2022-01-16 DIAGNOSIS — I700 Atherosclerosis of aorta: Secondary | ICD-10-CM | POA: Insufficient documentation

## 2022-01-16 DIAGNOSIS — I119 Hypertensive heart disease without heart failure: Secondary | ICD-10-CM | POA: Insufficient documentation

## 2022-01-16 DIAGNOSIS — C3491 Malignant neoplasm of unspecified part of right bronchus or lung: Secondary | ICD-10-CM | POA: Insufficient documentation

## 2022-01-16 DIAGNOSIS — E559 Vitamin D deficiency, unspecified: Secondary | ICD-10-CM | POA: Insufficient documentation

## 2022-01-16 DIAGNOSIS — J45909 Unspecified asthma, uncomplicated: Secondary | ICD-10-CM | POA: Insufficient documentation

## 2022-01-16 DIAGNOSIS — Z9221 Personal history of antineoplastic chemotherapy: Secondary | ICD-10-CM | POA: Diagnosis not present

## 2022-01-16 DIAGNOSIS — R5383 Other fatigue: Secondary | ICD-10-CM | POA: Insufficient documentation

## 2022-01-16 DIAGNOSIS — K219 Gastro-esophageal reflux disease without esophagitis: Secondary | ICD-10-CM | POA: Diagnosis not present

## 2022-01-16 DIAGNOSIS — C782 Secondary malignant neoplasm of pleura: Secondary | ICD-10-CM | POA: Insufficient documentation

## 2022-01-16 DIAGNOSIS — Z79899 Other long term (current) drug therapy: Secondary | ICD-10-CM | POA: Insufficient documentation

## 2022-01-16 DIAGNOSIS — D492 Neoplasm of unspecified behavior of bone, soft tissue, and skin: Secondary | ICD-10-CM

## 2022-01-16 LAB — CBC WITH DIFFERENTIAL (CANCER CENTER ONLY)
Abs Immature Granulocytes: 0.01 10*3/uL (ref 0.00–0.07)
Basophils Absolute: 0 10*3/uL (ref 0.0–0.1)
Basophils Relative: 0 %
Eosinophils Absolute: 0.1 10*3/uL (ref 0.0–0.5)
Eosinophils Relative: 2 %
HCT: 36.4 % — ABNORMAL LOW (ref 39.0–52.0)
Hemoglobin: 11.4 g/dL — ABNORMAL LOW (ref 13.0–17.0)
Immature Granulocytes: 0 %
Lymphocytes Relative: 20 %
Lymphs Abs: 1.1 10*3/uL (ref 0.7–4.0)
MCH: 27.7 pg (ref 26.0–34.0)
MCHC: 31.3 g/dL (ref 30.0–36.0)
MCV: 88.6 fL (ref 80.0–100.0)
Monocytes Absolute: 0.7 10*3/uL (ref 0.1–1.0)
Monocytes Relative: 13 %
Neutro Abs: 3.6 10*3/uL (ref 1.7–7.7)
Neutrophils Relative %: 65 %
Platelet Count: 130 10*3/uL — ABNORMAL LOW (ref 150–400)
RBC: 4.11 MIL/uL — ABNORMAL LOW (ref 4.22–5.81)
RDW: 19.4 % — ABNORMAL HIGH (ref 11.5–15.5)
WBC Count: 5.5 10*3/uL (ref 4.0–10.5)
nRBC: 0 % (ref 0.0–0.2)

## 2022-01-16 LAB — CMP (CANCER CENTER ONLY)
ALT: 10 U/L (ref 0–44)
AST: 13 U/L — ABNORMAL LOW (ref 15–41)
Albumin: 3.7 g/dL (ref 3.5–5.0)
Alkaline Phosphatase: 90 U/L (ref 38–126)
Anion gap: 4 — ABNORMAL LOW (ref 5–15)
BUN: 12 mg/dL (ref 8–23)
CO2: 32 mmol/L (ref 22–32)
Calcium: 9.2 mg/dL (ref 8.9–10.3)
Chloride: 106 mmol/L (ref 98–111)
Creatinine: 0.73 mg/dL (ref 0.61–1.24)
GFR, Estimated: >60 mL/min (ref >60)
Glucose, Bld: 80 mg/dL (ref 70–99)
Potassium: 4 mmol/L (ref 3.5–5.1)
Sodium: 142 mmol/L (ref 135–145)
Total Bilirubin: 0.4 mg/dL (ref 0.3–1.2)
Total Protein: 6.9 g/dL (ref 6.5–8.1)

## 2022-01-17 ENCOUNTER — Encounter (HOSPITAL_COMMUNITY): Payer: Self-pay

## 2022-01-17 ENCOUNTER — Ambulatory Visit (HOSPITAL_COMMUNITY)
Admission: RE | Admit: 2022-01-17 | Discharge: 2022-01-17 | Disposition: A | Discharge status: Home / Self Care | Payer: BC Managed Care – PPO | Source: Ambulatory Visit | Attending: Internal Medicine | Admitting: Internal Medicine

## 2022-01-17 DIAGNOSIS — D492 Neoplasm of unspecified behavior of bone, soft tissue, and skin: Secondary | ICD-10-CM | POA: Insufficient documentation

## 2022-01-17 MED ORDER — SODIUM CHLORIDE (PF) 0.9 % IJ SOLN
INTRAMUSCULAR | Status: AC
  Filled 2022-01-17: qty 50

## 2022-01-17 MED ORDER — IOHEXOL 300 MG/ML  SOLN
75.0000 mL | Freq: Once | INTRAMUSCULAR | Status: AC | PRN
  Administered 2022-01-17: 75 mL via INTRAVENOUS

## 2022-01-20 ENCOUNTER — Telehealth: Payer: Self-pay | Admitting: Medical Oncology

## 2022-01-20 ENCOUNTER — Inpatient Hospital Stay (HOSPITAL_BASED_OUTPATIENT_CLINIC_OR_DEPARTMENT_OTHER): Payer: BC Managed Care – PPO | Admitting: Internal Medicine

## 2022-01-20 VITALS — BP 163/96 | HR 76 | Temp 98.1°F | Resp 17 | Wt 172.0 lb

## 2022-01-20 DIAGNOSIS — D492 Neoplasm of unspecified behavior of bone, soft tissue, and skin: Secondary | ICD-10-CM | POA: Diagnosis not present

## 2022-01-20 DIAGNOSIS — C3491 Malignant neoplasm of unspecified part of right bronchus or lung: Secondary | ICD-10-CM | POA: Diagnosis not present

## 2022-01-20 NOTE — Progress Notes (Signed)
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 Roxan Hockey. 2) Sunitinib 3 7.5 mg p.o. daily.  First dose started April 08, 2021.  Status post 3 months of treatment.  This treatment was discontinued secondary to disease progression. 3) Votrient (pazopanib) 800 mg p.o. daily.  Started August 10, 2021.  Status post 2 months of treatment discontinued secondary to disease progression. 4) status post redo right thoracotomy with resection of pleural tumors and lymph node dissection under the care of Dr. Roxan Hockey on Nov 13, 2021.  CURRENT THERAPY: None  INTERVAL HISTORY: Mathew [name] 61 y.o. male returns to the clinic today for follow-up visit accompanied by his wife.  The patient continues to have pain on the right side of the chest.  He also has mild fatigue and weakness.  He was referred to Dr. Posey Pronto at [facility] but has not received an appointment with them yet.  The patient has no shortness of breath, cough or hemoptysis.  He denied having any fever or chills.  He has no nausea, vomiting, diarrhea or constipation.  He has no headache or visual changes.  He had repeat CT scan of the chest performed recently and he is here for evaluation and discussion of his scan results.  MEDICAL HISTORY: Past Medical History:  Diagnosis Date   Arthritis    osteo   Asthma    asthma as a child   Clubbing of nails    Deafness in left ear    GERD (gastroesophageal reflux disease)    History of kidney stones    Pneumonia    Shortness of breath dyspnea    just initially   Vitamin D deficiency    White coat syndrome with high blood  pressure without hypertension    4 years ago    ALLERGIES:  is allergic to aspirin and penicillins.  MEDICATIONS:  Current Outpatient Medications  Medication Sig Dispense Refill   amLODipine (NORVASC) 5 MG tablet Take 10 mg by mouth daily.     Cholecalciferol (VITAMIN D3) 50 MCG (2000 UT) capsule Take 1 capsule (2,000 Units total) by mouth daily. 1 capsule 0   FEROCON capsule TAKE 1 CAPSULE BY MOUTH 2 (TWO) TIMES DAILY AFTER A MEAL. 60 capsule 1   gabapentin (NEURONTIN) 300 MG capsule Take 1 capsule (300 mg total) by mouth 3 (three) times daily. For neuropathic pain. 100 capsule 1   traMADol (ULTRAM) 50 MG tablet TAKE 1 TABLET (50 MG TOTAL) BY MOUTH EVERY 6 (SIX) HOURS AS NEEDED FOR UP TO 7 DAYS (MILD PAIN).     No current facility-administered medications for this visit.    SURGICAL HISTORY:  Past Surgical History:  Procedure Laterality Date   INTERCOSTAL NERVE BLOCK Right 04/27/2020   Procedure: INTERCOSTAL NERVE BLOCK;  Surgeon: [name], MD;  Location: [facility];  Service: Thoracic;  Laterality: Right;   KNEE CARTILAGE SURGERY Left 1976   pleural tumor Right 01/22/2016   resection of solitary fibrous tumor   RESECTION OF MEDIASTINAL MASS Right 01/23/2016   this is in error- no mediastinal tumor   RESECTION OF MEDIASTINAL MASS Right 11/13/2021   Procedure:  RESECTION OF PLEURAL TUMORS;  Surgeon: [name], MD;  Location[facility] OR;  Service: Thoracic;  Laterality: Right;   THORACOTOMY Right 04/27/2020   Procedure: THORACOTOMY;  Surgeon: [name], MD;  Location: [facility];  Service: Thoracic;  Laterality: Right;   THORACOTOMY Right 11/13/2021   Procedure: REDO THORACOTOMY;  Surgeon: [name], MD;  Location: [facility];  Service: Thoracic;  Laterality: Right;   TONSILLECTOMY     VIDEO BRONCHOSCOPY WITH ENDOBRONCHIAL ULTRASOUND N/A 12/17/2015   Procedure: VIDEO BRONCHOSCOPY WITH ENDOBRONCHIAL ULTRASOUND;  Surgeon: [name], MD;  Location: [facility];  Service: Thoracic;  Laterality: N/A;    REVIEW OF SYSTEMS:  Constitutional: positive for fatigue Eyes: negative Ears, nose, mouth, throat, and face: negative Respiratory: positive for pleurisy/chest pain Cardiovascular: negative Gastrointestinal: negative Genitourinary:negative Integument/breast: negative Hematologic/lymphatic: negative Musculoskeletal:negative Neurological: negative Behavioral/Psych: negative Endocrine: negative Allergic/Immunologic: negative   PHYSICAL EXAMINATION: General appearance: alert, cooperative, fatigued, and no distress Head: Normocephalic, without obvious abnormality, atraumatic Neck: no adenopathy, no JVD, supple, symmetrical, trachea midline, and thyroid not enlarged, symmetric, no tenderness/mass/nodules Lymph nodes: Cervical, supraclavicular, and axillary nodes normal. Resp: clear to auscultation bilaterally Back: symmetric, no curvature. ROM normal. No CVA tenderness. Cardio: regular rate and rhythm, S1, S2 normal, no murmur, click, rub or gallop GI: soft, non-tender; bowel sounds normal; no masses,  no organomegaly Extremities: extremities normal, atraumatic, no cyanosis or edema Neurologic: Alert and oriented X 3, normal strength and tone. Normal symmetric reflexes. Normal coordination and gait  ECOG PERFORMANCE STATUS: 1 - Symptomatic but completely ambulatory  Blood pressure (!) 163/96, pulse 76, temperature 98.1 F (36.7 C), temperature source Oral, resp. rate 17, weight 172 lb (78 kg), SpO2 97 %.  LABORATORY DATA: Lab Results  Component Value Date   WBC 5.5 01/16/2022   HGB 11.4 (L) 01/16/2022   HCT 36.4 (L) 01/16/2022   MCV 88.6 01/16/2022   PLT 130 (L) 01/16/2022      Chemistry      Component Value Date/Time   NA 142 01/16/2022 0949   K 4.0 01/16/2022 0949   CL 106 01/16/2022 0949   CO2 32 01/16/2022 0949   BUN 12 01/16/2022 0949   CREATININE 0.73 01/16/2022 0949      Component Value Date/Time   CALCIUM 9.2  01/16/2022 0949   ALKPHOS 90 01/16/2022 0949   AST 13 (L) 01/16/2022 0949   ALT 10 01/16/2022 0949   BILITOT 0.4 01/16/2022 0949       RADIOGRAPHIC STUDIES: CT Chest W Contrast  Result Date: 01/17/2022 CLINICAL DATA:  Musculoskeletal neoplasm. Solitary fibrous tumor diagnosed 2017. Chemotherapy complete. RIGHT rib lesions. * Tracking Code: BO * EXAM: CT CHEST WITH CONTRAST TECHNIQUE: Multidetector CT imaging of the chest was performed during intravenous contrast administration. RADIATION DOSE REDUCTION: This exam was performed according to the departmental dose-optimization program which includes automated exposure control, adjustment of the mA and/or kV according to patient size and/or use of iterative reconstruction technique. CONTRAST:  81m OMNIPAQUE IOHEXOL 300 MG/ML  SOLN COMPARISON:  CT 10/08/2021 FINDINGS: Cardiovascular: No pericardial effusion. Coronary artery calcification and aortic atherosclerotic calcification. Mediastinum/Nodes: No discrete mediastinal or hilar adenopathy. Paramediastinal mass along the superior vena cava is favored a RIGHT pleural lesion. Lungs/Pleura: Significant progression of pleural metastatic disease. Dominant mass in the inferior RIGHT pleural space measures 10.0 x 6.5 cm (image 150/2). This mass in the inferior RIGHT pleural sulcus of the lung invades the intracostal space (image 160/2). In the medial RIGHT lower lobe  interval enlargement of round pleural base nodule measuring 3.6 x 3.5 cm. Several1.5 cm nodules were present on comparison CT. The entire posterior RIGHT pleural space is studded with nodules (image 141/2). Along the mediastinal border of the RIGHT upper lobe there is a 3.3 cm enhancing mass adjacent to the SVC increased from 1.2 cm on prior. Pleural nodule beneath the mid sternum on image 92/2. There is a atelectasis of the RIGHT lower lobe. LEFT lung is clear. Upper Abdomen: Limited view of the liver, kidneys, pancreas are unremarkable. Normal  adrenal glands. Musculoskeletal: No aggressive osseous lesion. Postsurgical change in posterior RIGHT ribs. IMPRESSION: 1. Unfortunately, marked progression of pleural metastasis involving the near entirety of the RIGHT pleural space. Dominant mass lesion in the inferolateral RIGHT pleural space measures 10 cm and extends into adjacent intracostal spaces. 2. LEFT lung is clear. 3. No skeletal metastasis. 4. No upper abdominal metastasis. These results will be called to the ordering clinician or representative by the Radiologist Assistant, and communication documented in the PACS or [employer]. Electronically Signed   By: Suzy Bouchard M.D.   On: 01/17/2022 11:32   MR PROSTATE W WO CONTRAST  Result Date: 01/10/2022 CLINICAL DATA:  Elevated PSA level of 5.4 on 06/20/2021. R97.20 prostate cancer. EXAM: MR PROSTATE WITHOUT AND WITH CONTRAST TECHNIQUE: Multiplanar multisequence MRI images were obtained of the pelvis centered about the prostate. Pre and post contrast images were obtained. CONTRAST:  87m GADAVIST GADOBUTROL 1 MMOL/ML IV SOLN COMPARISON:  None Available. FINDINGS: Prostate: Region of interest # 1: PI-RADS category 5 lesion of the right posterolateral peripheral zone in the mid gland and apex, and right posteromedial peripheral zone in the apex, with abnormal reduced T2 signal (image 15, series 9), low ADC map activity and restricted diffusion (image 18 of series 7 and 8), and focal early enhancement (image 186, series 12). This measures 1.80 cc (2.0 by 1.4 by 1.5 cm). Hazy low T2 signal is present throughout the peripheral zone, likely postinflammatory and considered PI-RADS category 2. Mild nodularity in the transition zone. Volume: 3D volumetric analysis: Prostate volume 23.69 cc (4.2 by 3.5 by 3.5 cm). Transcapsular spread:  Absent Seminal vesicle involvement: Absent Neurovascular bundle involvement: Absent Pelvic adenopathy: Absent Bone metastasis: Absent Other findings: No other  significant findings. IMPRESSION: 1. PI-RADS category 5 lesion in the right peripheral zone. Targeting data sent to [facility] Electronically Signed   By: WVan ClinesM.D.   On: 01/10/2022 14:21   DG Chest 2 View  Result Date: 01/09/2022 CLINICAL DATA:  Resection of right pleural mass, solitary fibrous tumor EXAM: CHEST - 2 VIEW COMPARISON:  Previous studies including the examination of 12/03/2021 FINDINGS: Transverse diameter of HElnoria Howardis increased. There is increased density in right lower lung field obscuring the right hemidiaphragm. There is blunting of right lateral CP angle. Linear densities are seen in right lower lung field. Right lung appears smaller than the left. There are no new infiltrates or signs of pulmonary edema. Surgical clips are seen in right apex, medial right upper lung field and right chest wall. IMPRESSION: Cardiomegaly. There are no signs of pulmonary edema or new focal infiltrates. Right lung is smaller than left. Increased density in right lower lung field may be due to right pleural effusion and possibly underlying atelectasis/pneumonia. No significant interval changes are noted since 12/03/2021. Electronically Signed   By: PElmer PickerM.D.   On: 01/09/2022 16:58    ASSESSMENT AND PLAN: This is a very pleasant 61years old  male with recurrent solitary fibrous tumor of the right lung diagnosed in June 2017 s/p resection followed by recurrence in October 2021 status post reresection under the care of Dr. Roxan Hockey. The patient has been on observation since his resection.  There was no role for adjuvant therapy for his condition.  He was seen by Dr. Kendall Flack at [facility] who recommended for him observation and consideration of treatment with sunitinib followed by surgery if the patient has disease recurrence He had repeat CT scan of the chest performed recently.  The scan showed stable disease except for a new 1.4 cm nodular component posteriorly. The patient  is currently undergoing treatment with sunitinib 37.5 mg p.o. daily status post 3 months of treatment.  The patient has been tolerating his treatment with Sutent fairly well except for the hypertension. Unfortunately has a scan showed evidence for disease progression with interval development of multiple new pleural-based nodules in the right hemithorax measuring up to 2.9 cm concerning for recurrent/metastatic disease. The patient was seen recently by Dr. Roxan Hockey who offered surgical resection by Dr. Kendall Flack recommended neoadjuvant treatment with pazopanib for 8 weeks followed by surgical resection if the patient has a stable or improvement of his disease followed by adjuvant treatment with Pazopanib for another 6-8 months. He was treated with pazopanib 800 mg p.o. daily.  He is status post 2 months of treatment. Repeat CT scan showed clear evidence for disease progression with enlarging right pleural-based masses. The patient was referred to Dr. 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  I [facility] the tissue block to [facility] for molecular studies and PD-L1 expression.  Unfortunately the molecular studies showed no actionable mutations and his PD-L1 expression was negative. The patient is currently on observation and he has been waiting on an appointment from [facility] with Dr. Posey Pronto but this has not been scheduled yet. He was referred in the past with Dr. Angelina Ok at [facility] but his insurance did not cover that referral.  He is willing to try again with his new insurance to see if he would be able to see Dr. Angelina Ok for a second opinion because he cannot wait that long for the [facility] appointment. He had repeat CT scan of the chest performed recently.  I personally  and independently reviewed the scan images and discussed the results with the patient and his wife. I will refer the patient back to Dr. Angelina Ok to see if he has any good option for treatment of his condition but if there is significant delay and the consultation, I would consider him for treatment with with Temodar on days 1-7 and 15-22 with bevacizumab on days 1 and 8 every 4 weeks. The patient will call me back in the next 1-2 weeks with his final decision regarding the treatment. He was advised to call immediately if he has any other concerning symptoms in the interval.  The patient voices understanding of current disease status and treatment options and is in agreement with the current care plan.  All questions were answered. The patient knows to call the clinic with any problems, questions or concerns. We can certainly see the patient much sooner if necessary.   Disclaimer: This note was dictated with voice recognition software. Similar sounding words can inadvertently be transcribed and may  not be corrected upon review.

## 2022-01-20 NOTE — Progress Notes (Signed)
Ambulatory referral faxed to [facility] Dr. Angelina Ok. Demographics faxed/progress note and pathology faxed through [facility].

## 2022-01-20 NOTE — Telephone Encounter (Signed)
[facility] can start treatment 03/18/22.

## 2022-01-21 ENCOUNTER — Encounter: Payer: Self-pay | Admitting: Medical Oncology

## 2022-01-27 ENCOUNTER — Encounter: Payer: Self-pay | Admitting: Urology

## 2022-01-27 ENCOUNTER — Ambulatory Visit: Payer: BC Managed Care – PPO | Admitting: Urology

## 2022-01-27 VITALS — BP 172/86 | HR 81 | Ht 69.0 in | Wt 167.0 lb

## 2022-01-27 DIAGNOSIS — R972 Elevated prostate specific antigen [PSA]: Secondary | ICD-10-CM

## 2022-01-27 DIAGNOSIS — D492 Neoplasm of unspecified behavior of bone, soft tissue, and skin: Secondary | ICD-10-CM

## 2022-01-27 NOTE — Patient Instructions (Signed)
Based on your increasing PSA and the prostate MRI findings, we are concerned that you have prostate cancer.  With your rare pulmonary tumor that has recurred quickly over the last few months despite surgery, there is not an easy or straightforward answer in terms of whether to continue to follow the PSA and just monitor this versus pursue a prostate biopsy and potentially more aggressive treatments.  I will reach out to your doctors at [facility] next week to try to come up with a plan about the next steps with your complex history.  We certainly do not want to ignore potential prostate cancer, but at the same time we do not want to put you through a biopsy or more aggressive treatments that can negatively affect your quality of life if this is something that would spread slowly and not give you problems over the next 5 to 15 years.  Prostate Cancer  The prostate is a small gland that produces fluid that makes up semen (seminal fluid). It is located below the bladder in men, in front of the rectum. Prostate cancer is the abnormal growth of cells in the prostate gland. What are the causes? The exact cause of this condition is not known. What increases the risk? You are more likely to develop this condition if: You are 31 years of age or older. You have a family history of prostate cancer. You have a family history of breast and ovarian cancer. You have genes that are passed from parent to child (inherited), such as BRCA1 and BRCA2. You have Lynch syndrome. African American men and men of African descent are diagnosed with prostate cancer at higher rates than other men. The reasons for this are not well understood and are likely due to a combination of genetic and environmental factors. What are the signs or symptoms? Symptoms of this condition include: Problems with urination. This may include: A weak or interrupted flow of urine. Trouble starting or stopping urination. Trouble emptying the bladder all  the way. The need to urinate more often, especially at night. Blood in urine or semen. Persistent pain or discomfort in the lower back, lower abdomen, or hips. Trouble getting an erection. Weakness or numbness in the legs or feet. How is this diagnosed? This condition can be diagnosed with: A digital rectal exam. For this exam, a health care provider inserts a gloved finger into the rectum to feel the prostate gland. A blood test called a prostate-specific antigen (PSA) test. A procedure in which a sample of tissue is taken from the prostate and checked under a microscope (prostate biopsy). An imaging test called transrectal ultrasonography. Once the condition is diagnosed, tests will be done to determine how far the cancer has spread. This is called staging the cancer. Staging may involve imaging tests, such as a bone scan, CT scan, PET scan, or MRI. Stages of prostate cancer The stages of prostate cancer are as follows: Stage 1 (I). At this stage, the cancer is found in the prostate only. The cancer is not visible on imaging tests, and it is usually found by accident, such as during prostate surgery. Stage 2 (II). At this stage, the cancer is more advanced than it is in stage 1, but the cancer has not spread outside the prostate. Stage 3 (III). At this stage, the cancer has spread beyond the outer layer of the prostate to nearby tissues. The cancer may be found in the seminal vesicles, which are near the bladder and the prostate. Stage  4 (IV). At this stage, the cancer has spread to other parts of the body, such as the lymph nodes, bones, bladder, rectum, liver, or lungs. Prostate cancer grading Prostate cancer is also graded according to how the cancer cells look under a microscope. This is called the Gleason score and the total score can range from 6-10, indicating how likely it is that the cancer will spread (metastasize) to other parts of the body. The higher the score, the greater the  likelihood that the cancer will spread. Gleason 6 or lower: This indicates that the cancer cells look similar to normal prostate cells (well differentiated). Gleason 7: This indicates that the cancer cells look somewhat similar to normal prostate cells (moderately differentiated). Gleason 8, 9, or 10: This indicates that the cancer cells look very different than normal prostate cells (poorly differentiated). How is this treated? Treatment for this condition depends on several factors, including the stage of the cancer, your age, personal preferences, and your overall health. Talk with your health care provider about treatment options that are recommended for you. Common treatments include: Observation for early stage prostate cancer (active surveillance). This involves having exams, blood tests, and in some cases, more biopsies. For some men, this is the only treatment needed. Surgery. Types of surgeries include: Open surgery (radical prostatectomy). In this surgery, a larger incision is made to remove the prostate. A laparoscopic radical prostatectomy. This is a surgery to remove the prostate and lymph nodes through several small incisions. It is often referred to as a minimally invasive surgery. A robotic radical prostatectomy. This is laparoscopic surgery to remove the prostate and lymph nodes with the help of robotic arms that are controlled by the surgeon. Cryoablation. This is surgery to freeze and destroy cancer cells. Radiation treatment. Types of radiation treatment include: External beam radiation. This type aims beams of radiation from outside the body at the prostate to destroy cancerous cells. Brachytherapy. This type uses radioactive needles, seeds, wires, or tubes that are implanted into the prostate gland. Like external beam radiation, brachytherapy destroys cancerous cells. An advantage is that this type of radiation limits the damage to surrounding tissue and has fewer side  effects. Chemotherapy. This treatment kills cancer cells or stops them from multiplying. It kills both cancer cells and normal cells. Targeted therapy. This treatment uses medicines to kill cancer cells without damaging normal cells. Hormone treatment. This treatment involves taking medicines that act on testosterone, one of the male hormones, by: Stopping your body from producing testosterone. Blocking testosterone from reaching cancer cells. Follow these instructions at home: Lifestyle Do not use any products that contain nicotine or tobacco. These products include cigarettes, chewing tobacco, and vaping devices, such as e-cigarettes. If you need help quitting, ask your health care provider. Eat a healthy diet. To do this: Eat foods that are high in fiber. These include beans, whole grains, and fresh fruits and vegetables. Limit foods that are high in fat and sugar. These include fried or sweet foods. Treatment for prostate cancer may affect sexual function. If you have a partner, continue to have intimate moments. This may include touching, holding, hugging, and caressing your partner. Get plenty of sleep. Consider joining a support group for men who have prostate cancer. Meeting with a support group may help you learn to manage the stress of having cancer. General instructions Take over-the-counter and prescription medicines only as told by your health care provider. If you have to go to the hospital, notify your cancer specialist (  oncologist). Keep all follow-up visits. This is important. Where to find more information American Cancer Society: www.cancer.[place] of Clinical Oncology: www.cancer.net [employer]: www.cancer.gov Contact a health care provider if: You have new or increasing trouble urinating. You have new or increasing blood in your urine. You have new or increasing pain in your hips, back, or chest. Get help right away if: You have weakness or  numbness in your legs. You cannot control urination or your bowel movements (incontinence). You have chills or a fever. Summary The prostate is a small gland that is involved in the production of semen. It is located below a man's bladder, in front of the rectum. Prostate cancer is the abnormal growth of cells in the prostate gland. Treatment for this condition depends on the stage of the cancer, your age, personal preferences, and your overall health. Talk with your health care provider about treatment options that are recommended for you. Consider joining a support group for men who have prostate cancer. Meeting with a support group may help you learn to manage the stress of having cancer. This information is not intended to replace advice given to you by your health care provider. Make sure you discuss any questions you have with your health care provider. Document Revised: 08/29/2020 Document Reviewed: 08/29/2020 Elsevier Patient Education  [employer].

## 2022-01-27 NOTE — Progress Notes (Signed)
   01/27/2022 8:42 AM   Mathew [name] [birth year] [id number]  Reason for visit: Follow up elevated PSA, abnormal prostate MRI, pulmonary fibrous tumor  HPI: 61 year old male who is otherwise extremely healthy but was diagnosed with a rare solitary pulmonary fibrous tumor in 2016 treated with surgery that recurred and 2021 requiring repeat surgery and was most recently on sunitinib.  Unfortunately, on CT chest from April 2023 he was found to have significant progression of his disease.  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  I personally viewed and interpreted those images.  He is currently not on any systemic treatments, and has an appointment on Thursday 01/30/22 at [facility] with Dr. Angelina Ok to discuss alternative treatment options.  I have been following him for a elevated PSA.  PSA was 2.1 in October 2020, 3.8 in October 2021, 5 in October 2020, 5.4(9% free) in January 2023, and most recently 8.1 in April 2023.  Calculated PSA doubling time is 18 months.  Using shared decision making we had opted for a prostate MRI.  I personally viewed and interpreted those images dated 01/10/2022 that show a 24 g gland with a 2 cm PI-RADS category 5 lesion concerning for prostate cancer in the right posterior lateral peripheral zone in the mid gland and apex.  We again discussed options at length with his complex history and recurrence of pulmonary fibrous tumor.  \I had a very frank conversation with the patient and his wife that if his prognosis from the pulmonary fibrous tumor is less than a few years, I would recommend deferring biopsy and further aggressive treatments for his suspected slower growing prostate cancer at this time.  We reviewed risks of prostate biopsy  including bleeding and infection, sepsis, and even very rarely death, as well as risks of potential prostate cancer treatments from surgery or radiation including incontinence, ED, lower urinary tract symptoms, bleeding/hematuria, and bowel symptoms.  Using shared decision making, we opted to wait and see what treatment options he has for his aggressive pulmonary fibrous tumor after his visit at [facility] this week, and will have a virtual visit next week to re-discuss pursuing prostate biopsy versus trending the PSA pending overall prognosis from pulmonary fibrous tumor.  I will also reach out to his oncologist at [facility] Dr. Angelina Ok to try to get a better sense of prognosis.    [name], [facility] [address] [place], [place] [address] [phone]

## 2022-01-30 ENCOUNTER — Encounter: Payer: Self-pay | Admitting: Internal Medicine

## 2022-02-03 ENCOUNTER — Encounter: Payer: Self-pay | Admitting: Medical Oncology

## 2022-02-03 ENCOUNTER — Other Ambulatory Visit: Payer: Self-pay | Admitting: Medical Oncology

## 2022-02-03 ENCOUNTER — Other Ambulatory Visit: Payer: Self-pay | Admitting: Internal Medicine

## 2022-02-03 DIAGNOSIS — I878 Other specified disorders of veins: Secondary | ICD-10-CM

## 2022-02-04 ENCOUNTER — Telehealth: Payer: BC Managed Care – PPO | Admitting: Urology

## 2022-02-07 ENCOUNTER — Encounter: Payer: Self-pay | Admitting: Internal Medicine

## 2022-02-07 ENCOUNTER — Other Ambulatory Visit: Payer: Self-pay | Admitting: Thoracic Surgery (Cardiothoracic Vascular Surgery)

## 2022-02-10 ENCOUNTER — Other Ambulatory Visit: Payer: Self-pay

## 2022-02-10 ENCOUNTER — Inpatient Hospital Stay (HOSPITAL_BASED_OUTPATIENT_CLINIC_OR_DEPARTMENT_OTHER): Payer: BC Managed Care – PPO | Admitting: Internal Medicine

## 2022-02-10 VITALS — BP 161/81 | HR 90 | Temp 98.1°F | Resp 20 | Wt 176.6 lb

## 2022-02-10 DIAGNOSIS — D492 Neoplasm of unspecified behavior of bone, soft tissue, and skin: Secondary | ICD-10-CM

## 2022-02-10 DIAGNOSIS — C3491 Malignant neoplasm of unspecified part of right bronchus or lung: Secondary | ICD-10-CM | POA: Diagnosis not present

## 2022-02-10 NOTE — Progress Notes (Signed)
DISCONTINUE ON PATHWAY REGIMEN - Sarcoma     A cycle is every 28 days:     Sunitinib   **Always confirm dose/schedule in your pharmacy ordering system**  REASON: Disease Progression PRIOR TREATMENT: [id number]: Sunitinib 37.5 mg Daily Continuously Until Progression or Unacceptable Toxicity TREATMENT RESPONSE: Progressive Disease (PD)  START OFF PATHWAY REGIMEN - Sarcoma   [id number]:Doxorubicin 25 mg/m2 D1-3 + Ifosfamide 3 grams/m2 D1-3 + Mesna D1-3 + G-CSF D4 q21 Days:   A cycle is every 21 days :     Doxorubicin      Mesna      Ifosfamide      Mesna      Mesna      [id number]-[id number]   **Always confirm dose/schedule in your pharmacy ordering system**  Patient Characteristics: Other Sarcoma Subtypes, Unresectable / Metastatic, Solitary Fibrous Tumor Histology/Anatomic Site: Solitary Fibrous Tumor  Intent of Therapy: Non-Curative / Palliative Intent, Discussed with Patient

## 2022-02-10 NOTE — Progress Notes (Signed)
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 Roxan Hockey. 2) Sunitinib 3 7.5 mg p.o. daily.  First dose started April 08, 2021.  Status post 3 months of treatment.  This treatment was discontinued secondary to disease progression. 3) Votrient (pazopanib) 800 mg p.o. daily.  Started August 10, 2021.  Status post 2 months of treatment discontinued secondary to disease progression. 4) status post redo right thoracotomy with resection of pleural tumors and lymph node dissection under the care of Dr. Roxan Hockey on Nov 13, 2021.  CURRENT THERAPY: Systemic chemotherapy with doxorubicin 25 Mg/M2, ifosfamide 2500 Mg/M2 and mesna 2500 Mg/M2 days 1-3 every 3 weeks.  First dose February 12, 2022 at [facility].  INTERVAL HISTORY: Mathew [name] 61 y.o. male returns to the clinic today for follow-up visit accompanied by his wife.  The patient continues to complain of increasing fatigue and weakness as well as the shortness of breath especially at nighttime.  He also has intermittent right-sided chest pain.  He was seen recently by Dr. Angelina Ok at [facility] and he recommended for him treatment with doxorubicin, ifosfamide and mesna.  He is expected to start the first dose of this treatment at [facility].  He has no current nausea, vomiting, diarrhea or constipation.  He had 2D echo that showed ejection fraction of 55-65%.  He also has a Port-A-Cath placed for IV access.  He is expected to start the first dose of this treatment on February 12, 2022 at [facility].  MEDICAL HISTORY: Past Medical History:   Diagnosis Date   Arthritis    osteo   Asthma    asthma as a child   Clubbing of nails    Deafness in left ear    GERD (gastroesophageal reflux disease)    History of kidney stones    Pneumonia    Shortness of breath dyspnea    just initially   Vitamin D deficiency    White coat syndrome with high blood pressure without hypertension    4 years ago    ALLERGIES:  is allergic to aspirin and penicillins.  MEDICATIONS:  Current Outpatient Medications  Medication Sig Dispense Refill   amLODipine (NORVASC) 5 MG tablet Take 10 mg by mouth daily.     Cholecalciferol (VITAMIN D3) 50 MCG (2000 UT) capsule Take 1 capsule (2,000 Units total) by mouth daily. 1 capsule 0   FEROCON capsule TAKE 1 CAPSULE BY MOUTH 2 (TWO) TIMES DAILY AFTER A MEAL. 60 capsule 1   No current facility-administered medications for this visit.    SURGICAL HISTORY:  Past Surgical History:  Procedure Laterality Date   INTERCOSTAL NERVE BLOCK Right 04/27/2020   Procedure: INTERCOSTAL NERVE BLOCK;  Surgeon: [name], MD;  Location: [facility];  Service: Thoracic;  Laterality: Right;   KNEE CARTILAGE SURGERY Left 1976   pleural tumor Right 01/22/2016   resection of solitary fibrous tumor   RESECTION OF MEDIASTINAL MASS Right 01/23/2016   this is in error- no mediastinal tumor   RESECTION OF MEDIASTINAL MASS Right 11/13/2021   Procedure: RESECTION OF PLEURAL TUMORS;  Surgeon: [name],  MD;  Location: [facility];  Service: Thoracic;  Laterality: Right;   THORACOTOMY Right 04/27/2020   Procedure: THORACOTOMY;  Surgeon: [name], MD;  Location: [facility];  Service: Thoracic;  Laterality: Right;   THORACOTOMY Right 11/13/2021   Procedure: REDO THORACOTOMY;  Surgeon: [name], MD;  Location: [facility];  Service: Thoracic;  Laterality: Right;   TONSILLECTOMY     VIDEO BRONCHOSCOPY WITH ENDOBRONCHIAL ULTRASOUND N/A 12/17/2015   Procedure: VIDEO BRONCHOSCOPY WITH ENDOBRONCHIAL ULTRASOUND;   Surgeon: [name], MD;  Location: [facility];  Service: Thoracic;  Laterality: N/A;    REVIEW OF SYSTEMS:  Constitutional: positive for fatigue Eyes: negative Ears, nose, mouth, throat, and face: negative Respiratory: positive for dyspnea on exertion and pleurisy/chest pain Cardiovascular: negative Gastrointestinal: negative Genitourinary:negative Integument/breast: negative Hematologic/lymphatic: negative Musculoskeletal:negative Neurological: negative Behavioral/Psych: negative Endocrine: negative Allergic/Immunologic: negative   PHYSICAL EXAMINATION: General appearance: alert, cooperative, fatigued, and no distress Head: Normocephalic, without obvious abnormality, atraumatic Neck: no adenopathy, no JVD, supple, symmetrical, trachea midline, and thyroid not enlarged, symmetric, no tenderness/mass/nodules Lymph nodes: Cervical, supraclavicular, and axillary nodes normal. Resp: diminished breath sounds RLL and dullness to percussion RLL Back: symmetric, no curvature. ROM normal. No CVA tenderness. Cardio: regular rate and rhythm, S1, S2 normal, no murmur, click, rub or gallop GI: soft, non-tender; bowel sounds normal; no masses,  no organomegaly Extremities: extremities normal, atraumatic, no cyanosis or edema Neurologic: Alert and oriented X 3, normal strength and tone. Normal symmetric reflexes. Normal coordination and gait  ECOG PERFORMANCE STATUS: 1 - Symptomatic but completely ambulatory  Blood pressure (!) 161/81, pulse 90, temperature 98.1 F (36.7 C), temperature source Oral, resp. rate 20, weight 176 lb 9.6 oz (80.1 kg), SpO2 99 %.  LABORATORY DATA: Lab Results  Component Value Date   WBC 5.5 01/16/2022   HGB 11.4 (L) 01/16/2022   HCT 36.4 (L) 01/16/2022   MCV 88.6 01/16/2022   PLT 130 (L) 01/16/2022      Chemistry      Component Value Date/Time   NA 142 01/16/2022 0949   K 4.0 01/16/2022 0949   CL 106 01/16/2022 0949   CO2 32 01/16/2022 0949   BUN  12 01/16/2022 0949   CREATININE 0.73 01/16/2022 0949      Component Value Date/Time   CALCIUM 9.2 01/16/2022 0949   ALKPHOS 90 01/16/2022 0949   AST 13 (L) 01/16/2022 0949   ALT 10 01/16/2022 0949   BILITOT 0.4 01/16/2022 0949       RADIOGRAPHIC STUDIES: CT Chest W Contrast  Result Date: 01/17/2022 CLINICAL DATA:  Musculoskeletal neoplasm. Solitary fibrous tumor diagnosed 2017. Chemotherapy complete. RIGHT rib lesions. * Tracking Code: BO * EXAM: CT CHEST WITH CONTRAST TECHNIQUE: Multidetector CT imaging of the chest was performed during intravenous contrast administration. RADIATION DOSE REDUCTION: This exam was performed according to the departmental dose-optimization program which includes automated exposure control, adjustment of the mA and/or kV according to patient size and/or use of iterative reconstruction technique. CONTRAST:  40m OMNIPAQUE IOHEXOL 300 MG/ML  SOLN COMPARISON:  CT 10/08/2021 FINDINGS: Cardiovascular: No pericardial effusion. Coronary artery calcification and aortic atherosclerotic calcification. Mediastinum/Nodes: No discrete mediastinal or hilar adenopathy. Paramediastinal mass along the superior vena cava is favored a RIGHT pleural lesion. Lungs/Pleura: Significant progression of pleural metastatic disease. Dominant mass in the inferior RIGHT pleural space measures 10.0 x 6.5 cm (image 150/2). This mass in the inferior RIGHT pleural sulcus of the lung invades the intracostal space (image 160/2). In the medial RIGHT  lower lobe interval enlargement of round pleural base nodule measuring 3.6 x 3.5 cm. Several1.5 cm nodules were present on comparison CT. The entire posterior RIGHT pleural space is studded with nodules (image 141/2). Along the mediastinal border of the RIGHT upper lobe there is a 3.3 cm enhancing mass adjacent to the SVC increased from 1.2 cm on prior. Pleural nodule beneath the mid sternum on image 92/2. There is a atelectasis of the RIGHT lower lobe. LEFT lung  is clear. Upper Abdomen: Limited view of the liver, kidneys, pancreas are unremarkable. Normal adrenal glands. Musculoskeletal: No aggressive osseous lesion. Postsurgical change in posterior RIGHT ribs. IMPRESSION: 1. Unfortunately, marked progression of pleural metastasis involving the near entirety of the RIGHT pleural space. Dominant mass lesion in the inferolateral RIGHT pleural space measures 10 cm and extends into adjacent intracostal spaces. 2. LEFT lung is clear. 3. No skeletal metastasis. 4. No upper abdominal metastasis. These results will be called to the ordering clinician or representative by the Radiologist Assistant, and communication documented in the PACS or [employer]. Electronically Signed   By: Suzy Bouchard M.D.   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 Roxan Hockey. The patient has been on observation since his resection.  There was no role for adjuvant therapy for his condition.  He was seen by Dr. Kendall Flack at [facility] who recommended for him observation and consideration of treatment with sunitinib followed by surgery if the patient has disease recurrence He had repeat CT scan of the chest performed recently.  The scan showed stable disease except for a new 1.4 cm nodular component posteriorly. The patient is currently undergoing treatment with sunitinib 37.5 mg p.o. daily status post 3 months of treatment.  The patient has been tolerating his treatment with Sutent fairly well except for the hypertension. Unfortunately has a scan showed evidence for disease progression with interval development of multiple new pleural-based nodules in the right hemithorax measuring up to 2.9 cm concerning for recurrent/metastatic disease. The patient was seen  recently by Dr. Roxan Hockey who offered surgical resection by Dr. Kendall Flack recommended neoadjuvant treatment with pazopanib for 8 weeks followed by surgical resection if the patient has a stable or improvement of his disease followed by adjuvant treatment with Pazopanib for another 6-8 months. He was treated with pazopanib 800 mg p.o. daily.  He is status post 2 months of treatment. Repeat CT scan showed clear evidence for disease progression with enlarging right pleural-based masses. The patient was referred to Dr. 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  I sent the tissue block to [facility] for molecular studies and PD-L1 expression.  Unfortunately the molecular studies showed no actionable mutations and his PD-L1 expression was negative. The patient was seen recently by Dr. Angelina Ok at [facility] and he recommended for him treatment with inpatient systemic chemotherapy with doxorubicin, ifosfamide and mesna every 3 weeks.  He had 2D echo as well as Port-A-Cath placed and he will start the first dose of his treatment at [facility] on February 12, 2022.  I would be happy to  start his chemo with cycle #2 at the [facility] in around 3 weeks from now. The patient and his wife are in agreement with the current plan.  He will proceed with his treatment at [facility] this week as previously planned. I will arrange for him to be admitted to the hospital on March 03, 2022 for cycle #2. For the pain management the patient will continue with his current pain medication. I will see him back for follow-up visit the week before his admission to arrange for the admission and also check his blood work. He was advised to call immediately if he has any concerning symptoms  in the interval.  The patient voices understanding of current disease status and treatment options and is in agreement with the current care plan.  All questions were answered. The patient knows to call the clinic with any problems, questions or concerns. We can certainly see the patient much sooner if necessary.   Disclaimer: This note was dictated with voice recognition software. Similar sounding words can inadvertently be transcribed and may not be corrected upon review.

## 2022-02-11 ENCOUNTER — Other Ambulatory Visit: Payer: Self-pay

## 2022-02-12 ENCOUNTER — Other Ambulatory Visit: Payer: Self-pay

## 2022-02-12 DIAGNOSIS — R972 Elevated prostate specific antigen [PSA]: Secondary | ICD-10-CM

## 2022-02-13 ENCOUNTER — Other Ambulatory Visit: Payer: Self-pay

## 2022-02-13 ENCOUNTER — Telehealth: Payer: Self-pay | Admitting: Internal Medicine

## 2022-02-13 NOTE — Telephone Encounter (Signed)
Scheduled per 08/28 los, left a voicemail.

## 2022-02-17 ENCOUNTER — Encounter: Payer: Self-pay | Admitting: Internal Medicine

## 2022-02-19 ENCOUNTER — Inpatient Hospital Stay: Payer: BC Managed Care – PPO | Attending: Internal Medicine

## 2022-02-19 DIAGNOSIS — I10 Essential (primary) hypertension: Secondary | ICD-10-CM | POA: Insufficient documentation

## 2022-02-19 DIAGNOSIS — C3491 Malignant neoplasm of unspecified part of right bronchus or lung: Secondary | ICD-10-CM | POA: Insufficient documentation

## 2022-02-23 ENCOUNTER — Emergency Department (HOSPITAL_COMMUNITY): Payer: BC Managed Care – PPO

## 2022-02-23 ENCOUNTER — Observation Stay (HOSPITAL_COMMUNITY)
Admission: EM | Admit: 2022-02-23 | Discharge: 2022-02-25 | Disposition: A | Discharge status: Home / Self Care | Payer: BC Managed Care – PPO | Attending: Emergency Medicine | Admitting: Emergency Medicine

## 2022-02-23 ENCOUNTER — Other Ambulatory Visit: Payer: Self-pay

## 2022-02-23 ENCOUNTER — Encounter (HOSPITAL_COMMUNITY): Payer: Self-pay | Admitting: Emergency Medicine

## 2022-02-23 DIAGNOSIS — D649 Anemia, unspecified: Secondary | ICD-10-CM | POA: Insufficient documentation

## 2022-02-23 DIAGNOSIS — R509 Fever, unspecified: Principal | ICD-10-CM | POA: Insufficient documentation

## 2022-02-23 DIAGNOSIS — I10 Essential (primary) hypertension: Secondary | ICD-10-CM | POA: Diagnosis not present

## 2022-02-23 DIAGNOSIS — E162 Hypoglycemia, unspecified: Secondary | ICD-10-CM | POA: Diagnosis not present

## 2022-02-23 DIAGNOSIS — Z85831 Personal history of malignant neoplasm of soft tissue: Secondary | ICD-10-CM | POA: Diagnosis not present

## 2022-02-23 DIAGNOSIS — D696 Thrombocytopenia, unspecified: Secondary | ICD-10-CM | POA: Diagnosis not present

## 2022-02-23 DIAGNOSIS — Z79899 Other long term (current) drug therapy: Secondary | ICD-10-CM | POA: Diagnosis not present

## 2022-02-23 DIAGNOSIS — D492 Neoplasm of unspecified behavior of bone, soft tissue, and skin: Secondary | ICD-10-CM | POA: Diagnosis present

## 2022-02-23 DIAGNOSIS — E876 Hypokalemia: Secondary | ICD-10-CM | POA: Diagnosis not present

## 2022-02-23 DIAGNOSIS — Z20822 Contact with and (suspected) exposure to covid-19: Secondary | ICD-10-CM | POA: Diagnosis not present

## 2022-02-23 DIAGNOSIS — J45909 Unspecified asthma, uncomplicated: Secondary | ICD-10-CM | POA: Insufficient documentation

## 2022-02-23 DIAGNOSIS — R059 Cough, unspecified: Secondary | ICD-10-CM | POA: Diagnosis present

## 2022-02-23 HISTORY — DX: Secondary malignant neoplasm of other specified sites: C79.89

## 2022-02-23 LAB — SARS CORONAVIRUS 2 BY RT PCR: SARS Coronavirus 2 by RT PCR: NEGATIVE

## 2022-02-23 LAB — COMPREHENSIVE METABOLIC PANEL
ALT: 15 U/L (ref 0–44)
AST: 13 U/L — ABNORMAL LOW (ref 15–41)
Albumin: 3.1 g/dL — ABNORMAL LOW (ref 3.5–5.0)
Alkaline Phosphatase: 101 U/L (ref 38–126)
Anion gap: 4 — ABNORMAL LOW (ref 5–15)
BUN: 11 mg/dL (ref 8–23)
CO2: 30 mmol/L (ref 22–32)
Calcium: 8.8 mg/dL — ABNORMAL LOW (ref 8.9–10.3)
Chloride: 107 mmol/L (ref 98–111)
Creatinine, Ser: 0.75 mg/dL (ref 0.61–1.24)
GFR, Estimated: >60 mL/min (ref >60)
Glucose, Bld: 56 mg/dL — ABNORMAL LOW (ref 70–99)
Potassium: 2.5 mmol/L — CL (ref 3.5–5.1)
Sodium: 141 mmol/L (ref 135–145)
Total Bilirubin: 0.5 mg/dL (ref 0.3–1.2)
Total Protein: 6.7 g/dL (ref 6.5–8.1)

## 2022-02-23 LAB — LACTIC ACID, PLASMA: Lactic Acid, Venous: 1 mmol/L (ref 0.5–1.9)

## 2022-02-23 MED ORDER — PANTOPRAZOLE SODIUM 40 MG IV SOLR
40.0000 mg | Freq: Once | INTRAVENOUS | Status: AC
  Administered 2022-02-23: 40 mg via INTRAVENOUS
  Filled 2022-02-23: qty 10

## 2022-02-23 MED ORDER — ONDANSETRON HCL 4 MG/2ML IJ SOLN
4.0000 mg | Freq: Once | INTRAMUSCULAR | Status: AC
  Administered 2022-02-23: 4 mg via INTRAVENOUS
  Filled 2022-02-23: qty 2

## 2022-02-23 NOTE — ED Provider Notes (Signed)
[facility] DEPT Provider Note: Georgena Spurling, MD, FACEP  CSN: [id number] MRN: [id number] ARRIVAL: 02/23/22 at 2117 ROOM: [place]  Cough   HISTORY OF PRESENT ILLNESS  02/23/22 10:50 PMk Mathew [name] is a 61 y.o. 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  His last chemotherapy treatment was 02/16/2022 at [facility].  He is here with fever to 100.5 this afternoon along with a cough.  He is not had any worsening shortness of breath beyond his baseline.  He had some retching earlier but no frank vomiting.  He has had edema of the lower legs since receiving IV fluids at [facility] 02/16/2022.  Symptoms are not severe.  He contacted his oncologist and was advised to come to the ED for evaluation.  He was exposed to Zion (wife) about a week ago.   Past Medical History:  Diagnosis Date   Arthritis    osteo   Asthma    asthma as a child   Clubbing of nails    Deafness in left ear    GERD (gastroesophageal reflux disease)    History of kidney stones    Metastatic sarcoma (HCC)    Pneumonia    Shortness of breath dyspnea    just initially   Vitamin D deficiency     Past Surgical History:  Procedure Laterality Date   INTERCOSTAL NERVE BLOCK Right 04/27/2020   Procedure: INTERCOSTAL NERVE BLOCK;  Surgeon: [name], MD;  Location: [facility];  Service: Thoracic;  Laterality: Right;   KNEE CARTILAGE SURGERY Left 1976   pleural tumor Right 01/22/2016   resection of solitary fibrous tumor   RESECTION OF MEDIASTINAL MASS Right 01/23/2016   this is in error- no mediastinal tumor   RESECTION OF MEDIASTINAL MASS Right 11/13/2021   Procedure: RESECTION OF PLEURAL TUMORS;  Surgeon: [name], MD;  Location: [facility] OR;  Service: Thoracic;  Laterality: Right;   THORACOTOMY Right 04/27/2020   Procedure: THORACOTOMY;  Surgeon: [name], MD;  Location: [facility];  Service: Thoracic;  Laterality: Right;   THORACOTOMY Right 11/13/2021   Procedure: REDO  THORACOTOMY;  Surgeon: [name], MD;  Location: [facility];  Service: Thoracic;  Laterality: Right;   TONSILLECTOMY     VIDEO BRONCHOSCOPY WITH ENDOBRONCHIAL ULTRASOUND N/A 12/17/2015   Procedure: VIDEO BRONCHOSCOPY WITH ENDOBRONCHIAL ULTRASOUND;  Surgeon: [name], MD;  [facility];  Service: Thoracic;  Laterality: N/A;    Family History  Problem Relation Age of Onset   Diabetes type II Mother    Hypertension Father     Social History   Tobacco Use   Smoking status: Never   Smokeless tobacco: Former    Types: Chew    Quit date: 06/16/1980  Vaping Use   Vaping Use: Never used  Substance Use Topics   Alcohol use: Yes    Alcohol/week: 0.0 standard drinks of alcohol    Comment: 3- 5 drinks q month   Drug use: Not Currently    Prior to Admission medications   Medication Sig Start Date End Date Taking? Authorizing Provider  amLODipine (NORVASC) 5 MG tablet Take 10 mg by mouth daily.    [provider]  Cholecalciferol (VITAMIN D3) 50 MCG (2000 UT) capsule Take 1 capsule (2,000 Units total) by mouth daily. 04/30/20   Roddenberry, Arlis Porta, PA-C  FEROCON capsule TAKE 1 CAPSULE BY MOUTH 2 (TWO) TIMES DAILY AFTER A MEAL. 01/12/22   [name], MD  Allergies Aspirin and Penicillins   REVIEW OF SYSTEMS  Negative except as noted here or in the History of Present Illness.   PHYSICAL EXAMINATION  Initial Vital Signs Blood pressure (!) 159/83, pulse 89, temperature 98.8 F (37.1 C), temperature source Oral, resp. rate (!) 26, height 5\' 9"  (1.753 m), weight 79.8 kg, SpO2 94 %.  Examination General: Well-developed, well-nourished male in no acute distress; appearance consistent with age of record HENT: normocephalic; atraumatic Eyes: pupils equal, round and reactive to light; extraocular muscles intact Neck: supple Heart: regular rate and rhythm Lungs: Decreased air movement in right base with prominent bruit Chest: Port-A-Cath right upper  chest Abdomen: soft; nondistended; nontender; bowel sounds present Extremities: No deformity; full range of motion; pitting edema of lower legs bilaterally Neurologic: Awake, alert and oriented; motor function intact in all extremities and symmetric; no facial droop Skin: Warm and dry Psychiatric: Normal mood and affect   RESULTS  Summary of this visit's results, reviewed and interpreted by myself:   EKG Interpretation  Date/Time:  Sunday February 23 2022 22:15:44 EDT Ventricular Rate:  91 PR Interval:  179 QRS Duration: 89 QT Interval:  391 QTC Calculation: 482 R Axis:   -3 Text Interpretation: Sinus rhythm Biatrial enlargement Probable left ventricular hypertrophy Abnormal T, consider ischemia, diffuse leads Confirmed by Shanon Rosser [phone]) on 02/23/2022 10:33:22 PM       Laboratory Studies: Results for orders placed or performed during the hospital encounter of 02/23/22 (from the past 24 hour(s))  Lactic acid, plasma     Status: None   Collection Time: 02/23/22 10:11 PM  Result Value Ref Range   Lactic Acid, Venous 1.0 0.5 - 1.9 mmol/L  Comprehensive metabolic panel     Status: Abnormal   Collection Time: 02/23/22 10:11 PM  Result Value Ref Range   Sodium 141 135 - 145 mmol/L   Potassium 2.5 (LL) 3.5 - 5.1 mmol/L   Chloride 107 98 - 111 mmol/L   CO2 30 22 - 32 mmol/L   Glucose, Bld 56 (L) 70 - 99 mg/dL   BUN 11 8 - 23 mg/dL   Creatinine, Ser 0.75 0.61 - 1.24 mg/dL   Calcium 8.8 (L) 8.9 - 10.3 mg/dL   Total Protein 6.7 6.5 - 8.1 g/dL   Albumin 3.1 (L) 3.5 - 5.0 g/dL   AST 13 (L) 15 - 41 U/L   ALT 15 0 - 44 U/L   Alkaline Phosphatase 101 38 - 126 U/L   Total Bilirubin 0.5 0.3 - 1.2 mg/dL   GFR, Estimated >60 >60 mL/min   Anion gap 4 (L) 5 - 15  SARS Coronavirus 2 by RT PCR (hospital order, performed in [facility] hospital lab) *cepheid single result test* Anterior Nasal Swab     Status: None   Collection Time: 02/23/22 10:11 PM   Specimen: Anterior Nasal Swab   Result Value Ref Range   SARS Coronavirus 2 by RT PCR NEGATIVE NEGATIVE   Imaging Studies: DG Chest 2 View  Result Date: 02/23/2022 CLINICAL DATA:  Cough, history of cancer. EXAM: CHEST - 2 VIEW COMPARISON:  Chest x-ray 01/09/2022.  Chest CT 01/17/2022. FINDINGS: Right chest port catheter tip projects over the SVC. Surgical clips are seen in the right lung and along the right chest wall. Small right pleural effusion is stable. Pleural-based nodular density in the right upper lobe and right lower lobe densities appear unchanged. The left lung is clear. Cardiac silhouette within normal limits. Evidence for pneumothorax or acute fracture.  IMPRESSION: 1. Stable appearance of chest with small right pleural effusion and right lung dense opacities. 2. No new focal lung infiltrate. Electronically Signed   By: Ronney Asters M.D.   On: 02/23/2022 22:41    ED COURSE and MDM  Nursing notes, initial and subsequent vitals signs, including pulse oximetry, reviewed and interpreted by myself.  Vitals:   02/23/22 2129 02/23/22 2130 02/23/22 2215 02/23/22 2348  BP: (!) 162/79 (!) 162/79 (!) 159/83 (!) 153/82  Pulse: 91 83 89 91  Resp: 16 16 (!) 26 (!) 21  Temp: 98.8 F (37.1 C)     TempSrc: Oral     SpO2: 93% 90% 94% 94%  Weight: 79.8 kg     Height: 5\' 9"  (1.753 m)      Medications  ondansetron (ZOFRAN) injection 4 mg (4 mg Intravenous Given 02/23/22 2345)  pantoprazole (PROTONIX) injection 40 mg (40 mg Intravenous Given 02/23/22 2344)   12:01 AM D50 and IV potassium runs ordered for hyperglycemia and hypokalemia, respectively.   PROCEDURES  Procedures   ED DIAGNOSES  No diagnosis found. '

## 2022-02-23 NOTE — ED Provider Triage Note (Signed)
Emergency Medicine Provider Triage Evaluation Note  Mathew [name] , a 61 y.o. male  was evaluated in triage.  Patient has a history of metastatic malignant solitary fibrous neoplasm followed at [facility], most recent chemotherapy on 02/14/2022.  Pt complains of flulike illness and cough with temperature of 100.5 F today.  He consulted with his doctor who recommended that he go to the nearest emergency department for evaluation.  Patient reports body aches and cough.  He has had some vomiting today.  Reports that his wife recently had COVID.  Patient has taken 3 COVID tests that have been negative.  Review of Systems  Positive: Cough, body aches, fever Negative: Rash  Physical Exam  BP (!) 162/79 (BP Location: Right Arm)   Pulse 91   Temp 98.8 F (37.1 C) (Oral)   Resp 16   Ht 5\' 9"  (1.753 m)   Wt 79.8 kg   SpO2 93%   BMI 25.99 kg/m  Gen:   Awake, no distress   Resp:  Normal effort  MSK:   Moves extremities without difficulty  Other:    Medical Decision Making  Medically screening exam initiated at 9:38 PM.  Appropriate orders placed.  [name] was informed that the remainder of the evaluation will be completed by another provider, this initial triage assessment does not replace that evaluation, and the importance of remaining in the ED until their evaluation is complete.     Carlisle Cater, PA-C 02/23/22 2140

## 2022-02-23 NOTE — ED Triage Notes (Signed)
Patient c/o cough, fever, and vomiting since yesterday. Wife Covid positive last week. Patient hx sarcoma. Last chemo last week.

## 2022-02-24 ENCOUNTER — Encounter (HOSPITAL_COMMUNITY): Payer: Self-pay | Admitting: Family Medicine

## 2022-02-24 DIAGNOSIS — E876 Hypokalemia: Secondary | ICD-10-CM

## 2022-02-24 DIAGNOSIS — D492 Neoplasm of unspecified behavior of bone, soft tissue, and skin: Secondary | ICD-10-CM

## 2022-02-24 DIAGNOSIS — R509 Fever, unspecified: Secondary | ICD-10-CM

## 2022-02-24 DIAGNOSIS — D649 Anemia, unspecified: Secondary | ICD-10-CM

## 2022-02-24 DIAGNOSIS — D696 Thrombocytopenia, unspecified: Secondary | ICD-10-CM

## 2022-02-24 DIAGNOSIS — E162 Hypoglycemia, unspecified: Secondary | ICD-10-CM | POA: Diagnosis not present

## 2022-02-24 DIAGNOSIS — I10 Essential (primary) hypertension: Secondary | ICD-10-CM | POA: Diagnosis not present

## 2022-02-24 LAB — URINALYSIS, ROUTINE W REFLEX MICROSCOPIC
Bilirubin Urine: NEGATIVE
Glucose, UA: NEGATIVE mg/dL
Ketones, ur: NEGATIVE mg/dL
Leukocytes,Ua: NEGATIVE
Nitrite: NEGATIVE
Protein, ur: 100 mg/dL — AB
Specific Gravity, Urine: 1.017 (ref 1.005–1.030)
pH: 6 (ref 5.0–8.0)

## 2022-02-24 LAB — CBC WITH DIFFERENTIAL/PLATELET
Abs Immature Granulocytes: 0.3 10*3/uL — ABNORMAL HIGH (ref 0.00–0.07)
Band Neutrophils: 7 %
Basophils Absolute: 0 10*3/uL (ref 0.0–0.1)
Basophils Relative: 0 %
Eosinophils Absolute: 0.1 10*3/uL (ref 0.0–0.5)
Eosinophils Relative: 1 %
HCT: 30.6 % — ABNORMAL LOW (ref 39.0–52.0)
Hemoglobin: 9.4 g/dL — ABNORMAL LOW (ref 13.0–17.0)
Lymphocytes Relative: 16 %
Lymphs Abs: 1.2 10*3/uL (ref 0.7–4.0)
MCH: 26.6 pg (ref 26.0–34.0)
MCHC: 30.7 g/dL (ref 30.0–36.0)
MCV: 86.7 fL (ref 80.0–100.0)
Metamyelocytes Relative: 2 %
Monocytes Absolute: 1.2 10*3/uL — ABNORMAL HIGH (ref 0.1–1.0)
Monocytes Relative: 16 %
Myelocytes: 2 %
Neutro Abs: 4.7 10*3/uL (ref 1.7–7.7)
Neutrophils Relative %: 56 %
Platelets: 66 10*3/uL — ABNORMAL LOW (ref 150–400)
RBC: 3.53 MIL/uL — ABNORMAL LOW (ref 4.22–5.81)
RDW: 18.4 % — ABNORMAL HIGH (ref 11.5–15.5)
WBC: 7.4 10*3/uL (ref 4.0–10.5)
nRBC: 3 % — ABNORMAL HIGH (ref 0.0–0.2)
nRBC: 5 /100 WBC — ABNORMAL HIGH

## 2022-02-24 LAB — BASIC METABOLIC PANEL
Anion gap: 5 (ref 5–15)
BUN: 10 mg/dL (ref 8–23)
CO2: 31 mmol/L (ref 22–32)
Calcium: 8.4 mg/dL — ABNORMAL LOW (ref 8.9–10.3)
Chloride: 104 mmol/L (ref 98–111)
Creatinine, Ser: 0.73 mg/dL (ref 0.61–1.24)
GFR, Estimated: >60 mL/min (ref >60)
Glucose, Bld: 94 mg/dL (ref 70–99)
Potassium: 3 mmol/L — ABNORMAL LOW (ref 3.5–5.1)
Sodium: 140 mmol/L (ref 135–145)

## 2022-02-24 LAB — RESPIRATORY PANEL BY PCR

## 2022-02-24 LAB — CBC
HCT: 27.5 % — ABNORMAL LOW (ref 39.0–52.0)
Hemoglobin: 8.3 g/dL — ABNORMAL LOW (ref 13.0–17.0)
MCH: 26.4 pg (ref 26.0–34.0)
MCHC: 30.2 g/dL (ref 30.0–36.0)
MCV: 87.6 fL (ref 80.0–100.0)
Platelets: 60 10*3/uL — ABNORMAL LOW (ref 150–400)
RBC: 3.14 MIL/uL — ABNORMAL LOW (ref 4.22–5.81)
RDW: 18.4 % — ABNORMAL HIGH (ref 11.5–15.5)
WBC: 7.3 10*3/uL (ref 4.0–10.5)
nRBC: 2.5 % — ABNORMAL HIGH (ref 0.0–0.2)

## 2022-02-24 LAB — GLUCOSE, CAPILLARY
Glucose-Capillary: 105 mg/dL — ABNORMAL HIGH (ref 70–99)
Glucose-Capillary: 105 mg/dL — ABNORMAL HIGH (ref 70–99)
Glucose-Capillary: 113 mg/dL — ABNORMAL HIGH (ref 70–99)
Glucose-Capillary: 137 mg/dL — ABNORMAL HIGH (ref 70–99)
Glucose-Capillary: 242 mg/dL — ABNORMAL HIGH (ref 70–99)
Glucose-Capillary: 96 mg/dL (ref 70–99)

## 2022-02-24 LAB — CBG MONITORING, ED: Glucose-Capillary: 194 mg/dL — ABNORMAL HIGH (ref 70–99)

## 2022-02-24 LAB — MAGNESIUM: Magnesium: 1.5 mg/dL — ABNORMAL LOW (ref 1.7–2.4)

## 2022-02-24 MED ORDER — VITAMIN D 25 MCG (1000 UNIT) PO TABS
5000.0000 [IU] | ORAL_TABLET | Freq: Every day | ORAL | Status: DC
  Administered 2022-02-24 – 2022-02-25 (×2): 5000 [IU] via ORAL
  Filled 2022-02-24 (×2): qty 5

## 2022-02-24 MED ORDER — SENNOSIDES-DOCUSATE SODIUM 8.6-50 MG PO TABS
1.0000 | ORAL_TABLET | Freq: Two times a day (BID) | ORAL | Status: DC
  Administered 2022-02-24 – 2022-02-25 (×3): 1 via ORAL
  Filled 2022-02-24 (×3): qty 1

## 2022-02-24 MED ORDER — POTASSIUM CHLORIDE 20 MEQ PO PACK
40.0000 meq | PACK | Freq: Once | ORAL | Status: AC
  Administered 2022-02-24: 40 meq via ORAL
  Filled 2022-02-24: qty 2

## 2022-02-24 MED ORDER — SODIUM CHLORIDE 0.9% FLUSH
10.0000 mL | INTRAVENOUS | Status: DC | PRN
  Administered 2022-02-25: 10 mL

## 2022-02-24 MED ORDER — CHLORHEXIDINE GLUCONATE CLOTH 2 % EX PADS
6.0000 | MEDICATED_PAD | Freq: Every day | CUTANEOUS | Status: DC
Start: 2022-02-24 — End: 2022-02-25

## 2022-02-24 MED ORDER — VITAMIN C 500 MG PO TABS
500.0000 mg | ORAL_TABLET | Freq: Every day | ORAL | Status: DC
  Administered 2022-02-24 – 2022-02-25 (×2): 500 mg via ORAL
  Filled 2022-02-24 (×2): qty 1

## 2022-02-24 MED ORDER — DEXTROSE 10 % IV SOLN: INTRAVENOUS | Status: DC

## 2022-02-24 MED ORDER — MAGNESIUM SULFATE 2 GM/50ML IV SOLN
2.0000 g | Freq: Once | INTRAVENOUS | Status: AC
  Administered 2022-02-24: 2 g via INTRAVENOUS
  Filled 2022-02-24: qty 50

## 2022-02-24 MED ORDER — FE FUMARATE-B12-VIT C-FA-IFC PO CAPS
1.0000 | ORAL_CAPSULE | Freq: Two times a day (BID) | ORAL | Status: DC
  Administered 2022-02-24 – 2022-02-25 (×3): 1 via ORAL
  Filled 2022-02-24 (×3): qty 1

## 2022-02-24 MED ORDER — DEXTROSE 50 % IV SOLN
1.0000 | Freq: Once | INTRAVENOUS | Status: AC
  Administered 2022-02-24: 50 mL via INTRAVENOUS
  Filled 2022-02-24: qty 50

## 2022-02-24 MED ORDER — ENOXAPARIN SODIUM 40 MG/0.4ML IJ SOSY
40.0000 mg | PREFILLED_SYRINGE | INTRAMUSCULAR | Status: DC
  Administered 2022-02-24 – 2022-02-25 (×2): 40 mg via SUBCUTANEOUS
  Filled 2022-02-24 (×2): qty 0.4

## 2022-02-24 MED ORDER — AMLODIPINE BESYLATE 10 MG PO TABS
10.0000 mg | ORAL_TABLET | Freq: Every day | ORAL | Status: DC
  Administered 2022-02-24 – 2022-02-25 (×2): 10 mg via ORAL
  Filled 2022-02-24 (×2): qty 1

## 2022-02-24 MED ORDER — DEXTROSE 10 % IV SOLN: INTRAVENOUS | Status: AC

## 2022-02-24 MED ORDER — POTASSIUM CHLORIDE 10 MEQ/100ML IV SOLN
10.0000 meq | INTRAVENOUS | Status: AC
  Administered 2022-02-24 (×6): 10 meq via INTRAVENOUS
  Filled 2022-02-24 (×6): qty 100

## 2022-02-24 MED ORDER — POLYETHYLENE GLYCOL 3350 17 G PO PACK
17.0000 g | PACK | Freq: Every day | ORAL | Status: DC
  Administered 2022-02-24 – 2022-02-25 (×2): 17 g via ORAL
  Filled 2022-02-24 (×2): qty 1

## 2022-02-24 NOTE — Care Plan (Signed)
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  He reported his wife had COVID last week.  His last chemotherapy was on 02/12/2022.  Chest x-ray shows stable appearance of the chest with the right small pleural effusion and right lung dense opacity.  Patient is admitted for fever likely due to viral illness.  Chest x-ray negative for any infiltrate.  Patient was seen and examined at bedside.  He reports feeling much improved after getting IV hydration.

## 2022-02-24 NOTE — Plan of Care (Signed)

## 2022-02-24 NOTE — Progress Notes (Signed)
Patient is transferred from ED to the floor at 0145. Alert and oriented x 4. No pain complained at this time. Vital signs was taken, room is set up and call light is within patient's reach. Will monitor patient as protocol.

## 2022-02-24 NOTE — Assessment & Plan Note (Signed)
Stable near baseline.  Continue iron supplementation.

## 2022-02-24 NOTE — Assessment & Plan Note (Signed)
Reports fever of 100.5 F at home but is afebrile here without leukocytosis.No neutropenia.  Wife had COVID last week but COVID PCR here negative.  Chest x-ray negative for any infiltrate. Well appearing on exam.  -Suspect likely viral infection.  Obtain RVP and will also do sputum culture -Blood culture pending

## 2022-02-24 NOTE — Assessment & Plan Note (Signed)
Continue home antihypertensives pending med rec

## 2022-02-24 NOTE — Assessment & Plan Note (Signed)
-  repleted with IV potassium 10 meq x6 in ED. Also will check Mg and replete as needed.

## 2022-02-24 NOTE — Assessment & Plan Note (Addendum)
CBG of 56 with improvement following amp of D50.  -no hx of diabetes and reports issues with hypoglycemia due to his sarcoma in the past. Also has low appetite. -Start dextrose infusion overnight.  Follow CBG q4hr.

## 2022-02-24 NOTE — Assessment & Plan Note (Signed)
Diagnosis initially on 11/2015 s/p resection.  Had recurrence and underwent resection again 03/2020.  Had redo right thoracotomy with resection of pleural tumors and lymph node dissection in 10/2021.  Currently on systemic chemotherapy with last dose on February 12, 2022 at [facility]. -Currently follows with oncology Dr. Earlie Server with next planned cycle on 03/03/2022

## 2022-02-24 NOTE — H&P (Signed)
History and Physical    Patient: Mathew [name] NKN:[id number] DOB: 10/05/1960 DOA: 02/23/2022 DOS: the patient was seen and examined on 02/24/2022 PCP: [name], MD  Patient coming from: Home  Chief Complaint:  Chief Complaint  Patient presents with   Cough   HPI: Mathew [name] is a 61 y.o. male with medical history significant of recurrent solitary fibrous tumor of the right lung s/p resections on systemic chemotherapy at [facility], GERD, [place] who presents with fever.   Had fever of 100.72F with symptoms of worsening of his chronic cough. Cough is productive. Runny nose. Denies any nausea, vomiting or diarrhea. No abdominal pain. Has low appetite. Wife had COVID last week. Last chemotherapy on 02/12/2022.  In the ED, he was afebrile with mildly elevated BP of 160 over 70s on room air.  No leukocytosis.  Hemoglobin 9.4.  Platelet of 56.  CBG of potassium of 2.5, normal creatinine.  COVID PCR negative.  Chest x-ray shows stable appearance of chest with small right pleural effusion and right lung dense opacity.  No new focal infiltrate.  He was given IV potassium 10 meq x6, PPI, Zofran and amp of D50 in the ED.  Hospitalist then consulted for admission. Review of Systems: As mentioned in the history of present illness. All other systems reviewed and are negative. Past Medical History:  Diagnosis Date   Arthritis    osteo   Asthma    asthma as a child   Clubbing of nails    Deafness in left ear    GERD (gastroesophageal reflux disease)    History of kidney stones    Metastatic sarcoma (HCC)    Pneumonia    Shortness of breath dyspnea    just initially   Vitamin D deficiency    Past Surgical History:  Procedure Laterality Date   INTERCOSTAL NERVE BLOCK Right 04/27/2020   Procedure: INTERCOSTAL NERVE BLOCK;  Surgeon: [name], MD;  Location: [facility];  Service: Thoracic;  Laterality: Right;   KNEE CARTILAGE SURGERY Left 1976   pleural tumor Right 01/22/2016    resection of solitary fibrous tumor   RESECTION OF MEDIASTINAL MASS Right 01/23/2016   this is in error- no mediastinal tumor   RESECTION OF MEDIASTINAL MASS Right 11/13/2021   Procedure: RESECTION OF PLEURAL TUMORS;  Surgeon: [name], MD;  Location: [facility] OR;  Service: Thoracic;  Laterality: Right;   THORACOTOMY Right 04/27/2020   Procedure: THORACOTOMY;  Surgeon: [name], MD;  Location: [place];  Service: Thoracic;  Laterality: Right;   THORACOTOMY Right 11/13/2021   Procedure: REDO THORACOTOMY;  Surgeon: [name], MD;  Location: [place];  Service: Thoracic;  Laterality: Right;   TONSILLECTOMY     VIDEO BRONCHOSCOPY WITH ENDOBRONCHIAL ULTRASOUND N/A 12/17/2015   Procedure: VIDEO BRONCHOSCOPY WITH ENDOBRONCHIAL ULTRASOUND;  Surgeon: [name], MD;  Location: [place];  Service: Thoracic;  Laterality: N/A;   Social History:  reports that he has never smoked. He quit smokeless tobacco use about 41 years ago.  His smokeless tobacco use included chew. He reports current alcohol use. He reports that he does not currently use drugs.  Allergies  Allergen Reactions   Aspirin Shortness Of Breath   Penicillins Rash    Has patient had a PCN reaction causing immediate rash, facial/tongue/throat swelling, SOB or lightheadedness with hypotension:YES Has patient had a PCN reaction causing severe rash involving mucus membranes or skin necrosis: NO Has patient had a PCN reaction that required  hospitalization NO Has patient had a PCN reaction occurring within the last 10 years: NO If all of the above answers are "NO", then may proceed with Cephalosporin use.    Family History  Problem Relation Age of Onset   Diabetes type II Mother    Hypertension Father     Prior to Admission medications   Medication Sig Start Date End Date Taking? Authorizing Provider  amLODipine (NORVASC) 5 MG tablet Take 10 mg by mouth daily.   Yes [provider]  ascorbic acid  (VITAMIN C) 500 MG tablet Take 500 mg by mouth daily.   Yes [provider]  Cholecalciferol (VITAMIN D-3) 125 MCG (5000 UT) TABS Take 1 tablet by mouth daily.   Yes [provider]  dexamethasone (DECADRON) 4 MG tablet Take 8 mg by mouth as directed. TAKE 2 TABLETS BY MOUTH IN THE MORNING FOR 2 DAYS FOLLOWING CHEMOTHERAPY 02/04/22  Yes [provider]  FEROCON capsule TAKE 1 CAPSULE BY MOUTH 2 (TWO) TIMES DAILY AFTER A MEAL. Patient taking differently: Take 1 capsule by mouth 2 (two) times daily after a meal. 01/12/22  Yes [name], MD  Multiple Vitamins-Minerals (ZINC PO) Take 1 tablet by mouth daily.   Yes [provider]  OLANZapine (ZYPREXA) 5 MG tablet Take 5 mg by mouth as directed. TAKE 1 (5 MG) TABLET BY MOUTH AT NIGHT FOR 2 NIGHTS FOLLOWING CHEMOTHERAPY 02/04/22  Yes [provider]  prochlorperazine (COMPAZINE) 10 MG tablet Take 10 mg by mouth every 6 (six) hours as needed for vomiting or nausea. 02/04/22  Yes [provider]  Cholecalciferol (VITAMIN D3) 50 MCG (2000 UT) capsule Take 1 capsule (2,000 Units total) by mouth daily. Patient not taking: Reported on 02/24/2022 04/30/20   Antony Odea, PA-C    Physical Exam: Vitals:   02/24/22 0000 02/24/22 0015 02/24/22 0030 02/24/22 0100  BP: (!) 151/80 (!) 150/79 (!) 146/77 129/73  Pulse: 91 89 89 99  Resp:  (!) 21    Temp:    98.8 F (37.1 C)  TempSrc:    Oral  SpO2: (!) 76% 96% 98% 98%  Weight:      Height:       Constitutional: NAD, calm, comfortable, nontoxic well-appearing male appearing younger than stated age laying in bed Eyes: lids and conjunctivae normal ENMT: Mucous membranes are moist.  Neck: normal, supple Respiratory: Diminished right lower lobe lung sounds.  No wheezing or crackles.  Had more labored respirations when speaking.  No accessory muscle use.  Cardiovascular: Regular rate and rhythm, no murmurs / rubs / gallops.  Nonpitting edema  bilateral distal lower extremity.  Right Port-A-Cath in place. Abdomen: Soft, nondistended and nontender.  Bowel sounds positive.  Musculoskeletal: no clubbing / cyanosis. No joint deformity upper and lower extremities.  Normal muscle tone.  Skin: no rashes, lesions, ulcers. No induration Neurologic: CN 2-12 grossly intact. Strength 5/5 in all 4.  Psychiatric: Normal judgment and insight. Alert and oriented x 3. Normal mood. Data Reviewed:  See HPI  Assessment and Plan: * Fever Reports fever of 100.5 F at home but is afebrile here without leukocytosis.No neutropenia.  Wife had COVID last week but COVID PCR here negative.  Chest x-ray negative for any infiltrate. Well appearing on exam.  -Suspect likely viral infection.  Obtain RVP and will also do sputum culture -Blood culture pending  Anemia Stable near baseline.  Continue iron supplementation.  Thrombocytopenia (HCC) plt of 66. Likely reactive to infection.  Hypoglycemia CBG of 56 with improvement following amp of D50.  -no hx of diabetes and reports issues with hypoglycemia due to his sarcoma in the past. Also has low appetite. -Start dextrose infusion overnight.  Follow CBG q4hr.  Hypokalemia -repleted with IV potassium 10 meq x6 in ED. Also will check Mg and replete as needed.  Hypertension Continue home antihypertensives pending med rec  Solitary fibrous tumor Diagnosis initially on 11/2015 s/p resection.  Had recurrence and underwent resection again 03/2020.  Had redo right thoracotomy with resection of pleural tumors and lymph node dissection in 10/2021.  Currently on systemic chemotherapy with last dose on February 12, 2022 at [facility]. -Currently follows with oncology Dr. Earlie Server with next planned cycle on 03/03/2022      Advance Care Planning:   Code Status: Full Code   Consults: none  Family Communication: none at bedside  Severity of Illness: The appropriate patient status for this patient is  OBSERVATION. Observation status is judged to be reasonable and necessary in order to provide the required intensity of service to ensure the patient's safety. The patient's presenting symptoms, physical exam findings, and initial radiographic and laboratory data in the context of their medical condition is felt to place them at decreased risk for further clinical deterioration. Furthermore, it is anticipated that the patient will be medically stable for discharge from the hospital within 2 midnights of admission.   Author: Orene Desanctis, DO 02/24/2022 1:32 AM  For on call review [email or web address].

## 2022-02-24 NOTE — Assessment & Plan Note (Signed)
plt of 66. Likely reactive to infection.

## 2022-02-25 ENCOUNTER — Telehealth: Payer: Self-pay

## 2022-02-25 DIAGNOSIS — R509 Fever, unspecified: Secondary | ICD-10-CM | POA: Diagnosis not present

## 2022-02-25 LAB — GLUCOSE, CAPILLARY
Glucose-Capillary: 82 mg/dL (ref 70–99)
Glucose-Capillary: 66 mg/dL — ABNORMAL LOW (ref 70–99)
Glucose-Capillary: 91 mg/dL (ref 70–99)
Glucose-Capillary: 88 mg/dL (ref 70–99)

## 2022-02-25 LAB — CBC
HCT: 27 % — ABNORMAL LOW (ref 39.0–52.0)
Hemoglobin: 8.4 g/dL — ABNORMAL LOW (ref 13.0–17.0)
MCH: 27.4 pg (ref 26.0–34.0)
MCHC: 31.1 g/dL (ref 30.0–36.0)
MCV: 87.9 fL (ref 80.0–100.0)
Platelets: 56 10*3/uL — ABNORMAL LOW (ref 150–400)
RBC: 3.07 MIL/uL — ABNORMAL LOW (ref 4.22–5.81)
RDW: 18.5 % — ABNORMAL HIGH (ref 11.5–15.5)
WBC: 11.8 10*3/uL — ABNORMAL HIGH (ref 4.0–10.5)
nRBC: 2 % — ABNORMAL HIGH (ref 0.0–0.2)

## 2022-02-25 LAB — PHOSPHORUS: Phosphorus: 2.2 mg/dL — ABNORMAL LOW (ref 2.5–4.6)

## 2022-02-25 LAB — MAGNESIUM: Magnesium: 1.6 mg/dL — ABNORMAL LOW (ref 1.7–2.4)

## 2022-02-25 MED ORDER — POTASSIUM PHOSPHATES 15 MMOLE/5ML IV SOLN
30.0000 mmol | Freq: Once | INTRAVENOUS | Status: DC
  Filled 2022-02-25: qty 10

## 2022-02-25 MED ORDER — MAGNESIUM SULFATE 2 GM/50ML IV SOLN
2.0000 g | Freq: Once | INTRAVENOUS | Status: AC
  Administered 2022-02-25: 2 g via INTRAVENOUS
  Filled 2022-02-25: qty 50

## 2022-02-25 MED ORDER — HEPARIN SOD (PORK) LOCK FLUSH 100 UNIT/ML IV SOLN
500.0000 [IU] | INTRAVENOUS | Status: AC | PRN
  Administered 2022-02-25: 500 [IU]

## 2022-02-25 NOTE — Discharge Instructions (Signed)
Advised to follow-up with primary care physician in 1 week. Advised to follow-up with oncology at [facility] and Dr. Julien Nordmann is a scheduled.

## 2022-02-25 NOTE — Plan of Care (Signed)

## 2022-02-25 NOTE — TOC Transition Note (Signed)
Transition of Care (TOC) - CM/SW Discharge Note   Patient Details  Name: Mathew [name] MRN: [id number] Date of Birth: 08/18/1960  Transition of Care (TOC) CM/SW Contact:  Meredith A Rumbley, LCSW Phone Number: 02/25/2022, 10:36 AM   Clinical Narrative:    Met with pt at RN request for resources. 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 Pt is agreeable to having resources for food and other financial assistance added to his discharge paperwork. Resources have been added to pt's chart to be given at discharge. No further needs identified. Please consult TOC should further needs arise.    Final next level of care: Home/Self Care Barriers to Discharge: No Barriers Identified   Patient Goals and CMS Choice Patient states their goals for this hospitalization and ongoing recovery are:: To return home   Choice offered to / list presented to : Patient  Discharge Placement                       Discharge Plan and Services In-house Referral: NA Discharge Planning Services: NA Post Acute Care Choice: NA          DME Arranged: N/A DME Agency: NA                  Social Determinants of Health (SDOH) Interventions Food Insecurity Interventions: Patient Refused Housing Interventions: Intervention Not Indicated Transportation Interventions: Intervention Not Indicated Utilities Interventions: Intervention Not Indicated   Readmission Risk Interventions     No data to display             

## 2022-02-25 NOTE — Discharge Summary (Signed)
Physician Discharge Summary  Mathew [name] LFY:[id number] DOB: Nov 28, 1960 DOA: 02/23/2022  PCP: [name], MD  Admit date: 02/23/2022  Discharge date: 02/25/2022  Admitted From: Home.  Disposition:  Home.  Recommendations for Outpatient Follow-up:  Follow up with PCP in 1-2 weeks. Please obtain BMP/CBC in one week. Advised to follow-up with oncology at [facility] and Dr. Julien Nordmann is a scheduled.  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  He reported his wife had COVID last week.  His last chemotherapy was on 02/12/2022.  Chest x-ray shows stable appearance of the chest with the right small pleural effusion and right lung dense opacity.  Patient was admitted for fever likely due to viral illness. Chest x-ray negative for any infiltrate.  Patient was given a dose of antibiotics.  Continued on IV hydration.  Electrolytes were replaced.  Patient feels significantly improved remains afebrile.  Has been tolerating diet without any nausea vomiting or other symptoms.  He wants to be discharged.  Patient has appointment with oncology Dr. Julien Nordmann tomorrow and in [facility] for subsequent chemo session.  It was likely viral illness.  Patient is being discharged home.  Discharge Diagnoses:  Principal Problem:   Fever Active Problems:   Solitary fibrous tumor   Hypertension   Hypokalemia   Hypoglycemia   Thrombocytopenia (HCC)   Anemia    Discharge Instructions  Discharge Instructions     Call MD for:  difficulty breathing, headache or visual disturbances   Complete by: As directed    Call MD for:  persistant dizziness or light-headedness   Complete by: As  directed    Call MD for:  persistant nausea and vomiting   Complete by: As directed    Diet - low sodium heart healthy   Complete by: As directed    Diet Carb Modified   Complete by: As directed    Discharge instructions   Complete by: As directed    Advised to follow-up with primary care physician in 1 week. Advised to follow-up with oncology at [facility] and Dr. Julien Nordmann is a scheduled.   Increase activity slowly   Complete by: As directed       Allergies as of 02/25/2022       Reactions   Aspirin Shortness Of Breath   Penicillins Rash   Has patient had a PCN reaction causing immediate rash, facial/tongue/throat swelling, SOB or lightheadedness with hypotension:YES Has patient had a PCN reaction causing severe rash involving mucus membranes or skin necrosis: NO Has patient had a PCN reaction that required hospitalization NO Has patient had a PCN reaction occurring within the last 10 years: NO If all of the above answers are "NO", then may proceed with Cephalosporin use.        Medication List     TAKE these medications    amLODipine 5 MG tablet Commonly known as: NORVASC Take 10 mg by mouth daily.   ascorbic acid 500 MG tablet Commonly known as: VITAMIN C Take 500 mg by mouth daily.   dexamethasone 4 MG tablet Commonly known as: DECADRON Take 8 mg by mouth as directed. TAKE 2 TABLETS BY MOUTH IN THE MORNING FOR 2 DAYS FOLLOWING CHEMOTHERAPY   Ferocon capsule Generic drug: ferrous  [id number] C-folic acid TAKE 1 CAPSULE BY MOUTH 2 (TWO) TIMES DAILY AFTER A MEAL.   OLANZapine 5 MG tablet Commonly known as: ZYPREXA Take 5 mg by mouth as directed. TAKE 1 (5 MG) TABLET BY MOUTH AT NIGHT FOR 2 NIGHTS FOLLOWING CHEMOTHERAPY   prochlorperazine 10 MG tablet Commonly known as: COMPAZINE Take 10 mg by mouth every 6 (six) hours as needed for vomiting or nausea.   Vitamin D-3 125 MCG (5000 UT) Tabs Take 1 tablet by mouth daily.   Vitamin D3 50 MCG (2000 UT)  capsule Take 1 capsule (2,000 Units total) by mouth daily.   ZINC PO Take 1 tablet by mouth daily.        Follow-up Information     [name], MD Follow up in 1 week(s).   Specialty: Internal Medicine Contact information: [place] [address] [phone]                Allergies  Allergen Reactions   Aspirin Shortness Of Breath   Penicillins Rash    Has patient had a PCN reaction causing immediate rash, facial/tongue/throat swelling, SOB or lightheadedness with hypotension:YES Has patient had a PCN reaction causing severe rash involving mucus membranes or skin necrosis: NO Has patient had a PCN reaction that required hospitalization NO Has patient had a PCN reaction occurring within the last 10 years: NO If all of the above answers are "NO", then may proceed with Cephalosporin use.    Consultations: None   Procedures/Studies: DG Chest 2 View  Result Date: 02/23/2022 CLINICAL DATA:  Cough, history of cancer. EXAM: CHEST - 2 VIEW COMPARISON:  Chest x-ray 01/09/2022.  Chest CT 01/17/2022. FINDINGS: Right chest port catheter tip projects over the SVC. Surgical clips are seen in the right lung and along the right chest wall. Small right pleural effusion is stable. Pleural-based nodular density in the right upper lobe and right lower lobe densities appear unchanged. The left lung is clear. Cardiac silhouette within normal limits. Evidence for pneumothorax or acute fracture. IMPRESSION: 1. Stable appearance of chest with small right pleural effusion and right lung dense opacities. 2. No new focal lung infiltrate. Electronically Signed   By: Ronney Asters M.D.   On: 02/23/2022 22:41      Subjective: Patient reports feeling much improved.  Remains afebrile while hospitalization.   Denies any symptoms , feels more energetic and wants to be discharged.  Discharge Exam: Vitals:   02/25/22 0407 02/25/22 0705  BP: 135/77   Pulse: 84    Resp: 18   Temp: 98.6 F (37 C)   SpO2: 98% 94%   Vitals:   02/24/22 0951 02/24/22 2030 02/25/22 0407 02/25/22 0705  BP: 123/64 138/81 135/77   Pulse: 89 90 84   Resp: (!) 22 20 18    Temp: 98.9 F (37.2 C) 100.2 F (37.9 C) 98.6 F (37 C)   TempSrc: Oral     SpO2: 99% 99% 98% 94%  Weight:      Height:        General: Pt is alert, awake, not in acute distress Cardiovascular: RRR, S1/S2 +, no rubs, no gallops Respiratory: CTA bilaterally, no wheezing, no rhonchi Abdominal: Soft, NT, ND, bowel sounds + Extremities: no edema, no cyanosis    The results of significant diagnostics from this hospitalization (including imaging, microbiology, ancillary and laboratory) are listed below for reference.     Microbiology: Recent Results (from the past 240 hour(s))  Blood Culture (routine x  2)     Status: None (Preliminary result)   Collection Time: 02/23/22 10:11 PM   Specimen: BLOOD  Result Value Ref Range Status   Specimen Description   Final    BLOOD BLOOD LEFT HAND Performed at [facility] [address], [place], [place] [address]    Special Requests   Final    BOTTLES DRAWN AEROBIC AND ANAEROBIC [place] Performed at [facility] [address] [place], [place] [address]    Culture   Final    NO GROWTH 1 DAY Performed at [facility], [place] [address] [place], [place] [address]    Report Status PENDING  Incomplete  Blood Culture (routine x 2)     Status: None (Preliminary result)   Collection Time: 02/23/22 10:11 PM   Specimen: BLOOD  Result Value Ref Range Status   Specimen Description   Final    BLOOD BLLA Performed at [facility] [address], [place], [place] [address]    Special Requests   Final    BOTTLES DRAWN AEROBIC AND ANAEROBIC BCLV Performed at [facility] [address], [place], [place] [address]    Culture   Final    NO GROWTH 1 DAY Performed at [facility], [place] [address], [place], [place] [address]    Report Status PENDING  Incomplete  SARS Coronavirus 2 by RT PCR (hospital order, performed in [facility] hospital lab) *cepheid single result test* Anterior Nasal Swab     Status: None   Collection Time: 02/23/22 10:11 PM   Specimen: Anterior Nasal Swab  Result Value Ref Range Status   SARS Coronavirus 2 by RT PCR NEGATIVE NEGATIVE Final    Comment: (NOTE) SARS-CoV-2 target nucleic acids are NOT DETECTED.  The SARS-CoV-2 RNA is generally detectable in upper and lower respiratory specimens during the acute phase of infection. The lowest concentration of SARS-CoV-2 viral copies this assay can detect is 250 copies / mL. A negative result does not preclude SARS-CoV-2 infection and should not be used as the sole basis for treatment or other patient management decisions.  A negative result may occur with improper specimen collection / handling, submission of specimen other than nasopharyngeal swab, presence of viral mutation(s) within the areas targeted by this assay, and inadequate number of viral copies (<250 copies / mL). A negative result must be combined with clinical observations, patient history, and epidemiological information.  Fact Sheet for Patients:   https://www.patel.info/  Fact Sheet for Healthcare Providers: https://hall.com/  This test is not yet approved or  cleared by the [place] FDA and has been authorized for detection and/or diagnosis of SARS-CoV-2 by FDA under an Emergency Use Authorization (EUA).  This EUA will remain in effect (meaning this test can be used) for the duration of the COVID-19 declaration under Section 564(b)(1) of the Act, 21 U.S.C. section 360bbb-3(b)(1), unless the authorization is terminated or revoked sooner.  Performed at [facility], [place] [address] [place], [place] [address]   Respiratory (~20 pathogens) panel by  PCR     Status: None   Collection Time: 02/24/22  1:19 AM   Specimen: Nasopharyngeal Swab; Respiratory  Result Value Ref Range Status   Adenovirus NOT DETECTED NOT DETECTED Final   Coronavirus 229E NOT DETECTED NOT DETECTED Final    Comment: (NOTE) The Coronavirus on the Respiratory Panel, DOES NOT test for the novel  Coronavirus (2019 nCoV)    Coronavirus  HKU1 NOT DETECTED NOT DETECTED Final   Coronavirus NL63 NOT DETECTED NOT DETECTED Final   Coronavirus OC43 NOT DETECTED NOT DETECTED Final   Metapneumovirus NOT DETECTED NOT DETECTED Final   Rhinovirus / Enterovirus NOT DETECTED NOT DETECTED Final   Influenza A NOT DETECTED NOT DETECTED Final   Influenza B NOT DETECTED NOT DETECTED Final   Parainfluenza Virus 1 NOT DETECTED NOT DETECTED Final   Parainfluenza Virus 2 NOT DETECTED NOT DETECTED Final   Parainfluenza Virus 3 NOT DETECTED NOT DETECTED Final   Parainfluenza Virus 4 NOT DETECTED NOT DETECTED Final   Respiratory Syncytial Virus NOT DETECTED NOT DETECTED Final   Bordetella pertussis NOT DETECTED NOT DETECTED Final   Bordetella Parapertussis NOT DETECTED NOT DETECTED Final   Chlamydophila pneumoniae NOT DETECTED NOT DETECTED Final   Mycoplasma pneumoniae NOT DETECTED NOT DETECTED Final    Comment: Performed at [facility], [place] [address], [place], [place] [address]     Labs: BNP (last 3 results) No results for input(s): "BNP" in the last 8760 hours. Basic Metabolic Panel: Recent Labs  Lab 02/23/22 2211 02/24/22 0512 02/25/22 0454  NA 141 140  --   K 2.5* 3.0*  --   CL 107 104  --   CO2 30 31  --   GLUCOSE 56* 94  --   BUN 11 10  --   CREATININE 0.75 0.73  --   CALCIUM 8.8* 8.4*  --   MG  --  1.5* 1.6*  PHOS  --   --  2.2*   Liver Function Tests: Recent Labs  Lab 02/23/22 2211  AST 13*  ALT 15  ALKPHOS 101  BILITOT 0.5  PROT 6.7  ALBUMIN 3.1*   No results for input(s): "LIPASE", "AMYLASE" in the last 168 hours. No results for input(s):  "AMMONIA" in the last 168 hours. CBC: Recent Labs  Lab 02/23/22 2211 02/24/22 0512 02/25/22 0454  WBC 7.4 7.3 11.8*  NEUTROABS 4.7  --   --   HGB 9.4* 8.3* 8.4*  HCT 30.6* 27.5* 27.0*  MCV 86.7 87.6 87.9  PLT 66* 60* 56*   Cardiac Enzymes: No results for input(s): "CKTOTAL", "CKMB", "CKMBINDEX", "TROPONINI" in the last 168 hours. BNP: Invalid input(s): "POCBNP" CBG: Recent Labs  Lab 02/24/22 2356 02/25/22 0405 02/25/22 0722 02/25/22 0739 02/25/22 1137  GLUCAP 105* 82 66* 91 88   D-Dimer No results for input(s): "DDIMER" in the last 72 hours. Hgb A1c No results for input(s): "HGBA1C" in the last 72 hours. Lipid Profile No results for input(s): "CHOL", "HDL", "LDLCALC", "TRIG", "CHOLHDL", "LDLDIRECT" in the last 72 hours. Thyroid function studies No results for input(s): "TSH", "T4TOTAL", "T3FREE", "THYROIDAB" in the last 72 hours.  Invalid input(s): "FREET3" Anemia work up No results for input(s): "VITAMINB12", "FOLATE", "FERRITIN", "TIBC", "IRON", "RETICCTPCT" in the last 72 hours. Urinalysis    Component Value Date/Time   COLORURINE YELLOW 02/23/2022 2211   APPEARANCEUR CLEAR 02/23/2022 2211   LABSPEC 1.017 02/23/2022 2211   PHURINE 6.0 02/23/2022 2211   GLUCOSEU NEGATIVE 02/23/2022 2211   HGBUR SMALL (A) 02/23/2022 2211   BILIRUBINUR NEGATIVE 02/23/2022 2211   KETONESUR NEGATIVE 02/23/2022 2211   PROTEINUR 100 (A) 02/23/2022 2211   UROBILINOGEN 0.2 11/24/2006 0251   NITRITE NEGATIVE 02/23/2022 2211   LEUKOCYTESUR NEGATIVE 02/23/2022 2211   Sepsis Labs Recent Labs  Lab 02/23/22 2211 02/24/22 0512 02/25/22 0454  WBC 7.4 7.3 11.8*   Microbiology Recent Results (from the past 240 hour(s))  Blood  Culture (routine x 2)     Status: None (Preliminary result)   Collection Time: 02/23/22 10:11 PM   Specimen: BLOOD  Result Value Ref Range Status   Specimen Description   Final    BLOOD BLOOD LEFT HAND Performed at [facility]  [address] [place], [place] [address]    Special Requests   Final    BOTTLES DRAWN AEROBIC AND ANAEROBIC [place] Performed at [facility] [address] [place], [place] [address]    Culture   Final    NO GROWTH 1 DAY Performed at [facility], [place] [address] [place], [place] [address]    Report Status PENDING  Incomplete  Blood Culture (routine x 2)     Status: None (Preliminary result)   Collection Time: 02/23/22 10:11 PM   Specimen: BLOOD  Result Value Ref Range Status   Specimen Description   Final    BLOOD BLLA Performed at [facility] [address], [place], [place] [address]    Special Requests   Final    BOTTLES DRAWN AEROBIC AND ANAEROBIC BCLV Performed at [facility] [address], [place], [place] [address]    Culture   Final    NO GROWTH 1 DAY Performed at [facility], [place] [address] [place], [place] [address]    Report Status PENDING  Incomplete  SARS Coronavirus 2 by RT PCR (hospital order, performed in [facility] hospital lab) *cepheid single result test* Anterior Nasal Swab     Status: None   Collection Time: 02/23/22 10:11 PM   Specimen: Anterior Nasal Swab  Result Value Ref Range Status   SARS Coronavirus 2 by RT PCR NEGATIVE NEGATIVE Final    Comment: (NOTE) SARS-CoV-2 target nucleic acids are NOT DETECTED.  The SARS-CoV-2 RNA is generally detectable in upper and lower respiratory specimens during the acute phase of infection. The lowest concentration of SARS-CoV-2 viral copies this assay can detect is 250 copies / mL. A negative result does not preclude SARS-CoV-2 infection and should not be used as the sole basis for treatment or other patient management decisions.  A negative result may occur with improper specimen collection / handling, submission of specimen other than nasopharyngeal swab, presence of viral mutation(s) within the areas targeted by this assay, and inadequate  number of viral copies (<250 copies / mL). A negative result must be combined with clinical observations, patient history, and epidemiological information.  Fact Sheet for Patients:   https://www.patel.info/  Fact Sheet for Healthcare Providers: https://hall.com/  This test is not yet approved or  cleared by the [place] FDA and has been authorized for detection and/or diagnosis of SARS-CoV-2 by FDA under an Emergency Use Authorization (EUA).  This EUA will remain in effect (meaning this test can be used) for the duration of the COVID-19 declaration under Section 564(b)(1) of the Act, 21 U.S.C. section 360bbb-3(b)(1), unless the authorization is terminated or revoked sooner.  Performed at [facility], [place] [address], [place],  [address]   Respiratory (~20 pathogens) panel by PCR     Status: None   Collection Time: 02/24/22  1:19 AM   Specimen: Nasopharyngeal Swab; Respiratory  Result Value Ref Range Status   Adenovirus NOT DETECTED NOT DETECTED Final   Coronavirus 229E NOT DETECTED NOT DETECTED Final    Comment: (NOTE) The Coronavirus on the Respiratory Panel, DOES NOT test for the novel  Coronavirus (2019 nCoV)  Coronavirus HKU1 NOT DETECTED NOT DETECTED Final   Coronavirus NL63 NOT DETECTED NOT DETECTED Final   Coronavirus OC43 NOT DETECTED NOT DETECTED Final   Metapneumovirus NOT DETECTED NOT DETECTED Final   Rhinovirus / Enterovirus NOT DETECTED NOT DETECTED Final   Influenza A NOT DETECTED NOT DETECTED Final   Influenza B NOT DETECTED NOT DETECTED Final   Parainfluenza Virus 1 NOT DETECTED NOT DETECTED Final   Parainfluenza Virus 2 NOT DETECTED NOT DETECTED Final   Parainfluenza Virus 3 NOT DETECTED NOT DETECTED Final   Parainfluenza Virus 4 NOT DETECTED NOT DETECTED Final   Respiratory Syncytial Virus NOT DETECTED NOT DETECTED Final   Bordetella pertussis NOT DETECTED NOT DETECTED Final    Bordetella Parapertussis NOT DETECTED NOT DETECTED Final   Chlamydophila pneumoniae NOT DETECTED NOT DETECTED Final   Mycoplasma pneumoniae NOT DETECTED NOT DETECTED Final    Comment: Performed at [facility], [place] [address], [place], [place] [address]     Time coordinating discharge: Over 30 minutes  SIGNED:   Shawna Clamp, MD  [facility] 02/25/2022, 2:37 PM Pager   If 7PM-7AM, please contact night-coverage

## 2022-02-25 NOTE — Telephone Encounter (Signed)
Pt called advising he has been hospitialized since 02/23/22 and wants to cancel the lab appt as labs were drawn while inpt.  Appt has been cancelled and pt is aware.

## 2022-02-26 ENCOUNTER — Inpatient Hospital Stay: Payer: BC Managed Care – PPO

## 2022-02-26 ENCOUNTER — Inpatient Hospital Stay: Payer: BC Managed Care – PPO | Admitting: Internal Medicine

## 2022-02-28 MED FILL — Dexamethasone Sodium Phosphate Inj 100 MG/10ML: INTRAMUSCULAR | Qty: 1 | Status: AC

## 2022-03-01 LAB — CULTURE, BLOOD (ROUTINE X 2)
Culture: NO GROWTH
Culture: NO GROWTH

## 2022-03-02 NOTE — Progress Notes (Unsigned)
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 Roxan Hockey. 2) Sunitinib 3 7.5 mg p.o. daily.  First dose started April 08, 2021.  Status post 3 months of treatment.  This treatment was discontinued secondary to disease progression. 3) Votrient (pazopanib) 800 mg p.o. daily.  Started August 10, 2021.  Status post 2 months of treatment discontinued secondary to disease progression. 4) status post redo right thoracotomy with resection of pleural tumors and lymph node dissection under the care of Dr. Roxan Hockey on Nov 13, 2021.  CURRENT THERAPY: Systemic chemotherapy with doxorubicin 25 Mg/M2, ifosfamide 2500 Mg/M2 and mesna 2500 Mg/M2 days 1-3 every 3 weeks.  First dose February 12, 2022 at [facility].   INTERVAL HISTORY: Mathew [name] 61 y.o. male returns to the clinic today for follow-up visit accompanied by his wife. The patient was recently found to have evidence of disease progression. He is seeing Dr. Angelina [name] at [facility] who recommended chemotherapy with doxorubicin, Ifosfamide, and mesna.  He is status post 1 cycle which was given on 02/13/2022.  He presented to the emergency room on 02/23/2022 for the chief complaint of fever, cough, body aches, and vomiting.  The patient's wife had been diagnosed with COVID-19 just prior to this.  The patient tested negative throughout the course of his hospitalization.  He received supportive care for presumed viral illness including IV antibiotics and IV hydration and his symptoms improved.  Since being discharged, he is feeling ***.   Overall, since the patient has had disease progression he has had  significant fatigue and weakness as well as dyspnea at nighttime.  He also has some intermittent right-sided chest discomfort for which she takes ***.  Today he denies any more fevers, chills, night sweats, or unexplained weight loss.  Denies any nausea, vomiting, diarrhea, or constipation denies any headache or visual changes.  He is here today for evaluation and repeat blood work.  [facility] is planning on admitting him to the hospital for cycle #2 on 03/05/2022.  MEDICAL HISTORY: Past Medical History:  Diagnosis Date   Arthritis    osteo   Asthma    asthma as a child   Clubbing of nails    Deafness in left ear    GERD (gastroesophageal reflux disease)    History of kidney stones    Metastatic sarcoma (HCC)    Pneumonia    Shortness of breath dyspnea    just initially   Vitamin D deficiency     ALLERGIES:  is allergic to aspirin and penicillins.  MEDICATIONS:  Current Outpatient Medications  Medication Sig Dispense Refill   amLODipine (NORVASC) 5 MG tablet Take 10 mg by mouth daily.     ascorbic acid (VITAMIN C) 500 MG tablet Take 500 mg by mouth daily.     Cholecalciferol (VITAMIN D-3) 125 MCG (5000 UT) TABS Take 1 tablet by mouth daily.     Cholecalciferol (VITAMIN D3) 50 MCG (2000 UT) capsule Take 1 capsule (2,000 Units total) by mouth daily. (Patient not taking: Reported on 02/24/2022) 1 capsule 0   dexamethasone (DECADRON) 4 MG tablet Take 8 mg by mouth as directed. TAKE 2 TABLETS BY MOUTH IN THE MORNING FOR  2 DAYS FOLLOWING CHEMOTHERAPY     FEROCON capsule TAKE 1 CAPSULE BY MOUTH 2 (TWO) TIMES DAILY AFTER A MEAL. (Patient taking differently: Take 1 capsule by mouth 2 (two) times daily after a meal.) 60 capsule 1   Multiple Vitamins-Minerals (ZINC PO) Take 1 tablet by mouth daily.     OLANZapine (ZYPREXA) 5 MG tablet Take 5 mg by mouth as directed. TAKE 1 (5 MG) TABLET BY MOUTH AT NIGHT FOR 2 NIGHTS FOLLOWING CHEMOTHERAPY     prochlorperazine (COMPAZINE) 10 MG tablet Take 10 mg by  mouth every 6 (six) hours as needed for vomiting or nausea.     No current facility-administered medications for this visit.    SURGICAL HISTORY:  Past Surgical History:  Procedure Laterality Date   INTERCOSTAL NERVE BLOCK Right 04/27/2020   Procedure: INTERCOSTAL NERVE BLOCK;  Surgeon: [name], MD;  Location: [facility];  Service: Thoracic;  Laterality: Right;   KNEE CARTILAGE SURGERY Left 1976   pleural tumor Right 01/22/2016   resection of solitary fibrous tumor   RESECTION OF MEDIASTINAL MASS Right 01/23/2016   this is in error- no mediastinal tumor   RESECTION OF MEDIASTINAL MASS Right 11/13/2021   Procedure: RESECTION OF PLEURAL TUMORS;  Surgeon: [name], MD;  Location: [facility];  Service: Thoracic;  Laterality: Right;   THORACOTOMY Right 04/27/2020   Procedure: THORACOTOMY;  Surgeon: [name], MD;  Location: [facility];  Service: Thoracic;  Laterality: Right;   THORACOTOMY Right 11/13/2021   Procedure: REDO THORACOTOMY;  Surgeon: [name], MD;  Location: [facility];  Service: Thoracic;  Laterality: Right;   TONSILLECTOMY     VIDEO BRONCHOSCOPY WITH ENDOBRONCHIAL ULTRASOUND N/A 12/17/2015   Procedure: VIDEO BRONCHOSCOPY WITH ENDOBRONCHIAL ULTRASOUND;  Surgeon: [name], MD;  Location: [facility];  Service: Thoracic;  Laterality: N/A;    REVIEW OF SYSTEMS:   Review of Systems  Constitutional: Negative for appetite change, chills, fatigue, fever and unexpected weight change.  HENT:   Negative for mouth sores, nosebleeds, sore throat and trouble swallowing.   Eyes: Negative for eye problems and icterus.  Respiratory: Negative for cough, hemoptysis, shortness of breath and wheezing.   Cardiovascular: Negative for chest pain and leg swelling.  Gastrointestinal: Negative for abdominal pain, constipation, diarrhea, nausea and vomiting.  Genitourinary: Negative for bladder incontinence, difficulty urinating, dysuria, frequency and hematuria.    Musculoskeletal: Negative for back pain, gait problem, neck pain and neck stiffness.  Skin: Negative for itching and rash.  Neurological: Negative for dizziness, extremity weakness, gait problem, headaches, light-headedness and seizures.  Hematological: Negative for adenopathy. Does not bruise/bleed easily.  Psychiatric/Behavioral: Negative for confusion, depression and sleep disturbance. The patient is not nervous/anxious.     PHYSICAL EXAMINATION:  There were no vitals taken for this visit.  ECOG PERFORMANCE STATUS: {CHL ONC ECOG [id number]  Physical Exam  Constitutional: Oriented to person, place, and time and well-developed, well-nourished, and in no distress. No distress.  HENT:  Head: Normocephalic and atraumatic.  Mouth/Throat: Oropharynx is clear and moist. No oropharyngeal exudate.  Eyes: Conjunctivae are normal. Right eye exhibits no discharge. Left eye exhibits no discharge. No scleral icterus.  Neck: Normal range of motion. Neck supple.  Cardiovascular: Normal rate, regular rhythm, normal heart sounds and intact distal pulses.   Pulmonary/Chest: Effort normal and breath sounds normal. No respiratory distress. No wheezes. No rales.  Abdominal: Soft. Bowel sounds are normal. Exhibits no distension and no mass. There is no tenderness.  Musculoskeletal:  Normal range of motion. Exhibits no edema.  Lymphadenopathy:    No cervical adenopathy.  Neurological: Alert and oriented to person, place, and time. Exhibits normal muscle tone. Gait normal. Coordination normal.  Skin: Skin is warm and dry. No rash noted. Not diaphoretic. No erythema. No pallor.  Psychiatric: Mood, memory and judgment normal.  Vitals reviewed.  LABORATORY DATA: Lab Results  Component Value Date   WBC 11.8 (H) 02/25/2022   HGB 8.4 (L) 02/25/2022   HCT 27.0 (L) 02/25/2022   MCV 87.9 02/25/2022   PLT 56 (L) 02/25/2022      Chemistry      Component Value Date/Time   NA 140 02/24/2022 0512   K 3.0  (L) 02/24/2022 0512   CL 104 02/24/2022 0512   CO2 31 02/24/2022 0512   BUN 10 02/24/2022 0512   CREATININE 0.73 02/24/2022 0512   CREATININE 0.73 01/16/2022 0949      Component Value Date/Time   CALCIUM 8.4 (L) 02/24/2022 0512   ALKPHOS 101 02/23/2022 2211   AST 13 (L) 02/23/2022 2211   AST 13 (L) 01/16/2022 0949   ALT 15 02/23/2022 2211   ALT 10 01/16/2022 0949   BILITOT 0.5 02/23/2022 2211   BILITOT 0.4 01/16/2022 0949       RADIOGRAPHIC STUDIES:  DG Chest 2 View  Result Date: 02/23/2022 CLINICAL DATA:  Cough, history of cancer. EXAM: CHEST - 2 VIEW COMPARISON:  Chest x-ray 01/09/2022.  Chest CT 01/17/2022. FINDINGS: Right chest port catheter tip projects over the SVC. Surgical clips are seen in the right lung and along the right chest wall. Small right pleural effusion is stable. Pleural-based nodular density in the right upper lobe and right lower lobe densities appear unchanged. The left lung is clear. Cardiac silhouette within normal limits. Evidence for pneumothorax or acute fracture. IMPRESSION: 1. Stable appearance of chest with small right pleural effusion and right lung dense opacities. 2. No new focal lung infiltrate. Electronically Signed   By: Ronney Asters M.D.   On: 02/23/2022 22:41     ASSESSMENT/PLAN:  This is a very pleasant 61 year old male with recurrent solitary fibrous tumor of the right lung.  This was initially diagnosed in June 2017.  He is status post resection.  He had recurrence in October 2021 and underwent ray resection under the care of Dr. Roxan Hockey.  He had been on observation as there is no role for adjuvant therapy for his condition.  He was seen by Dr. Kendall Flack at [facility] who recommended observation and consideration of treatment with Sunitinib followed by surgery if the patient had disease recurrence.  The patient had a new 1.4 cm nodule posteriorly and the patient underwent treatment with sunitinib 37.5 mg p.o. daily.  He was on  this for 3 months.  He had been tolerating this fairly well except for hypertension.  Unfortunately scan showed evidence of disease progression with interval development of multiple new pleural-based nodules in the right hemithorax measuring up to 2.9 cm which is concerning for recurrent/metastatic disease.  He was then seen by Dr. Roxan Hockey who offered surgical resection.  Dr. Kathyrn Lass recommended neoadjuvant treatment with pazopanib for 8 weeks followed by surgical resection if he had improvement in his disease followed by continued adjuvant treatment for another 6 to 8 months.  Unfortunately, after 2 months of treatment the CT scan showed clear evidence of disease progression with enlarging right pleural-based masses.  He was referred to Dr. 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  Molecular studies by foundation 1 and PD-L1 did not show any actionable mutations and his PD-L1 expression was negative.  The patient then saw Dr. Angelina [name] at [facility] who recommended inpatient systemic chemotherapy with doxorubicin,ifosfamide, and mesna IV every 3 weeks.  He had a 2D echo as well as a Port-A-Cath placed.  He underwent his first cycle of treatment on 02/12/2022.  The patient is scheduled to undergo cycle #2 at the [facility] on 03/05/2022.  The patient wishes to ***  We will not arrange for any follow-up appointments at this time but would be happy to see the patient on an as-needed basis  The patient was advised to call immediately if he has any concerning symptoms in the interval. The patient voices understanding of current disease status and treatment options and is in agreement with the current care plan. All questions were answered. The patient knows to  call the clinic with any problems, questions or concerns. We can certainly see the patient much sooner if necessary      No orders of the defined types were placed in this encounter.    I spent {CHL ONC TIME VISIT - PYKDX:[id number]} counseling the patient face to face. The total time spent in the appointment was {CHL ONC TIME VISIT - JQBHA:[id number]}.  Jennessy Sandridge L Maliah Pyles, PA-C 03/02/22

## 2022-03-03 ENCOUNTER — Other Ambulatory Visit: Payer: Self-pay

## 2022-03-03 ENCOUNTER — Inpatient Hospital Stay: Payer: BC Managed Care – PPO

## 2022-03-03 ENCOUNTER — Inpatient Hospital Stay (HOSPITAL_BASED_OUTPATIENT_CLINIC_OR_DEPARTMENT_OTHER): Payer: BC Managed Care – PPO | Admitting: Physician Assistant

## 2022-03-03 VITALS — BP 169/91 | HR 102 | Temp 98.1°F | Resp 18 | Ht 69.0 in | Wt 183.9 lb

## 2022-03-03 DIAGNOSIS — D492 Neoplasm of unspecified behavior of bone, soft tissue, and skin: Secondary | ICD-10-CM

## 2022-03-03 DIAGNOSIS — C3491 Malignant neoplasm of unspecified part of right bronchus or lung: Secondary | ICD-10-CM | POA: Diagnosis present

## 2022-03-03 DIAGNOSIS — I10 Essential (primary) hypertension: Secondary | ICD-10-CM | POA: Diagnosis not present

## 2022-03-03 LAB — CBC WITH DIFFERENTIAL (CANCER CENTER ONLY)
Abs Immature Granulocytes: 2.8 10*3/uL — ABNORMAL HIGH (ref 0.00–0.07)
Basophils Absolute: 0 10*3/uL (ref 0.0–0.1)
Basophils Relative: 0 %
Eosinophils Absolute: 0 10*3/uL (ref 0.0–0.5)
Eosinophils Relative: 0 %
HCT: 30.5 % — ABNORMAL LOW (ref 39.0–52.0)
Hemoglobin: 9.4 g/dL — ABNORMAL LOW (ref 13.0–17.0)
Lymphocytes Relative: 11 %
Lymphs Abs: 1.9 10*3/uL (ref 0.7–4.0)
MCH: 26.9 pg (ref 26.0–34.0)
MCHC: 30.8 g/dL (ref 30.0–36.0)
MCV: 87.1 fL (ref 80.0–100.0)
Metamyelocytes Relative: 5 %
Monocytes Absolute: 1.2 10*3/uL — ABNORMAL HIGH (ref 0.1–1.0)
Monocytes Relative: 7 %
Myelocytes: 11 %
Neutro Abs: 11.5 10*3/uL — ABNORMAL HIGH (ref 1.7–7.7)
Neutrophils Relative %: 66 %
Platelet Count: 143 10*3/uL — ABNORMAL LOW (ref 150–400)
RBC: 3.5 MIL/uL — ABNORMAL LOW (ref 4.22–5.81)
RDW: 19.6 % — ABNORMAL HIGH (ref 11.5–15.5)
WBC Count: 17.4 10*3/uL — ABNORMAL HIGH (ref 4.0–10.5)
nRBC: 2.7 % — ABNORMAL HIGH (ref 0.0–0.2)

## 2022-03-03 LAB — CMP (CANCER CENTER ONLY)
ALT: 21 U/L (ref 0–44)
AST: 25 U/L (ref 15–41)
Albumin: 3.3 g/dL — ABNORMAL LOW (ref 3.5–5.0)
Alkaline Phosphatase: 192 U/L — ABNORMAL HIGH (ref 38–126)
Anion gap: 8 (ref 5–15)
BUN: 10 mg/dL (ref 8–23)
CO2: 28 mmol/L (ref 22–32)
Calcium: 8.8 mg/dL — ABNORMAL LOW (ref 8.9–10.3)
Chloride: 104 mmol/L (ref 98–111)
Creatinine: 0.69 mg/dL (ref 0.61–1.24)
GFR, Estimated: >60 mL/min (ref >60)
Glucose, Bld: 121 mg/dL — ABNORMAL HIGH (ref 70–99)
Potassium: 3.9 mmol/L (ref 3.5–5.1)
Sodium: 140 mmol/L (ref 135–145)
Total Bilirubin: 0.3 mg/dL (ref 0.3–1.2)
Total Protein: 6.2 g/dL — ABNORMAL LOW (ref 6.5–8.1)

## 2022-03-05 ENCOUNTER — Encounter: Payer: Self-pay | Admitting: Internal Medicine

## 2022-03-06 ENCOUNTER — Inpatient Hospital Stay: Payer: BC Managed Care – PPO

## 2022-03-12 NOTE — Progress Notes (Unsigned)
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 Roxan Hockey. 2) Sunitinib 3 7.5 mg p.o. daily.  First dose started April 08, 2021.  Status post 3 months of treatment.  This treatment was discontinued secondary to disease progression. 3) Votrient (pazopanib) 800 mg p.o. daily.  Started August 10, 2021.  Status post 2 months of treatment discontinued secondary to disease progression. 4) status post redo right thoracotomy with resection of pleural tumors and lymph node dissection under the care of Dr. Roxan Hockey on Nov 13, 2021.  CURRENT THERAPY: Systemic chemotherapy with doxorubicin 25 Mg/M2, ifosfamide 2500 Mg/M2 and mesna 2500 Mg/M2 days 1-3 every 3 weeks.  First dose February 12, 2022 at [facility]. Status post 2 cycles. C3 expected on 03/26/22  INTERVAL HISTORY: Mathew [name] 61 y.o. male returns to the clinic today for a 1 week toxicity check.  The patient is currently undergoing treatment with AIM.  He receives this at [facility].  He was admitted for cycle #2 on 03/05/22.  He tolerated this fairly well except for some moderate fluid overload which was treated with IV Lasix.  He is prescribed Lasix outpatient every other day.  His last dose of Lasix is tomorrow.  He is not on a potassium supplement and he has some hypokalemia on labs today.  Also during the course of his admission, he did have some issues with hypoglycemia for which endocrinology was consulted.  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  Therefore he was discharged on prednisone 20 mg p.o. daily.  He sees the endocrinologist next for follow-up visit.  The  patient is trying to make sure he eats frequently despite not having a good appetite.  He did have an issue with low blood sugar this morning and his blood sugar was 38.  He is trying to avoid sugary foods and eating more foods with protein.  Overall he tolerated cycle #2 better than cycle #1.  He had a few episodes of vomiting without any nausea.  He reports that the pain secondary to the Neulasta injection was better with cycle #2 because he has been taking Claritin.  Of note, the patient is requesting having his Neulasta injection performed here at our clinic to avoid having to drive to [facility].   He continues to have stable fatigue.  He does have anemia.  The patient did receive 1 unit of blood during his most recent hospital admission.  He denies any fever, chills, night sweats or unexplained weight loss.  The patient's right-sided chest discomfort and right shoulder pain has improved recently.  He is not taking any tramadol or Tylenol.  He has had some mild constipation a few days following treatment but this improved with MiraLAX.  He is here today for evaluation and repeat blood work.    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  MEDICATIONS:  Current Outpatient Medications  Medication Sig Dispense Refill   amLODipine (NORVASC) 10 MG tablet Take 20 mg by mouth daily.     ascorbic acid (VITAMIN C) 500 MG tablet Take 500 mg by mouth daily.     Cholecalciferol (VITAMIN D-3) 125 MCG (5000 UT) TABS Take 1 tablet by mouth daily.     dexamethasone (DECADRON) 4 MG tablet Take 8 mg by mouth as directed. TAKE 2 TABLETS BY MOUTH IN THE MORNING FOR  2 DAYS FOLLOWING CHEMOTHERAPY     FEROCON capsule TAKE 1 CAPSULE BY MOUTH 2 (TWO) TIMES DAILY AFTER A MEAL. (Patient taking differently: Take 1 capsule by mouth 2 (two) times daily after a meal.) 60 capsule 1   Multiple Vitamins-Minerals (ZINC PO) Take 1 tablet by mouth daily.     OLANZapine (ZYPREXA) 5 MG tablet Take 5 mg by mouth as directed. TAKE 1 (5 MG) TABLET BY MOUTH AT NIGHT FOR 2 NIGHTS FOLLOWING CHEMOTHERAPY     potassium chloride SA (KLOR-CON M) 20 MEQ tablet Take 1 tablet (20 mEq total) by mouth 2 (two) times daily. 14 tablet 0   prochlorperazine (COMPAZINE) 10 MG tablet Take 10 mg by mouth every 6 (six) hours as needed for vomiting or nausea.     No current facility-administered medications for this visit.    SURGICAL HISTORY:  Past Surgical History:  Procedure Laterality Date   INTERCOSTAL NERVE BLOCK Right 04/27/2020   Procedure: INTERCOSTAL NERVE BLOCK;  Surgeon: [name], MD;  Location: [facility];  Service: Thoracic;  Laterality: Right;   KNEE CARTILAGE SURGERY Left 1976   pleural tumor Right 01/22/2016   resection of solitary fibrous tumor   RESECTION OF MEDIASTINAL MASS Right 01/23/2016   this is in error- no mediastinal tumor   RESECTION OF MEDIASTINAL MASS Right 11/13/2021   Procedure: RESECTION OF PLEURAL TUMORS;  Surgeon: [name], MD;  Location: [facility] OR;  Service: Thoracic;  Laterality: Right;   THORACOTOMY Right 04/27/2020   Procedure: THORACOTOMY;  Surgeon: [name], MD;  Location: [facility];  Service: Thoracic;  Laterality: Right;   THORACOTOMY Right 11/13/2021   Procedure: REDO THORACOTOMY;  Surgeon: [name], MD;  Location: [facility];  Service: Thoracic;  Laterality: Right;   TONSILLECTOMY     VIDEO BRONCHOSCOPY WITH ENDOBRONCHIAL ULTRASOUND N/A 12/17/2015   Procedure: VIDEO BRONCHOSCOPY WITH ENDOBRONCHIAL ULTRASOUND;  Surgeon: [name], MD;  Location: [facility];  Service: Thoracic;  Laterality: N/A;    REVIEW OF  SYSTEMS:   Review of Systems  Constitutional: Positive for stable fatigue and generalized weakness.  Negative for appetite change, chills, fever and unexpected weight change.  HENT: Negative for mouth sores, nosebleeds, sore throat and trouble swallowing.   Eyes: Negative for eye problems and icterus.  Respiratory: Positive for improved but persistent dyspnea on exertion and laying down.  Negative for cough, hemoptysis, and wheezing.   Cardiovascular: Positive for lower extremity swelling bilaterally and improved right-sided chest discomfort that radiates to his shoulder. Gastrointestinal: Negative for abdominal pain, constipation (none at this time), diarrhea, nausea and vomiting (none at this time).  Genitourinary: Negative for bladder incontinence, difficulty urinating, dysuria, frequency and hematuria.   Musculoskeletal: Negative for back pain, gait problem, neck pain and neck stiffness.  Skin: Negative for itching and rash.  Neurological: Negative for dizziness,  extremity weakness, gait problem, headaches, light-headedness and seizures.  Hematological: Negative for adenopathy. Does not bruise/bleed easily.  Psychiatric/Behavioral: Negative for confusion, depression and sleep disturbance. The patient is not nervous/anxious.     PHYSICAL EXAMINATION:  Blood pressure (!) 151/76, pulse 94, temperature 98.8 F (37.1 C), temperature source Temporal, resp. rate 16, weight 183 lb 9.6 oz (83.3 kg), SpO2 98 %.  ECOG PERFORMANCE STATUS: 1-2  Physical Exam  Constitutional: Oriented to person, place, and time and well-developed, well-nourished, and in no distress.  HENT:  Head: Normocephalic and atraumatic.  Mouth/Throat: Oropharynx is clear and moist. No oropharyngeal exudate.  Eyes: Conjunctivae are normal. Right eye exhibits no discharge. Left eye exhibits no discharge. No scleral icterus.  Neck: Normal range of motion. Neck supple.  Cardiovascular: Normal rate, regular rhythm, normal heart  sounds and intact distal pulses.   Pulmonary/Chest: Effort normal.  Decreased breath sounds in the right lower lung field.  No respiratory distress. No wheezes. No rales.  Abdominal: Soft. Bowel sounds are normal. Exhibits no distension and no mass. There is no tenderness.  Musculoskeletal: Normal range of motion.  Bilateral lower extremity swelling/pitting edema. Lymphadenopathy:    No cervical adenopathy.  Neurological: Alert and oriented to person, place, and time. Exhibits normal muscle tone. Gait normal. Coordination normal.  Skin: Skin is warm and dry. No rash noted. Not diaphoretic. No erythema. No pallor.  Psychiatric: Mood, memory and judgment normal.  Vitals reviewed.  LABORATORY DATA: Lab Results  Component Value Date   WBC 9.0 03/13/2022   HGB 8.4 (L) 03/13/2022   HCT 26.8 (L) 03/13/2022   MCV 84.8 03/13/2022   PLT 263 03/13/2022      Chemistry      Component Value Date/Time   NA 143 03/13/2022 1444   K 3.0 (L) 03/13/2022 1444   CL 107 03/13/2022 1444   CO2 31 03/13/2022 1444   BUN 17 03/13/2022 1444   CREATININE 1.00 03/13/2022 1444      Component Value Date/Time   CALCIUM 8.5 (L) 03/13/2022 1444   ALKPHOS 132 (H) 03/13/2022 1444   AST 13 (L) 03/13/2022 1444   ALT 16 03/13/2022 1444   BILITOT 0.3 03/13/2022 1444       RADIOGRAPHIC STUDIES:  DG Chest 2 View  Result Date: 02/23/2022 CLINICAL DATA:  Cough, history of cancer. EXAM: CHEST - 2 VIEW COMPARISON:  Chest x-ray 01/09/2022.  Chest CT 01/17/2022. FINDINGS: Right chest port catheter tip projects over the SVC. Surgical clips are seen in the right lung and along the right chest wall. Small right pleural effusion is stable. Pleural-based nodular density in the right upper lobe and right lower lobe densities appear unchanged. The left lung is clear. Cardiac silhouette within normal limits. Evidence for pneumothorax or acute fracture. IMPRESSION: 1. Stable appearance of chest with small right pleural effusion  and right lung dense opacities. 2. No new focal lung infiltrate. Electronically Signed   By: Ronney Asters M.D.   On: 02/23/2022 22:41     ASSESSMENT/PLAN:  This is a very pleasant 61 year old male with recurrent solitary fibrous tumor of the right lung.  This was initially diagnosed in June 2017.   He is status post resection.  He had recurrence in October 2021 and underwent re-resection under the care of Dr. Roxan Hockey.  He had been on observation as there is no role for adjuvant therapy for his condition.  He was seen by Dr. Kendall Flack at [facility] who recommended observation and consideration of treatment  with Sunitinib followed by surgery if the patient had disease recurrence.   The patient had a new 1.4 cm nodule posteriorly and the patient underwent treatment with sunitinib 37.5 mg p.o. daily.  He was on this for 3 months. He had been tolerating this fairly well except for hypertension.  Unfortunately scan showed evidence of disease progression with interval development of multiple new pleural-based nodules in the right hemithorax measuring up to 2.9 cm which is concerning for recurrent/metastatic disease.   He was then seen by Dr. Roxan Hockey who offered surgical resection.  Dr. Kendall Flack recommended neoadjuvant treatment with pazopanib for 8 weeks followed by surgical resection if he had improvement in his disease followed by continued adjuvant treatment for another 6 to 8 months.  Unfortunately, after 2 months of treatment the CT scan showed clear evidence of disease progression with enlarging right pleural-based masses.  He was referred to Dr. 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  Molecular studies by foundation 1 and PD-L1 did not show any actionable  mutations and his PD-L1 expression was negative.   The patient then saw Dr. Angelina Ok at [facility] who recommended inpatient systemic chemotherapy with doxorubicin,ifosfamide, and mesna IV every 3 weeks.  He had a 2D echo as well as a Port-A-Cath placed.  He underwent his first cycle of treatment on 02/12/2022. Cycle #2 was on 03/05/22.   He is scheduled for cycle #3 on 03/26/22.   The patient discussed with his provider at [facility] who recommended weekly lab appointments.  He also recommended reaching out to [place] to see if we would be able to arrange his GF injections locally.  We will be happy to try to get this approved for the patient to avoid lengthy trips back and forth to [place].  His next GF injection would be due on 03/31/2022.  He will continue to take Claritin to avoid myalgias and arthralgias secondary to his injection.  We will see the patient back for a 1 week follow-up toxicity check after cycle #3 which will be on 10/19.  Once again, we will monitor his labs on a weekly basis.  Reviewed the patient's labs with him today.  The patient does have some anemia with a hemoglobin of 8.4.  I will arrange for him to have a sample of blood being performed next week and we will arrange for a blood transfusion if his hemoglobin drops below 8.  He also has hypokalemia on labs likely secondary to his Lasix use.  I have sent him a prescription of potassium supplements twice a day for the next 7 days.  I will recheck his potassium on his blood work next week.  He is taking Lasix on an outpatient basis but his last day is tomorrow.  If he continues to routine fluid I encouraged him to reach out to his providers at [facility] for a refill.  If this is something he is going to be on a regular basis, they may want to consider adding potassium supplements.  He is expected to see endocrinology next week regarding his hypoglycemia.  He is currently taking 20 mg of prednisone daily.  He is keeping  Gatorade next to his bed and eating frequently his blood sugar on labs today is 100.  The patient was advised to  call immediately if he has any concerning symptoms in the interval. The patient voices understanding of current disease status and treatment options and is in agreement with the current care plan. All questions were answered. The patient knows to call the clinic with any problems, questions or concerns. We can certainly see the patient much sooner if necessary       Orders Placed This Encounter  Procedures   Sample to Blood Bank    Standing Status:   Standing    Number of Occurrences:   10    Standing Expiration Date:   03/14/2023     The total time spent in the appointment was 20-29 minutes.   Cledith Abdou L Leeah Politano, PA-C 03/13/22

## 2022-03-13 ENCOUNTER — Inpatient Hospital Stay: Payer: BC Managed Care – PPO

## 2022-03-13 ENCOUNTER — Encounter: Payer: Self-pay | Admitting: Physician Assistant

## 2022-03-13 ENCOUNTER — Other Ambulatory Visit: Payer: Self-pay

## 2022-03-13 ENCOUNTER — Inpatient Hospital Stay (HOSPITAL_BASED_OUTPATIENT_CLINIC_OR_DEPARTMENT_OTHER): Payer: BC Managed Care – PPO | Admitting: Physician Assistant

## 2022-03-13 VITALS — BP 151/76 | HR 94 | Temp 98.8°F | Resp 16 | Wt 183.6 lb

## 2022-03-13 DIAGNOSIS — D492 Neoplasm of unspecified behavior of bone, soft tissue, and skin: Secondary | ICD-10-CM

## 2022-03-13 DIAGNOSIS — Z7689 Persons encountering health services in other specified circumstances: Secondary | ICD-10-CM

## 2022-03-13 DIAGNOSIS — D649 Anemia, unspecified: Secondary | ICD-10-CM

## 2022-03-13 DIAGNOSIS — E876 Hypokalemia: Secondary | ICD-10-CM | POA: Diagnosis not present

## 2022-03-13 DIAGNOSIS — Z9189 Other specified personal risk factors, not elsewhere classified: Secondary | ICD-10-CM | POA: Insufficient documentation

## 2022-03-13 DIAGNOSIS — C3491 Malignant neoplasm of unspecified part of right bronchus or lung: Secondary | ICD-10-CM | POA: Diagnosis not present

## 2022-03-13 LAB — CMP (CANCER CENTER ONLY)
ALT: 16 U/L (ref 0–44)
AST: 13 U/L — ABNORMAL LOW (ref 15–41)
Albumin: 3.2 g/dL — ABNORMAL LOW (ref 3.5–5.0)
Alkaline Phosphatase: 132 U/L — ABNORMAL HIGH (ref 38–126)
Anion gap: 5 (ref 5–15)
BUN: 17 mg/dL (ref 8–23)
CO2: 31 mmol/L (ref 22–32)
Calcium: 8.5 mg/dL — ABNORMAL LOW (ref 8.9–10.3)
Chloride: 107 mmol/L (ref 98–111)
Creatinine: 1 mg/dL (ref 0.61–1.24)
GFR, Estimated: >60 mL/min (ref >60)
Glucose, Bld: 100 mg/dL — ABNORMAL HIGH (ref 70–99)
Potassium: 3 mmol/L — ABNORMAL LOW (ref 3.5–5.1)
Sodium: 143 mmol/L (ref 135–145)
Total Bilirubin: 0.3 mg/dL (ref 0.3–1.2)
Total Protein: 5.8 g/dL — ABNORMAL LOW (ref 6.5–8.1)

## 2022-03-13 LAB — CBC WITH DIFFERENTIAL (CANCER CENTER ONLY)
Abs Immature Granulocytes: 0 10*3/uL (ref 0.00–0.07)
Band Neutrophils: 2 %
Basophils Absolute: 0 10*3/uL (ref 0.0–0.1)
Basophils Relative: 0 %
Eosinophils Absolute: 0 10*3/uL (ref 0.0–0.5)
Eosinophils Relative: 0 %
HCT: 26.8 % — ABNORMAL LOW (ref 39.0–52.0)
Hemoglobin: 8.4 g/dL — ABNORMAL LOW (ref 13.0–17.0)
Lymphocytes Relative: 7 %
Lymphs Abs: 0.6 10*3/uL — ABNORMAL LOW (ref 0.7–4.0)
MCH: 26.6 pg (ref 26.0–34.0)
MCHC: 31.3 g/dL (ref 30.0–36.0)
MCV: 84.8 fL (ref 80.0–100.0)
Monocytes Absolute: 0.1 10*3/uL (ref 0.1–1.0)
Monocytes Relative: 1 %
Neutro Abs: 8.3 10*3/uL — ABNORMAL HIGH (ref 1.7–7.7)
Neutrophils Relative %: 90 %
Platelet Count: 263 10*3/uL (ref 150–400)
RBC: 3.16 MIL/uL — ABNORMAL LOW (ref 4.22–5.81)
RDW: 18.4 % — ABNORMAL HIGH (ref 11.5–15.5)
Smear Review: NORMAL
WBC Count: 9 10*3/uL (ref 4.0–10.5)
nRBC: 0 % (ref 0.0–0.2)

## 2022-03-13 MED ORDER — POTASSIUM CHLORIDE CRYS ER 20 MEQ PO TBCR: 20.0000 meq | EXTENDED_RELEASE_TABLET | Freq: Two times a day (BID) | ORAL | 0 refills | Status: DC

## 2022-03-18 ENCOUNTER — Telehealth: Payer: Self-pay | Admitting: Physician Assistant

## 2022-03-18 NOTE — Telephone Encounter (Signed)
Called patient regarding upcoming October, November and December appointments. Patient does not have voicemail set up, calender will be mailed and will attempt to call again.

## 2022-03-20 ENCOUNTER — Inpatient Hospital Stay: Payer: BC Managed Care – PPO | Attending: Internal Medicine

## 2022-03-20 DIAGNOSIS — Z5189 Encounter for other specified aftercare: Secondary | ICD-10-CM | POA: Diagnosis not present

## 2022-03-20 DIAGNOSIS — C3491 Malignant neoplasm of unspecified part of right bronchus or lung: Secondary | ICD-10-CM | POA: Diagnosis present

## 2022-03-20 DIAGNOSIS — D649 Anemia, unspecified: Secondary | ICD-10-CM

## 2022-03-20 DIAGNOSIS — D492 Neoplasm of unspecified behavior of bone, soft tissue, and skin: Secondary | ICD-10-CM

## 2022-03-20 LAB — CBC WITH DIFFERENTIAL (CANCER CENTER ONLY)
Abs Immature Granulocytes: 4.5 10*3/uL — ABNORMAL HIGH (ref 0.00–0.07)
Band Neutrophils: 18 %
Basophils Absolute: 0 10*3/uL (ref 0.0–0.1)
Basophils Relative: 0 %
Eosinophils Absolute: 0.3 10*3/uL (ref 0.0–0.5)
Eosinophils Relative: 1 %
HCT: 28.5 % — ABNORMAL LOW (ref 39.0–52.0)
Hemoglobin: 9.1 g/dL — ABNORMAL LOW (ref 13.0–17.0)
Lymphocytes Relative: 5 %
Lymphs Abs: 1.6 10*3/uL (ref 0.7–4.0)
MCH: 27.5 pg (ref 26.0–34.0)
MCHC: 31.9 g/dL (ref 30.0–36.0)
MCV: 86.1 fL (ref 80.0–100.0)
Metamyelocytes Relative: 7 %
Monocytes Absolute: 2.9 10*3/uL — ABNORMAL HIGH (ref 0.1–1.0)
Monocytes Relative: 9 %
Myelocytes: 6 %
Neutro Abs: 22.9 10*3/uL — ABNORMAL HIGH (ref 1.7–7.7)
Neutrophils Relative %: 53 %
Platelet Count: 78 10*3/uL — ABNORMAL LOW (ref 150–400)
Promyelocytes Relative: 1 %
RBC: 3.31 MIL/uL — ABNORMAL LOW (ref 4.22–5.81)
RDW: 19.8 % — ABNORMAL HIGH (ref 11.5–15.5)
WBC Count: 32.2 10*3/uL — ABNORMAL HIGH (ref 4.0–10.5)
nRBC: 4.4 % — ABNORMAL HIGH (ref 0.0–0.2)

## 2022-03-20 LAB — CMP (CANCER CENTER ONLY)
ALT: 19 U/L (ref 0–44)
AST: 26 U/L (ref 15–41)
Albumin: 3.6 g/dL (ref 3.5–5.0)
Alkaline Phosphatase: 160 U/L — ABNORMAL HIGH (ref 38–126)
Anion gap: 4 — ABNORMAL LOW (ref 5–15)
BUN: 13 mg/dL (ref 8–23)
CO2: 33 mmol/L — ABNORMAL HIGH (ref 22–32)
Calcium: 9 mg/dL (ref 8.9–10.3)
Chloride: 105 mmol/L (ref 98–111)
Creatinine: 0.7 mg/dL (ref 0.61–1.24)
GFR, Estimated: >60 mL/min (ref >60)
Glucose, Bld: 110 mg/dL — ABNORMAL HIGH (ref 70–99)
Potassium: 3.2 mmol/L — ABNORMAL LOW (ref 3.5–5.1)
Sodium: 142 mmol/L (ref 135–145)
Total Bilirubin: 0.4 mg/dL (ref 0.3–1.2)
Total Protein: 6.5 g/dL (ref 6.5–8.1)

## 2022-03-20 LAB — SAMPLE TO BLOOD BANK

## 2022-03-27 ENCOUNTER — Other Ambulatory Visit: Payer: BC Managed Care – PPO

## 2022-03-31 ENCOUNTER — Inpatient Hospital Stay: Payer: BC Managed Care – PPO

## 2022-03-31 VITALS — BP 146/85 | HR 96 | Temp 99.1°F | Resp 18

## 2022-03-31 DIAGNOSIS — Z5111 Encounter for antineoplastic chemotherapy: Secondary | ICD-10-CM

## 2022-03-31 DIAGNOSIS — Z7689 Persons encountering health services in other specified circumstances: Secondary | ICD-10-CM

## 2022-03-31 DIAGNOSIS — C3491 Malignant neoplasm of unspecified part of right bronchus or lung: Secondary | ICD-10-CM | POA: Diagnosis not present

## 2022-03-31 MED ORDER — PEGFILGRASTIM-CBQV 6 MG/0.6ML ~~LOC~~ SOSY
6.0000 mg | PREFILLED_SYRINGE | Freq: Once | SUBCUTANEOUS | Status: AC
  Administered 2022-03-31: 6 mg via SUBCUTANEOUS
  Filled 2022-03-31: qty 0.6

## 2022-04-03 ENCOUNTER — Inpatient Hospital Stay: Payer: BC Managed Care – PPO

## 2022-04-03 ENCOUNTER — Other Ambulatory Visit: Payer: Self-pay | Admitting: *Deleted

## 2022-04-03 ENCOUNTER — Other Ambulatory Visit: Payer: Self-pay | Admitting: Physician Assistant

## 2022-04-03 ENCOUNTER — Other Ambulatory Visit: Payer: Self-pay

## 2022-04-03 ENCOUNTER — Ambulatory Visit: Payer: BC Managed Care – PPO | Admitting: Physician Assistant

## 2022-04-03 ENCOUNTER — Other Ambulatory Visit: Payer: Self-pay | Admitting: Medical Oncology

## 2022-04-03 DIAGNOSIS — D649 Anemia, unspecified: Secondary | ICD-10-CM

## 2022-04-03 DIAGNOSIS — D492 Neoplasm of unspecified behavior of bone, soft tissue, and skin: Secondary | ICD-10-CM

## 2022-04-03 DIAGNOSIS — C3491 Malignant neoplasm of unspecified part of right bronchus or lung: Secondary | ICD-10-CM | POA: Diagnosis not present

## 2022-04-03 LAB — CBC WITH DIFFERENTIAL (CANCER CENTER ONLY)
Abs Immature Granulocytes: 0.1 10*3/uL — ABNORMAL HIGH (ref 0.00–0.07)
Band Neutrophils: 2 %
Basophils Absolute: 0 10*3/uL (ref 0.0–0.1)
Basophils Relative: 0 %
Eosinophils Absolute: 0 10*3/uL (ref 0.0–0.5)
Eosinophils Relative: 0 %
HCT: 24.7 % — ABNORMAL LOW (ref 39.0–52.0)
Hemoglobin: 7.9 g/dL — ABNORMAL LOW (ref 13.0–17.0)
Lymphocytes Relative: 1 %
Lymphs Abs: 0.1 10*3/uL — ABNORMAL LOW (ref 0.7–4.0)
MCH: 28.5 pg (ref 26.0–34.0)
MCHC: 32 g/dL (ref 30.0–36.0)
MCV: 89.2 fL (ref 80.0–100.0)
Metamyelocytes Relative: 1 %
Monocytes Absolute: 0.1 10*3/uL (ref 0.1–1.0)
Monocytes Relative: 1 %
Neutro Abs: 8 10*3/uL — ABNORMAL HIGH (ref 1.7–7.7)
Neutrophils Relative %: 95 %
Platelet Count: 149 10*3/uL — ABNORMAL LOW (ref 150–400)
RBC: 2.77 MIL/uL — ABNORMAL LOW (ref 4.22–5.81)
RDW: 21.5 % — ABNORMAL HIGH (ref 11.5–15.5)
Smear Review: NORMAL
WBC Count: 8.2 10*3/uL (ref 4.0–10.5)
nRBC: 0 % (ref 0.0–0.2)

## 2022-04-03 LAB — CMP (CANCER CENTER ONLY)
ALT: 19 U/L (ref 0–44)
AST: 13 U/L — ABNORMAL LOW (ref 15–41)
Albumin: 3.4 g/dL — ABNORMAL LOW (ref 3.5–5.0)
Alkaline Phosphatase: 110 U/L (ref 38–126)
Anion gap: 3 — ABNORMAL LOW (ref 5–15)
BUN: 20 mg/dL (ref 8–23)
CO2: 30 mmol/L (ref 22–32)
Calcium: 8.8 mg/dL — ABNORMAL LOW (ref 8.9–10.3)
Chloride: 112 mmol/L — ABNORMAL HIGH (ref 98–111)
Creatinine: 0.95 mg/dL (ref 0.61–1.24)
GFR, Estimated: >60 mL/min (ref >60)
Glucose, Bld: 77 mg/dL (ref 70–99)
Potassium: 3.9 mmol/L (ref 3.5–5.1)
Sodium: 145 mmol/L (ref 135–145)
Total Bilirubin: 0.5 mg/dL (ref 0.3–1.2)
Total Protein: 5.8 g/dL — ABNORMAL LOW (ref 6.5–8.1)

## 2022-04-03 LAB — PREPARE RBC (CROSSMATCH)

## 2022-04-03 LAB — SAMPLE TO BLOOD BANK

## 2022-04-04 ENCOUNTER — Inpatient Hospital Stay: Payer: BC Managed Care – PPO

## 2022-04-04 ENCOUNTER — Inpatient Hospital Stay: Payer: BC Managed Care – PPO | Admitting: Physician Assistant

## 2022-04-04 DIAGNOSIS — C3491 Malignant neoplasm of unspecified part of right bronchus or lung: Secondary | ICD-10-CM | POA: Diagnosis not present

## 2022-04-04 DIAGNOSIS — D649 Anemia, unspecified: Secondary | ICD-10-CM

## 2022-04-04 MED ORDER — ACETAMINOPHEN 325 MG PO TABS
650.0000 mg | ORAL_TABLET | Freq: Once | ORAL | Status: AC
  Administered 2022-04-04: 650 mg via ORAL
  Filled 2022-04-04: qty 2

## 2022-04-04 MED ORDER — SODIUM CHLORIDE 0.9% FLUSH
10.0000 mL | INTRAVENOUS | Status: AC | PRN
  Administered 2022-04-04: 10 mL

## 2022-04-04 MED ORDER — SODIUM CHLORIDE 0.9% IV SOLUTION
250.0000 mL | Freq: Once | INTRAVENOUS | Status: AC
  Administered 2022-04-04: 250 mL via INTRAVENOUS

## 2022-04-04 MED ORDER — DIPHENHYDRAMINE HCL 25 MG PO CAPS
25.0000 mg | ORAL_CAPSULE | Freq: Once | ORAL | Status: AC
  Administered 2022-04-04: 25 mg via ORAL
  Filled 2022-04-04: qty 1

## 2022-04-04 MED ORDER — HEPARIN SOD (PORK) LOCK FLUSH 100 UNIT/ML IV SOLN
500.0000 [IU] | Freq: Every day | INTRAVENOUS | Status: AC | PRN
  Administered 2022-04-04: 500 [IU]

## 2022-04-04 NOTE — Patient Instructions (Signed)

## 2022-04-06 ENCOUNTER — Other Ambulatory Visit: Payer: Self-pay | Admitting: Thoracic Surgery (Cardiothoracic Vascular Surgery)

## 2022-04-07 ENCOUNTER — Other Ambulatory Visit: Payer: Self-pay | Admitting: Thoracic Surgery (Cardiothoracic Vascular Surgery)

## 2022-04-07 DIAGNOSIS — R918 Other nonspecific abnormal finding of lung field: Secondary | ICD-10-CM

## 2022-04-07 LAB — TYPE AND SCREEN
ABO/RH(D): O POS
Antibody Screen: NEGATIVE
Unit division: 0
Unit division: 0

## 2022-04-07 LAB — BPAM RBC
Blood Product Expiration Date: 202311192359
Blood Product Expiration Date: 202311192359
ISSUE DATE / TIME: 202310200908
ISSUE DATE / TIME: 202310200908
Unit Type and Rh: 5100
Unit Type and Rh: 5100

## 2022-04-08 ENCOUNTER — Ambulatory Visit: Payer: 59 | Admitting: Thoracic Surgery (Cardiothoracic Vascular Surgery)

## 2022-04-10 ENCOUNTER — Inpatient Hospital Stay: Payer: BC Managed Care – PPO | Admitting: Physician Assistant

## 2022-04-10 ENCOUNTER — Encounter: Payer: Self-pay | Admitting: Internal Medicine

## 2022-04-10 ENCOUNTER — Inpatient Hospital Stay: Payer: BC Managed Care – PPO

## 2022-04-14 ENCOUNTER — Encounter: Payer: Self-pay | Admitting: Physician Assistant

## 2022-04-14 ENCOUNTER — Encounter: Payer: Self-pay | Admitting: Medical Oncology

## 2022-04-15 ENCOUNTER — Encounter: Payer: Self-pay | Admitting: Physician Assistant

## 2022-04-17 ENCOUNTER — Inpatient Hospital Stay: Payer: BC Managed Care – PPO | Attending: Internal Medicine

## 2022-04-17 ENCOUNTER — Telehealth: Payer: Self-pay | Admitting: Medical Oncology

## 2022-04-17 ENCOUNTER — Other Ambulatory Visit: Payer: Self-pay

## 2022-04-17 ENCOUNTER — Inpatient Hospital Stay: Payer: BC Managed Care – PPO | Admitting: Internal Medicine

## 2022-04-17 VITALS — BP 143/74 | HR 99 | Temp 97.9°F | Resp 16

## 2022-04-17 DIAGNOSIS — Z79899 Other long term (current) drug therapy: Secondary | ICD-10-CM | POA: Diagnosis not present

## 2022-04-17 DIAGNOSIS — I10 Essential (primary) hypertension: Secondary | ICD-10-CM | POA: Diagnosis not present

## 2022-04-17 DIAGNOSIS — Z5111 Encounter for antineoplastic chemotherapy: Secondary | ICD-10-CM | POA: Diagnosis not present

## 2022-04-17 DIAGNOSIS — Z9221 Personal history of antineoplastic chemotherapy: Secondary | ICD-10-CM | POA: Insufficient documentation

## 2022-04-17 DIAGNOSIS — D649 Anemia, unspecified: Secondary | ICD-10-CM

## 2022-04-17 DIAGNOSIS — R5383 Other fatigue: Secondary | ICD-10-CM | POA: Insufficient documentation

## 2022-04-17 DIAGNOSIS — E162 Hypoglycemia, unspecified: Secondary | ICD-10-CM | POA: Diagnosis not present

## 2022-04-17 DIAGNOSIS — Z79630 Long term (current) use of alkylating agent: Secondary | ICD-10-CM | POA: Insufficient documentation

## 2022-04-17 DIAGNOSIS — D492 Neoplasm of unspecified behavior of bone, soft tissue, and skin: Secondary | ICD-10-CM | POA: Diagnosis not present

## 2022-04-17 DIAGNOSIS — C3491 Malignant neoplasm of unspecified part of right bronchus or lung: Secondary | ICD-10-CM | POA: Diagnosis present

## 2022-04-17 DIAGNOSIS — K219 Gastro-esophageal reflux disease without esophagitis: Secondary | ICD-10-CM | POA: Diagnosis not present

## 2022-04-17 DIAGNOSIS — Z87442 Personal history of urinary calculi: Secondary | ICD-10-CM | POA: Insufficient documentation

## 2022-04-17 DIAGNOSIS — Z7952 Long term (current) use of systemic steroids: Secondary | ICD-10-CM | POA: Diagnosis not present

## 2022-04-17 DIAGNOSIS — D61818 Other pancytopenia: Secondary | ICD-10-CM | POA: Insufficient documentation

## 2022-04-17 DIAGNOSIS — E559 Vitamin D deficiency, unspecified: Secondary | ICD-10-CM | POA: Insufficient documentation

## 2022-04-17 LAB — CMP (CANCER CENTER ONLY)
ALT: 26 U/L (ref 0–44)
AST: 22 U/L (ref 15–41)
Albumin: 3.8 g/dL (ref 3.5–5.0)
Alkaline Phosphatase: 116 U/L (ref 38–126)
Anion gap: 3 — ABNORMAL LOW (ref 5–15)
BUN: 15 mg/dL (ref 8–23)
CO2: 32 mmol/L (ref 22–32)
Calcium: 9.4 mg/dL (ref 8.9–10.3)
Chloride: 106 mmol/L (ref 98–111)
Creatinine: 0.71 mg/dL (ref 0.61–1.24)
GFR, Estimated: >60 mL/min (ref >60)
Glucose, Bld: 42 mg/dL — CL (ref 70–99)
Potassium: 3.9 mmol/L (ref 3.5–5.1)
Sodium: 141 mmol/L (ref 135–145)
Total Bilirubin: 0.5 mg/dL (ref 0.3–1.2)
Total Protein: 6.5 g/dL (ref 6.5–8.1)

## 2022-04-17 LAB — CBC WITH DIFFERENTIAL (CANCER CENTER ONLY)
Abs Immature Granulocytes: 1.44 10*3/uL — ABNORMAL HIGH (ref 0.00–0.07)
Basophils Absolute: 0.2 10*3/uL — ABNORMAL HIGH (ref 0.0–0.1)
Basophils Relative: 1 %
Eosinophils Absolute: 0 10*3/uL (ref 0.0–0.5)
Eosinophils Relative: 0 %
HCT: 33.5 % — ABNORMAL LOW (ref 39.0–52.0)
Hemoglobin: 10.8 g/dL — ABNORMAL LOW (ref 13.0–17.0)
Immature Granulocytes: 10 %
Lymphocytes Relative: 4 %
Lymphs Abs: 0.6 10*3/uL — ABNORMAL LOW (ref 0.7–4.0)
MCH: 30 pg (ref 26.0–34.0)
MCHC: 32.2 g/dL (ref 30.0–36.0)
MCV: 93.1 fL (ref 80.0–100.0)
Monocytes Absolute: 1.1 10*3/uL — ABNORMAL HIGH (ref 0.1–1.0)
Monocytes Relative: 8 %
Neutro Abs: 11.3 10*3/uL — ABNORMAL HIGH (ref 1.7–7.7)
Neutrophils Relative %: 77 %
Platelet Count: 197 10*3/uL (ref 150–400)
RBC: 3.6 MIL/uL — ABNORMAL LOW (ref 4.22–5.81)
RDW: 24.2 % — ABNORMAL HIGH (ref 11.5–15.5)
WBC Count: 14.7 10*3/uL — ABNORMAL HIGH (ref 4.0–10.5)
nRBC: 2.1 % — ABNORMAL HIGH (ref 0.0–0.2)

## 2022-04-17 LAB — SAMPLE TO BLOOD BANK

## 2022-04-17 MED ORDER — SODIUM CHLORIDE 0.9% FLUSH
10.0000 mL | INTRAVENOUS | Status: DC | PRN
  Administered 2022-04-17: 10 mL via INTRAVENOUS

## 2022-04-17 MED ORDER — HEPARIN SOD (PORK) LOCK FLUSH 100 UNIT/ML IV SOLN
500.0000 [IU] | Freq: Once | INTRAVENOUS | Status: AC
  Administered 2022-04-17: 500 [IU] via INTRAVENOUS

## 2022-04-17 NOTE — Telephone Encounter (Signed)
CRITICAL VALUE STICKER  CRITICAL VALUE:Glucose =42  RECEIVER (on-site recipient of call):Mathew [name]  DATE & TIME NOTIFIED: 04/17/22  MESSENGER (representative from lab):Lelan Pons  MD NOTIFIED: Julien Nordmann  TIME OF NOTIFICATION:1217  RESPONSE:  call pt to drink orange juice . I called pt and he already knew his glucose was low when he was in the lab. His "[employer]" alarmed and he started to feel symptomatic and he drank Orange juice.

## 2022-04-17 NOTE — Progress Notes (Signed)
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 Roxan Hockey. 2) Sunitinib 3 7.5 mg p.o. daily.  First dose started April 08, 2021.  Status post 3 months of treatment.  This treatment was discontinued secondary to disease progression. 3) Votrient (pazopanib) 800 mg p.o. daily.  Started August 10, 2021.  Status post 2 months of treatment discontinued secondary to disease progression. 4) status post redo right thoracotomy with resection of pleural tumors and lymph node dissection under the care of Dr. Roxan Hockey on Nov 13, 2021. 5) Systemic chemotherapy with doxorubicin 25 Mg/M2, ifosfamide 2500 Mg/M2 and mesna 2500 Mg/M2 days 1-3 every 3 weeks.  First dose February 12, 2022 at [facility].  Status post 2 cycles of treatment discontinued secondary to disease progression.  CURRENT THERAPY: He is expected to start treatment with systemic chemotherapy with Temodar 200 Mg/M2 on days 1-7 and day 15-21 with a Avastin 5 Mg/KG on days 8 and 22 every 4 weeks.  INTERVAL HISTORY: Mathew MILLIKAN 61 y.o. male returns to the clinic today for follow-up visit accompanied by his wife.  The patient is feeling fine today with no concerning complaints except for the intermittent pain on the right side of the chest as well as shortness of breath with exertion.  He also continues to complain of fatigue.  He denied having any current nausea, vomiting, diarrhea or constipation.  He has no fever or chills.  He denied having any significant weight loss or night sweats.  He was treated with 2 cycles of systemic chemotherapy with doxorubicin,  ifosfamide and mesna at [facility] under the care of Dr. Angelina Ok.  Repeat CT scan of the chest at [facility] showed evidence for disease progression with enlargement of some of the pleural-based masses. Dr. Angelina Ok recommended for the patient treatment with Temodar and Avastin.  He is expected to start this treatment soon.  He is here today for evaluation and recommendation regarding his condition and whether he can receive his treatment with Avastin in [place].   MEDICAL HISTORY: Past Medical History:  Diagnosis Date   Arthritis    osteo   Asthma    asthma as a child   Clubbing of nails    Deafness in left ear    GERD (gastroesophageal reflux disease)    History of kidney stones    Metastatic sarcoma (HCC)    Pneumonia    Shortness of breath dyspnea    just initially   Vitamin D deficiency     ALLERGIES:  is allergic to aspirin and penicillins.  MEDICATIONS:  Current Outpatient Medications  Medication Sig Dispense Refill   amLODipine (NORVASC) 10 MG tablet Take 20 mg by mouth daily.     ascorbic acid (VITAMIN C) 500 MG tablet Take 500 mg by mouth daily.     Cholecalciferol (VITAMIN D-3) 125 MCG (5000 UT) TABS Take 1 tablet by mouth daily.     dexamethasone (DECADRON) 4 MG tablet Take 8 mg by mouth as directed. TAKE 2 TABLETS BY MOUTH IN THE MORNING FOR 2 DAYS FOLLOWING CHEMOTHERAPY     FEROCON capsule TAKE 1 CAPSULE BY MOUTH  2 (TWO) TIMES DAILY AFTER A MEAL. (Patient taking differently: Take 1 capsule by mouth 2 (two) times daily after a meal.) 60 capsule 1   Multiple Vitamins-Minerals (ZINC PO) Take 1 tablet by mouth daily.     OLANZapine (ZYPREXA) 5 MG tablet Take 5 mg by mouth as directed. TAKE 1 (5 MG) TABLET BY MOUTH AT NIGHT FOR 2 NIGHTS FOLLOWING CHEMOTHERAPY     potassium chloride SA (KLOR-CON M) 20 MEQ tablet Take 1 tablet (20 mEq total) by mouth 2 (two) times daily. 14 tablet 0   prochlorperazine (COMPAZINE) 10 MG tablet Take 10 mg by mouth every 6 (six)  hours as needed for vomiting or nausea.     No current facility-administered medications for this visit.    SURGICAL HISTORY:  Past Surgical History:  Procedure Laterality Date   INTERCOSTAL NERVE BLOCK Right 04/27/2020   Procedure: INTERCOSTAL NERVE BLOCK;  Surgeon: Hendrickson, Steven C, MD;  Location: [facility] OR;  Service: Thoracic;  Laterality: Right;   KNEE CARTILAGE SURGERY Left 1976   pleural tumor Right 01/22/2016   resection of solitary fibrous tumor   RESECTION OF MEDIASTINAL MASS Right 01/23/2016   this is in error- no mediastinal tumor   RESECTION OF MEDIASTINAL MASS Right 11/13/2021   Procedure: RESECTION OF PLEURAL TUMORS;  Surgeon: Hendrickson, Steven C, MD;  Location: [facility] OR;  Service: Thoracic;  Laterality: Right;   THORACOTOMY Right 04/27/2020   Procedure: THORACOTOMY;  Surgeon: Hendrickson, Steven C, MD;  Location: [facility] OR;  Service: Thoracic;  Laterality: Right;   THORACOTOMY Right 11/13/2021   Procedure: REDO THORACOTOMY;  Surgeon: Hendrickson, Steven C, MD;  Location: [facility] OR;  Service: Thoracic;  Laterality: Right;   TONSILLECTOMY     VIDEO BRONCHOSCOPY WITH ENDOBRONCHIAL ULTRASOUND N/A 12/17/2015   Procedure: VIDEO BRONCHOSCOPY WITH ENDOBRONCHIAL ULTRASOUND;  Surgeon: Steven C Hendrickson, MD;  Location: [facility] OR;  Service: Thoracic;  Laterality: N/A;    REVIEW OF SYSTEMS:  Constitutional: positive for fatigue Eyes: negative Ears, nose, mouth, throat, and face: negative Respiratory: positive for dyspnea on exertion and pleurisy/chest pain Cardiovascular: negative Gastrointestinal: negative Genitourinary:negative Integument/breast: negative Hematologic/lymphatic: negative Musculoskeletal:negative Neurological: negative Behavioral/Psych: negative Endocrine: negative Allergic/Immunologic: negative   PHYSICAL EXAMINATION: General appearance: alert, cooperative, fatigued, and no distress Head: Normocephalic, without obvious abnormality, atraumatic Neck: no adenopathy, no  JVD, supple, symmetrical, trachea midline, and thyroid not enlarged, symmetric, no tenderness/mass/nodules Lymph nodes: Cervical, supraclavicular, and axillary nodes normal. Resp: diminished breath sounds RLL and dullness to percussion RLL Back: symmetric, no curvature. ROM normal. No CVA tenderness. Cardio: regular rate and rhythm, S1, S2 normal, no murmur, click, rub or gallop GI: soft, non-tender; bowel sounds normal; no masses,  no organomegaly Extremities: extremities normal, atraumatic, no cyanosis or edema Neurologic: Alert and oriented X 3, normal strength and tone. Normal symmetric reflexes. Normal coordination and gait  ECOG PERFORMANCE STATUS: 1 - Symptomatic but completely ambulatory  Blood pressure (!) 143/74, pulse 99, temperature 97.9 F (36.6 C), resp. rate 16, SpO2 99 %.  LABORATORY DATA: Lab Results  Component Value Date   WBC 14.7 (H) 04/17/2022   HGB 10.8 (L) 04/17/2022   HCT 33.5 (L) 04/17/2022   MCV 93.1 04/17/2022   PLT 197 04/17/2022      Chemistry      Component Value Date/Time   NA 145 04/03/2022 1321   K 3.9 04/03/2022 1321   CL 112 (H) 04/03/2022 1321   CO2 30 04/03/2022 1321   BUN 20 04/03/2022 1321   CREATININE 0.95   04/03/2022 1321      Component Value Date/Time   CALCIUM 8.8 (L) 04/03/2022 1321   ALKPHOS 110 04/03/2022 1321   AST 13 (L) 04/03/2022 1321   ALT 19 04/03/2022 1321   BILITOT 0.5 04/03/2022 1321       RADIOGRAPHIC STUDIES: No results found.  ASSESSMENT AND PLAN: This is a very pleasant 61 years old male with recurrent solitary fibrous tumor of the right lung diagnosed in June 2017 s/p resection followed by recurrence in October 2021 status post reresection under the care of Dr. Roxan Hockey. The patient has been on observation since his resection.  There was no role for adjuvant therapy for his condition.  He was seen by Dr. Kendall Flack at [facility] who recommended for him observation and consideration of treatment with  sunitinib followed by surgery if the patient has disease recurrence He had repeat CT scan of the chest performed recently.  The scan showed stable disease except for a new 1.4 cm nodular component posteriorly. The patient is currently undergoing treatment with sunitinib 37.5 mg p.o. daily status post 3 months of treatment.  The patient has been tolerating his treatment with Sutent fairly well except for the hypertension. Unfortunately has a scan showed evidence for disease progression with interval development of multiple new pleural-based nodules in the right hemithorax measuring up to 2.9 cm concerning for recurrent/metastatic disease. The patient was seen recently by Dr. Roxan Hockey who offered surgical resection by Dr. Kendall Flack recommended neoadjuvant treatment with pazopanib for 8 weeks followed by surgical resection if the patient has a stable or improvement of his disease followed by adjuvant treatment with Pazopanib for another 6-8 months. He was treated with pazopanib 800 mg p.o. daily.  He is status post 2 months of treatment. Repeat CT scan showed clear evidence for disease progression with enlarging right pleural-based masses. The patient was referred to Dr. 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  I sent the tissue block to [facility] for molecular studies and PD-L1 expression.  Unfortunately the molecular studies showed no actionable mutations and his PD-L1 expression was negative. The patient was seen recently by Dr. Angelina Ok at [facility] and he received treatment with inpatient systemic chemotherapy with doxorubicin, ifosfamide and mesna every 3 weeks.  Status post 2 cycles.  He had fatigue and pancytopenia with this treatment. His treatment was  discontinued secondary to disease progression. Dr. Angelina Ok recommended for the patient treatment with Temodar 200 Mg/M2 on days 1-7 and 15-21 in addition to Avastin 5 Mg/M2 on days 8 and 22 every 4 weeks.  The patient is expected to receive his Temodar from the pharmacy at [facility] to start treatment soon. I explained to the patient that I will be happy to give him the treatment with Avastin locally in [place].  He will call me immediately once he has Temodar available to start his oral treatment so I can arrange his treatment with Avastin on days 8 and 22.. For the pain management the patient will continue with his current pain medication. For the hypoglycemia, he is currently on treatment with prednisone.  I advised the patient to drink orange juice and monitor his blood sugar closely at home for any other hypoglycemic episodes. The patient was advised to call immediately if he has  any other concerning symptoms in the interval. The patient voices understanding of current disease status and treatment options and is in agreement with the current care plan.  All questions were answered. The patient knows to call the clinic with any problems, questions or concerns. We can certainly see the patient much sooner if necessary.   Disclaimer: This note was dictated with voice recognition software. Similar sounding words can inadvertently be transcribed and may not be corrected upon review.        

## 2022-04-21 ENCOUNTER — Inpatient Hospital Stay: Payer: BC Managed Care – PPO

## 2022-04-24 ENCOUNTER — Inpatient Hospital Stay: Payer: BC Managed Care – PPO

## 2022-04-24 ENCOUNTER — Encounter: Payer: Self-pay | Admitting: Internal Medicine

## 2022-04-24 ENCOUNTER — Other Ambulatory Visit: Payer: Self-pay | Admitting: Lab

## 2022-04-26 ENCOUNTER — Other Ambulatory Visit: Payer: Self-pay | Admitting: Internal Medicine

## 2022-04-26 DIAGNOSIS — D492 Neoplasm of unspecified behavior of bone, soft tissue, and skin: Secondary | ICD-10-CM

## 2022-04-26 NOTE — Progress Notes (Signed)
DISCONTINUE OFF PATHWAY REGIMEN - [facility]   [id number]:Doxorubicin 25 mg/m2 D1-3 + Ifosfamide 3 grams/m2 D1-3 + Mesna D1-3 + G-CSF D4 q21 Days:   A cycle is every 21 days :     Doxorubicin      Mesna      Ifosfamide      Mesna      Mesna      [id number]-xxxx   **Always confirm dose/schedule in your pharmacy ordering system**  REASON: Disease Progression PRIOR TREATMENT: Off Pathway: Doxorubicin 25 mg/m2 D1-3 + Ifosfamide 3 grams/m2 D1-3 + Mesna D1-3 + G-CSF D4 q21 Days TREATMENT RESPONSE: Progressive Disease (PD)  START ON PATHWAY REGIMEN - [place]     A cycle is every 28 days:     Bevacizumab-xxxx      Temozolomide   **Always confirm dose/schedule in your pharmacy ordering system**  Patient Characteristics: Other [place] Subtypes, Unresectable / Metastatic, Solitary Fibrous Tumor Histology/Anatomic Site: Solitary Fibrous Tumor  Intent of Therapy: Non-Curative / Palliative Intent, Discussed with Patient

## 2022-04-28 ENCOUNTER — Encounter: Payer: Self-pay | Admitting: Medical Oncology

## 2022-04-29 NOTE — Progress Notes (Signed)
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 Roxan Hockey. 2) Sunitinib 3 7.5 mg p.o. daily.  First dose started April 08, 2021.  Status post 3 months of treatment.  This treatment was discontinued secondary to disease progression. 3) Votrient (pazopanib) 800 mg p.o. daily.  Started August 10, 2021.  Status post 2 months of treatment discontinued secondary to disease progression. 4) status post redo right thoracotomy with resection of pleural tumors and lymph node dissection under the care of Dr. Roxan Hockey on Nov 13, 2021. 5) Systemic chemotherapy with doxorubicin 25 Mg/M2, ifosfamide 2500 Mg/M2 and mesna 2500 Mg/M2 days 1-3 every 3 weeks.  First dose February 12, 2022 at [facility].  Status post 2 cycles of treatment discontinued secondary to disease progression.   CURRENT THERAPY: He is expected to start treatment with systemic chemotherapy with Temodar 200 Mg/M2 on days 1-7 and day 15-21 with a Avastin 5 Mg/KG on days 8 and 22 every 4 weeks.   INTERVAL HISTORY: Mathew [name] 61 y.o. male returns to the clinic today for follow-up visit accompanied by his wife.  The patient completed 2 cycles of AIM chemotherapy under the care of Dr. Angelina Ok over at the [facility].  He had restaging imaging after 2 cycles which, unfortunately, showed disease progression.  Therefore, his treatment was discontinued and he is here to try to start day 8 of his new treatment for which he is currently on Temodar and Avastin.  He tolerated his Temodar well without any concerning adverse side effects on day 1 through 5, although he started having manageable fatigue on  days 6 and 7.    He denies any nausea, vomiting, diarrhea, or constipation. Denies any fever, chills, night sweats, or unexplained weight loss.  His dyspnea on exertion is stable. He is having some mild nasal congestion and thinks he may have a mild viral infection. He denies significant cough. He denies any hemoptysis.  He does have some right-sided chest discomfort due to the pleural-based disease. He states this is similar to his baseline. 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 He is currently on prednisone for this. He took his anti-hypertensive as scheduled. Denies any abnormal bleeding or bruising.  He denies abdominal pain. He is here today for evaluation repeat blood work before undergoing day 8 of cycle #1.      MEDICAL HISTORY: Past Medical History:  Diagnosis Date   Arthritis    osteo   Asthma    asthma as a child   Clubbing of nails    Deafness in left ear    GERD (gastroesophageal reflux disease)    History of kidney stones    Metastatic sarcoma (HCC)    Pneumonia    Shortness of breath dyspnea    just initially   Vitamin D deficiency     ALLERGIES:  is allergic to aspirin and penicillins.  MEDICATIONS:  Current Outpatient Medications  Medication Sig Dispense Refill   Accu-Chek Softclix Lancets lancets SMARTSIG:Topical     amLODipine (NORVASC) 10 MG tablet Take 20 mg by mouth daily.     ascorbic acid (VITAMIN C)  500 MG tablet Take 500 mg by mouth daily.     Cholecalciferol (VITAMIN D-3) 125 MCG (5000 UT) TABS Take 1 tablet by mouth daily.     dexamethasone (DECADRON) 4 MG tablet Take 8 mg by mouth as directed. TAKE 2 TABLETS BY MOUTH IN THE MORNING FOR 2 DAYS FOLLOWING CHEMOTHERAPY     FEROCON capsule TAKE 1 CAPSULE BY MOUTH 2 (TWO) TIMES DAILY AFTER A MEAL. (Patient taking differently: Take 1 capsule by mouth 2 (two) times daily after a meal.) 60 capsule 1   Multiple Vitamins-Minerals (ZINC PO) Take 1 tablet  by mouth daily.     OLANZapine (ZYPREXA) 5 MG tablet Take 5 mg by mouth as directed. TAKE 1 (5 MG) TABLET BY MOUTH AT NIGHT FOR 2 NIGHTS FOLLOWING CHEMOTHERAPY     Potassium Chloride ER 20 MEQ TBCR Take 1 tablet by mouth daily.     predniSONE (DELTASONE) 10 MG tablet Take 10 mg by mouth daily with breakfast. 10 mg in am and 20 mg in pm  "Takes for low Blood glucose"     prochlorperazine (COMPAZINE) 10 MG tablet Take 10 mg by mouth every 6 (six) hours as needed for vomiting or nausea.     sulfamethoxazole-trimethoprim (BACTRIM) 400-80 MG tablet Take 1 tablet by mouth daily. States does is 80 mg /daily     temozolomide (TEMODAR) 5 MG capsule Take 100 mg by mouth daily. Take 3 capsules  hs on days 1-7 and 15-21.  Off on days 8-14 and days 22-28. May take on an empty stomach or at bedtime to decrease nausea/vomiting.     No current facility-administered medications for this visit.    SURGICAL HISTORY:  Past Surgical History:  Procedure Laterality Date   INTERCOSTAL NERVE BLOCK Right 04/27/2020   Procedure: INTERCOSTAL NERVE BLOCK;  Surgeon: [name], MD;  Location: [facility];  Service: Thoracic;  Laterality: Right;   KNEE CARTILAGE SURGERY Left 1976   pleural tumor Right 01/22/2016   resection of solitary fibrous tumor   RESECTION OF MEDIASTINAL MASS Right 01/23/2016   this is in error- no mediastinal tumor   RESECTION OF MEDIASTINAL MASS Right 11/13/2021   Procedure: RESECTION OF PLEURAL TUMORS;  Surgeon: [name], MD;  Location: [facility] OR;  Service: Thoracic;  Laterality: Right;   THORACOTOMY Right 04/27/2020   Procedure: THORACOTOMY;  Surgeon: [name], MD;  Location: [facility];  Service: Thoracic;  Laterality: Right;   THORACOTOMY Right 11/13/2021   Procedure: REDO THORACOTOMY;  Surgeon: [name], MD;  Location: [facility];  Service: Thoracic;  Laterality: Right;   TONSILLECTOMY     VIDEO BRONCHOSCOPY WITH ENDOBRONCHIAL ULTRASOUND N/A 12/17/2015    Procedure: VIDEO BRONCHOSCOPY WITH ENDOBRONCHIAL ULTRASOUND;  Surgeon: [name], MD;  Location: [facility];  Service: Thoracic;  Laterality: N/A;    REVIEW OF SYSTEMS:   Review of Systems  Constitutional: Positive for stable fatigue. Negative for appetite change, chills, fever and unexpected weight change.  HENT: Negative for mouth sores, nosebleeds, sore throat and trouble swallowing.   Eyes: Negative for eye problems and icterus.  Respiratory: Positive for stable dyspnea on exertion. Negative for cough, hemoptysis, and wheezing.   Cardiovascular: Positive for stable lower extremity swelling bilaterally and right-sided chest discomfort. Gastrointestinal: Negative for abdominal pain, constipation, diarrhea, nausea and vomiting.  Genitourinary: Negative for bladder incontinence, difficulty urinating, dysuria, frequency and hematuria.   Musculoskeletal: Negative for back pain, gait problem, neck pain and neck stiffness.  Skin: Negative for  itching and rash.  Neurological: Negative for dizziness, extremity weakness, gait problem, headaches, light-headedness and seizures.  Hematological: Negative for adenopathy. Does not bruise/bleed easily.  Psychiatric/Behavioral: Negative for confusion, depression and sleep disturbance. The patient is not nervous/anxious.    PHYSICAL EXAMINATION:  Blood pressure (!) 145/90, pulse (!) 108, temperature 98.9 F (37.2 C), temperature source Oral, resp. rate 16, weight 188 lb 3.2 oz (85.4 kg), SpO2 97 %.  ECOG PERFORMANCE STATUS: 1  Physical Exam  Constitutional: Oriented to person, place, and time and well-developed, well-nourished, and in no distress.  HENT:  Head: Normocephalic and atraumatic.  Mouth/Throat: Oropharynx is clear and moist. No oropharyngeal exudate.  Eyes: Conjunctivae are normal. Right eye exhibits no discharge. Left eye exhibits no discharge. No scleral icterus.  Neck: Normal range of motion. Neck supple.  Cardiovascular: Normal  rate, regular rhythm, normal heart sounds and intact distal pulses.   Pulmonary/Chest: Effort normal.  Decreased breath sounds in the right lower lung field.  No respiratory distress. No wheezes. No rales.  Abdominal: Soft. Bowel sounds are normal. Exhibits no distension and no mass. There is no tenderness.  Musculoskeletal: Normal range of motion.  Bilateral lower extremity swelling/pitting edema. Lymphadenopathy:    No cervical adenopathy.  Neurological: Alert and oriented to person, place, and time. Exhibits normal muscle tone. Gait normal. Coordination normal.  Skin: Skin is warm and dry. No rash noted. Not diaphoretic. No erythema. No pallor.  Psychiatric: Mood, memory and judgment normal.  Vitals reviewed.  LABORATORY DATA: Lab Results  Component Value Date   WBC 12.6 (H) 05/01/2022   HGB 11.1 (L) 05/01/2022   HCT 35.0 (L) 05/01/2022   MCV 97.0 05/01/2022   PLT 158 05/01/2022      Chemistry      Component Value Date/Time   NA 141 04/17/2022 1112   K 3.9 04/17/2022 1112   CL 106 04/17/2022 1112   CO2 32 04/17/2022 1112   BUN 15 04/17/2022 1112   CREATININE 0.71 04/17/2022 1112      Component Value Date/Time   CALCIUM 9.4 04/17/2022 1112   ALKPHOS 116 04/17/2022 1112   AST 22 04/17/2022 1112   ALT 26 04/17/2022 1112   BILITOT 0.5 04/17/2022 1112       RADIOGRAPHIC STUDIES:  No results found.   ASSESSMENT/PLAN:  This is a very pleasant 61 year old male with recurrent solitary fibrous tumor of the right lung.  This was initially diagnosed in June 2017.   He is status post resection.  He had recurrence in October 2021 and underwent re-resection under the care of Dr. Roxan Hockey.  He had been on observation as there is no role for adjuvant therapy for his condition.  He was seen by Dr. Kendall Flack at [facility] who recommended observation and consideration of treatment with Sunitinib followed by surgery if the patient had disease recurrence.   The patient had  a new 1.4 cm nodule posteriorly and the patient underwent treatment with sunitinib 37.5 mg p.o. daily.  He was on this for 3 months. He had been tolerating this fairly well except for hypertension.  Unfortunately scan showed evidence of disease progression with interval development of multiple new pleural-based nodules in the right hemithorax measuring up to 2.9 cm which is concerning for recurrent/metastatic disease.   He was then seen by Dr. Roxan Hockey who offered surgical resection.  Dr. Kendall Flack recommended neoadjuvant treatment with pazopanib for 8 weeks followed by surgical resection if he had improvement in his disease followed by continued  adjuvant treatment for another 6 to 8 months.  Unfortunately, after 2 months of treatment the CT scan showed clear evidence of disease progression with enlarging right pleural-based masses.  He was referred to Dr. 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  Molecular studies by foundation 1 and PD-L1 did not show any actionable mutations and his PD-L1 expression was negative.   The patient then saw Dr. Angelina Ok at [facility] who recommended inpatient systemic chemotherapy with doxorubicin,ifosfamide, and mesna IV every 3 weeks.  He had a 2D echo as well as a Port-A-Cath placed.  This was discontinued due to evidence of disease progression.  Dr. Angelina Ok recommended for the patient treatment with Temodar 200 Mg/M2 on days 1-7 and 15-21 in addition to Avastin 5 Mg/M2 on days 8 and 22 every 4 weeks.  He is here today for day 8 cycle 1. We reviewed side effects of avastin today including hypertension, proteinuria, abnormal bleeding/bruising, and in rare situations, bowel perforation.   Labs were reviewed. His CMP is pending. As  long as this is within parameters, ok to proceed with treatment.As long as his urine protein is <300, ok to proceed with avastin. Recommend that proceed with day 8 cycle 1 as scheduled.   We will see him back for follow-up visit in 2 weeks for day 22 of treatment  He will continue to follow with endocrinology regarding his hypoglycemia.  He is currently taking prednisone daily.  Discussed the importance of blood pressure control and taking his anti-hypertensives prior to his appointment.   The patient was advised to call immediately if he has any concerning symptoms in the interval. The patient voices understanding of current disease status and treatment options and is in agreement with the current care plan. All questions were answered. The patient knows to call the clinic with any problems, questions or concerns. We can certainly see the patient much sooner if necessary   Orders Placed This Encounter  Procedures   CMP ([facility] only)    Standing Status:   Future    Standing Expiration Date:   05/02/2023     The total time spent in the appointment was 20-29 minutes.   Charelle Petrakis L Bram Hottel, PA-C 05/01/22

## 2022-05-01 ENCOUNTER — Inpatient Hospital Stay: Payer: BC Managed Care – PPO

## 2022-05-01 ENCOUNTER — Encounter: Payer: Self-pay | Admitting: Physician Assistant

## 2022-05-01 ENCOUNTER — Inpatient Hospital Stay (HOSPITAL_BASED_OUTPATIENT_CLINIC_OR_DEPARTMENT_OTHER): Payer: BC Managed Care – PPO | Admitting: Physician Assistant

## 2022-05-01 VITALS — BP 143/87 | HR 99 | Temp 97.9°F | Resp 16

## 2022-05-01 DIAGNOSIS — R918 Other nonspecific abnormal finding of lung field: Secondary | ICD-10-CM

## 2022-05-01 DIAGNOSIS — D492 Neoplasm of unspecified behavior of bone, soft tissue, and skin: Secondary | ICD-10-CM

## 2022-05-01 DIAGNOSIS — C3491 Malignant neoplasm of unspecified part of right bronchus or lung: Secondary | ICD-10-CM | POA: Diagnosis not present

## 2022-05-01 DIAGNOSIS — Z95828 Presence of other vascular implants and grafts: Secondary | ICD-10-CM | POA: Insufficient documentation

## 2022-05-01 LAB — CMP (CANCER CENTER ONLY)
ALT: 25 U/L (ref 0–44)
AST: 16 U/L (ref 15–41)
Albumin: 3.8 g/dL (ref 3.5–5.0)
Alkaline Phosphatase: 82 U/L (ref 38–126)
Anion gap: 4 — ABNORMAL LOW (ref 5–15)
BUN: 20 mg/dL (ref 8–23)
CO2: 31 mmol/L (ref 22–32)
Calcium: 9.6 mg/dL (ref 8.9–10.3)
Chloride: 106 mmol/L (ref 98–111)
Creatinine: 0.84 mg/dL (ref 0.61–1.24)
GFR, Estimated: >60 mL/min (ref >60)
Glucose, Bld: 107 mg/dL — ABNORMAL HIGH (ref 70–99)
Potassium: 4.1 mmol/L (ref 3.5–5.1)
Sodium: 141 mmol/L (ref 135–145)
Total Bilirubin: 0.6 mg/dL (ref 0.3–1.2)
Total Protein: 6.2 g/dL — ABNORMAL LOW (ref 6.5–8.1)

## 2022-05-01 LAB — CBC WITH DIFFERENTIAL (CANCER CENTER ONLY)
Abs Immature Granulocytes: 0.36 10*3/uL — ABNORMAL HIGH (ref 0.00–0.07)
Basophils Absolute: 0 10*3/uL (ref 0.0–0.1)
Basophils Relative: 0 %
Eosinophils Absolute: 0 10*3/uL (ref 0.0–0.5)
Eosinophils Relative: 0 %
HCT: 35 % — ABNORMAL LOW (ref 39.0–52.0)
Hemoglobin: 11.1 g/dL — ABNORMAL LOW (ref 13.0–17.0)
Immature Granulocytes: 3 %
Lymphocytes Relative: 3 %
Lymphs Abs: 0.4 10*3/uL — ABNORMAL LOW (ref 0.7–4.0)
MCH: 30.7 pg (ref 26.0–34.0)
MCHC: 31.7 g/dL (ref 30.0–36.0)
MCV: 97 fL (ref 80.0–100.0)
Monocytes Absolute: 0.8 10*3/uL (ref 0.1–1.0)
Monocytes Relative: 7 %
Neutro Abs: 11 10*3/uL — ABNORMAL HIGH (ref 1.7–7.7)
Neutrophils Relative %: 87 %
Platelet Count: 158 10*3/uL (ref 150–400)
RBC: 3.61 MIL/uL — ABNORMAL LOW (ref 4.22–5.81)
RDW: 24.5 % — ABNORMAL HIGH (ref 11.5–15.5)
WBC Count: 12.6 10*3/uL — ABNORMAL HIGH (ref 4.0–10.5)
nRBC: 0.2 % (ref 0.0–0.2)

## 2022-05-01 LAB — TOTAL PROTEIN, URINE DIPSTICK: Protein, ur: 30 mg/dL — AB

## 2022-05-01 MED ORDER — SODIUM CHLORIDE 0.9% FLUSH
10.0000 mL | INTRAVENOUS | Status: DC | PRN
  Administered 2022-05-01: 10 mL

## 2022-05-01 MED ORDER — SODIUM CHLORIDE 0.9 % IV SOLN: Freq: Once | INTRAVENOUS | Status: AC

## 2022-05-01 MED ORDER — HEPARIN SOD (PORK) LOCK FLUSH 100 UNIT/ML IV SOLN
500.0000 [IU] | Freq: Once | INTRAVENOUS | Status: AC | PRN
  Administered 2022-05-01: 500 [IU]

## 2022-05-01 MED ORDER — SODIUM CHLORIDE 0.9% FLUSH
10.0000 mL | Freq: Once | INTRAVENOUS | Status: AC
  Administered 2022-05-01: 10 mL

## 2022-05-01 MED ORDER — SODIUM CHLORIDE 0.9 % IV SOLN
5.0000 mg/kg | Freq: Once | INTRAVENOUS | Status: AC
  Administered 2022-05-01: 400 mg via INTRAVENOUS
  Filled 2022-05-01: qty 16

## 2022-05-01 NOTE — Patient Instructions (Signed)
[facility]  Discharge Instructions: Thank you for choosing [facility] to provide your oncology and hematology care.   If you have a lab appointment with the [facility], please go directly to the [facility] and check in at the registration area.   Wear comfortable clothing and clothing appropriate for easy access to any Portacath or PICC line.   We strive to give you quality time with your provider. You may need to reschedule your appointment if you arrive late (15 or more minutes).  Arriving late affects you and other patients whose appointments are after yours.  Also, if you miss three or more appointments without notifying the office, you may be dismissed from the clinic at the provider's discretion.      For prescription refill requests, have your pharmacy contact our office and allow 72 hours for refills to be completed.    Today you received the following chemotherapy and/or immunotherapy agents: Bevacizumab (Avastin)      To help prevent nausea and vomiting after your treatment, we encourage you to take your nausea medication as directed.  BELOW ARE SYMPTOMS THAT SHOULD BE REPORTED IMMEDIATELY: *FEVER GREATER THAN 100.4 F (38 C) OR HIGHER *CHILLS OR SWEATING *NAUSEA AND VOMITING THAT IS NOT CONTROLLED WITH YOUR NAUSEA MEDICATION *UNUSUAL SHORTNESS OF BREATH *UNUSUAL BRUISING OR BLEEDING *URINARY PROBLEMS (pain or burning when urinating, or frequent urination) *BOWEL PROBLEMS (unusual diarrhea, constipation, pain near the anus) TENDERNESS IN MOUTH AND THROAT WITH OR WITHOUT PRESENCE OF ULCERS (sore throat, sores in mouth, or a toothache) UNUSUAL RASH, SWELLING OR PAIN  UNUSUAL VAGINAL DISCHARGE OR ITCHING   Items with * indicate a potential emergency and should be followed up as soon as possible or go to the Emergency Department if any problems should occur.  Please show the CHEMOTHERAPY ALERT CARD or IMMUNOTHERAPY ALERT CARD at  check-in to the Emergency Department and triage nurse.  Should you have questions after your visit or need to cancel or reschedule your appointment, please contact [facility]  Dept: [phone]  and follow the prompts.  Office hours are 8:00 a.m. to 4:30 p.m. Monday - Friday. Please note that voicemails left after 4:00 p.m. may not be returned until the following business day.  We are closed weekends and major holidays. You have access to a nurse at all times for urgent questions. Please call the main number to the clinic Dept: [phone] and follow the prompts.   For any non-urgent questions, you may also contact your provider using MyChart. We now offer e-Visits for anyone 57 and older to request care online for non-urgent symptoms. For details visit [email or web address].   Also download the MyChart app! Go to the app store, search "MyChart", open the app, select [facility], and log in with your MyChart username and password.  Masks are optional in the cancer centers. If you would like for your care team to wear a mask while they are taking care of you, please let them know. You may have one support person who is at least 63 years old accompany you for your appointments. Bevacizumab Injection What is this medication? BEVACIZUMAB (be va SIZ yoo mab) treats some types of cancer. It works by blocking a protein that causes cancer cells to grow and multiply. This helps to slow or stop the spread of cancer cells. It is a monoclonal antibody. This medicine may be used for other purposes; ask your health care provider or  pharmacist if you have questions. COMMON BRAND NAME(S): Alymsys, Avastin, MVASI, Noah Charon What should I tell my care team before I take this medication? They need to know if you have any of these conditions: Blood clots Coughing up blood Having or recent surgery Heart failure High blood pressure History of a connection between 2 or more body parts  that do not usually connect (fistula) History of a tear in your stomach or intestines Protein in your urine An unusual or allergic reaction to bevacizumab, other medications, foods, dyes, or preservatives Pregnant or trying to get pregnant Breast-feeding How should I use this medication? This medication is injected into a vein. It is given by your care team in a hospital or clinic setting. Talk to your care team the use of this medication in children. Special care may be needed. Overdosage: If you think you have taken too much of this medicine contact a poison control center or emergency room at once. NOTE: This medicine is only for you. Do not share this medicine with others. What if I miss a dose? Keep appointments for follow-up doses. It is important not to miss your dose. Call your care team if you are unable to keep an appointment. What may interact with this medication? Interactions are not expected. This list may not describe all possible interactions. Give your health care provider a list of all the medicines, herbs, non-prescription drugs, or dietary supplements you use. Also tell them if you smoke, drink alcohol, or use illegal drugs. Some items may interact with your medicine. What should I watch for while using this medication? Your condition will be monitored carefully while you are receiving this medication. You may need blood work while taking this medication. This medication may make you feel generally unwell. This is not uncommon as chemotherapy can affect healthy cells as well as cancer cells. Report any side effects. Continue your course of treatment even though you feel ill unless your care team tells you to stop. This medication may increase your risk to bruise or bleed. Call your care team if you notice any unusual bleeding. Before having surgery, talk to your care team to make sure it is ok. This medication can increase the risk of poor healing of your surgical site or  wound. You will need to stop this medication for 28 days before surgery. After surgery, wait at least 28 days before restarting this medication. Make sure the surgical site or wound is healed enough before restarting this medication. Talk to your care team if questions. Talk to your care team if you may be pregnant. Serious birth defects can occur if you take this medication during pregnancy and for 6 months after the last dose. Contraception is recommended while taking this medication and for 6 months after the last dose. Your care team can help you find the option that works for you. Do not breastfeed while taking this medication and for 6 months after the last dose. This medication can cause infertility. Talk to your care team if you are concerned about your fertility. What side effects may I notice from receiving this medication? Side effects that you should report to your care team as soon as possible: Allergic reactions--skin rash, itching, hives, swelling of the face, lips, tongue, or throat Bleeding--bloody or black, tar-like stools, vomiting blood or brown material that looks like coffee grounds, red or dark brown urine, small red or purple spots on skin, unusual bruising or bleeding Blood clot--pain, swelling, or warmth in the leg,  shortness of breath, chest pain Heart attack--pain or tightness in the chest, shoulders, arms, or jaw, nausea, shortness of breath, cold or clammy skin, feeling faint or lightheaded Heart failure--shortness of breath, swelling of the ankles, feet, or hands, sudden weight gain, unusual weakness or fatigue Increase in blood pressure Infection--fever, chills, cough, sore throat, wounds that don't heal, pain or trouble when passing urine, general feeling of discomfort or being unwell Infusion reactions--chest pain, shortness of breath or trouble breathing, feeling faint or lightheaded Kidney injury--decrease in the amount of urine, swelling of the ankles, hands, or  feet Stomach pain that is severe, does not go away, or gets worse Stroke--sudden numbness or weakness of the face, arm, or leg, trouble speaking, confusion, trouble walking, loss of balance or coordination, dizziness, severe headache, change in vision Sudden and severe headache, confusion, change in vision, seizures, which may be signs of posterior reversible encephalopathy syndrome (PRES) Side effects that usually do not require medical attention (report to your care team if they continue or are bothersome): Back pain Change in taste Diarrhea Dry skin Increased tears Nosebleed This list may not describe all possible side effects. Call your doctor for medical advice about side effects. You may report side effects to FDA at 1-800-FDA-1088. Where should I keep my medication? This medication is given in a hospital or clinic. It will not be stored at home. NOTE: This sheet is a summary. It may not cover all possible information. If you have questions about this medicine, talk to your doctor, pharmacist, or health care provider.  2023 Elsevier/Gold Standard (2021-10-04 00:00:00)

## 2022-05-02 ENCOUNTER — Telehealth: Payer: Self-pay

## 2022-05-02 ENCOUNTER — Encounter: Payer: Self-pay | Admitting: Internal Medicine

## 2022-05-02 ENCOUNTER — Encounter: Payer: Self-pay | Admitting: Physician Assistant

## 2022-05-02 NOTE — Telephone Encounter (Signed)
[name] states that he is doing fine. He is eating, drinking, and urinating well.  He knows to call the office at [phone] if he has any questions or concerns.

## 2022-05-02 NOTE — Telephone Encounter (Signed)
-----   Message from Alvera Singh, RN sent at 05/01/2022  4:27 PM EST ----- Regarding: First time First time Avastin/MVASI- 05/01/22 tolerated well. Dr. Julien Nordmann patient.

## 2022-05-05 ENCOUNTER — Telehealth: Payer: Self-pay | Admitting: Internal Medicine

## 2022-05-05 NOTE — Telephone Encounter (Signed)
Called patient regarding all upcoming appointments, patient is notified.

## 2022-05-06 ENCOUNTER — Encounter: Payer: Self-pay | Admitting: Internal Medicine

## 2022-05-06 ENCOUNTER — Other Ambulatory Visit: Payer: Self-pay | Admitting: Internal Medicine

## 2022-05-06 MED ORDER — INTEGRA PLUS PO CAPS: 1.0000 | ORAL_CAPSULE | Freq: Every day | ORAL | 1 refills | Status: DC

## 2022-05-07 ENCOUNTER — Encounter: Payer: Self-pay | Admitting: Medical Oncology

## 2022-05-09 ENCOUNTER — Inpatient Hospital Stay: Payer: BC Managed Care – PPO

## 2022-05-12 ENCOUNTER — Other Ambulatory Visit: Payer: Self-pay

## 2022-05-12 ENCOUNTER — Inpatient Hospital Stay: Payer: BC Managed Care – PPO

## 2022-05-12 DIAGNOSIS — C3491 Malignant neoplasm of unspecified part of right bronchus or lung: Secondary | ICD-10-CM | POA: Diagnosis not present

## 2022-05-12 DIAGNOSIS — Z95828 Presence of other vascular implants and grafts: Secondary | ICD-10-CM

## 2022-05-12 DIAGNOSIS — D492 Neoplasm of unspecified behavior of bone, soft tissue, and skin: Secondary | ICD-10-CM

## 2022-05-12 DIAGNOSIS — D649 Anemia, unspecified: Secondary | ICD-10-CM

## 2022-05-12 DIAGNOSIS — R918 Other nonspecific abnormal finding of lung field: Secondary | ICD-10-CM

## 2022-05-12 LAB — CMP (CANCER CENTER ONLY)
ALT: 34 U/L (ref 0–44)
AST: 18 U/L (ref 15–41)
Albumin: 3.9 g/dL (ref 3.5–5.0)
Alkaline Phosphatase: 79 U/L (ref 38–126)
Anion gap: 3 — ABNORMAL LOW (ref 5–15)
BUN: 19 mg/dL (ref 8–23)
CO2: 31 mmol/L (ref 22–32)
Calcium: 9.5 mg/dL (ref 8.9–10.3)
Chloride: 108 mmol/L (ref 98–111)
Creatinine: 0.96 mg/dL (ref 0.61–1.24)
GFR, Estimated: >60 mL/min (ref >60)
Glucose, Bld: 101 mg/dL — ABNORMAL HIGH (ref 70–99)
Potassium: 4.1 mmol/L (ref 3.5–5.1)
Sodium: 142 mmol/L (ref 135–145)
Total Bilirubin: 0.7 mg/dL (ref 0.3–1.2)
Total Protein: 6 g/dL — ABNORMAL LOW (ref 6.5–8.1)

## 2022-05-12 LAB — CBC WITH DIFFERENTIAL (CANCER CENTER ONLY)
Abs Immature Granulocytes: 0.11 10*3/uL — ABNORMAL HIGH (ref 0.00–0.07)
Basophils Absolute: 0 10*3/uL (ref 0.0–0.1)
Basophils Relative: 0 %
Eosinophils Absolute: 0 10*3/uL (ref 0.0–0.5)
Eosinophils Relative: 0 %
HCT: 38.2 % — ABNORMAL LOW (ref 39.0–52.0)
Hemoglobin: 12.2 g/dL — ABNORMAL LOW (ref 13.0–17.0)
Immature Granulocytes: 1 %
Lymphocytes Relative: 3 %
Lymphs Abs: 0.3 10*3/uL — ABNORMAL LOW (ref 0.7–4.0)
MCH: 32.1 pg (ref 26.0–34.0)
MCHC: 31.9 g/dL (ref 30.0–36.0)
MCV: 100.5 fL — ABNORMAL HIGH (ref 80.0–100.0)
Monocytes Absolute: 0.6 10*3/uL (ref 0.1–1.0)
Monocytes Relative: 5 %
Neutro Abs: 10.6 10*3/uL — ABNORMAL HIGH (ref 1.7–7.7)
Neutrophils Relative %: 91 %
Platelet Count: 134 10*3/uL — ABNORMAL LOW (ref 150–400)
RBC: 3.8 MIL/uL — ABNORMAL LOW (ref 4.22–5.81)
RDW: 23.4 % — ABNORMAL HIGH (ref 11.5–15.5)
WBC Count: 11.6 10*3/uL — ABNORMAL HIGH (ref 4.0–10.5)
nRBC: 0 % (ref 0.0–0.2)

## 2022-05-12 LAB — SAMPLE TO BLOOD BANK

## 2022-05-12 MED ORDER — SODIUM CHLORIDE 0.9% FLUSH
10.0000 mL | Freq: Once | INTRAVENOUS | Status: AC
  Administered 2022-05-12: 10 mL

## 2022-05-12 MED ORDER — HEPARIN SOD (PORK) LOCK FLUSH 100 UNIT/ML IV SOLN
500.0000 [IU] | Freq: Once | INTRAVENOUS | Status: AC
  Administered 2022-05-12: 500 [IU]

## 2022-05-13 NOTE — Progress Notes (Unsigned)
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 Roxan Hockey. 2) Sunitinib 3 7.5 mg p.o. daily.  First dose started April 08, 2021.  Status post 3 months of treatment.  This treatment was discontinued secondary to disease progression. 3) Votrient (pazopanib) 800 mg p.o. daily.  Started August 10, 2021.  Status post 2 months of treatment discontinued secondary to disease progression. 4) status post redo right thoracotomy with resection of pleural tumors and lymph node dissection under the care of Dr. Roxan Hockey on Nov 13, 2021. 5) Systemic chemotherapy with doxorubicin 25 Mg/M2, ifosfamide 2500 Mg/M2 and mesna 2500 Mg/M2 days 1-3 every 3 weeks.  First dose February 12, 2022 at [facility].  Status post 2 cycles of treatment discontinued secondary to disease progression.   CURRENT THERAPY: He is expected to start treatment with systemic chemotherapy with Temodar 200 Mg/M2 on days 1-7 and day 15-21 with a Avastin 5 Mg/KG on days 8 and 22 every 4 weeks.  He is status post day 8 cycle 1  INTERVAL HISTORY: RODDY BELLAMY 61 y.o. male returns to the clinic today for follow-up visit accompanied by his wife.  The patient completed 2 cycles of AIM chemotherapy under the care of Dr. Angelina Ok over at the [facility].  He had restaging imaging after 2 cycles which, unfortunately, showed disease progression.  Therefore, his treatment was discontinued and he is here to try to start day 8 of his new treatment for which he is currently on Temodar and Avastin.  He tolerated his Temodar well without any concerning adverse side effects on day 1 through 5, although he started  having manageable fatigue on days 6 and 7 and days 20 and 21. He tolerated his first cycle of avastin well as well without bleeding or abdominal pain.   He denies any nausea, vomiting, diarrhea, or constipation. Denies any fever, chills, night sweats, or unexplained weight loss.  His dyspnea on exertion is stable. He is having some mild nasal congestion and thinks he may have a mild viral infection. He denies significant cough. He denies any hemoptysis.  He does have some right-sided chest discomfort due to the pleural-based disease. He states this is similar to his baseline. 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 He is currently on prednisone for this. He took his anti-hypertensive as scheduled. Denies any abnormal bleeding or bruising.  He denies abdominal pain. He is here today for evaluation repeat blood work before undergoing day 22 of cycle #1       MEDICAL HISTORY: Past Medical History:  Diagnosis Date   Arthritis    osteo   Asthma    asthma as a child   Clubbing of nails    Deafness in left ear    GERD (gastroesophageal reflux disease)    History of kidney stones    Metastatic sarcoma (HCC)    Pneumonia    Shortness of breath dyspnea    just initially   Vitamin D deficiency     ALLERGIES:  is allergic to aspirin and penicillins.  MEDICATIONS:  Current Outpatient Medications  Medication Sig Dispense Refill   Accu-Chek  Softclix Lancets lancets SMARTSIG:Topical     amLODipine (NORVASC) 10 MG tablet Take 20 mg by mouth daily.     ascorbic acid (VITAMIN C) 500 MG tablet Take 500 mg by mouth daily.     Cholecalciferol (VITAMIN D-3) 125 MCG (5000 UT) TABS Take 1 tablet by mouth daily. (Patient not taking: Reported on 05/01/2022)     dexamethasone (DECADRON) 4 MG tablet Take 8 mg by mouth as directed. TAKE 2 TABLETS BY MOUTH IN THE MORNING FOR 2 DAYS FOLLOWING CHEMOTHERAPY     FeFum-FePoly-FA-B Cmp-C-Biot (INTEGRA PLUS)  CAPS Take 1 capsule by mouth daily. 90 capsule 1   Multiple Vitamins-Minerals (ZINC PO) Take 1 tablet by mouth daily. (Patient not taking: Reported on 05/01/2022)     OLANZapine (ZYPREXA) 5 MG tablet Take 5 mg by mouth as directed. TAKE 1 (5 MG) TABLET BY MOUTH AT NIGHT FOR 2 NIGHTS FOLLOWING CHEMOTHERAPY     Potassium Chloride ER 20 MEQ TBCR Take 1 tablet by mouth daily.     predniSONE (DELTASONE) 10 MG tablet Take 10 mg by mouth daily with breakfast. 10 mg in am and 20 mg in pm  "Takes for low Blood glucose"     prochlorperazine (COMPAZINE) 10 MG tablet Take 10 mg by mouth every 6 (six) hours as needed for vomiting or nausea.     sulfamethoxazole-trimethoprim (BACTRIM) 400-80 MG tablet Take 1 tablet by mouth daily. States does is 80 mg /daily     temozolomide (TEMODAR) 5 MG capsule Take 100 mg by mouth daily. Take 3 capsules  hs on days 1-7 and 15-21.  Off on days 8-14 and days 22-28. May take on an empty stomach or at bedtime to decrease nausea/vomiting.     No current facility-administered medications for this visit.    SURGICAL HISTORY:  Past Surgical History:  Procedure Laterality Date   INTERCOSTAL NERVE BLOCK Right 04/27/2020   Procedure: INTERCOSTAL NERVE BLOCK;  Surgeon: [name], MD;  Location: [facility];  Service: Thoracic;  Laterality: Right;   KNEE CARTILAGE SURGERY Left 1976   pleural tumor Right 01/22/2016   resection of solitary fibrous tumor   RESECTION OF MEDIASTINAL MASS Right 01/23/2016   this is in error- no mediastinal tumor   RESECTION OF MEDIASTINAL MASS Right 11/13/2021   Procedure: RESECTION OF PLEURAL TUMORS;  Surgeon: [name], MD;  Location: [facility];  Service: Thoracic;  Laterality: Right;   THORACOTOMY Right 04/27/2020   Procedure: THORACOTOMY;  Surgeon: [name], MD;  Location: [facility];  Service: Thoracic;  Laterality: Right;   THORACOTOMY Right 11/13/2021   Procedure: REDO THORACOTOMY;  Surgeon: [name], MD;   Location: [facility];  Service: Thoracic;  Laterality: Right;   TONSILLECTOMY     VIDEO BRONCHOSCOPY WITH ENDOBRONCHIAL ULTRASOUND N/A 12/17/2015   Procedure: VIDEO BRONCHOSCOPY WITH ENDOBRONCHIAL ULTRASOUND;  Surgeon: [name], MD;  Location: [facility];  Service: Thoracic;  Laterality: N/A;    REVIEW OF SYSTEMS:   Review of Systems  Constitutional: Negative for appetite change, chills, fatigue, fever and unexpected weight change.  HENT:   Negative for mouth sores, nosebleeds, sore throat and trouble swallowing.   Eyes: Negative for eye problems and icterus.  Respiratory: Negative for cough, hemoptysis, shortness of breath and wheezing.   Cardiovascular: Negative for chest pain and leg swelling.  Gastrointestinal: Negative for abdominal pain, constipation, diarrhea, nausea and vomiting.  Genitourinary: Negative for bladder incontinence, difficulty urinating, dysuria, frequency and hematuria.   Musculoskeletal: Negative  for back pain, gait problem, neck pain and neck stiffness.  Skin: Negative for itching and rash.  Neurological: Negative for dizziness, extremity weakness, gait problem, headaches, light-headedness and seizures.  Hematological: Negative for adenopathy. Does not bruise/bleed easily.  Psychiatric/Behavioral: Negative for confusion, depression and sleep disturbance. The patient is not nervous/anxious.     PHYSICAL EXAMINATION:  There were no vitals taken for this visit.  ECOG PERFORMANCE STATUS: {CHL ONC ECOG [id number]  Physical Exam  Constitutional: Oriented to person, place, and time and well-developed, well-nourished, and in no distress. No distress.  HENT:  Head: Normocephalic and atraumatic.  Mouth/Throat: Oropharynx is clear and moist. No oropharyngeal exudate.  Eyes: Conjunctivae are normal. Right eye exhibits no discharge. Left eye exhibits no discharge. No scleral icterus.  Neck: Normal range of motion. Neck supple.  Cardiovascular: Normal rate, regular  rhythm, normal heart sounds and intact distal pulses.   Pulmonary/Chest: Effort normal and breath sounds normal. No respiratory distress. No wheezes. No rales.  Abdominal: Soft. Bowel sounds are normal. Exhibits no distension and no mass. There is no tenderness.  Musculoskeletal: Normal range of motion. Exhibits no edema.  Lymphadenopathy:    No cervical adenopathy.  Neurological: Alert and oriented to person, place, and time. Exhibits normal muscle tone. Gait normal. Coordination normal.  Skin: Skin is warm and dry. No rash noted. Not diaphoretic. No erythema. No pallor.  Psychiatric: Mood, memory and judgment normal.  Vitals reviewed.  LABORATORY DATA: Lab Results  Component Value Date   WBC 11.6 (H) 05/12/2022   HGB 12.2 (L) 05/12/2022   HCT 38.2 (L) 05/12/2022   MCV 100.5 (H) 05/12/2022   PLT 134 (L) 05/12/2022      Chemistry      Component Value Date/Time   NA 142 05/12/2022 1457   K 4.1 05/12/2022 1457   CL 108 05/12/2022 1457   CO2 31 05/12/2022 1457   BUN 19 05/12/2022 1457   CREATININE 0.96 05/12/2022 1457      Component Value Date/Time   CALCIUM 9.5 05/12/2022 1457   ALKPHOS 79 05/12/2022 1457   AST 18 05/12/2022 1457   ALT 34 05/12/2022 1457   BILITOT 0.7 05/12/2022 1457       RADIOGRAPHIC STUDIES:  No results found.   ASSESSMENT/PLAN:  This is a very pleasant 61 year old male with recurrent solitary fibrous tumor of the right lung.  This was initially diagnosed in June 2017.   He is status post resection.  He had recurrence in October 2021 and underwent re-resection under the care of Dr. Roxan Hockey.  He had been on observation as there is no role for adjuvant therapy for his condition.  He was seen by Dr. Kendall Flack at [facility] who recommended observation and consideration of treatment with Sunitinib followed by surgery if the patient had disease recurrence.   The patient had a new 1.4 cm nodule posteriorly and the patient underwent treatment  with sunitinib 37.5 mg p.o. daily.  He was on this for 3 months. He had been tolerating this fairly well except for hypertension.  Unfortunately scan showed evidence of disease progression with interval development of multiple new pleural-based nodules in the right hemithorax measuring up to 2.9 cm which is concerning for recurrent/metastatic disease.   He was then seen by Dr. Roxan Hockey who offered surgical resection.  Dr. Kendall Flack recommended neoadjuvant treatment with pazopanib for 8 weeks followed by surgical resection if he had improvement in his disease followed by continued adjuvant treatment for another 6 to 8  months.  Unfortunately, after 2 months of treatment the CT scan showed clear evidence of disease progression with enlarging right pleural-based masses.  He was referred to Dr. 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  Molecular studies by foundation 1 and PD-L1 did not show any actionable mutations and his PD-L1 expression was negative.   The patient then saw Dr. Angelina Ok at [facility] who recommended inpatient systemic chemotherapy with doxorubicin,ifosfamide, and mesna IV every 3 weeks.  He had a 2D echo as well as a Port-A-Cath placed.  This was discontinued due to evidence of disease progression.   Dr. Angelina Ok recommended for the patient treatment with Temodar 200 Mg/M2 on days 1-7 and 15-21 in addition to Avastin 5 Mg/M2 on days 8 and 22 every 4 weeks.  He is here today for day 22 cycle 1. We reviewed side effects of avastin today including hypertension, proteinuria, abnormal bleeding/bruising, and in rare situations, bowel perforation.    Labs were reviewed. His CMP is pending. As long as this is within parameters, ok to proceed with treatment.As  long as his urine protein is <300, ok to proceed with avastin.*** Recommend that proceed with day 22 cycle 1 as scheduled.    We will see him back for follow-up visit in 2 weeks for day 1 cycle #2 of treatment   He will continue to follow with endocrinology regarding his hypoglycemia.  He is currently taking prednisone daily.  The patient was advised to call immediately if he has any concerning symptoms in the interval. The patient voices understanding of current disease status and treatment options and is in agreement with the current care plan. All questions were answered. The patient knows to call the clinic with any problems, questions or concerns. We can certainly see the patient much sooner if necessary          No orders of the defined types were placed in this encounter.    I spent {CHL ONC TIME VISIT - AYOKH:[id number]} counseling the patient face to face. The total time spent in the appointment was {CHL ONC TIME VISIT - RVUYE:[id number]}.  Dalia Jollie L Jaydn Moscato, PA-C 05/13/22

## 2022-05-15 ENCOUNTER — Other Ambulatory Visit: Payer: BC Managed Care – PPO

## 2022-05-15 ENCOUNTER — Inpatient Hospital Stay: Payer: BC Managed Care – PPO

## 2022-05-15 ENCOUNTER — Ambulatory Visit: Payer: BC Managed Care – PPO | Admitting: Adult Health

## 2022-05-15 ENCOUNTER — Ambulatory Visit: Payer: BC Managed Care – PPO

## 2022-05-15 ENCOUNTER — Inpatient Hospital Stay (HOSPITAL_BASED_OUTPATIENT_CLINIC_OR_DEPARTMENT_OTHER): Payer: BC Managed Care – PPO | Admitting: Physician Assistant

## 2022-05-15 VITALS — BP 152/87 | HR 89 | Temp 98.2°F | Resp 20

## 2022-05-15 DIAGNOSIS — D492 Neoplasm of unspecified behavior of bone, soft tissue, and skin: Secondary | ICD-10-CM | POA: Diagnosis not present

## 2022-05-15 DIAGNOSIS — Z95828 Presence of other vascular implants and grafts: Secondary | ICD-10-CM

## 2022-05-15 DIAGNOSIS — Z5111 Encounter for antineoplastic chemotherapy: Secondary | ICD-10-CM

## 2022-05-15 DIAGNOSIS — C3491 Malignant neoplasm of unspecified part of right bronchus or lung: Secondary | ICD-10-CM | POA: Diagnosis not present

## 2022-05-15 DIAGNOSIS — Z7689 Persons encountering health services in other specified circumstances: Secondary | ICD-10-CM

## 2022-05-15 DIAGNOSIS — R918 Other nonspecific abnormal finding of lung field: Secondary | ICD-10-CM

## 2022-05-15 LAB — CBC WITH DIFFERENTIAL (CANCER CENTER ONLY)
Abs Immature Granulocytes: 0.11 10*3/uL — ABNORMAL HIGH (ref 0.00–0.07)
Basophils Absolute: 0 10*3/uL (ref 0.0–0.1)
Basophils Relative: 0 %
Eosinophils Absolute: 0 10*3/uL (ref 0.0–0.5)
Eosinophils Relative: 0 %
HCT: 37.6 % — ABNORMAL LOW (ref 39.0–52.0)
Hemoglobin: 12 g/dL — ABNORMAL LOW (ref 13.0–17.0)
Immature Granulocytes: 1 %
Lymphocytes Relative: 4 %
Lymphs Abs: 0.3 10*3/uL — ABNORMAL LOW (ref 0.7–4.0)
MCH: 32.1 pg (ref 26.0–34.0)
MCHC: 31.9 g/dL (ref 30.0–36.0)
MCV: 100.5 fL — ABNORMAL HIGH (ref 80.0–100.0)
Monocytes Absolute: 0.4 10*3/uL (ref 0.1–1.0)
Monocytes Relative: 5 %
Neutro Abs: 7.8 10*3/uL — ABNORMAL HIGH (ref 1.7–7.7)
Neutrophils Relative %: 90 %
Platelet Count: 122 10*3/uL — ABNORMAL LOW (ref 150–400)
RBC: 3.74 MIL/uL — ABNORMAL LOW (ref 4.22–5.81)
RDW: 22.5 % — ABNORMAL HIGH (ref 11.5–15.5)
WBC Count: 8.6 10*3/uL (ref 4.0–10.5)
nRBC: 0 % (ref 0.0–0.2)

## 2022-05-15 LAB — CMP (CANCER CENTER ONLY)
ALT: 38 U/L (ref 0–44)
AST: 26 U/L (ref 15–41)
Albumin: 3.8 g/dL (ref 3.5–5.0)
Alkaline Phosphatase: 76 U/L (ref 38–126)
Anion gap: 3 — ABNORMAL LOW (ref 5–15)
BUN: 17 mg/dL (ref 8–23)
CO2: 31 mmol/L (ref 22–32)
Calcium: 9.3 mg/dL (ref 8.9–10.3)
Chloride: 108 mmol/L (ref 98–111)
Creatinine: 0.74 mg/dL (ref 0.61–1.24)
GFR, Estimated: >60 mL/min (ref >60)
Glucose, Bld: 84 mg/dL (ref 70–99)
Potassium: 3.7 mmol/L (ref 3.5–5.1)
Sodium: 142 mmol/L (ref 135–145)
Total Bilirubin: 0.8 mg/dL (ref 0.3–1.2)
Total Protein: 6.1 g/dL — ABNORMAL LOW (ref 6.5–8.1)

## 2022-05-15 LAB — TOTAL PROTEIN, URINE DIPSTICK: Protein, ur: 30 mg/dL — AB

## 2022-05-15 MED ORDER — SODIUM CHLORIDE 0.9 % IV SOLN
5.0000 mg/kg | Freq: Once | INTRAVENOUS | Status: AC
  Administered 2022-05-15: 400 mg via INTRAVENOUS
  Filled 2022-05-15: qty 16

## 2022-05-15 MED ORDER — HEPARIN SOD (PORK) LOCK FLUSH 100 UNIT/ML IV SOLN
500.0000 [IU] | Freq: Once | INTRAVENOUS | Status: AC | PRN
  Administered 2022-05-15: 500 [IU]

## 2022-05-15 MED ORDER — SODIUM CHLORIDE 0.9% FLUSH
10.0000 mL | INTRAVENOUS | Status: DC | PRN
  Administered 2022-05-15: 10 mL

## 2022-05-15 MED ORDER — SODIUM CHLORIDE 0.9% FLUSH
10.0000 mL | Freq: Once | INTRAVENOUS | Status: AC
  Administered 2022-05-15: 10 mL

## 2022-05-15 MED ORDER — SODIUM CHLORIDE 0.9 % IV SOLN: Freq: Once | INTRAVENOUS | Status: AC

## 2022-05-15 NOTE — Patient Instructions (Signed)
[facility]  Discharge Instructions: Thank you for choosing [facility] to provide your oncology and hematology care.   If you have a lab appointment with the [facility], please go directly to the [facility] and check in at the registration area.   Wear comfortable clothing and clothing appropriate for easy access to any Portacath or PICC line.   We strive to give you quality time with your provider. You may need to reschedule your appointment if you arrive late (15 or more minutes).  Arriving late affects you and other patients whose appointments are after yours.  Also, if you miss three or more appointments without notifying the office, you may be dismissed from the clinic at the provider's discretion.      For prescription refill requests, have your pharmacy contact our office and allow 72 hours for refills to be completed.    Today you received the following chemotherapy and/or immunotherapy agent: Bevacizumab (MVASI)    To help prevent nausea and vomiting after your treatment, we encourage you to take your nausea medication as directed.  BELOW ARE SYMPTOMS THAT SHOULD BE REPORTED IMMEDIATELY: *FEVER GREATER THAN 100.4 F (38 C) OR HIGHER *CHILLS OR SWEATING *NAUSEA AND VOMITING THAT IS NOT CONTROLLED WITH YOUR NAUSEA MEDICATION *UNUSUAL SHORTNESS OF BREATH *UNUSUAL BRUISING OR BLEEDING *URINARY PROBLEMS (pain or burning when urinating, or frequent urination) *BOWEL PROBLEMS (unusual diarrhea, constipation, pain near the anus) TENDERNESS IN MOUTH AND THROAT WITH OR WITHOUT PRESENCE OF ULCERS (sore throat, sores in mouth, or a toothache) UNUSUAL RASH, SWELLING OR PAIN  UNUSUAL VAGINAL DISCHARGE OR ITCHING   Items with * indicate a potential emergency and should be followed up as soon as possible or go to the Emergency Department if any problems should occur.  Please show the CHEMOTHERAPY ALERT CARD or IMMUNOTHERAPY ALERT CARD at  check-in to the Emergency Department and triage nurse.  Should you have questions after your visit or need to cancel or reschedule your appointment, please contact [facility]  Dept: [phone]  and follow the prompts.  Office hours are 8:00 a.m. to 4:30 p.m. Monday - Friday. Please note that voicemails left after 4:00 p.m. may not be returned until the following business day.  We are closed weekends and major holidays. You have access to a nurse at all times for urgent questions. Please call the main number to the clinic Dept: [phone] and follow the prompts.   For any non-urgent questions, you may also contact your provider using MyChart. We now offer e-Visits for anyone 55 and older to request care online for non-urgent symptoms. For details visit [email or web address].   Also download the MyChart app! Go to the app store, search "MyChart", open the app, select [facility], and log in with your MyChart username and password.  Masks are optional in the cancer centers. If you would like for your care team to wear a mask while they are taking care of you, please let them know. You may have one support person who is at least 63 years old accompany you for your appointments. Bevacizumab Injection What is this medication? BEVACIZUMAB (be va SIZ yoo mab) treats some types of cancer. It works by blocking a protein that causes cancer cells to grow and multiply. This helps to slow or stop the spread of cancer cells. It is a monoclonal antibody. This medicine may be used for other purposes; ask your health care provider or pharmacist if  you have questions. COMMON BRAND NAME(S): Alymsys, Avastin, MVASI, Noah Charon What should I tell my care team before I take this medication? They need to know if you have any of these conditions: Blood clots Coughing up blood Having or recent surgery Heart failure High blood pressure History of a connection between 2 or more body parts  that do not usually connect (fistula) History of a tear in your stomach or intestines Protein in your urine An unusual or allergic reaction to bevacizumab, other medications, foods, dyes, or preservatives Pregnant or trying to get pregnant Breast-feeding How should I use this medication? This medication is injected into a vein. It is given by your care team in a hospital or clinic setting. Talk to your care team the use of this medication in children. Special care may be needed. Overdosage: If you think you have taken too much of this medicine contact a poison control center or emergency room at once. NOTE: This medicine is only for you. Do not share this medicine with others. What if I miss a dose? Keep appointments for follow-up doses. It is important not to miss your dose. Call your care team if you are unable to keep an appointment. What may interact with this medication? Interactions are not expected. This list may not describe all possible interactions. Give your health care provider a list of all the medicines, herbs, non-prescription drugs, or dietary supplements you use. Also tell them if you smoke, drink alcohol, or use illegal drugs. Some items may interact with your medicine. What should I watch for while using this medication? Your condition will be monitored carefully while you are receiving this medication. You may need blood work while taking this medication. This medication may make you feel generally unwell. This is not uncommon as chemotherapy can affect healthy cells as well as cancer cells. Report any side effects. Continue your course of treatment even though you feel ill unless your care team tells you to stop. This medication may increase your risk to bruise or bleed. Call your care team if you notice any unusual bleeding. Before having surgery, talk to your care team to make sure it is ok. This medication can increase the risk of poor healing of your surgical site or  wound. You will need to stop this medication for 28 days before surgery. After surgery, wait at least 28 days before restarting this medication. Make sure the surgical site or wound is healed enough before restarting this medication. Talk to your care team if questions. Talk to your care team if you may be pregnant. Serious birth defects can occur if you take this medication during pregnancy and for 6 months after the last dose. Contraception is recommended while taking this medication and for 6 months after the last dose. Your care team can help you find the option that works for you. Do not breastfeed while taking this medication and for 6 months after the last dose. This medication can cause infertility. Talk to your care team if you are concerned about your fertility. What side effects may I notice from receiving this medication? Side effects that you should report to your care team as soon as possible: Allergic reactions--skin rash, itching, hives, swelling of the face, lips, tongue, or throat Bleeding--bloody or black, tar-like stools, vomiting blood or brown material that looks like coffee grounds, red or dark brown urine, small red or purple spots on skin, unusual bruising or bleeding Blood clot--pain, swelling, or warmth in the leg, shortness of  breath, chest pain Heart attack--pain or tightness in the chest, shoulders, arms, or jaw, nausea, shortness of breath, cold or clammy skin, feeling faint or lightheaded Heart failure--shortness of breath, swelling of the ankles, feet, or hands, sudden weight gain, unusual weakness or fatigue Increase in blood pressure Infection--fever, chills, cough, sore throat, wounds that don't heal, pain or trouble when passing urine, general feeling of discomfort or being unwell Infusion reactions--chest pain, shortness of breath or trouble breathing, feeling faint or lightheaded Kidney injury--decrease in the amount of urine, swelling of the ankles, hands, or  feet Stomach pain that is severe, does not go away, or gets worse Stroke--sudden numbness or weakness of the face, arm, or leg, trouble speaking, confusion, trouble walking, loss of balance or coordination, dizziness, severe headache, change in vision Sudden and severe headache, confusion, change in vision, seizures, which may be signs of posterior reversible encephalopathy syndrome (PRES) Side effects that usually do not require medical attention (report to your care team if they continue or are bothersome): Back pain Change in taste Diarrhea Dry skin Increased tears Nosebleed This list may not describe all possible side effects. Call your doctor for medical advice about side effects. You may report side effects to FDA at 1-800-FDA-1088. Where should I keep my medication? This medication is given in a hospital or clinic. It will not be stored at home. NOTE: This sheet is a summary. It may not cover all possible information. If you have questions about this medicine, talk to your doctor, pharmacist, or health care provider.  2023 Elsevier/Gold Standard (2021-10-04 00:00:00)

## 2022-05-16 ENCOUNTER — Encounter (INDEPENDENT_AMBULATORY_CARE_PROVIDER_SITE_OTHER): Payer: Self-pay

## 2022-05-19 ENCOUNTER — Encounter: Payer: Self-pay | Admitting: Internal Medicine

## 2022-05-22 ENCOUNTER — Inpatient Hospital Stay: Payer: BC Managed Care – PPO | Admitting: Internal Medicine

## 2022-05-22 ENCOUNTER — Inpatient Hospital Stay: Payer: BC Managed Care – PPO

## 2022-05-22 ENCOUNTER — Other Ambulatory Visit: Payer: BC Managed Care – PPO

## 2022-05-28 NOTE — Progress Notes (Unsigned)
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 Roxan [name]. 2) Sunitinib 3 7.5 mg p.o. daily.  First dose started April 08, 2021.  Status post 3 months of treatment.  This treatment was discontinued secondary to disease progression. 3) Votrient (pazopanib) 800 mg p.o. daily.  Started August 10, 2021.  Status post 2 months of treatment discontinued secondary to disease progression. 4) status post redo right thoracotomy with resection of pleural tumors and lymph node dissection under the care of Dr. Roxan [name] on Nov 13, 2021. 5) Systemic chemotherapy with doxorubicin 25 Mg/M2, ifosfamide 2500 Mg/M2 and mesna 2500 Mg/M2 days 1-3 every 3 weeks.  First dose February 12, 2022 at [facility].  Status post 2 cycles of treatment discontinued secondary to disease progression.   CURRENT THERAPY: Systemic chemotherapy with Temodar 200 Mg/M2 on days 1-7 and day 15-21 with a Avastin 5 Mg/KG on days 8 and 22 every 4 weeks.  He is status post day 22 cycle 1   INTERVAL HISTORY: Mathew [name] 61 y.o. male returns to the clinic today for a follow-up visit accompanied by his wife. The patient completed 2 cycles of AIM chemotherapy under the care of Dr. Angelina [name] over at the [facility]. He had restaging imaging after 2 cycles which, unfortunately, showed disease progression.  Therefore, his treatment was discontinued and he is here to try to start day 8 of his new treatment for which he is currently on Temodar and Avastin.  He tolerated his Temodar well without any concerning adverse side effects except for some fatigue.   He tolerated his first cycle of avastin well as  well without bleeding or abdominal pain.    The patient is expected to have a second opinion at [facility].  He is also scheduled to see Dr. Angelina [name] on 06/12/2022.  He states he is going to receive day 22 cycle 2 at [facility] and have a repeat CT scan that day.  In the interval since last being seen, the patient reports he has had a little bit of a cold but over the last 3 to 4 days he has some more chest congestion than he did previously with some coughing which produces grayish-brown phlegm.  He also reports some more dyspnea when he exerts himself.  He denies any fever or chills.  He denies any known sick contacts although he is still working with clients and states he could have had an exposure.  He is allergic to penicillin containing antibiotics.  He denies any nausea, vomiting, diarrhea, or constipation. Denies any night sweats or unexplained weight loss. He denies any hemoptysis.  He does have some right-sided chest discomfort due to the pleural-based disease. He states this is similar to his baseline. 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 He is currently on prednisone for this.   The patient has hypertension and took his blood pressure medication around 645 this morning.    MEDICAL HISTORY: Past Medical History:  Diagnosis Date   Arthritis    osteo   Asthma    asthma as a child   Clubbing of nails  Deafness in left ear    GERD (gastroesophageal reflux disease)    History of kidney stones    Metastatic sarcoma (HCC)    Pneumonia    Shortness of breath dyspnea    just initially   Vitamin D deficiency     ALLERGIES:  is allergic to aspirin and penicillins.  MEDICATIONS:  Current Outpatient Medications  Medication Sig Dispense Refill   furosemide (LASIX) 20 MG tablet Take 1 tablet (20 mg total) by mouth daily as needed. 10 tablet 0   levofloxacin (LEVAQUIN) 750 MG tablet Take 1 tablet (750 mg total) by  mouth daily. 7 tablet 0   Accu-Chek Softclix Lancets lancets SMARTSIG:Topical     amLODipine (NORVASC) 10 MG tablet Take 20 mg by mouth daily.     ascorbic acid (VITAMIN C) 500 MG tablet Take 500 mg by mouth daily.     Cholecalciferol (VITAMIN D-3) 125 MCG (5000 UT) TABS Take 1 tablet by mouth daily. (Patient not taking: Reported on 05/01/2022)     dexamethasone (DECADRON) 4 MG tablet Take 8 mg by mouth as directed. TAKE 2 TABLETS BY MOUTH IN THE MORNING FOR 2 DAYS FOLLOWING CHEMOTHERAPY     FeFum-FePoly-FA-B Cmp-C-Biot (INTEGRA PLUS) CAPS Take 1 capsule by mouth daily. 90 capsule 1   Multiple Vitamins-Minerals (ZINC PO) Take 1 tablet by mouth daily. (Patient not taking: Reported on 05/01/2022)     OLANZapine (ZYPREXA) 5 MG tablet Take 5 mg by mouth as directed. TAKE 1 (5 MG) TABLET BY MOUTH AT NIGHT FOR 2 NIGHTS FOLLOWING CHEMOTHERAPY     Potassium Chloride ER 20 MEQ TBCR Take 1 tablet by mouth daily.     predniSONE (DELTASONE) 10 MG tablet Take 10 mg by mouth daily with breakfast. 10 mg in am and 20 mg in pm  "Takes for low Blood glucose"     prochlorperazine (COMPAZINE) 10 MG tablet Take 10 mg by mouth every 6 (six) hours as needed for vomiting or nausea.     sulfamethoxazole-trimethoprim (BACTRIM) 400-80 MG tablet Take 1 tablet by mouth daily. States does is 80 mg /daily     temozolomide (TEMODAR) 5 MG capsule Take 100 mg by mouth daily. Take 3 capsules  hs on days 1-7 and 15-21.  Off on days 8-14 and days 22-28. May take on an empty stomach or at bedtime to decrease nausea/vomiting.     No current facility-administered medications for this visit.   Facility-Administered Medications Ordered in Other Visits  Medication Dose Route Frequency Provider Last Rate Last Admin   bevacizumab-awwb (MVASI) 400 mg in sodium chloride 0.9 % 100 mL chemo infusion  5 mg/kg (Treatment Plan Recorded) Intravenous Once Curt Bears, MD       heparin lock flush 100 unit/mL  500 Units Intracatheter Once PRN  Curt Bears, MD       sodium chloride flush (NS) 0.9 % injection 10 mL  10 mL Intracatheter PRN Curt Bears, MD        SURGICAL HISTORY:  Past Surgical History:  Procedure Laterality Date   INTERCOSTAL NERVE BLOCK Right 04/27/2020   Procedure: INTERCOSTAL NERVE BLOCK;  Surgeon: [name], MD;  Location: [facility];  Service: Thoracic;  Laterality: Right;   KNEE CARTILAGE SURGERY Left 1976   pleural tumor Right 01/22/2016   resection of solitary fibrous tumor   RESECTION OF MEDIASTINAL MASS Right 01/23/2016   this is in error- no mediastinal tumor   RESECTION OF MEDIASTINAL MASS Right 11/13/2021   Procedure: RESECTION OF  PLEURAL TUMORS;  Surgeon: [name], MD;  Location: [facility];  Service: Thoracic;  Laterality: Right;   THORACOTOMY Right 04/27/2020   Procedure: THORACOTOMY;  Surgeon: [name], MD;  Location: [facility];  Service: Thoracic;  Laterality: Right;   THORACOTOMY Right 11/13/2021   Procedure: REDO THORACOTOMY;  Surgeon: [name], MD;  Location: [facility];  Service: Thoracic;  Laterality: Right;   TONSILLECTOMY     VIDEO BRONCHOSCOPY WITH ENDOBRONCHIAL ULTRASOUND N/A 12/17/2015   Procedure: VIDEO BRONCHOSCOPY WITH ENDOBRONCHIAL ULTRASOUND;  Surgeon: [name], MD;  Location: [facility];  Service: Thoracic;  Laterality: N/A;    REVIEW OF SYSTEMS:   Constitutional: Positive for stable fatigue. Negative for appetite change, chills, fever and unexpected weight change.  HENT: Today for nasal congestion.  Negative for mouth sores, nosebleeds, sore throat and trouble swallowing.   Eyes: Negative for eye problems and icterus.  Respiratory: Positive for worsening cough and dyspnea on exertion.  Negative for hemoptysis and wheezing.   Cardiovascular: Positive for stable lower extremity swelling bilaterally and right-sided chest discomfort. Gastrointestinal: Negative for abdominal pain, constipation, diarrhea, nausea and vomiting.   Genitourinary: Negative for bladder incontinence, difficulty urinating, dysuria, frequency and hematuria.   Musculoskeletal: Negative for back pain, gait problem, neck pain and neck stiffness.  Skin: Negative for itching and rash.  Neurological: Negative for dizziness, extremity weakness, gait problem, headaches, light-headedness and seizures.  Hematological: Negative for adenopathy. Does not bruise/bleed easily.  Psychiatric/Behavioral: Negative for confusion, depression and sleep disturbance. The patient is not nervous/anxious.    PHYSICAL EXAMINATION:  Blood pressure (!) 169/90, pulse (!) 102, temperature 98.3 F (36.8 C), temperature source Oral, resp. rate (!) 24, weight 193 lb 4.8 oz (87.7 kg), SpO2 99 %.  ECOG PERFORMANCE STATUS: 1  Physical Exam  Constitutional: Oriented to person, place, and time and well-developed, well-nourished, and in no distress.  HENT:  Head: Normocephalic and atraumatic.  Mouth/Throat: Oropharynx is clear and moist. No oropharyngeal exudate.  Eyes: Conjunctivae are normal. Right eye exhibits no discharge. Left eye exhibits no discharge. No scleral icterus.  Neck: Normal range of motion. Neck supple.  Cardiovascular: Normal rate, regular rhythm, normal heart sounds and intact distal pulses.   Pulmonary/Chest: Effort normal.  Quiet breath sounds in the right lung.  No respiratory distress. No wheezes. No rales.  Abdominal: Soft. Bowel sounds are normal. Exhibits no distension and no mass. There is no tenderness.  Musculoskeletal: Normal range of motion. Bilateral lower extremity swelling/pitting edema.  Lymphadenopathy:    No cervical adenopathy.  Neurological: Alert and oriented to person, place, and time. Exhibits normal muscle tone. Gait normal. Coordination normal.  Skin: Skin is warm and dry. No rash noted. Not diaphoretic. No erythema. No pallor.  Psychiatric: Mood, memory and judgment normal.  Vitals reviewed.  LABORATORY DATA: Lab Results   Component Value Date   WBC 11.5 (H) 05/29/2022   HGB 13.2 05/29/2022   HCT 40.8 05/29/2022   MCV 102.8 (H) 05/29/2022   PLT 139 (L) 05/29/2022      Chemistry      Component Value Date/Time   NA 142 05/29/2022 1047   K 3.8 05/29/2022 1047   CL 106 05/29/2022 1047   CO2 31 05/29/2022 1047   BUN 15 05/29/2022 1047   CREATININE 0.81 05/29/2022 1047      Component Value Date/Time   CALCIUM 9.2 05/29/2022 1047   ALKPHOS 90 05/29/2022 1047   AST 33 05/29/2022 1047   ALT 47 (H)  05/29/2022 1047   BILITOT 0.9 05/29/2022 1047       RADIOGRAPHIC STUDIES:  No results found.   ASSESSMENT/PLAN:  This is a very pleasant 61 year old male with recurrent solitary fibrous tumor of the right lung.  This was initially diagnosed in June 2017.   He is status post resection.  He had recurrence in October 2021 and underwent re-resection under the care of Dr. Roxan [name].  He had been on observation as there is no role for adjuvant therapy for his condition.  He was seen by Dr. Kendall Flack at [facility] who recommended observation and consideration of treatment with Sunitinib followed by surgery if the patient had disease recurrence.   The patient had a new 1.4 cm nodule posteriorly and the patient underwent treatment with sunitinib 37.5 mg p.o. daily.  He was on this for 3 months. He had been tolerating this fairly well except for hypertension.  Unfortunately scan showed evidence of disease progression with interval development of multiple new pleural-based nodules in the right hemithorax measuring up to 2.9 cm which is concerning for recurrent/metastatic disease.   He was then seen by Dr. Roxan [name] who offered surgical resection.  Dr. Kendall Flack recommended neoadjuvant treatment with pazopanib for 8 weeks followed by surgical resection if he had improvement in his disease followed by continued adjuvant treatment for another 6 to 8 months.  Unfortunately, after 2 months of treatment the CT scan  showed clear evidence of disease progression with enlarging right pleural-based masses.  He was referred to Dr. 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  Molecular studies by foundation 1 and PD-L1 did not show any actionable mutations and his PD-L1 expression was negative.   The patient then saw Dr. Angelina [name] at [facility] who recommended inpatient systemic chemotherapy with doxorubicin,ifosfamide, and mesna IV every 3 weeks.  He had a 2D echo as well as a Port-A-Cath placed.  This was discontinued due to evidence of disease progression.   Dr. Angelina [name] recommended for the patient treatment with Temodar 200 Mg/M2 on days 1-7 and 15-21 in addition to Avastin 5 Mg/M2 on days 8 and 22 every 4 weeks.  He is here today for day 8 cycle 2.   Patient was seen with Dr. Julien Nordmann today.  The patient is expected to have a second opinion at [facility].  He is also scheduled to see Dr. Angelina [name] on 06/12/2022.  He states he is going to receive day 22 cycle 2 at [facility] and have a repeat CT scan that day.  Dr. Julien Nordmann discussed that with his fibrous tumor in the lung that may confuse the picture whether or not there is infectious process or not with a chest x-ray.  Therefore Dr. Julien Nordmann recommends empiric treatment for possible respiratory infection with Levaquin.  I have sent a prescription for 750 mg to take p.o. daily for 7 days to the patient's pharmacy.  We discussed small risk for tendon rupture with Levaquin.  Labs were reviewed.  I do not see that he has had a urine protein done.  As long as this is within parameters, [name] to proceed with treatment. As long as his urine protein is <300, [name] to proceed with avastin.  Infusion room, his blood pressure was rechecked and  it was 156/93.  Recommend that proceed  with day 8 cycle 2 as scheduled.   We will see him back for follow-up visit in 4 weeks for day 8 cycle #3 of treatment   He will continue to follow with endocrinology regarding his hypoglycemia.  He is currently taking prednisone daily.  I have sent the patient a prescription for Lasix to use 1 tablet 20 mill equivalents as needed for swelling.  Discussed that decongestants may elevate his blood pressure and to avoid decongestants.  Close for cough he may use Delsym or Robitussin.  If he has worsening symptoms such as fevers, chills, worsening cough and shortness of breath, weakness, etc. He was advised to call for reevaluation.  The patient was advised to call immediately if he has any concerning symptoms in the interval. The patient voices understanding of current disease status and treatment options and is in agreement with the current care plan. All questions were answered. The patient knows to call the clinic with any problems, questions or concerns. We can certainly see the patient much sooner if necessary   No orders of the defined types were placed in this encounter.    Azharia Surratt L Kessa Fairbairn, PA-C 05/29/22  ADDENDUM: Hematology/Oncology Attending: I had a face-to-face encounter with the patient today.  I reviewed his record, lab and recommended his care plan.  This is a very pleasant 61 years old African-American male with recurrent solitary fibrous tumor of the right lung diagnosed initially in June 2017 status post several resections for disease recurrence.  He was also treated in the past with sunitinib as well as Pazopanib with no response and further disease progression.  He was also treated at [facility] with doxorubicin, ifosfamide and mesna for 2 cycles discontinued secondary to disease progression. The patient was seen by Dr. Angelina [name] and he recommended for him treatment with Temodar and Avastin every 2 weeks.  He is status post 1  cycle and he is here for day 8 of cycle #2.  He has been tolerating this treatment well except for the hypertension from the treatment with Avastin. I recommended for the patient to proceed with day 8 of cycle #2 today as planned He is also planning to see a sarcoma specialist at [facility] in [place] in the next few days and I strongly encouraged him to consider any additional opinion for management of his condition. He has some chest congestion and we will give him treatment with Levaquin for 7 days. For the hypertension he was advised to check his blood pressure regularly at home and to discuss with his primary care physician for adjustment of his medication if needed. The patient will come back for follow-up visit in 4 weeks before starting day 8 of cycle #3. He was advised to call immediately if he has any other concerning symptoms in the interval.  The total time spent in the appointment was 30 minutes. Disclaimer: This note was dictated with voice recognition software. Similar sounding words can inadvertently be transcribed and may be missed upon review. Eilleen Kempf, MD

## 2022-05-29 ENCOUNTER — Inpatient Hospital Stay: Payer: BC Managed Care – PPO

## 2022-05-29 ENCOUNTER — Inpatient Hospital Stay: Payer: BC Managed Care – PPO | Attending: Internal Medicine | Admitting: Physician Assistant

## 2022-05-29 VITALS — BP 160/86 | HR 97 | Temp 99.5°F | Resp 19

## 2022-05-29 VITALS — BP 169/90 | HR 102 | Temp 98.3°F | Resp 24 | Wt 193.3 lb

## 2022-05-29 DIAGNOSIS — R609 Edema, unspecified: Secondary | ICD-10-CM | POA: Diagnosis not present

## 2022-05-29 DIAGNOSIS — I10 Essential (primary) hypertension: Secondary | ICD-10-CM | POA: Insufficient documentation

## 2022-05-29 DIAGNOSIS — J189 Pneumonia, unspecified organism: Secondary | ICD-10-CM

## 2022-05-29 DIAGNOSIS — R918 Other nonspecific abnormal finding of lung field: Secondary | ICD-10-CM

## 2022-05-29 DIAGNOSIS — D492 Neoplasm of unspecified behavior of bone, soft tissue, and skin: Secondary | ICD-10-CM

## 2022-05-29 DIAGNOSIS — D649 Anemia, unspecified: Secondary | ICD-10-CM

## 2022-05-29 DIAGNOSIS — I159 Secondary hypertension, unspecified: Secondary | ICD-10-CM

## 2022-05-29 DIAGNOSIS — C3491 Malignant neoplasm of unspecified part of right bronchus or lung: Secondary | ICD-10-CM | POA: Diagnosis present

## 2022-05-29 DIAGNOSIS — Z5111 Encounter for antineoplastic chemotherapy: Secondary | ICD-10-CM | POA: Diagnosis not present

## 2022-05-29 DIAGNOSIS — Z95828 Presence of other vascular implants and grafts: Secondary | ICD-10-CM

## 2022-05-29 LAB — CBC WITH DIFFERENTIAL (CANCER CENTER ONLY)
Abs Immature Granulocytes: 0.14 10*3/uL — ABNORMAL HIGH (ref 0.00–0.07)
Basophils Absolute: 0 10*3/uL (ref 0.0–0.1)
Basophils Relative: 0 %
Eosinophils Absolute: 0 10*3/uL (ref 0.0–0.5)
Eosinophils Relative: 0 %
HCT: 40.8 % (ref 39.0–52.0)
Hemoglobin: 13.2 g/dL (ref 13.0–17.0)
Immature Granulocytes: 1 %
Lymphocytes Relative: 2 %
Lymphs Abs: 0.3 10*3/uL — ABNORMAL LOW (ref 0.7–4.0)
MCH: 33.2 pg (ref 26.0–34.0)
MCHC: 32.4 g/dL (ref 30.0–36.0)
MCV: 102.8 fL — ABNORMAL HIGH (ref 80.0–100.0)
Monocytes Absolute: 0.7 10*3/uL (ref 0.1–1.0)
Monocytes Relative: 6 %
Neutro Abs: 10.4 10*3/uL — ABNORMAL HIGH (ref 1.7–7.7)
Neutrophils Relative %: 91 %
Platelet Count: 139 10*3/uL — ABNORMAL LOW (ref 150–400)
RBC: 3.97 MIL/uL — ABNORMAL LOW (ref 4.22–5.81)
RDW: 18.9 % — ABNORMAL HIGH (ref 11.5–15.5)
WBC Count: 11.5 10*3/uL — ABNORMAL HIGH (ref 4.0–10.5)
nRBC: 0 % (ref 0.0–0.2)

## 2022-05-29 LAB — CMP (CANCER CENTER ONLY)
ALT: 47 U/L — ABNORMAL HIGH (ref 0–44)
AST: 33 U/L (ref 15–41)
Albumin: 3.7 g/dL (ref 3.5–5.0)
Alkaline Phosphatase: 90 U/L (ref 38–126)
Anion gap: 5 (ref 5–15)
BUN: 15 mg/dL (ref 8–23)
CO2: 31 mmol/L (ref 22–32)
Calcium: 9.2 mg/dL (ref 8.9–10.3)
Chloride: 106 mmol/L (ref 98–111)
Creatinine: 0.81 mg/dL (ref 0.61–1.24)
GFR, Estimated: >60 mL/min (ref >60)
Glucose, Bld: 118 mg/dL — ABNORMAL HIGH (ref 70–99)
Potassium: 3.8 mmol/L (ref 3.5–5.1)
Sodium: 142 mmol/L (ref 135–145)
Total Bilirubin: 0.9 mg/dL (ref 0.3–1.2)
Total Protein: 5.8 g/dL — ABNORMAL LOW (ref 6.5–8.1)

## 2022-05-29 LAB — SAMPLE TO BLOOD BANK

## 2022-05-29 LAB — TOTAL PROTEIN, URINE DIPSTICK: Protein, ur: 30 mg/dL — AB

## 2022-05-29 MED ORDER — SODIUM CHLORIDE 0.9% FLUSH
10.0000 mL | Freq: Once | INTRAVENOUS | Status: AC
  Administered 2022-05-29: 10 mL

## 2022-05-29 MED ORDER — HEPARIN SOD (PORK) LOCK FLUSH 100 UNIT/ML IV SOLN
500.0000 [IU] | Freq: Once | INTRAVENOUS | Status: AC | PRN
  Administered 2022-05-29: 500 [IU]

## 2022-05-29 MED ORDER — SODIUM CHLORIDE 0.9% FLUSH
10.0000 mL | INTRAVENOUS | Status: DC | PRN
  Administered 2022-05-29: 10 mL

## 2022-05-29 MED ORDER — FUROSEMIDE 20 MG PO TABS: 20.0000 mg | ORAL_TABLET | Freq: Every day | ORAL | 0 refills | Status: DC | PRN

## 2022-05-29 MED ORDER — SODIUM CHLORIDE 0.9 % IV SOLN
5.0000 mg/kg | Freq: Once | INTRAVENOUS | Status: AC
  Administered 2022-05-29: 400 mg via INTRAVENOUS
  Filled 2022-05-29: qty 16

## 2022-05-29 MED ORDER — SODIUM CHLORIDE 0.9 % IV SOLN: Freq: Once | INTRAVENOUS | Status: AC

## 2022-05-29 MED ORDER — LEVOFLOXACIN 750 MG PO TABS: 750.0000 mg | ORAL_TABLET | Freq: Every day | ORAL | 0 refills | Status: DC

## 2022-05-29 NOTE — Patient Instructions (Signed)
[facility]  Discharge Instructions: Thank you for choosing [facility] to provide your oncology and hematology care.   If you have a lab appointment with the [facility], please go directly to the [facility] and check in at the registration area.   Wear comfortable clothing and clothing appropriate for easy access to any Portacath or PICC line.   We strive to give you quality time with your provider. You may need to reschedule your appointment if you arrive late (15 or more minutes).  Arriving late affects you and other patients whose appointments are after yours.  Also, if you miss three or more appointments without notifying the office, you may be dismissed from the clinic at the provider's discretion.      For prescription refill requests, have your pharmacy contact our office and allow 72 hours for refills to be completed.    Today you received the following chemotherapy and/or immunotherapy agents: Bevacizumab (Avastin)      To help prevent nausea and vomiting after your treatment, we encourage you to take your nausea medication as directed.  BELOW ARE SYMPTOMS THAT SHOULD BE REPORTED IMMEDIATELY: *FEVER GREATER THAN 100.4 F (38 C) OR HIGHER *CHILLS OR SWEATING *NAUSEA AND VOMITING THAT IS NOT CONTROLLED WITH YOUR NAUSEA MEDICATION *UNUSUAL SHORTNESS OF BREATH *UNUSUAL BRUISING OR BLEEDING *URINARY PROBLEMS (pain or burning when urinating, or frequent urination) *BOWEL PROBLEMS (unusual diarrhea, constipation, pain near the anus) TENDERNESS IN MOUTH AND THROAT WITH OR WITHOUT PRESENCE OF ULCERS (sore throat, sores in mouth, or a toothache) UNUSUAL RASH, SWELLING OR PAIN  UNUSUAL VAGINAL DISCHARGE OR ITCHING   Items with * indicate a potential emergency and should be followed up as soon as possible or go to the Emergency Department if any problems should occur.  Please show the CHEMOTHERAPY ALERT CARD or IMMUNOTHERAPY ALERT CARD at  check-in to the Emergency Department and triage nurse.  Should you have questions after your visit or need to cancel or reschedule your appointment, please contact [facility]  Dept: [phone]  and follow the prompts.  Office hours are 8:00 a.m. to 4:30 p.m. Monday - Friday. Please note that voicemails left after 4:00 p.m. may not be returned until the following business day.  We are closed weekends and major holidays. You have access to a nurse at all times for urgent questions. Please call the main number to the clinic Dept: [phone] and follow the prompts.   For any non-urgent questions, you may also contact your provider using MyChart. We now offer e-Visits for anyone 59 and older to request care online for non-urgent symptoms. For details visit [email or web address].   Also download the MyChart app! Go to the app store, search "MyChart", open the app, select [facility], and log in with your MyChart username and password.  Masks are optional in the cancer centers. If you would like for your care team to wear a mask while they are taking care of you, please let them know. You may have one support person who is at least 63 years old accompany you for your appointments. Bevacizumab Injection What is this medication? BEVACIZUMAB (be va SIZ yoo mab) treats some types of cancer. It works by blocking a protein that causes cancer cells to grow and multiply. This helps to slow or stop the spread of cancer cells. It is a monoclonal antibody. This medicine may be used for other purposes; ask your health care provider or  pharmacist if you have questions. COMMON BRAND NAME(S): Alymsys, Avastin, MVASI, Noah Charon What should I tell my care team before I take this medication? They need to know if you have any of these conditions: Blood clots Coughing up blood Having or recent surgery Heart failure High blood pressure History of a connection between 2 or more body parts  that do not usually connect (fistula) History of a tear in your stomach or intestines Protein in your urine An unusual or allergic reaction to bevacizumab, other medications, foods, dyes, or preservatives Pregnant or trying to get pregnant Breast-feeding How should I use this medication? This medication is injected into a vein. It is given by your care team in a hospital or clinic setting. Talk to your care team the use of this medication in children. Special care may be needed. Overdosage: If you think you have taken too much of this medicine contact a poison control center or emergency room at once. NOTE: This medicine is only for you. Do not share this medicine with others. What if I miss a dose? Keep appointments for follow-up doses. It is important not to miss your dose. Call your care team if you are unable to keep an appointment. What may interact with this medication? Interactions are not expected. This list may not describe all possible interactions. Give your health care provider a list of all the medicines, herbs, non-prescription drugs, or dietary supplements you use. Also tell them if you smoke, drink alcohol, or use illegal drugs. Some items may interact with your medicine. What should I watch for while using this medication? Your condition will be monitored carefully while you are receiving this medication. You may need blood work while taking this medication. This medication may make you feel generally unwell. This is not uncommon as chemotherapy can affect healthy cells as well as cancer cells. Report any side effects. Continue your course of treatment even though you feel ill unless your care team tells you to stop. This medication may increase your risk to bruise or bleed. Call your care team if you notice any unusual bleeding. Before having surgery, talk to your care team to make sure it is ok. This medication can increase the risk of poor healing of your surgical site or  wound. You will need to stop this medication for 28 days before surgery. After surgery, wait at least 28 days before restarting this medication. Make sure the surgical site or wound is healed enough before restarting this medication. Talk to your care team if questions. Talk to your care team if you may be pregnant. Serious birth defects can occur if you take this medication during pregnancy and for 6 months after the last dose. Contraception is recommended while taking this medication and for 6 months after the last dose. Your care team can help you find the option that works for you. Do not breastfeed while taking this medication and for 6 months after the last dose. This medication can cause infertility. Talk to your care team if you are concerned about your fertility. What side effects may I notice from receiving this medication? Side effects that you should report to your care team as soon as possible: Allergic reactions--skin rash, itching, hives, swelling of the face, lips, tongue, or throat Bleeding--bloody or black, tar-like stools, vomiting blood or brown material that looks like coffee grounds, red or dark brown urine, small red or purple spots on skin, unusual bruising or bleeding Blood clot--pain, swelling, or warmth in the leg,  shortness of breath, chest pain Heart attack--pain or tightness in the chest, shoulders, arms, or jaw, nausea, shortness of breath, cold or clammy skin, feeling faint or lightheaded Heart failure--shortness of breath, swelling of the ankles, feet, or hands, sudden weight gain, unusual weakness or fatigue Increase in blood pressure Infection--fever, chills, cough, sore throat, wounds that don't heal, pain or trouble when passing urine, general feeling of discomfort or being unwell Infusion reactions--chest pain, shortness of breath or trouble breathing, feeling faint or lightheaded Kidney injury--decrease in the amount of urine, swelling of the ankles, hands, or  feet Stomach pain that is severe, does not go away, or gets worse Stroke--sudden numbness or weakness of the face, arm, or leg, trouble speaking, confusion, trouble walking, loss of balance or coordination, dizziness, severe headache, change in vision Sudden and severe headache, confusion, change in vision, seizures, which may be signs of posterior reversible encephalopathy syndrome (PRES) Side effects that usually do not require medical attention (report to your care team if they continue or are bothersome): Back pain Change in taste Diarrhea Dry skin Increased tears Nosebleed This list may not describe all possible side effects. Call your doctor for medical advice about side effects. You may report side effects to FDA at 1-800-FDA-1088. Where should I keep my medication? This medication is given in a hospital or clinic. It will not be stored at home. NOTE: This sheet is a summary. It may not cover all possible information. If you have questions about this medicine, talk to your doctor, pharmacist, or health care provider.  2023 Elsevier/Gold Standard (2021-10-04 00:00:00)

## 2022-06-01 ENCOUNTER — Other Ambulatory Visit: Payer: Self-pay

## 2022-06-02 ENCOUNTER — Telehealth: Payer: Self-pay | Admitting: Internal Medicine

## 2022-06-02 ENCOUNTER — Other Ambulatory Visit: Payer: Self-pay

## 2022-06-02 NOTE — Telephone Encounter (Signed)
Called patient regarding upcoming January, February and March appointments. Patient is notified.

## 2022-06-03 ENCOUNTER — Other Ambulatory Visit: Payer: Self-pay

## 2022-06-04 ENCOUNTER — Encounter: Payer: Self-pay | Admitting: Internal Medicine

## 2022-06-12 ENCOUNTER — Other Ambulatory Visit: Payer: BC Managed Care – PPO

## 2022-06-12 ENCOUNTER — Other Ambulatory Visit: Payer: Self-pay

## 2022-06-12 ENCOUNTER — Ambulatory Visit: Payer: BC Managed Care – PPO

## 2022-06-12 ENCOUNTER — Ambulatory Visit: Payer: BC Managed Care – PPO | Admitting: Physician Assistant

## 2022-06-17 ENCOUNTER — Other Ambulatory Visit: Payer: Self-pay

## 2022-06-17 ENCOUNTER — Encounter: Payer: Self-pay | Admitting: Internal Medicine

## 2022-06-18 ENCOUNTER — Other Ambulatory Visit: Payer: Self-pay

## 2022-06-18 ENCOUNTER — Other Ambulatory Visit: Payer: BC Managed Care – PPO

## 2022-06-24 ENCOUNTER — Encounter: Payer: Self-pay | Admitting: Urology

## 2022-06-24 ENCOUNTER — Other Ambulatory Visit
Admission: RE | Admit: 2022-06-24 | Discharge: 2022-06-24 | Disposition: A | Discharge status: Home / Self Care | Payer: BC Managed Care – PPO | Attending: Urology | Admitting: Urology

## 2022-06-24 ENCOUNTER — Ambulatory Visit: Payer: BC Managed Care – PPO | Admitting: Urology

## 2022-06-24 VITALS — BP 145/84 | HR 74 | Ht 69.0 in | Wt 179.2 lb

## 2022-06-24 DIAGNOSIS — R972 Elevated prostate specific antigen [PSA]: Secondary | ICD-10-CM | POA: Insufficient documentation

## 2022-06-24 LAB — PSA: Prostatic Specific Antigen: 10.2 ng/mL — ABNORMAL HIGH (ref 0.00–4.00)

## 2022-06-24 NOTE — Patient Instructions (Signed)
Prostate Biopsy Instructions  Stop all aspirin or blood thinners (aspirin, plavix, coumadin, warfarin, motrin, ibuprofen, advil, aleve, naproxen, naprosyn) for 7 days prior to the procedure.  If you have any questions about stopping these medications, please contact your primary care physician or cardiologist.  Having a light meal prior to the procedure is recommended.  If you are diabetic or have low blood sugar please bring a small snack or glucose tablet.  A Fleets enema is needed to be purchased over the counter at a local pharmacy and used 2 hours before you scheduled appointment.  This can be purchased over the counter at any pharmacy.  Antibiotics will be administered in the clinic at the time of the procedure unless otherwise specified.    Please bring someone with you to the procedure to drive you home.  A follow up appointment has been scheduled for you to receive the results of the biopsy.  If you have any questions or concerns, please feel free to call the office at [phone] or send a Mychart message.    Thank you, Staff at [facility] A transrectal ultrasound-guided prostate biopsy is a procedure to remove samples of prostate tissue for testing. The prostate is a walnut-sized gland that is located below the bladder and in front of the rectum. During this procedure, a small device (probe) is lubricated and put inside the rectum. The probe sends out sound waves that make a picture of the prostate and surrounding tissues (transrectal ultrasound). The images are used to help guide the process of removing the samples. The samples are taken to a lab to be checked for prostate cancer. This procedure is usually done to evaluate the prostate gland of men who have raised (elevated) levels of prostate-specific antigen (PSA), which can be a sign of prostate cancer or prostate enlargement related to aging (benign prostatic  hyperplasia, or BPH). Tell a health care provider about: Any allergies you have. All medicines you are taking, including vitamins, herbs, eye drops, creams, and over-the-counter medicines. Any problems you or family members have had with anesthetic medicines. Any bleeding problems you have. Any surgeries you have had. Any medical conditions you have. Any prostate infections you have had. What are the risks? Generally, this is a safe procedure. However, problems may occur, including: Prostate infection. Bleeding from the rectum. Blood in the urine. Allergic reactions to medicines. Damage to surrounding structures such as blood vessels, organs, or muscles. Difficulty passing urine. Nerve damage. This is usually temporary. What happens before the procedure? Medicines Ask your health care provider about: Changing or stopping your regular medicines. This is especially important if you are taking diabetes medicines or blood thinners. Taking medicines such as aspirin and ibuprofen. These medicines can thin your blood. Do not take these medicines unless your health care provider tells you to take them. Taking over-the-counter medicines, vitamins, herbs, and supplements. General instructions Follow instructions from your health care provider about eating and drinking. In most instances, you will not need to stop eating and drinking completely before the procedure. You will be given an enema. During an enema, a liquid is injected into your rectum to clear out waste. You may have a blood or urine sample taken. Ask your health care provider what steps will be taken to help prevent infection. These steps may include: Washing skin with a germ-killing soap. Taking antibiotic medicine. If you will be going home right after the procedure, plan to have a responsible  adult: Take you home from the hospital or clinic. You will not be allowed to drive. Care for you for the time you are told. What happens  during the procedure?  An IV will be inserted into one of your veins. You will be given one or both of the following: A medicine to help you relax (sedative). A medicine to numb the area (local anesthetic). You will be placed on your left side, and your knees will be bent toward your chest. A probe with lubricated gel will be placed into your rectum, and images will be taken of your prostate and surrounding structures. Numbing medicine will be injected into your prostate. A biopsy needle will be inserted through your rectum or perineum and guided to your prostate using the ultrasound images. Prostate tissue samples will be removed, and the needle and probe will then be removed. The biopsy samples will be sent to a lab to be tested. The procedure may vary among health care providers and hospitals. What happens after the procedure? Your blood pressure, heart rate, breathing rate, and blood oxygen level will be monitored until you leave the hospital or clinic. You may have some discomfort in the rectal area. You will be given pain medicine as needed. If you were given a sedative during the procedure, it can affect you for several hours. Do not drive or operate machinery until your health care provider says that it is safe. It is up to you to get the results of your procedure. Ask your health care provider, or the department that is doing the procedure, when your results will be ready. Keep all follow-up visits. This is important. Summary A transrectal ultrasound-guided biopsy removes samples of tissue from your prostate using ultrasound-guided sound waves to help guide the process. This procedure is usually done to evaluate the prostate gland of men who have raised (elevated) levels of prostate-specific antigen (PSA), which can be a sign of prostate cancer or prostate enlargement related to aging. After your procedure, you may feel some discomfort in the rectal area. Plan to have a responsible  adult take you home from the hospital or clinic, and follow up with your health care provider for your results. This information is not intended to replace advice given to you by your health care provider. Make sure you discuss any questions you have with your health care provider. Document Revised: 11/26/2020 Document Reviewed: 11/26/2020 Elsevier Patient Education  [place].

## 2022-06-24 NOTE — Progress Notes (Signed)
   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  Unfortunately, on CT chest from April 2023 he was found to have significant progression of his disease.  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  He currently is managed by Dr. Caleen Jobs at [facility] and is on Bevacizumb/Temozolomide.  Most recent cross-sectional imaging with CT from 06/12/2022 shows stable to slightly increased multifocal malignant masses within the right upper quadrant/right pleura as well as some possible internal necrosis potentially related to treatment effect.  No lymphadenopathy.  I am unable to personally review the images as they are in the [facility] system.  I have been following him for a elevated PSA.  PSA was 2.1 in October 2020, 3.8 in October 2021, 5 in October 2020, 5.4(9% free) in January 2023, and most recently 8.1 in April 2023.  Calculated PSA doubling time is 18 months.  PSA today is pending.  Using shared decision making we had opted for a prostate MRI.  I personally viewed and interpreted those images dated 01/10/2022 that show a 24 g gland with a 2 cm PI-RADS category 5 lesion concerning for prostate cancer in the right posterior lateral peripheral zone in the mid gland and apex.  With his complex history and malignant pulmonary fibrous tumor with unclear prognosis, he opted to hold off on biopsy and see how treatments went through [facility] oncology.  He is  encouraged by his progress thus far, and sounds like both him and his oncologist are interested in further pursuing the elevated PSA at this time and considering biopsy.  Risk and benefits discussed extensively with his complex history.  Ultimately, using shared decision making opted for PSA today, with plan for prostate biopsy if PSA continues to rise  PSA today, call with results.  If continues to rise will schedule prostate biopsy.  I think we can perform a cognitive guided MRI biopsy in [place] instead of having to wait to have him seen in [place] for an MRI fusion biopsy  [name], MD  [facility] [address] [place], [place] [address] [phone]

## 2022-06-24 NOTE — Progress Notes (Unsigned)
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 Roxan Hockey. 2) Sunitinib 3 7.5 mg p.o. daily.  First dose started April 08, 2021.  Status post 3 months of treatment.  This treatment was discontinued secondary to disease progression. 3) Votrient (pazopanib) 800 mg p.o. daily.  Started August 10, 2021.  Status post 2 months of treatment discontinued secondary to disease progression. 4) status post redo right thoracotomy with resection of pleural tumors and lymph node dissection under the care of Dr. Roxan Hockey on Nov 13, 2021. 5) Systemic chemotherapy with doxorubicin 25 Mg/M2, ifosfamide 2500 Mg/M2 and mesna 2500 Mg/M2 days 1-3 every 3 weeks.  First dose February 12, 2022 at [facility].  Status post 2 cycles of treatment discontinued secondary to disease progression.   CURRENT THERAPY:  Systemic chemotherapy with Temodar 200 Mg/M2 on days 1-7 and day 15-21 with a Avastin 5 Mg/KG on days 8 and 22 every 4 weeks.  He is status post day 22 cycle 2    INTERVAL HISTORY: Mathew [name] 62 y.o. male returns to the clinic today for a follow-up visit accompanied by his wife.  The patient was last seen by myself 4 weeks ago.  The patient recently had a follow-up appointment with Dr. Angelina Ok at [facility].  He recommended that the patient continue on the same treatment at the same dose at this time.  There was some slight increase in the multifocal malignant mass in the right upper abdomen/right pleura but there was also increased interval necrosis which could represent treatment effect.  Therefore, Dr. Angelina Ok recommended continuing on treatment with close follow-up in 2  months.  He also had went to [facility] for a second opinion.  They also recommended that he continue on his current treatment and discussed if he had progression in the future, they would have additional options.  They also discussed that he may be candidate for clinical trials although he is currently being worked up for possible prostate cancer and that may preclude him from trials.   He had a follow-up appointment with his urologist Dr. Georgiann Mohs.  His PSA had increased to 10.2  from 5.4 on 06/20/21 and they are considering him for a prostate biopsy.  The patient is wondering what Dr. Worthy Flank opinion is on that at this time.    The patient is feeling well.  The patient denies any fever, chills, night sweats, or unexplained weight loss.  He did have RSV since last being seen.  He reports his shortness of breath is better.  Denies any cough or hemoptysis.  He still has significant pitting edema/swelling in his lower extremities but this is greatly improved compared to prior.  Sometimes he has some right-sided chest discomfort secondary to his pleural-based disease.  He denies any nausea, vomiting, diarrhea, or constipation.  He reports that his blood sugar has been better recently and he has not had as many bouts of hypoglycemia.  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  He is here today for evaluation and repeat blood work before undergoing day 8 cycle #3.       MEDICAL  HISTORY: Past Medical History:  Diagnosis Date   Arthritis    osteo   Asthma    asthma as a child   Clubbing of nails    Deafness in left ear    GERD (gastroesophageal reflux disease)    History of kidney stones    Metastatic sarcoma (HCC)    Pneumonia    Shortness of breath dyspnea    just initially   Vitamin D deficiency     ALLERGIES:  is allergic to aspirin and penicillins.  MEDICATIONS:  Current Outpatient Medications  Medication Sig  Dispense Refill   Accu-Chek Softclix Lancets lancets SMARTSIG:Topical     amLODipine (NORVASC) 10 MG tablet Take 20 mg by mouth daily.     predniSONE (DELTASONE) 5 MG tablet Take 5 mg by mouth 2 (two) times daily. Take 15 mg in the morning and 10 mg in the evening by mouth.     No current facility-administered medications for this visit.   Facility-Administered Medications Ordered in Other Visits  Medication Dose Route Frequency Provider Last Rate Last Admin   sodium chloride flush (NS) 0.9 % injection 10 mL  10 mL Intracatheter PRN Curt Bears, MD   10 mL at 06/26/22 1255    SURGICAL HISTORY:  Past Surgical History:  Procedure Laterality Date   INTERCOSTAL NERVE BLOCK Right 04/27/2020   Procedure: INTERCOSTAL NERVE BLOCK;  Surgeon: [name], MD;  Location: [place];  Service: Thoracic;  Laterality: Right;   KNEE CARTILAGE SURGERY Left 1976   pleural tumor Right 01/22/2016   resection of solitary fibrous tumor   RESECTION OF MEDIASTINAL MASS Right 01/23/2016   this is in error- no mediastinal tumor   RESECTION OF MEDIASTINAL MASS Right 11/13/2021   Procedure: RESECTION OF PLEURAL TUMORS;  Surgeon: [name], MD;  Location[facility];  Service: Thoracic;  Laterality: Right;   THORACOTOMY Right 04/27/2020   Procedure: THORACOTOMY;  Surgeon: [name], MD;  Location: [place];  Service: Thoracic;  Laterality: Right;   THORACOTOMY Right 11/13/2021   Procedure: REDO THORACOTOMY;  Surgeon: [name], MD;  Location: [place];  Service: Thoracic;  Laterality: Right;   TONSILLECTOMY     VIDEO BRONCHOSCOPY WITH ENDOBRONCHIAL ULTRASOUND N/A 12/17/2015   Procedure: VIDEO BRONCHOSCOPY WITH ENDOBRONCHIAL ULTRASOUND;  Surgeon: [name], MD;  Location: [place];  Service: Thoracic;  Laterality: N/A;    REVIEW OF SYSTEMS:   Constitutional: Positive for stable fatigue. Negative for appetite change, chills, fever and unexpected weight change.  HENT:  Negative for mouth sores, nosebleeds, sore throat and trouble swallowing.   Eyes: Negative for eye problems and icterus.  Respiratory: Positive for improving dyspnea on exertion. Negative for hemoptysis and wheezing.   Cardiovascular: Positive for improving but persistent lower extremity swelling bilaterally and right-sided chest discomfort. Gastrointestinal: Negative for abdominal pain, constipation, diarrhea, nausea and vomiting.  Genitourinary: Negative for bladder incontinence, difficulty urinating, dysuria, frequency and hematuria.   Musculoskeletal: Negative for back pain, gait problem, neck pain and neck stiffness.  Skin: Negative for itching and rash.  Neurological: Negative for dizziness, extremity weakness, gait problem, headaches, light-headedness and seizures.  Hematological: Negative for adenopathy. Does not bruise/bleed easily.  Psychiatric/Behavioral: Negative for confusion, depression and sleep disturbance. The patient is not nervous/anxious.    PHYSICAL EXAMINATION:  Blood pressure (!) 157/93, pulse 89, temperature (!) 97.5 F (36.4 C), temperature source Oral, resp. rate 16, weight 179 lb (81.2 kg), SpO2 99 %.  ECOG PERFORMANCE STATUS: 1  Physical Exam  Constitutional: Oriented to person, place, and time and well-developed, well-nourished, and in no distress.  HENT:  Head: Normocephalic and atraumatic.  Mouth/Throat: Oropharynx is clear and moist. No oropharyngeal exudate.  Eyes: Conjunctivae are normal. Right eye exhibits no discharge. Left eye exhibits no discharge. No scleral icterus.  Neck: Normal range of motion. Neck supple.  Cardiovascular: Normal rate, regular rhythm, normal heart sounds and intact distal pulses.   Pulmonary/Chest: Effort normal.  Quiet breath sounds in the right lung.  No respiratory distress. No wheezes. No rales.  Abdominal: Soft. Bowel sounds are normal. Exhibits no distension and no mass. There is no tenderness.  Musculoskeletal: Normal  range of motion. Bilateral lower extremity swelling/pitting edema (improved compared to prior).  Lymphadenopathy:    No cervical adenopathy.  Neurological: Alert and oriented to person, place, and time. Exhibits normal muscle tone. Gait normal. Coordination normal.  Skin: Skin is warm and dry. No rash noted. Not diaphoretic. No erythema. No pallor.  Psychiatric: Mood, memory and judgment normal.  Vitals reviewed.  LABORATORY DATA: Lab Results  Component Value Date   WBC 7.8 06/26/2022   HGB 14.6 06/26/2022   HCT 45.1 06/26/2022   MCV 101.8 (H) 06/26/2022   PLT 146 (L) 06/26/2022      Chemistry      Component Value Date/Time   NA 140 06/26/2022 1054   K 3.8 06/26/2022 1054   CL 106 06/26/2022 1054   CO2 30 06/26/2022 1054   BUN 17 06/26/2022 1054   CREATININE 0.85 06/26/2022 1054      Component Value Date/Time   CALCIUM 9.3 06/26/2022 1054   ALKPHOS 91 06/26/2022 1054   AST 23 06/26/2022 1054   ALT 40 06/26/2022 1054   BILITOT 0.9 06/26/2022 1054       RADIOGRAPHIC STUDIES:  No results found.   ASSESSMENT/PLAN:  This is a very pleasant 62 year old male with recurrent solitary fibrous tumor of the right lung.  This was initially diagnosed in June 2017.    He is status post resection.  He had recurrence in October 2021 and underwent re-resection under the care of Dr. Roxan Hockey.  He had been on observation as there is no role for adjuvant therapy for his condition.  He was seen by Dr. Kendall Flack at [facility] who recommended observation and consideration of treatment with Sunitinib followed by surgery if the patient had disease recurrence.    The patient had a new 1.4 cm nodule posteriorly and the patient underwent treatment with sunitinib 37.5 mg p.o. daily.  He was on this for 3 months. He had been tolerating this fairly well except for hypertension.  Unfortunately scan showed evidence of disease progression with interval development of multiple new  pleural-based nodules in the right hemithorax measuring up to 2.9 cm which is concerning for recurrent/metastatic disease.    He was then seen by Dr. Roxan Hockey who offered surgical resection.  Dr. Kendall Flack recommended neoadjuvant treatment with pazopanib for 8 weeks followed by surgical resection if he had improvement in his disease followed by continued adjuvant treatment for another 6 to 8 months.  Unfortunately, after 2 months of treatment the CT scan showed clear evidence of disease progression with enlarging right pleural-based masses.  He was referred to Dr. 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  Molecular studies by foundation 1 and PD-L1 did not show any actionable mutations and his PD-L1 expression was negative.    The patient then saw Dr. Angelina Ok at [facility] who recommended inpatient systemic chemotherapy with doxorubicin, ifosfamide, and mesna IV every 3 weeks.  He had a 2D echo as well as a Port-A-Cath placed.  This was discontinued due to evidence of disease progression.   Dr. Angelina Ok recommended for the patient treatment with Temodar 200 Mg/M2 on days 1-7 and 15-21 in addition to Avastin 5 Mg/M2 on days 8 and 22 every 4 weeks.  He is here today for day 8 cycle 3.   The patient recently had a follow-up visit with his provider at [facility] and had a repeat CT scan. There was some slight increase in the multifocal malignant mass in the right upper abdomen/right pleura but there was also increased interval necrosis which could represent treatment effect.  Therefore, Dr. Angelina Ok recommended continuing on treatment with close follow-up in 2 months.  He also had went to [facility] for a second opinion.  They also recommended that he continue on his  current treatment and discussed if he had progression in the future, they would have additional options.  They also discussed that he may be candidate for clinical trials although he is currently being worked up for possible prostate cancer and that may preclude him from trials.   He is seeing Dr. Governor Rooks from urology.  Because of the increase in his PSA to 10.2, they are considering him for a prostate biopsy.  Dr. Julien Nordmann recommended that the patient be evaluated by urology for prostate biopsy.  Due to the effects of increased bleeding and delayed wound healing from Avastin, we would need to time this procedure in between his cycles of treatment.  We would cancel day 22 of cycle 3 if he were able to get his prostate biopsy around the dates of 1/25-07/17/22.  The patient and his wife will call the clinic if they are able to schedule his biopsy during this timeframe and we will cancel his scheduled appointment on 07/09/2022   The patient was seen with Dr. Julien Nordmann today.  Labs were reviewed.  Recommend he proceed with cycle number 3-day 8 today scheduled.  His urine protein is 30 per Dr. Julien Nordmann, we will hold off on ordering a 24-hour urine at this time.  We will see him back for follow-up visit in 4 weeks for day 8 cycle #4 of treatment   He will continue to follow with endocrinology regarding his hypoglycemia.  He is currently taking prednisone daily.  The patient was advised to call immediately if \\he  has any concerning symptoms in the interval. The patient voices understanding of current disease status and treatment options and is in agreement with the current care plan. All questions were answered. The patient knows to call the clinic with any problems, questions or concerns. We can certainly see the patient much sooner if necessary   No orders of the defined types were placed in this encounter.   Mendy Chou L Mazzie Brodrick, PA-C 06/26/22  ADDENDUM: Hematology/Oncology Attending: I had a  face-to-face encounter with the patient today.  I reviewed his record, lab and recommended his care plan.  This is a very pleasant 62 years old African-American male with recurrent solitary fibrous tumor of the right lung diagnosed initially in June 2017 status post several surgical resections followed by multiple treatment with targeted therapy including sunitinib, Votrient as well as systemic chemotherapy with  doxorubicin, ifosfamide and mesna.  All these regimens were discontinued secondary to disease progression. The patient is currently undergoing systemic chemotherapy with Temodar 200 Mg/M2 on days 1-7 and 15-21 with Avastin 5 Mg/KG on days 8 and 22 every 4 weeks status post 2 cycles.  The patient has been tolerating this treatment well except for fatigue. He was seen recently at [facility] by Dr. Kieth Brightly who recommended for him to continue with the same regiment as recommended by Dr. Angelina Ok at [facility]. I recommended for the patient to continue his treatment as planned.  He also had suspicious prostate cancer and there is a consideration of prostate biopsy.  I explained to the patient that we may have to hold a dose of Avastin before his biopsy to avoid any significant hemorrhage and wound healing delay.  I recommended for him to arrange that biopsy at least 3 weeks after his last dose of Avastin and at least 1 week before the next dose. The patient will come back for follow-up visit in 4 weeks for evaluation and management of any adverse effect of his treatment. He was advised to call immediately if he has any other concerning symptoms in the interval. The total time spent in the appointment was 30 minutes. Disclaimer: This [name] was dictated with voice recognition software. Similar sounding words can inadvertently be transcribed and may be missed upon review.

## 2022-06-25 ENCOUNTER — Other Ambulatory Visit: Payer: BC Managed Care – PPO

## 2022-06-25 ENCOUNTER — Telehealth: Payer: Self-pay

## 2022-06-25 DIAGNOSIS — R972 Elevated prostate specific antigen [PSA]: Secondary | ICD-10-CM

## 2022-06-25 NOTE — Telephone Encounter (Signed)
-----   Message from [name], MD sent at 06/25/2022 12:57 PM EST ----- PSA up to 10.2.  Like we discussed in clinic, please set up referral to [facility] in [facility] for the MRI fusion biopsy  Nickolas Madrid, MD 06/25/2022

## 2022-06-25 NOTE — Telephone Encounter (Signed)
Called pt no answer, left detailed message informing pt of the need to for referral to [facility]. Referral placed.

## 2022-06-26 ENCOUNTER — Inpatient Hospital Stay: Payer: BC Managed Care – PPO | Attending: Internal Medicine

## 2022-06-26 ENCOUNTER — Other Ambulatory Visit: Payer: Self-pay | Admitting: Physician Assistant

## 2022-06-26 ENCOUNTER — Other Ambulatory Visit: Payer: BC Managed Care – PPO

## 2022-06-26 ENCOUNTER — Inpatient Hospital Stay: Payer: BC Managed Care – PPO

## 2022-06-26 ENCOUNTER — Inpatient Hospital Stay (HOSPITAL_BASED_OUTPATIENT_CLINIC_OR_DEPARTMENT_OTHER): Payer: BC Managed Care – PPO | Admitting: Physician Assistant

## 2022-06-26 VITALS — BP 139/86 | HR 85 | Resp 18

## 2022-06-26 VITALS — BP 157/93 | HR 89 | Temp 97.5°F | Resp 16 | Wt 179.0 lb

## 2022-06-26 DIAGNOSIS — R918 Other nonspecific abnormal finding of lung field: Secondary | ICD-10-CM

## 2022-06-26 DIAGNOSIS — Z79899 Other long term (current) drug therapy: Secondary | ICD-10-CM | POA: Diagnosis not present

## 2022-06-26 DIAGNOSIS — D492 Neoplasm of unspecified behavior of bone, soft tissue, and skin: Secondary | ICD-10-CM | POA: Diagnosis not present

## 2022-06-26 DIAGNOSIS — Z5111 Encounter for antineoplastic chemotherapy: Secondary | ICD-10-CM | POA: Diagnosis not present

## 2022-06-26 DIAGNOSIS — D649 Anemia, unspecified: Secondary | ICD-10-CM

## 2022-06-26 DIAGNOSIS — I10 Essential (primary) hypertension: Secondary | ICD-10-CM | POA: Diagnosis not present

## 2022-06-26 DIAGNOSIS — Z7952 Long term (current) use of systemic steroids: Secondary | ICD-10-CM | POA: Diagnosis not present

## 2022-06-26 DIAGNOSIS — Z95828 Presence of other vascular implants and grafts: Secondary | ICD-10-CM

## 2022-06-26 DIAGNOSIS — J45909 Unspecified asthma, uncomplicated: Secondary | ICD-10-CM | POA: Insufficient documentation

## 2022-06-26 DIAGNOSIS — C3491 Malignant neoplasm of unspecified part of right bronchus or lung: Secondary | ICD-10-CM | POA: Insufficient documentation

## 2022-06-26 DIAGNOSIS — Z5112 Encounter for antineoplastic immunotherapy: Secondary | ICD-10-CM | POA: Diagnosis present

## 2022-06-26 DIAGNOSIS — R809 Proteinuria, unspecified: Secondary | ICD-10-CM

## 2022-06-26 LAB — CBC WITH DIFFERENTIAL (CANCER CENTER ONLY)
Abs Immature Granulocytes: 0.09 10*3/uL — ABNORMAL HIGH (ref 0.00–0.07)
Basophils Absolute: 0 10*3/uL (ref 0.0–0.1)
Basophils Relative: 0 %
Eosinophils Absolute: 0 10*3/uL (ref 0.0–0.5)
Eosinophils Relative: 0 %
HCT: 45.1 % (ref 39.0–52.0)
Hemoglobin: 14.6 g/dL (ref 13.0–17.0)
Immature Granulocytes: 1 %
Lymphocytes Relative: 4 %
Lymphs Abs: 0.3 10*3/uL — ABNORMAL LOW (ref 0.7–4.0)
MCH: 33 pg (ref 26.0–34.0)
MCHC: 32.4 g/dL (ref 30.0–36.0)
MCV: 101.8 fL — ABNORMAL HIGH (ref 80.0–100.0)
Monocytes Absolute: 0.3 10*3/uL (ref 0.1–1.0)
Monocytes Relative: 4 %
Neutro Abs: 7 10*3/uL (ref 1.7–7.7)
Neutrophils Relative %: 91 %
Platelet Count: 146 10*3/uL — ABNORMAL LOW (ref 150–400)
RBC: 4.43 MIL/uL (ref 4.22–5.81)
RDW: 14.1 % (ref 11.5–15.5)
WBC Count: 7.8 10*3/uL (ref 4.0–10.5)
nRBC: 0 % (ref 0.0–0.2)

## 2022-06-26 LAB — CMP (CANCER CENTER ONLY)
ALT: 40 U/L (ref 0–44)
AST: 23 U/L (ref 15–41)
Albumin: 3.8 g/dL (ref 3.5–5.0)
Alkaline Phosphatase: 91 U/L (ref 38–126)
Anion gap: 4 — ABNORMAL LOW (ref 5–15)
BUN: 17 mg/dL (ref 8–23)
CO2: 30 mmol/L (ref 22–32)
Calcium: 9.3 mg/dL (ref 8.9–10.3)
Chloride: 106 mmol/L (ref 98–111)
Creatinine: 0.85 mg/dL (ref 0.61–1.24)
GFR, Estimated: >60 mL/min (ref >60)
Glucose, Bld: 136 mg/dL — ABNORMAL HIGH (ref 70–99)
Potassium: 3.8 mmol/L (ref 3.5–5.1)
Sodium: 140 mmol/L (ref 135–145)
Total Bilirubin: 0.9 mg/dL (ref 0.3–1.2)
Total Protein: 6.2 g/dL — ABNORMAL LOW (ref 6.5–8.1)

## 2022-06-26 LAB — TOTAL PROTEIN, URINE DIPSTICK: Protein, ur: 30 mg/dL — AB

## 2022-06-26 MED ORDER — SODIUM CHLORIDE 0.9% FLUSH
10.0000 mL | Freq: Once | INTRAVENOUS | Status: AC
  Administered 2022-06-26: 10 mL

## 2022-06-26 MED ORDER — SODIUM CHLORIDE 0.9 % IV SOLN: Freq: Once | INTRAVENOUS | Status: AC

## 2022-06-26 MED ORDER — SODIUM CHLORIDE 0.9% FLUSH
10.0000 mL | INTRAVENOUS | Status: DC | PRN
  Administered 2022-06-26: 10 mL

## 2022-06-26 MED ORDER — HEPARIN SOD (PORK) LOCK FLUSH 100 UNIT/ML IV SOLN
500.0000 [IU] | Freq: Once | INTRAVENOUS | Status: AC | PRN
  Administered 2022-06-26: 500 [IU]

## 2022-06-26 MED ORDER — SODIUM CHLORIDE 0.9 % IV SOLN
5.0000 mg/kg | Freq: Once | INTRAVENOUS | Status: AC
  Administered 2022-06-26: 400 mg via INTRAVENOUS
  Filled 2022-06-26: qty 16

## 2022-06-30 ENCOUNTER — Telehealth: Payer: Self-pay | Admitting: Internal Medicine

## 2022-06-30 NOTE — Telephone Encounter (Signed)
Called patient regarding upcoming January-March appointments, left a voicemail.

## 2022-07-02 ENCOUNTER — Telehealth: Payer: Self-pay

## 2022-07-02 ENCOUNTER — Encounter: Payer: Self-pay | Admitting: Internal Medicine

## 2022-07-02 NOTE — Telephone Encounter (Signed)
Mathew [name] with [facility] states that the pt was referred to [facility] for a fusion bx. However, [facility] note from 06/24/22 states he needs a cognitive mri bx.   I confirmed with [facility] and pt needs a Fusion bx. Mathew [name] aware. Mathew [name] also states that the urologist at [facility] will want the pt to have a NP appt prior to the fusion bx due to his complex medical hx.   Mathew [name] will contact pt for appt.

## 2022-07-03 ENCOUNTER — Telehealth: Payer: Self-pay

## 2022-07-03 NOTE — Telephone Encounter (Signed)
Incoming call from Tammy at [facility] who states that this pt has been scheduled for an establish care appt with Dr. Arita Miss on 07/04/22, based on pt's complicated medical history providers at [facility] wanted to meet with pt prior to performing bx. Fusion bx has been scheduled for 07/16/22.

## 2022-07-04 ENCOUNTER — Encounter: Payer: Self-pay | Admitting: Urology

## 2022-07-08 ENCOUNTER — Encounter: Payer: Self-pay | Admitting: Internal Medicine

## 2022-07-09 ENCOUNTER — Inpatient Hospital Stay: Payer: BC Managed Care – PPO | Admitting: Internal Medicine

## 2022-07-09 ENCOUNTER — Inpatient Hospital Stay: Payer: BC Managed Care – PPO

## 2022-07-10 ENCOUNTER — Other Ambulatory Visit: Payer: BC Managed Care – PPO

## 2022-07-10 ENCOUNTER — Ambulatory Visit: Payer: BC Managed Care – PPO

## 2022-07-10 ENCOUNTER — Inpatient Hospital Stay: Payer: BC Managed Care – PPO

## 2022-07-10 ENCOUNTER — Ambulatory Visit: Payer: BC Managed Care – PPO | Admitting: Internal Medicine

## 2022-07-10 ENCOUNTER — Other Ambulatory Visit: Payer: Self-pay | Admitting: Physician Assistant

## 2022-07-10 ENCOUNTER — Inpatient Hospital Stay: Payer: BC Managed Care – PPO | Admitting: Internal Medicine

## 2022-07-16 ENCOUNTER — Telehealth: Payer: Self-pay

## 2022-07-16 NOTE — Telephone Encounter (Signed)
This nurse reached out to patient related to My Chart message.  Patient states that he needs to make sure that his appointments have been setup because they were changed due to him having a biopsy.  This nurse informed patient that his appointments have been updated.  And his next scheduled infusion is 2/8 and then 2/22.  Patient acknowledged understanding.  No further questions or concerns noted at this time.

## 2022-07-17 DIAGNOSIS — C61 Malignant neoplasm of prostate: Secondary | ICD-10-CM

## 2022-07-17 HISTORY — DX: Malignant neoplasm of prostate: C61

## 2022-07-22 ENCOUNTER — Telehealth: Payer: Self-pay

## 2022-07-22 ENCOUNTER — Telehealth: Payer: Self-pay | Admitting: Physician Assistant

## 2022-07-22 ENCOUNTER — Other Ambulatory Visit: Payer: Self-pay | Admitting: Physician Assistant

## 2022-07-22 NOTE — Telephone Encounter (Signed)
All appts r/s per 2/6 IB, tried to call pt but no answer, RN will send mychart msg to pt

## 2022-07-22 NOTE — Telephone Encounter (Signed)
Attempted to reach patient related to his treatment schedule.  Left a message for patient to check his My Chart.   No further concerns noted at this time.

## 2022-07-22 NOTE — Progress Notes (Signed)
There was some miscommunication regarding his treatment. Therefore, he is going to restart his temodar on 07/24/22. Therefore, day 8 of cycle 4 now will need to be on 2/15. I have adjusted the dates in the care plan and will send a message to scheduling.

## 2022-07-23 ENCOUNTER — Other Ambulatory Visit: Payer: Self-pay

## 2022-07-24 ENCOUNTER — Ambulatory Visit: Payer: BC Managed Care – PPO | Admitting: Internal Medicine

## 2022-07-24 ENCOUNTER — Other Ambulatory Visit: Payer: BC Managed Care – PPO

## 2022-07-24 ENCOUNTER — Inpatient Hospital Stay: Payer: BC Managed Care – PPO | Admitting: Physician Assistant

## 2022-07-24 ENCOUNTER — Inpatient Hospital Stay: Payer: BC Managed Care – PPO

## 2022-07-24 ENCOUNTER — Ambulatory Visit: Payer: BC Managed Care – PPO

## 2022-07-31 ENCOUNTER — Other Ambulatory Visit: Payer: Self-pay | Admitting: Physician Assistant

## 2022-07-31 ENCOUNTER — Inpatient Hospital Stay: Payer: BC Managed Care – PPO | Attending: Internal Medicine

## 2022-07-31 ENCOUNTER — Encounter: Payer: Self-pay | Admitting: Internal Medicine

## 2022-07-31 ENCOUNTER — Other Ambulatory Visit: Payer: Self-pay | Admitting: Hematology and Oncology

## 2022-07-31 ENCOUNTER — Inpatient Hospital Stay: Payer: BC Managed Care – PPO

## 2022-07-31 ENCOUNTER — Inpatient Hospital Stay (HOSPITAL_BASED_OUTPATIENT_CLINIC_OR_DEPARTMENT_OTHER): Payer: BC Managed Care – PPO | Admitting: Internal Medicine

## 2022-07-31 VITALS — BP 144/74 | HR 75 | Temp 98.3°F | Resp 16

## 2022-07-31 VITALS — BP 155/87 | HR 70 | Temp 98.1°F | Resp 16 | Wt 177.3 lb

## 2022-07-31 DIAGNOSIS — Z87442 Personal history of urinary calculi: Secondary | ICD-10-CM | POA: Diagnosis not present

## 2022-07-31 DIAGNOSIS — K219 Gastro-esophageal reflux disease without esophagitis: Secondary | ICD-10-CM | POA: Diagnosis not present

## 2022-07-31 DIAGNOSIS — D649 Anemia, unspecified: Secondary | ICD-10-CM

## 2022-07-31 DIAGNOSIS — Z79630 Long term (current) use of alkylating agent: Secondary | ICD-10-CM | POA: Insufficient documentation

## 2022-07-31 DIAGNOSIS — C3491 Malignant neoplasm of unspecified part of right bronchus or lung: Secondary | ICD-10-CM | POA: Diagnosis present

## 2022-07-31 DIAGNOSIS — J45909 Unspecified asthma, uncomplicated: Secondary | ICD-10-CM | POA: Diagnosis not present

## 2022-07-31 DIAGNOSIS — R5383 Other fatigue: Secondary | ICD-10-CM | POA: Insufficient documentation

## 2022-07-31 DIAGNOSIS — E559 Vitamin D deficiency, unspecified: Secondary | ICD-10-CM | POA: Diagnosis not present

## 2022-07-31 DIAGNOSIS — Z7952 Long term (current) use of systemic steroids: Secondary | ICD-10-CM | POA: Diagnosis not present

## 2022-07-31 DIAGNOSIS — Z95828 Presence of other vascular implants and grafts: Secondary | ICD-10-CM

## 2022-07-31 DIAGNOSIS — D492 Neoplasm of unspecified behavior of bone, soft tissue, and skin: Secondary | ICD-10-CM | POA: Diagnosis not present

## 2022-07-31 DIAGNOSIS — R918 Other nonspecific abnormal finding of lung field: Secondary | ICD-10-CM

## 2022-07-31 DIAGNOSIS — Z5112 Encounter for antineoplastic immunotherapy: Secondary | ICD-10-CM | POA: Diagnosis not present

## 2022-07-31 DIAGNOSIS — D61818 Other pancytopenia: Secondary | ICD-10-CM | POA: Diagnosis not present

## 2022-07-31 DIAGNOSIS — Z79899 Other long term (current) drug therapy: Secondary | ICD-10-CM | POA: Insufficient documentation

## 2022-07-31 LAB — CMP (CANCER CENTER ONLY)
ALT: 37 U/L (ref 0–44)
AST: 26 U/L (ref 15–41)
Albumin: 3.4 g/dL — ABNORMAL LOW (ref 3.5–5.0)
Alkaline Phosphatase: 73 U/L (ref 38–126)
Anion gap: 8 (ref 5–15)
BUN: 14 mg/dL (ref 8–23)
CO2: 28 mmol/L (ref 22–32)
Calcium: 9 mg/dL (ref 8.9–10.3)
Chloride: 105 mmol/L (ref 98–111)
Creatinine: 0.89 mg/dL (ref 0.61–1.24)
GFR, Estimated: >60 mL/min (ref >60)
Glucose, Bld: 87 mg/dL (ref 70–99)
Potassium: 3.7 mmol/L (ref 3.5–5.1)
Sodium: 141 mmol/L (ref 135–145)
Total Bilirubin: 0.9 mg/dL (ref 0.3–1.2)
Total Protein: 5.9 g/dL — ABNORMAL LOW (ref 6.5–8.1)

## 2022-07-31 LAB — CBC WITH DIFFERENTIAL (CANCER CENTER ONLY)
Abs Immature Granulocytes: 0.1 10*3/uL — ABNORMAL HIGH (ref 0.00–0.07)
Basophils Absolute: 0 10*3/uL (ref 0.0–0.1)
Basophils Relative: 0 %
Eosinophils Absolute: 0 10*3/uL (ref 0.0–0.5)
Eosinophils Relative: 0 %
HCT: 45.4 % (ref 39.0–52.0)
Hemoglobin: 15.1 g/dL (ref 13.0–17.0)
Immature Granulocytes: 2 %
Lymphocytes Relative: 7 %
Lymphs Abs: 0.5 10*3/uL — ABNORMAL LOW (ref 0.7–4.0)
MCH: 32.8 pg (ref 26.0–34.0)
MCHC: 33.3 g/dL (ref 30.0–36.0)
MCV: 98.7 fL (ref 80.0–100.0)
Monocytes Absolute: 0.6 10*3/uL (ref 0.1–1.0)
Monocytes Relative: 9 %
Neutro Abs: 5.5 10*3/uL (ref 1.7–7.7)
Neutrophils Relative %: 82 %
Platelet Count: 133 10*3/uL — ABNORMAL LOW (ref 150–400)
RBC: 4.6 MIL/uL (ref 4.22–5.81)
RDW: 13.1 % (ref 11.5–15.5)
WBC Count: 6.7 10*3/uL (ref 4.0–10.5)
nRBC: 0 % (ref 0.0–0.2)

## 2022-07-31 LAB — SAMPLE TO BLOOD BANK

## 2022-07-31 LAB — TOTAL PROTEIN, URINE DIPSTICK: Protein, ur: <30 mg/dL — AB

## 2022-07-31 MED ORDER — SODIUM CHLORIDE 0.9% FLUSH: 10.0000 mL | Freq: Once | INTRAVENOUS | Status: DC

## 2022-07-31 MED ORDER — SODIUM CHLORIDE 0.9% FLUSH
10.0000 mL | INTRAVENOUS | Status: DC | PRN
  Administered 2022-07-31: 10 mL

## 2022-07-31 MED ORDER — SODIUM CHLORIDE 0.9 % IV SOLN
5.0000 mg/kg | Freq: Once | INTRAVENOUS | Status: AC
  Administered 2022-07-31: 400 mg via INTRAVENOUS
  Filled 2022-07-31: qty 16

## 2022-07-31 MED ORDER — HEPARIN SOD (PORK) LOCK FLUSH 100 UNIT/ML IV SOLN
500.0000 [IU] | Freq: Once | INTRAVENOUS | Status: AC | PRN
  Administered 2022-07-31: 500 [IU]

## 2022-07-31 MED ORDER — SODIUM CHLORIDE 0.9 % IV SOLN: Freq: Once | INTRAVENOUS | Status: AC

## 2022-07-31 NOTE — Progress Notes (Signed)
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  PRIOR THERAPY:  1) status post resection and the patient has recurrence and October 2021 status post resection again under the care of Dr. Roxan Hockey. 2) Sunitinib 3 7.5 mg p.o. daily.  First dose started April 08, 2021.  Status post 3 months of treatment.  This treatment was discontinued secondary to disease progression. 3) Votrient (pazopanib) 800 mg p.o. daily.  Started August 10, 2021.  Status post 2 months of treatment discontinued secondary to disease progression. 4) status post redo right thoracotomy with resection of pleural tumors and lymph node dissection under the care of Dr. Roxan Hockey on Nov 13, 2021. 5) Systemic chemotherapy with doxorubicin 25 Mg/M2, ifosfamide 2500 Mg/M2 and mesna 2500 Mg/M2 days 1-3 every 3 weeks.  First dose February 12, 2022 at [facility].  Status post 2 cycles of treatment discontinued secondary to disease progression.   CURRENT THERAPY: He is expected to start treatment with systemic chemotherapy with Temodar 200 Mg/M2 on days 1-7 and day 15-21 with a Avastin 5 Mg/KG on days 8 and 22 every 4 weeks.  Status post 3 cycles  INTERVAL HISTORY: Mathew [name] 62 y.o. male returns to the clinic today for follow-up visit accompanied by his wife.  The patient is feeling fine today with no concerning complaints except for mild pain on the right side of the chest.  His breathing is much better with no significant shortness of breath, cough or hemoptysis.  He has no nausea, vomiting, diarrhea or constipation.  He has no headache or visual changes.  He underwent  prostate biopsy with [facility] by Dr. Leonia Reader pace and it was consistent with prostate adenocarcinoma with Gleason score of 7 (4+3).  The patient is here today for evaluation and resuming his treatment with Temodar and Avastin.   MEDICAL HISTORY: Past Medical History:  Diagnosis Date   Arthritis    osteo   Asthma    asthma as a child   Clubbing of nails    Deafness in left ear    GERD (gastroesophageal reflux disease)    History of kidney stones    Metastatic sarcoma (HCC)    Pneumonia    Shortness of breath dyspnea    just initially   Vitamin D deficiency     ALLERGIES:  is allergic to aspirin, levodopa, and penicillins.  MEDICATIONS:  Current Outpatient Medications  Medication Sig Dispense Refill   Accu-Chek Softclix Lancets lancets SMARTSIG:Topical     amLODipine (NORVASC) 10 MG tablet Take 20 mg by mouth daily.     predniSONE (DELTASONE) 5 MG tablet Take 5 mg by mouth 2 (two) times daily. Take 15 mg in the morning and 10 mg in the evening by mouth.     No current facility-administered medications for this visit.   Facility-Administered Medications Ordered in Other Visits  Medication Dose Route Frequency Provider Last Rate Last Admin   sodium chloride flush (NS) 0.9 % injection 10 mL  10 mL Intracatheter Once Curt Bears, MD        SURGICAL HISTORY:  Past Surgical History:  Procedure Laterality Date   INTERCOSTAL NERVE BLOCK  Right 04/27/2020   Procedure: INTERCOSTAL NERVE BLOCK;  Surgeon: [name], MD;  Location: [facility];  Service: Thoracic;  Laterality: Right;   KNEE CARTILAGE SURGERY Left 1976   pleural tumor Right 01/22/2016   resection of solitary fibrous tumor   RESECTION OF MEDIASTINAL MASS Right 01/23/2016   this is in error- no mediastinal tumor   RESECTION OF MEDIASTINAL MASS Right 11/13/2021   Procedure: RESECTION OF PLEURAL TUMORS;  Surgeon: [name], MD;  Location: [facility] OR;  Service: Thoracic;  Laterality: Right;    THORACOTOMY Right 04/27/2020   Procedure: THORACOTOMY;  Surgeon: [name], MD;  Location: [facility];  Service: Thoracic;  Laterality: Right;   THORACOTOMY Right 11/13/2021   Procedure: REDO THORACOTOMY;  Surgeon: [name], MD;  Location: [facility];  Service: Thoracic;  Laterality: Right;   TONSILLECTOMY     VIDEO BRONCHOSCOPY WITH ENDOBRONCHIAL ULTRASOUND N/A 12/17/2015   Procedure: VIDEO BRONCHOSCOPY WITH ENDOBRONCHIAL ULTRASOUND;  Surgeon: [name], MD;  Location: [facility];  Service: Thoracic;  Laterality: N/A;    REVIEW OF SYSTEMS:  Constitutional: positive for fatigue Eyes: negative Ears, nose, mouth, throat, and face: negative Respiratory: positive for pleurisy/chest pain Cardiovascular: negative Gastrointestinal: negative Genitourinary:negative Integument/breast: negative Hematologic/lymphatic: negative Musculoskeletal:negative Neurological: negative Behavioral/Psych: negative Endocrine: negative Allergic/Immunologic: negative   PHYSICAL EXAMINATION: General appearance: alert, cooperative, fatigued, and no distress Head: Normocephalic, without obvious abnormality, atraumatic Neck: no adenopathy, no JVD, supple, symmetrical, trachea midline, and thyroid not enlarged, symmetric, no tenderness/mass/nodules Lymph nodes: Cervical, supraclavicular, and axillary nodes normal. Resp: clear to auscultation bilaterally Back: symmetric, no curvature. ROM normal. No CVA tenderness. Cardio: regular rate and rhythm, S1, S2 normal, no murmur, click, rub or gallop GI: soft, non-tender; bowel sounds normal; no masses,  no organomegaly Extremities: extremities normal, atraumatic, no cyanosis or edema Neurologic: Alert and oriented X 3, normal strength and tone. Normal symmetric reflexes. Normal coordination and gait  ECOG PERFORMANCE STATUS: 1 - Symptomatic but completely ambulatory  Blood pressure (!) 155/87, pulse 70, temperature 98.1 F (36.7 C), temperature source  Oral, resp. rate 16, weight 177 lb 4.8 oz (80.4 kg), SpO2 99 %.  LABORATORY DATA: Lab Results  Component Value Date   WBC 7.8 06/26/2022   HGB 14.6 06/26/2022   HCT 45.1 06/26/2022   MCV 101.8 (H) 06/26/2022   PLT 146 (L) 06/26/2022      Chemistry      Component Value Date/Time   NA 140 06/26/2022 1054   K 3.8 06/26/2022 1054   CL 106 06/26/2022 1054   CO2 30 06/26/2022 1054   BUN 17 06/26/2022 1054   CREATININE 0.85 06/26/2022 1054      Component Value Date/Time   CALCIUM 9.3 06/26/2022 1054   ALKPHOS 91 06/26/2022 1054   AST 23 06/26/2022 1054   ALT 40 06/26/2022 1054   BILITOT 0.9 06/26/2022 1054       RADIOGRAPHIC STUDIES: No results found.  ASSESSMENT AND PLAN: This is a very pleasant 61 years old male with recurrent solitary fibrous tumor of the right lung diagnosed in June 2017 s/p resection followed by recurrence in October 2021 status post reresection under the care of Dr. Roxan Hockey. The patient has been on observation since his resection.  There was no role for adjuvant therapy for his condition.  He was seen by Dr. Kendall Flack at [facility] who recommended for him observation and consideration of treatment with sunitinib followed by surgery if the patient has disease recurrence He  had repeat CT scan of the chest performed recently.  The scan showed stable disease except for a new 1.4 cm nodular component posteriorly. The patient is currently undergoing treatment with sunitinib 37.5 mg p.o. daily status post 3 months of treatment.  The patient has been tolerating his treatment with Sutent fairly well except for the hypertension. Unfortunately has a scan showed evidence for disease progression with interval development of multiple new pleural-based nodules in the right hemithorax measuring up to 2.9 cm concerning for recurrent/metastatic disease. The patient was seen recently by Dr. Roxan Hockey who offered surgical resection by Dr. Kendall Flack recommended  neoadjuvant treatment with pazopanib for 8 weeks followed by surgical resection if the patient has a stable or improvement of his disease followed by adjuvant treatment with Pazopanib for another 6-8 months. He was treated with pazopanib 800 mg p.o. daily.  He is status post 2 months of treatment. Repeat CT scan showed clear evidence for disease progression with enlarging right pleural-based masses. The patient was referred to Dr. 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  I sent the tissue block to [facility] for molecular studies and PD-L1 expression.  Unfortunately the molecular studies showed no actionable mutations and his PD-L1 expression was negative. The patient was seen recently by Dr. Angelina Ok at [facility] and he received treatment with inpatient systemic chemotherapy with doxorubicin, ifosfamide and mesna every 3 weeks.  Status post 2 cycles.  He had fatigue and pancytopenia with this treatment. His treatment was discontinued secondary to disease progression. Dr. Angelina Ok recommended for the patient treatment with Temodar 200 Mg/M2 on days 1-7 and 15-21 in addition to Avastin 5 Mg/M2 on days 8 and 22 every 4 weeks.  Status post 3 cycles. The patient has been tolerating this treatment fairly well. I recommended for him to proceed with day 8 of cycle #4 today as planned. He will come back for follow-up visit in 2 weeks for evaluation before starting day 22 of cycle #4. For the recently diagnosed prostate adenocarcinoma, he will follow with [facility] for discussion of his treatment options which may include hormonal therapy, surgery or radiation. The patient was advised to call immediately if he has any other concerning symptoms in the  interval. The patient voices understanding of current disease status and treatment options and is in agreement with the current care plan.  All questions were answered. The patient knows to call the clinic with any problems, questions or concerns. We can certainly see the patient much sooner if necessary.  The total time spent in the appointment was 30 minutes.  Disclaimer: This note was dictated with voice recognition software. Similar sounding words can inadvertently be transcribed and may not be corrected upon review.

## 2022-07-31 NOTE — Progress Notes (Signed)
OK to treat without CMP results per Dr. Julien Nordmann.  Angie Fava, RN

## 2022-07-31 NOTE — Patient Instructions (Signed)
[facility]  Discharge Instructions: Thank you for choosing [facility] to provide your oncology and hematology care.   If you have a lab appointment with the [facility], please go directly to the [facility] and check in at the registration area.   Wear comfortable clothing and clothing appropriate for easy access to any Portacath or PICC line.   We strive to give you quality time with your provider. You may need to reschedule your appointment if you arrive late (15 or more minutes).  Arriving late affects you and other patients whose appointments are after yours.  Also, if you miss three or more appointments without notifying the office, you may be dismissed from the clinic at the provider's discretion.      For prescription refill requests, have your pharmacy contact our office and allow 72 hours for refills to be completed.    Today you received the following chemotherapy and/or immunotherapy agents: Mvasi      To help prevent nausea and vomiting after your treatment, we encourage you to take your nausea medication as directed.  BELOW ARE SYMPTOMS THAT SHOULD BE REPORTED IMMEDIATELY: *FEVER GREATER THAN 100.4 F (38 C) OR HIGHER *CHILLS OR SWEATING *NAUSEA AND VOMITING THAT IS NOT CONTROLLED WITH YOUR NAUSEA MEDICATION *UNUSUAL SHORTNESS OF BREATH *UNUSUAL BRUISING OR BLEEDING *URINARY PROBLEMS (pain or burning when urinating, or frequent urination) *BOWEL PROBLEMS (unusual diarrhea, constipation, pain near the anus) TENDERNESS IN MOUTH AND THROAT WITH OR WITHOUT PRESENCE OF ULCERS (sore throat, sores in mouth, or a toothache) UNUSUAL RASH, SWELLING OR PAIN  UNUSUAL VAGINAL DISCHARGE OR ITCHING   Items with * indicate a potential emergency and should be followed up as soon as possible or go to the Emergency Department if any problems should occur.  Please show the CHEMOTHERAPY ALERT CARD or IMMUNOTHERAPY ALERT CARD at check-in  to the Emergency Department and triage nurse.  Should you have questions after your visit or need to cancel or reschedule your appointment, please contact [facility]  Dept: [phone]  and follow the prompts.  Office hours are 8:00 a.m. to 4:30 p.m. Monday - Friday. Please note that voicemails left after 4:00 p.m. may not be returned until the following business day.  We are closed weekends and major holidays. You have access to a nurse at all times for urgent questions. Please call the main number to the clinic Dept: [phone] and follow the prompts.   For any non-urgent questions, you may also contact your provider using MyChart. We now offer e-Visits for anyone 72 and older to request care online for non-urgent symptoms. For details visit [email or web address].   Also download the MyChart app! Go to the app store, search "MyChart", open the app, select [facility], and log in with your MyChart username and password.

## 2022-08-01 ENCOUNTER — Other Ambulatory Visit: Payer: BC Managed Care – PPO

## 2022-08-01 ENCOUNTER — Other Ambulatory Visit: Payer: Self-pay

## 2022-08-04 ENCOUNTER — Other Ambulatory Visit: Payer: Self-pay

## 2022-08-06 ENCOUNTER — Ambulatory Visit: Payer: BC Managed Care – PPO

## 2022-08-06 ENCOUNTER — Ambulatory Visit: Payer: BC Managed Care – PPO | Admitting: Internal Medicine

## 2022-08-06 ENCOUNTER — Ambulatory Visit: Payer: BC Managed Care – PPO | Admitting: Physician Assistant

## 2022-08-06 ENCOUNTER — Other Ambulatory Visit: Payer: BC Managed Care – PPO

## 2022-08-07 ENCOUNTER — Encounter: Payer: Self-pay | Admitting: Internal Medicine

## 2022-08-07 ENCOUNTER — Ambulatory Visit: Payer: BC Managed Care – PPO | Admitting: Urology

## 2022-08-07 ENCOUNTER — Inpatient Hospital Stay: Payer: BC Managed Care – PPO

## 2022-08-07 ENCOUNTER — Inpatient Hospital Stay: Payer: BC Managed Care – PPO | Admitting: Physician Assistant

## 2022-08-12 NOTE — Progress Notes (Unsigned)
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  PRIOR THERAPY: 1) status post resection and the patient has recurrence and October 2021 status post resection again under the care of Dr. Roxan Hockey. 2) Sunitinib 3 7.5 mg p.o. daily.  First dose started April 08, 2021.  Status post 3 months of treatment.  This treatment was discontinued secondary to disease progression. 3) Votrient (pazopanib) 800 mg p.o. daily.  Started August 10, 2021.  Status post 2 months of treatment discontinued secondary to disease progression. 4) status post redo right thoracotomy with resection of pleural tumors and lymph node dissection under the care of Dr. Roxan Hockey on Nov 13, 2021. 5) Systemic chemotherapy with doxorubicin 25 Mg/M2, ifosfamide 2500 Mg/M2 and mesna 2500 Mg/M2 days 1-3 every 3 weeks.  First dose February 12, 2022 at [facility].  Status post 2 cycles of treatment discontinued secondary to disease progression.    CURRENT THERAPY: He is expected to start treatment with systemic chemotherapy with Temodar 200 Mg/M2 on days 1-7 and day 15-21 with a Avastin 5 Mg/KG on days 8 and 22 every 4 weeks.  Status post 3 cycles   INTERVAL HISTORY: Mathew [name] 62 y.o. male returns to the clinic today for a follow-up visit accompanied by his wife.  In summary, the patient is currently undergoing treatment for his fibrous tumor of the lung by our clinic with Avastin IV every 2 weeks and Temodar bimonthly.  He is overall tolerating this well.  The patient also was recently diagnosed with prostate cancer after he had a biopsy in January 2024.  [facility]  He states he is not sure what the plan  is.  He is thinking about transferring his care to [facility] since it may be easier since his other providers are in [place].  He was previously seeing Dr. Governor Rooks.  The patient is concerned that he will be precluded from a clinical trial at [facility] with his prostate cancer diagnosis.  He is also following with Dr. Angelina Ok at [facility].  He scheduled to see him next week with a repeat CT scan.  Otherwise the patient is feeling well today.  He denies any fever, chills, or night sweats.  He reports overall he feels like over the last 8 weeks or so his breathing has been getting better.  He denies any unusual cough, hemoptysis, and any unusual chest pain unless he is lifting weights.  Denies any abnormal bleeding or bruising.  He reports he had an episode of nausea and vomiting with his Temodar dosing earlier this month.  He has an antiemetic at home although it is not currently on his med list.  He is here today for evaluation repeat blood work before undergoing day 22 cycle #4.   MEDICAL HISTORY: Past Medical History:  Diagnosis Date   Arthritis    osteo   Asthma    asthma as a child   Clubbing of nails    Deafness in left ear    GERD (gastroesophageal reflux disease)    History of kidney stones    Metastatic sarcoma (HCC)    Pneumonia    Shortness of breath dyspnea    just initially  Vitamin D deficiency     ALLERGIES:  is allergic to aspirin and penicillins.  MEDICATIONS:  Current Outpatient Medications  Medication Sig Dispense Refill   amLODipine (NORVASC) 10 MG tablet Take 20 mg by mouth daily.     predniSONE (DELTASONE) 5 MG tablet Take 5 mg by mouth 2 (two) times daily. Take 5 mg in the morning and 5 mg in the evening by mouth.     temozolomide (TEMODAR) 100 MG capsule Take 100 mg by mouth as directed. Take 3 capsules ('300mg'$ ) nightly by mouth days 1-7 & 15-21. Off days 8-14 & 22-28.     No current facility-administered medications for this visit.     SURGICAL HISTORY:  Past Surgical History:  Procedure Laterality Date   INTERCOSTAL NERVE BLOCK Right 04/27/2020   Procedure: INTERCOSTAL NERVE BLOCK;  Surgeon: [name], MD;  Location: [facility];  Service: Thoracic;  Laterality: Right;   KNEE CARTILAGE SURGERY Left 1976   pleural tumor Right 01/22/2016   resection of solitary fibrous tumor   RESECTION OF MEDIASTINAL MASS Right 01/23/2016   this is in error- no mediastinal tumor   RESECTION OF MEDIASTINAL MASS Right 11/13/2021   Procedure: RESECTION OF PLEURAL TUMORS;  Surgeon: [name], MD;  Location: [facility];  Service: Thoracic;  Laterality: Right;   THORACOTOMY Right 04/27/2020   Procedure: THORACOTOMY;  Surgeon: [name], MD;  Location: [facility];  Service: Thoracic;  Laterality: Right;   THORACOTOMY Right 11/13/2021   Procedure: REDO THORACOTOMY;  Surgeon: [name], MD;  Location: [facility];  Service: Thoracic;  Laterality: Right;   TONSILLECTOMY     VIDEO BRONCHOSCOPY WITH ENDOBRONCHIAL ULTRASOUND N/A 12/17/2015   Procedure: VIDEO BRONCHOSCOPY WITH ENDOBRONCHIAL ULTRASOUND;  Surgeon: [name], MD;  Location: [facility];  Service: Thoracic;  Laterality: N/A;    REVIEW OF SYSTEMS:   Constitutional:  Negative for appetite change, chills, fever and unexpected weight change.  HENT: Negative for mouth sores, nosebleeds, sore throat and trouble swallowing.  Eyes: Negative for eye problems and icterus.  Respiratory: Positive for improving dyspnea on exertion. Negative for hemoptysis and wheezing.   Cardiovascular: Positive for improving but persistent lower extremity swelling bilaterally and right-sided chest discomfort. Gastrointestinal: Negative for abdominal pain, constipation, diarrhea, nausea (none since earlier this month) and vomiting.  Genitourinary: Negative for bladder incontinence, difficulty urinating, dysuria, frequency and hematuria.   Musculoskeletal: Negative for back pain, gait  problem, neck pain and neck stiffness.  Skin: Negative for itching and rash.  Neurological: Negative for dizziness, extremity weakness, gait problem, headaches, light-headedness and seizures.  Hematological: Negative for adenopathy. Does not bruise/bleed easily.  Psychiatric/Behavioral: Negative for confusion, depression and sleep disturbance. The patient is not nervous/anxious.        PHYSICAL EXAMINATION:  Blood pressure (!) 145/86, pulse 78, temperature (!) 97.2 F (36.2 C), resp. rate 20, weight 171 lb 6.4 oz (77.7 kg), SpO2 97 %.  ECOG PERFORMANCE STATUS: 1  Physical Exam  Constitutional: Oriented to person, place, and time and well-developed, well-nourished, and in no distress.  HENT:  Head: Normocephalic and atraumatic.  Mouth/Throat: Oropharynx is clear and moist. No oropharyngeal exudate.  Eyes: Conjunctivae are normal. Right eye exhibits no discharge. Left eye exhibits no discharge. No scleral icterus.  Neck: Normal range of motion. Neck supple.  Cardiovascular: Normal rate, regular rhythm, normal heart sounds and intact distal pulses.   Pulmonary/Chest: Effort normal.  Quiet breath sounds in the right lung.  No respiratory distress. No  wheezes. No rales.  Abdominal: Soft. Bowel sounds are normal. Exhibits no distension and no mass. There is no tenderness.  Musculoskeletal: Normal range of motion. Bilateral mild lower extremity (L>R due to prior L knee surgery) (improved compared to prior). Lymphadenopathy:    No cervical adenopathy.  Neurological: Alert and oriented to person, place, and time. Exhibits normal muscle tone. Gait normal. Coordination normal.  Skin: Skin is warm and dry. No rash noted. Not diaphoretic. No erythema. No pallor.  Psychiatric: Mood, memory and judgment normal.  Vitals reviewed.  LABORATORY DATA: Lab Results  Component Value Date   WBC 6.8 08/14/2022   HGB 15.5 08/14/2022   HCT 46.0 08/14/2022   MCV 97.7 08/14/2022   PLT 172 08/14/2022       Chemistry      Component Value Date/Time   NA 141 08/14/2022 1014   K 4.1 08/14/2022 1014   CL 106 08/14/2022 1014   CO2 31 08/14/2022 1014   BUN 16 08/14/2022 1014   CREATININE 0.87 08/14/2022 1014      Component Value Date/Time   CALCIUM 8.7 (L) 08/14/2022 1014   ALKPHOS 94 08/14/2022 1014   AST 20 08/14/2022 1014   ALT 23 08/14/2022 1014   BILITOT 0.9 08/14/2022 1014       RADIOGRAPHIC STUDIES:  No results found.   ASSESSMENT/PLAN:  This is a very pleasant 62 year old male with  1) recurrent solitary fibrous tumor of the right lung.  This was initially diagnosed in June 2017.  2) prostate adenocarcinoma with Gleason score of 7 (4+3) diagnosed in February 2024 followed by urology.    He is status post resection.  He had recurrence in October 2021 and underwent re-resection under the care of Dr. Roxan Hockey.  He had been on observation as there is no role for adjuvant therapy for his condition.  He was seen by Dr. Kendall Flack at [facility] who recommended observation and consideration of treatment with Sunitinib followed by surgery if the patient had disease recurrence.    The patient had a new 1.4 cm nodule posteriorly and the patient underwent treatment with sunitinib 37.5 mg p.o. daily.  He was on this for 3 months. He had been tolerating this fairly well except for hypertension.  Unfortunately scan showed evidence of disease progression with interval development of multiple new pleural-based nodules in the right hemithorax measuring up to 2.9 cm which is concerning for recurrent/metastatic disease.    He was then seen by Dr. Roxan Hockey who offered surgical resection.  Dr. Kendall Flack recommended neoadjuvant treatment with pazopanib for 8 weeks followed by surgical resection if he had improvement in his disease followed by continued adjuvant treatment for another 6 to 8 months.  Unfortunately, after 2 months of treatment the CT scan showed clear evidence of disease  progression with enlarging right pleural-based masses.  He was referred to Dr. 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  Molecular studies by foundation 1 and PD-L1 did not show any actionable mutations and his PD-L1 expression was negative.    The patient then saw Dr. Angelina Ok at [facility] who recommended inpatient systemic chemotherapy with doxorubicin, ifosfamide, and mesna IV every 3 weeks.  He had a 2D echo as well as a Port-A-Cath placed.  This was discontinued due to evidence of  disease progression.    Dr. Angelina Ok recommended for the patient treatment with Temodar 200 Mg/M2 on days 1-7 and 15-21 in addition to Avastin 5 Mg/M2 on days 8 and 22 every 4 weeks.  He is here today for day 8 cycle 3.   The patient is scheduled to see Dr. Angelina Ok for a repeat CT scan and follow-up next week.  Labs reviewed.  Recommend he proceed with day 22 cycle 4 scheduled.  I discussed the patient's case with Dr. Julien Nordmann today.  Dr. Julien Nordmann and I encouraged the patient to follow-up with his urologist about what the plan is regarding his prostate cancer.  The patient is interested in seeing one of their partners here in [place].  Since both of these offices are [facility], where they transfer from 1 practice to the other will be smoother.  They are going to call the urologist today.  He may be a good candidate to be discussed at the multidisciplinary conference.   Dr. Julien Nordmann has not heard from the provider Dr. Volanda Napoleon at [facility].  Dr. Julien Nordmann states he would be happy to talk to her about Mr. Hyland if needed.  The patient's wife is going to send [place] a MyChart message later with the number to their clinic  Since the patient will have a restaging CT  scan next week, I will ask the scheduling team to make a follow-up visit with me or Dr. Julien Nordmann when he comes back in 2 weeks for his next infusion.   The patient was advised to call immediately if he has any concerning symptoms in the interval. The patient voices understanding of current disease status and treatment options and is in agreement with the current care plan. All questions were answered. The patient knows to call the clinic with any problems, questions or concerns. We can certainly see the patient much sooner if necessary   No orders of the defined types were placed in this encounter.   The total time spent in the appointment was 20-29 minutes  Aven Cegielski L Maye Parkinson, PA-C 08/14/22

## 2022-08-13 ENCOUNTER — Other Ambulatory Visit: Payer: Self-pay

## 2022-08-14 ENCOUNTER — Inpatient Hospital Stay (HOSPITAL_BASED_OUTPATIENT_CLINIC_OR_DEPARTMENT_OTHER): Payer: BC Managed Care – PPO | Admitting: Physician Assistant

## 2022-08-14 ENCOUNTER — Inpatient Hospital Stay: Payer: BC Managed Care – PPO

## 2022-08-14 ENCOUNTER — Other Ambulatory Visit: Payer: Self-pay

## 2022-08-14 ENCOUNTER — Encounter: Payer: Self-pay | Admitting: Internal Medicine

## 2022-08-14 VITALS — BP 145/86 | HR 78 | Temp 97.2°F | Resp 20 | Wt 171.4 lb

## 2022-08-14 VITALS — BP 132/75

## 2022-08-14 DIAGNOSIS — Z5111 Encounter for antineoplastic chemotherapy: Secondary | ICD-10-CM

## 2022-08-14 DIAGNOSIS — D492 Neoplasm of unspecified behavior of bone, soft tissue, and skin: Secondary | ICD-10-CM | POA: Diagnosis not present

## 2022-08-14 DIAGNOSIS — C3491 Malignant neoplasm of unspecified part of right bronchus or lung: Secondary | ICD-10-CM | POA: Diagnosis not present

## 2022-08-14 DIAGNOSIS — R918 Other nonspecific abnormal finding of lung field: Secondary | ICD-10-CM

## 2022-08-14 DIAGNOSIS — Z95828 Presence of other vascular implants and grafts: Secondary | ICD-10-CM

## 2022-08-14 DIAGNOSIS — D649 Anemia, unspecified: Secondary | ICD-10-CM

## 2022-08-14 LAB — TOTAL PROTEIN, URINE DIPSTICK: Protein, ur: NEGATIVE mg/dL

## 2022-08-14 LAB — CBC WITH DIFFERENTIAL (CANCER CENTER ONLY)
Abs Immature Granulocytes: 0.07 10*3/uL (ref 0.00–0.07)
Basophils Absolute: 0 10*3/uL (ref 0.0–0.1)
Basophils Relative: 0 %
Eosinophils Absolute: 0 10*3/uL (ref 0.0–0.5)
Eosinophils Relative: 0 %
HCT: 46 % (ref 39.0–52.0)
Hemoglobin: 15.5 g/dL (ref 13.0–17.0)
Immature Granulocytes: 1 %
Lymphocytes Relative: 7 %
Lymphs Abs: 0.5 10*3/uL — ABNORMAL LOW (ref 0.7–4.0)
MCH: 32.9 pg (ref 26.0–34.0)
MCHC: 33.7 g/dL (ref 30.0–36.0)
MCV: 97.7 fL (ref 80.0–100.0)
Monocytes Absolute: 0.6 10*3/uL (ref 0.1–1.0)
Monocytes Relative: 8 %
Neutro Abs: 5.7 10*3/uL (ref 1.7–7.7)
Neutrophils Relative %: 84 %
Platelet Count: 172 10*3/uL (ref 150–400)
RBC: 4.71 MIL/uL (ref 4.22–5.81)
RDW: 12.8 % (ref 11.5–15.5)
WBC Count: 6.8 10*3/uL (ref 4.0–10.5)
nRBC: 0 % (ref 0.0–0.2)

## 2022-08-14 LAB — CMP (CANCER CENTER ONLY)
ALT: 23 U/L (ref 0–44)
AST: 20 U/L (ref 15–41)
Albumin: 3.7 g/dL (ref 3.5–5.0)
Alkaline Phosphatase: 94 U/L (ref 38–126)
Anion gap: 4 — ABNORMAL LOW (ref 5–15)
BUN: 16 mg/dL (ref 8–23)
CO2: 31 mmol/L (ref 22–32)
Calcium: 8.7 mg/dL — ABNORMAL LOW (ref 8.9–10.3)
Chloride: 106 mmol/L (ref 98–111)
Creatinine: 0.87 mg/dL (ref 0.61–1.24)
GFR, Estimated: >60 mL/min (ref >60)
Glucose, Bld: 118 mg/dL — ABNORMAL HIGH (ref 70–99)
Potassium: 4.1 mmol/L (ref 3.5–5.1)
Sodium: 141 mmol/L (ref 135–145)
Total Bilirubin: 0.9 mg/dL (ref 0.3–1.2)
Total Protein: 6.1 g/dL — ABNORMAL LOW (ref 6.5–8.1)

## 2022-08-14 MED ORDER — SODIUM CHLORIDE 0.9% FLUSH
10.0000 mL | Freq: Once | INTRAVENOUS | Status: AC
  Administered 2022-08-14: 10 mL

## 2022-08-14 MED ORDER — SODIUM CHLORIDE 0.9% FLUSH
10.0000 mL | INTRAVENOUS | Status: DC | PRN
  Administered 2022-08-14: 10 mL

## 2022-08-14 MED ORDER — SODIUM CHLORIDE 0.9 % IV SOLN: Freq: Once | INTRAVENOUS | Status: AC

## 2022-08-14 MED ORDER — SODIUM CHLORIDE 0.9 % IV SOLN
5.0000 mg/kg | Freq: Once | INTRAVENOUS | Status: AC
  Administered 2022-08-14: 400 mg via INTRAVENOUS
  Filled 2022-08-14: qty 16

## 2022-08-14 MED ORDER — HEPARIN SOD (PORK) LOCK FLUSH 100 UNIT/ML IV SOLN
500.0000 [IU] | Freq: Once | INTRAVENOUS | Status: AC | PRN
  Administered 2022-08-14: 500 [IU]

## 2022-08-14 NOTE — Patient Instructions (Signed)
[facility]  Discharge Instructions: Thank you for choosing [facility] to provide your oncology and hematology care.   If you have a lab appointment with the [facility], please go directly to the [facility] and check in at the registration area.   Wear comfortable clothing and clothing appropriate for easy access to any Portacath or PICC line.   We strive to give you quality time with your provider. You may need to reschedule your appointment if you arrive late (15 or more minutes).  Arriving late affects you and other patients whose appointments are after yours.  Also, if you miss three or more appointments without notifying the office, you may be dismissed from the clinic at the provider's discretion.      For prescription refill requests, have your pharmacy contact our office and allow 72 hours for refills to be completed.    Today you received the following chemotherapy and/or immunotherapy agent: Bevacizumab (MVASI)   To help prevent nausea and vomiting after your treatment, we encourage you to take your nausea medication as directed.  BELOW ARE SYMPTOMS THAT SHOULD BE REPORTED IMMEDIATELY: *FEVER GREATER THAN 100.4 F (38 C) OR HIGHER *CHILLS OR SWEATING *NAUSEA AND VOMITING THAT IS NOT CONTROLLED WITH YOUR NAUSEA MEDICATION *UNUSUAL SHORTNESS OF BREATH *UNUSUAL BRUISING OR BLEEDING *URINARY PROBLEMS (pain or burning when urinating, or frequent urination) *BOWEL PROBLEMS (unusual diarrhea, constipation, pain near the anus) TENDERNESS IN MOUTH AND THROAT WITH OR WITHOUT PRESENCE OF ULCERS (sore throat, sores in mouth, or a toothache) UNUSUAL RASH, SWELLING OR PAIN  UNUSUAL VAGINAL DISCHARGE OR ITCHING   Items with * indicate a potential emergency and should be followed up as soon as possible or go to the Emergency Department if any problems should occur.  Please show the CHEMOTHERAPY ALERT CARD or IMMUNOTHERAPY ALERT CARD at  check-in to the Emergency Department and triage nurse.  Should you have questions after your visit or need to cancel or reschedule your appointment, please contact [facility]  Dept: [phone]  and follow the prompts.  Office hours are 8:00 a.m. to 4:30 p.m. Monday - Friday. Please note that voicemails left after 4:00 p.m. may not be returned until the following business day.  We are closed weekends and major holidays. You have access to a nurse at all times for urgent questions. Please call the main number to the clinic Dept: [phone] and follow the prompts.   For any non-urgent questions, you may also contact your provider using MyChart. We now offer e-Visits for anyone 47 and older to request care online for non-urgent symptoms. For details visit [email or web address].   Also download the MyChart app! Go to the app store, search "MyChart", open the app, select [facility], and log in with your MyChart username and password.  Bevacizumab Injection What is this medication? BEVACIZUMAB (be va SIZ yoo mab) treats some types of cancer. It works by blocking a protein that causes cancer cells to grow and multiply. This helps to slow or stop the spread of cancer cells. It is a monoclonal antibody. This medicine may be used for other purposes; ask your health care provider or pharmacist if you have questions. COMMON BRAND NAME(S): Alymsys, Avastin, MVASI, Noah Charon What should I tell my care team before I take this medication? They need to know if you have any of these conditions: Blood clots Coughing up blood Having or recent surgery Heart failure High  blood pressure History of a connection between 2 or more body parts that do not usually connect (fistula) History of a tear in your stomach or intestines Protein in your urine An unusual or allergic reaction to bevacizumab, other medications, foods, dyes, or preservatives Pregnant or trying to get  pregnant Breast-feeding How should I use this medication? This medication is injected into a vein. It is given by your care team in a hospital or clinic setting. Talk to your care team the use of this medication in children. Special care may be needed. Overdosage: If you think you have taken too much of this medicine contact a poison control center or emergency room at once. NOTE: This medicine is only for you. Do not share this medicine with others. What if I miss a dose? Keep appointments for follow-up doses. It is important not to miss your dose. Call your care team if you are unable to keep an appointment. What may interact with this medication? Interactions are not expected. This list may not describe all possible interactions. Give your health care provider a list of all the medicines, herbs, non-prescription drugs, or dietary supplements you use. Also tell them if you smoke, drink alcohol, or use illegal drugs. Some items may interact with your medicine. What should I watch for while using this medication? Your condition will be monitored carefully while you are receiving this medication. You may need blood work while taking this medication. This medication may make you feel generally unwell. This is not uncommon as chemotherapy can affect healthy cells as well as cancer cells. Report any side effects. Continue your course of treatment even though you feel ill unless your care team tells you to stop. This medication may increase your risk to bruise or bleed. Call your care team if you notice any unusual bleeding. Before having surgery, talk to your care team to make sure it is ok. This medication can increase the risk of poor healing of your surgical site or wound. You will need to stop this medication for 28 days before surgery. After surgery, wait at least 28 days before restarting this medication. Make sure the surgical site or wound is healed enough before restarting this medication. Talk  to your care team if questions. Talk to your care team if you may be pregnant. Serious birth defects can occur if you take this medication during pregnancy and for 6 months after the last dose. Contraception is recommended while taking this medication and for 6 months after the last dose. Your care team can help you find the option that works for you. Do not breastfeed while taking this medication and for 6 months after the last dose. This medication can cause infertility. Talk to your care team if you are concerned about your fertility. What side effects may I notice from receiving this medication? Side effects that you should report to your care team as soon as possible: Allergic reactions--skin rash, itching, hives, swelling of the face, lips, tongue, or throat Bleeding--bloody or black, tar-like stools, vomiting blood or brown material that looks like coffee grounds, red or dark brown urine, small red or purple spots on skin, unusual bruising or bleeding Blood clot--pain, swelling, or warmth in the leg, shortness of breath, chest pain Heart attack--pain or tightness in the chest, shoulders, arms, or jaw, nausea, shortness of breath, cold or clammy skin, feeling faint or lightheaded Heart failure--shortness of breath, swelling of the ankles, feet, or hands, sudden weight gain, unusual weakness or fatigue Increase   in blood pressure Infection--fever, chills, cough, sore throat, wounds that don't heal, pain or trouble when passing urine, general feeling of discomfort or being unwell Infusion reactions--chest pain, shortness of breath or trouble breathing, feeling faint or lightheaded Kidney injury--decrease in the amount of urine, swelling of the ankles, hands, or feet Stomach pain that is severe, does not go away, or gets worse Stroke--sudden numbness or weakness of the face, arm, or leg, trouble speaking, confusion, trouble walking, loss of balance or coordination, dizziness, severe headache,  change in vision Sudden and severe headache, confusion, change in vision, seizures, which may be signs of posterior reversible encephalopathy syndrome (PRES) Side effects that usually do not require medical attention (report to your care team if they continue or are bothersome): Back pain Change in taste Diarrhea Dry skin Increased tears Nosebleed This list may not describe all possible side effects. Call your doctor for medical advice about side effects. You may report side effects to FDA at 1-800-FDA-1088. Where should I keep my medication? This medication is given in a hospital or clinic. It will not be stored at home. NOTE: This sheet is a summary. It may not cover all possible information. If you have questions about this medicine, talk to your doctor, pharmacist, or health care provider.  2023 Elsevier/Gold Standard (2021-10-04 00:00:00)

## 2022-08-15 ENCOUNTER — Encounter: Payer: Self-pay | Admitting: Medical Oncology

## 2022-08-21 ENCOUNTER — Other Ambulatory Visit: Payer: BC Managed Care – PPO

## 2022-08-21 ENCOUNTER — Ambulatory Visit: Payer: BC Managed Care – PPO

## 2022-08-21 ENCOUNTER — Ambulatory Visit: Payer: BC Managed Care – PPO | Admitting: Internal Medicine

## 2022-08-21 ENCOUNTER — Ambulatory Visit: Payer: BC Managed Care – PPO | Admitting: Physician Assistant

## 2022-08-22 NOTE — Progress Notes (Addendum)
Radiation Oncology         [phone] ________________________________  Initial Outpatient Consultation  Name: Mathew [name] MRN: [id number]  Date: 08/25/2022  DOB: 01/18/1961  WU:[id number], Cala Bradford, MD  Si Gaul, MD   REFERRING PHYSICIAN: Si Gaul, MD  DIAGNOSIS: 62 y.o. gentleman with Stage T1c adenocarcinoma of the prostate with Gleason score of 4+3, and PSA of 10.2.    ICD-10-CM   1. Malignant neoplasm of prostate (HCC)  C61       HISTORY OF PRESENT ILLNESS: Mathew [name] is a 62 y.o. 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 He was noted to have an elevated PSA of 2.1 in October of 2020, 3.8 in October 2021, 5 in October 2020, 5.4 in January 2023, and 8.1 in April 2023. Patient's urologist, Dr. Richardo Hanks opted for a prostate MRI at this time. MRI on 01/10/22 revealed a 24 cc gland with a 2 cm PI-RADS category 5 lesion concerning for prostate cancer in the right posterior lateral peripheral zone in the mid gland and apex. Subsequent PSA on 06/24/22 continued to rise at 10.20. The patient was referred to [facility] for an MRI fusion biopsy. Patient proceeded to transrectal ultrasound with 16 biopsies of the prostate on 07/16/22 under the care of Dr. Arita Miss.  The prostate volume measured 23 cc.  Out of 16 core biopsies, 10 were positive.  The maximum Gleason score was 4+3, and this was seen in right base. Additionally, samples with a gleason score of 3+4 were seen in four cores of the MRI lesion sampled, right mid, right apex, right base lateral, right mid lateral, and right apex lateral. Perineural invasion was identified in the right base, right apex, and right mid lateral.   Patient is being seen by Dr. 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 Patient was originally treated with surgery. Tumor recurred and patient underwent resection  again in October of 2021 under the care of Dr. Dorris Fetch. Patient was placed on  Sunitinib in October of 2022. He completed 3 months of treatment but stopped due to disease progression. He was then started on Votrient in February of 2023. He completed 2 months of treatment until he discontinued it due to disease progression. Patient underwent a right thoracotomy with resection of pleural tumors and lymph node dissection in May of 2023 under the care of Dr. Dorris Fetch. Patient was started on systemic chemotherapy in August of 2023 , but stopped after 2 cycles of treatment due to disease progression. He is currently receiving systemic chemotherapy with Temodar and Avastin under the care of Dr. Arbutus Ped.   The patient reviewed the biopsy results with his urologist and he has kindly been referred today for discussion of potential radiation treatment options.   PREVIOUS RADIATION THERAPY: No  PAST MEDICAL HISTORY:  Past Medical History:  Diagnosis Date   Arthritis    osteo   Asthma    asthma as a child   Clubbing of nails    Deafness in left ear    GERD (gastroesophageal reflux disease)    History of kidney stones    Metastatic sarcoma (HCC)    Pneumonia    Shortness of breath dyspnea    just initially   Vitamin D deficiency       PAST SURGICAL HISTORY: Past Surgical History:  Procedure Laterality Date   INTERCOSTAL NERVE BLOCK Right 04/27/2020  Procedure: INTERCOSTAL NERVE BLOCK;  Surgeon: [name], MD;  Location: [place];  Service: Thoracic;  Laterality: Right;   KNEE CARTILAGE SURGERY Left 1976   pleural tumor Right 01/22/2016   resection of solitary fibrous tumor   RESECTION OF MEDIASTINAL MASS Right 01/23/2016   this is in error- no mediastinal tumor   RESECTION OF MEDIASTINAL MASS Right 11/13/2021   Procedure: RESECTION OF PLEURAL TUMORS;  Surgeon: [name], MD;  Location: [facility];  Service: Thoracic;  Laterality: Right;   THORACOTOMY Right 04/27/2020    Procedure: THORACOTOMY;  Surgeon: [name], MD;  Location: [place];  Service: Thoracic;  Laterality: Right;   THORACOTOMY Right 11/13/2021   Procedure: REDO THORACOTOMY;  Surgeon: [name], MD;  Location: [place];  Service: Thoracic;  Laterality: Right;   TONSILLECTOMY     VIDEO BRONCHOSCOPY WITH ENDOBRONCHIAL ULTRASOUND N/A 12/17/2015   Procedure: VIDEO BRONCHOSCOPY WITH ENDOBRONCHIAL ULTRASOUND;  Surgeon: [name], MD;  Location: [place];  Service: Thoracic;  Laterality: N/A;    FAMILY HISTORY:  Family History  Problem Relation Age of Onset   Diabetes type II Mother    Hypertension Father     SOCIAL HISTORY:  Social History   Socioeconomic History   Marital status: Married    Spouse name: Coralyn Mark   Number of children: 3   Years of education: Not on file   Highest education level: Not on file  Occupational History   Not on file  Tobacco Use   Smoking status: Never   Smokeless tobacco: Former    Types: Chew    Quit date: 06/16/1980  Vaping Use   Vaping Use: Never used  Substance and Sexual Activity   Alcohol use: Yes    Alcohol/week: 0.0 standard drinks of alcohol    Comment: 3- 5 drinks q month   Drug use: Not Currently   Sexual activity: Yes  Other Topics Concern   Not on file  Social History Narrative   Not on file   Social Determinants of Health   Financial Resource Strain: Not on file  Food Insecurity: No Food Insecurity (08/25/2022)   Hunger Vital Sign    Worried About Running Out of Food in the Last Year: Never true    Ran Out of Food in the Last Year: Never true  Transportation Needs: No Transportation Needs (08/25/2022)   PRAPARE - Hydrologist (Medical): No    Lack of Transportation (Non-Medical): No  Physical Activity: Not on file  Stress: Not on file  Social Connections: Not on file  Intimate Partner Violence: Not At Risk (08/25/2022)   Humiliation, Afraid, Rape, and Kick questionnaire    Fear of  Current or Ex-Partner: No    Emotionally Abused: No    Physically Abused: No    Sexually Abused: No    ALLERGIES: Aspirin and Penicillins  MEDICATIONS:  Current Outpatient Medications  Medication Sig Dispense Refill   temozolomide (TEMODAR) 100 MG capsule Take 3 capsules ('300mg'$ ) nightly by mouth days 1-7 & 15-21. Off days 8-14 & 22-28.     amLODipine (NORVASC) 10 MG tablet Take 20 mg by mouth daily.     predniSONE (DELTASONE) 5 MG tablet Take 5 mg by mouth 2 (two) times daily. Take 5 mg in the morning and 5 mg in the evening by mouth.     No current facility-administered medications for this encounter.    REVIEW OF SYSTEMS:  On review of systems,  the patient reports that he is doing well overall. He denies any chest pain, shortness of breath, cough, fevers, chills, night sweats, unintended weight changes. He denies any bowel disturbances, and denies abdominal pain, nausea or vomiting. He denies any new musculoskeletal or joint aches or pains. His IPSS was 2, indicating mild urinary symptoms.  His SHIM was 25, indicating he does not have erectile dysfunction. A complete review of systems is obtained and is otherwise negative.   PHYSICAL EXAM:  Wt Readings from Last 3 Encounters:  08/25/22 172 lb (78 kg)  08/14/22 171 lb 6.4 oz (77.7 kg)  07/31/22 177 lb 4.8 oz (80.4 kg)   Temp Readings from Last 3 Encounters:  08/25/22 (!) 97.4 F (36.3 C)  08/14/22 (!) 97.2 F (36.2 C)  07/31/22 98.3 F (36.8 C)   BP Readings from Last 3 Encounters:  08/25/22 130/87  08/14/22 132/75  08/14/22 (!) 145/86   Pulse Readings from Last 3 Encounters:  08/25/22 73  08/14/22 78  07/31/22 75   Pain Assessment Pain Score: 0-No pain/10  In general this is a well appearing gentleman in no acute distress. He's alert and oriented x4 and appropriate throughout the examination. Cardiopulmonary assessment is negative for acute distress, and he exhibits normal effort.    KPS = 100  100 - Normal; no  complaints; no evidence of disease. 90   - Able to carry on normal activity; minor signs or symptoms of disease. 80   - Normal activity with effort; some signs or symptoms of disease. 39   - Cares for self; unable to carry on normal activity or to do active work. 60   - Requires occasional assistance, but is able to care for most of his personal needs. 50   - Requires considerable assistance and frequent medical care. 40   - Disabled; requires special care and assistance. 30   - Severely disabled; hospital admission is indicated although death not imminent. 20   - Very sick; hospital admission necessary; active supportive treatment necessary. 10   - Moribund; fatal processes progressing rapidly. 0     - Dead  Karnofsky DA, Abelmann WH, Craver LS and Burchenal [facility] [phone]) The use of the nitrogen mustards in the palliative treatment of carcinoma: with particular reference to bronchogenic carcinoma Cancer 1 634-56  LABORATORY DATA:  Lab Results  Component Value Date   WBC 6.8 08/14/2022   HGB 15.5 08/14/2022   HCT 46.0 08/14/2022   MCV 97.7 08/14/2022   PLT 172 08/14/2022   Lab Results  Component Value Date   NA 141 08/14/2022   K 4.1 08/14/2022   CL 106 08/14/2022   CO2 31 08/14/2022   Lab Results  Component Value Date   ALT 23 08/14/2022   AST 20 08/14/2022   ALKPHOS 94 08/14/2022   BILITOT 0.9 08/14/2022     RADIOGRAPHY: No results found.    IMPRESSION/PLAN: 1. 62 y.o. gentleman with Stage T1c adenocarcinoma of the prostate with Gleason Score of 4+3, and PSA of 10.2. We discussed the patient's workup and outlined the nature of prostate cancer in this setting. The patient's T stage, Gleason's score, and PSA put him into the unfavorable, intermediate risk group. Accordingly, he is eligible for a variety of potential treatment options including brachytherapy, 5.5-8 weeks of external radiation, or prostatectomy. We discussed the available radiation techniques, and focused on the  details and logistics of delivery. We discussed and outlined the risks, benefits, short and long-term effects associated with radiotherapy  and compared and contrasted these with prostatectomy. We discussed the role of SpaceOAR gel in reducing the rectal toxicity associated with radiotherapy. He appears to have a good understanding of his disease and our treatment recommendations which are of curative intent.  He was encouraged to ask questions that were answered to his stated satisfaction.  Patient is being considered for a research trial if his current therapy for his lung sarcoma is not successful. He recently saw a sarcoma expert at [facility] for consultation on the potential trial who stated she would consider patient for the trial if he undergoes definitive treatment for his prostate cancer. She recommended against a prostatectomy as he would have to discontinue his Avastin therapy for this.   Of note, patient is at an increased risk for delayed wound healing with radioactive seed placement because of his Avastin therapy. He was made aware of this risk and expressed understanding. Patient has also been put under anesthesia multiple times. We explained that he could choose to forego fiducial markers and SpaceOAR to avoid anesthesia if he does decide to proceed with external beam radiotherapy.   He is unsure of which treatment option he would like to proceed with at this time but states he will call with his decision within the next week.   We personally spent 60 minutes in this encounter including chart review, reviewing radiological studies, meeting face-to-face with the patient, entering orders and completing documentation.     Leona Singleton, PA-C    Tyler Pita, MD  [facility] Direct Dial: [phone]  Fax: [phone] [email or web address]  Skype  [email or web address]

## 2022-08-22 NOTE — Progress Notes (Signed)
GU Location of Tumor / Histology: Prostate Ca  If Prostate Cancer, Gleason Score is (4 + 3) and PSA is (10.20 on 06/24/2022)  Biopsies      01/10/2022 Dr. Nickolas Madrid MR Prostate with/without Contrast CLINICAL DATA:  Elevated PSA level of 5.4 on 06/20/2021. R97.20 prostate cancer.  FINDINGS: Prostate: Region of interest # 1: PI-RADS category 5 lesion of the right posterolateral peripheral zone in the mid gland and apex, and right posteromedial peripheral zone in the apex, with abnormal reduced T2 signal (image 15, series 9), low ADC map activity and restricted diffusion (image 18 of series 7 and 8), and focal early enhancement (image 186, series 12). This measures 1.80 cc (2.0 by 1.4 by 1.5 cm).  Hazy low T2 signal is present throughout the peripheral zone, likely postinflammatory and considered PI-RADS category 2.   Mild nodularity in the transition zone.   Volume: 3D volumetric analysis: Prostate volume 23.69 cc (4.2 by 3.5 by 3.5 cm).   Transcapsular spread:  Absent   Seminal vesicle involvement: Absent   Neurovascular bundle involvement: Absent   Pelvic adenopathy: Absent   Bone metastasis: Absent   Other findings: No other significant findings.   IMPRESSION: 1. PI-RADS category 5 lesion in the right peripheral zone. Targeting data sent to [facility].   Past/Anticipated interventions by urology, if any: NA  Past/Anticipated interventions by medical oncology, if any:  NA  Weight changes, if any: {:18581}  IPSS: SHIM:  Bowel/Bladder complaints, if any: {:18581}   Nausea/Vomiting, if any: {:18581}  Pain issues, if any:  {:18581}  SAFETY ISSUES: Prior radiation? {:18581} Pacemaker/ICD? {:18581} Possible current pregnancy? Male Is the patient on methotrexate? No  Current Complaints / other details:  Additional DX: Right lung mass metastasis in the inferolateral RIGHT pleural space measures 10 cm and extends into adjacent intracostal spaces. right pleural space  currently being treated by Dr. Julien Nordmann.

## 2022-08-25 ENCOUNTER — Encounter: Payer: Self-pay | Admitting: Radiation Oncology

## 2022-08-25 ENCOUNTER — Ambulatory Visit
Admission: RE | Admit: 2022-08-25 | Discharge: 2022-08-25 | Disposition: A | Discharge status: Home / Self Care | Payer: BC Managed Care – PPO | Source: Ambulatory Visit | Attending: Radiation Oncology | Admitting: Radiation Oncology

## 2022-08-25 VITALS — BP 130/87 | HR 73 | Temp 97.4°F | Resp 18 | Ht 69.0 in | Wt 172.0 lb

## 2022-08-25 DIAGNOSIS — C61 Malignant neoplasm of prostate: Secondary | ICD-10-CM

## 2022-08-25 DIAGNOSIS — Z87442 Personal history of urinary calculi: Secondary | ICD-10-CM | POA: Diagnosis not present

## 2022-08-25 DIAGNOSIS — Z79630 Long term (current) use of alkylating agent: Secondary | ICD-10-CM | POA: Diagnosis not present

## 2022-08-25 DIAGNOSIS — Z7952 Long term (current) use of systemic steroids: Secondary | ICD-10-CM | POA: Diagnosis not present

## 2022-08-25 DIAGNOSIS — E559 Vitamin D deficiency, unspecified: Secondary | ICD-10-CM | POA: Diagnosis not present

## 2022-08-25 DIAGNOSIS — J45909 Unspecified asthma, uncomplicated: Secondary | ICD-10-CM | POA: Diagnosis not present

## 2022-08-25 DIAGNOSIS — Z87891 Personal history of nicotine dependence: Secondary | ICD-10-CM | POA: Diagnosis not present

## 2022-08-25 DIAGNOSIS — M129 Arthropathy, unspecified: Secondary | ICD-10-CM | POA: Insufficient documentation

## 2022-08-25 DIAGNOSIS — Z79899 Other long term (current) drug therapy: Secondary | ICD-10-CM | POA: Insufficient documentation

## 2022-08-25 DIAGNOSIS — K219 Gastro-esophageal reflux disease without esophagitis: Secondary | ICD-10-CM | POA: Diagnosis not present

## 2022-08-25 NOTE — Progress Notes (Signed)
Introduced myself to the patient as the prostate nurse navigator.  No barriers to care identified at this time.  He is here to discuss his radiation treatment options.  I gave him my business card and asked him to call me with questions or concerns.  Verbalized understanding.  ?

## 2022-08-26 DIAGNOSIS — C61 Malignant neoplasm of prostate: Secondary | ICD-10-CM | POA: Insufficient documentation

## 2022-08-26 NOTE — Progress Notes (Addendum)
[facility] OFFICE PROGRESS NOTE  Mathew Devon, MD 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  PRIOR THERAPY: 1) status post resection and the patient has recurrence and October 2021 status post resection again under the care of Dr. Dorris Fetch. 2) Sunitinib 3 7.5 mg p.o. daily.  First dose started April 08, 2021.  Status post 3 months of treatment.  This treatment was discontinued secondary to disease progression. 3) Votrient (pazopanib) 800 mg p.o. daily.  Started August 10, 2021.  Status post 2 months of treatment discontinued secondary to disease progression. 4) status post redo right thoracotomy with resection of pleural tumors and lymph node dissection under the care of Dr. Dorris Fetch on Nov 13, 2021. 5) Systemic chemotherapy with doxorubicin 25 Mg/M2, ifosfamide 2500 Mg/M2 and mesna 2500 Mg/M2 days 1-3 every 3 weeks.  First dose February 12, 2022 at [facility] cancer center.  Status post 2 cycles of treatment discontinued secondary to disease progression.  CURRENT THERAPY: systemic chemotherapy with Temodar 200 Mg/M2 on days 1-7 and day 15-21 with a Avastin 5 Mg/KG on days 8 and 22 every 4 weeks.  Status post 4 cycles    INTERVAL HISTORY: Mathew [name] 62 y.o. male returns  to the clinic today for a follow-up visit accompanied by his wife. In summary, the patient is currently undergoing treatment for his fibrous tumor of the lung by our clinic with Avastin IV every 2 weeks and Temodar bimonthly.  He is overall tolerating this well.  The patient also was recently diagnosed with prostate cancer after he had a biopsy in January 2024.  He met with Dr. Kathrynn Running earlier this week to discuss his options with prostatectomy, seed implant, or external  radiation.  There is concern about prostatectomy and radioactive seed implant as his Avastin would need to be held.  The patient was still considering his options. The patient is concerned that he will be precluded from a clinical trial at [facility] with his prostate cancer diagnosis. The patient has not heard back from [facility] in [place] yet.   Also in the interval since last being seen, he saw Dr. Waymon Amato at the [facility] cancer center and had a repeat CT scan which showed overall stable subpleural masses, some of which are slightly decreased in size and compared to June 12, 2022 and large majority which are stable. Additionally, there were no new sites of metastatic disease in the chest, abdomen, pelvis.   Otherwise the patient is feeling well today.  He had mild diarrhea 2 days last week with his Temodar.  However resolved without needing to take any Imodium.  Denies any blood in the stool.  He denies any fever, chills, or night sweats.  Overall, his breathing is stable.  He may get intermittent shortness of breath.  He is having a little bit of congestion which he attributes to allergies.  He is not taking an antihistamine. Denies any unusual cough, hemoptysis, or chest pain except when he is lifting weights.  Denies any abnormal bleeding or bruising.  Denies any headache or visual changes.  Denies any nausea or vomiting.  He is here today for evaluation repeat blood work.     MEDICAL HISTORY: Past Medical History:  Diagnosis Date   Arthritis    osteo  Asthma    asthma as a child   Clubbing of nails    Deafness in left ear    GERD (gastroesophageal reflux disease)    History of kidney stones    Metastatic sarcoma (HCC)    Pneumonia    Shortness of breath dyspnea    just initially   Vitamin D deficiency     ALLERGIES:  is allergic to aspirin and penicillins.  MEDICATIONS:  Current Outpatient Medications  Medication Sig Dispense Refill   amLODipine (NORVASC) 10  MG tablet Take 20 mg by mouth daily.     predniSONE (DELTASONE) 5 MG tablet Take 5 mg by mouth daily.     temozolomide (TEMODAR) 100 MG capsule Take 3 capsules (300mg ) nightly by mouth days 1-7 & 15-21. Off days 8-14 & 22-28.     No current facility-administered medications for this visit.    SURGICAL HISTORY:  Past Surgical History:  Procedure Laterality Date   INTERCOSTAL NERVE BLOCK Right 04/27/2020   Procedure: INTERCOSTAL NERVE BLOCK;  Surgeon: Loreli Slot, MD;  Location: [facility] OR;  Service: Thoracic;  Laterality: Right;   KNEE CARTILAGE SURGERY Left 1976   pleural tumor Right 01/22/2016   resection of solitary fibrous tumor   RESECTION OF MEDIASTINAL MASS Right 01/23/2016   this is in error- no mediastinal tumor   RESECTION OF MEDIASTINAL MASS Right 11/13/2021   Procedure: RESECTION OF PLEURAL TUMORS;  Surgeon: Loreli Slot, MD;  Location: [facility] OR;  Service: Thoracic;  Laterality: Right;   THORACOTOMY Right 04/27/2020   Procedure: THORACOTOMY;  Surgeon: Loreli Slot, MD;  Location: [facility] OR;  Service: Thoracic;  Laterality: Right;   THORACOTOMY Right 11/13/2021   Procedure: REDO THORACOTOMY;  Surgeon: Loreli Slot, MD;  Location: [facility] OR;  Service: Thoracic;  Laterality: Right;   TONSILLECTOMY     VIDEO BRONCHOSCOPY WITH ENDOBRONCHIAL ULTRASOUND N/A 12/17/2015   Procedure: VIDEO BRONCHOSCOPY WITH ENDOBRONCHIAL ULTRASOUND;  Surgeon: Loreli Slot, MD;  Location: [facility] OR;  Service: Thoracic;  Laterality: N/A;    REVIEW OF SYSTEMS:   Constitutional:  Negative for appetite change, chills, fever and unexpected weight change.  HENT: Negative for mouth sores, nosebleeds, sore throat and trouble swallowing.  Eyes: Negative for eye problems and icterus.  Respiratory: Positive for improving dyspnea on exertion. Negative for hemoptysis and wheezing.   Cardiovascular: Positive for improving but persistent lower extremity swelling bilaterally and right-sided chest  discomfort. Gastrointestinal: Negative for abdominal pain, constipation, diarrhea (resolved at this time), nausea and vomiting.  Genitourinary: Negative for bladder incontinence, difficulty urinating, dysuria, frequency and hematuria.   Musculoskeletal: Negative for back pain, gait problem, neck pain and neck stiffness.  Skin: Negative for itching and rash.  Neurological: Negative for dizziness, extremity weakness, gait problem, headaches, light-headedness and seizures.  Hematological: Negative for adenopathy. Does not bruise/bleed easily.  Psychiatric/Behavioral: Negative for confusion, depression and sleep disturbance. The patient is not nervous/anxious.    PHYSICAL EXAMINATION:  Blood pressure (!) 147/88, pulse 63, temperature 97.7 F (36.5 C), temperature source Oral, resp. rate 16, weight 173 lb (78.5 kg), SpO2 100 %.  ECOG PERFORMANCE STATUS: 1  Physical Exam  Constitutional: Oriented to person, place, and time and well-developed, well-nourished, and in no distress.  HENT:  Head: Normocephalic and atraumatic.  Mouth/Throat: Oropharynx is clear and moist. No oropharyngeal exudate.  Eyes: Conjunctivae are normal. Right eye exhibits no discharge. Left eye exhibits no discharge. No scleral icterus.  Neck: Normal range of motion. Neck supple.  Cardiovascular:  Normal rate, regular rhythm, normal heart sounds and intact distal pulses.   Pulmonary/Chest: Effort normal.  Quiet breath sounds in the right lung.  No respiratory distress. No wheezes. No rales.  Abdominal: Soft. Bowel sounds are normal. Exhibits no distension and no mass. There is no tenderness.  Musculoskeletal: Normal range of motion. Bilateral mild lower extremity (L>R due to prior L knee surgery) (improved compared to prior). Lymphadenopathy:    No cervical adenopathy.  Neurological: Alert and oriented to person, place, and time. Exhibits normal muscle tone. Gait normal. Coordination normal.  Skin: Skin is warm and dry. No  rash noted. Not diaphoretic. No erythema. No pallor.  Psychiatric: Mood, memory and judgment normal.  Vitals reviewed.  LABORATORY DATA: Lab Results  Component Value Date   WBC 6.4 08/28/2022   HGB 14.4 08/28/2022   HCT 44.2 08/28/2022   MCV 98.4 08/28/2022   PLT 154 08/28/2022      Chemistry      Component Value Date/Time   NA 141 08/14/2022 1014   K 4.1 08/14/2022 1014   CL 106 08/14/2022 1014   CO2 31 08/14/2022 1014   BUN 16 08/14/2022 1014   CREATININE 0.87 08/14/2022 1014      Component Value Date/Time   CALCIUM 8.7 (L) 08/14/2022 1014   ALKPHOS 94 08/14/2022 1014   AST 20 08/14/2022 1014   ALT 23 08/14/2022 1014   BILITOT 0.9 08/14/2022 1014       RADIOGRAPHIC STUDIES:  No results found.   ASSESSMENT/PLAN:  This is a very pleasant 62 year old male with  1) recurrent solitary fibrous tumor of the right lung.  This was initially diagnosed in June 2017.  2) prostate adenocarcinoma with Gleason score of 7 (4+3) diagnosed in February 2024 followed by urology.   He is status post resection.  He had recurrence in October 2021 and underwent re-resection under the care of Dr. Dorris Fetch.  He had been on observation as there is no role for adjuvant therapy for his condition.  He was seen by Dr. Lin Givens at [facility] who recommended observation and consideration of treatment with Sunitinib followed by surgery if the patient had disease recurrence.   The patient had a new 1.4 cm nodule posteriorly and the patient underwent treatment with sunitinib 37.5 mg p.o. daily.  He was on this for 3 months. He had been tolerating this fairly well except for hypertension.  Unfortunately scan showed evidence of disease progression with interval development of multiple new pleural-based nodules in the right hemithorax measuring up to 2.9 cm which is concerning for recurrent/metastatic disease.    He was then seen by Dr. Dorris Fetch who offered surgical resection.  Dr. Lin Givens  recommended neoadjuvant treatment with pazopanib for 8 weeks followed by surgical resection if he had improvement in his disease followed by continued adjuvant treatment for another 6 to 8 months.  Unfortunately, after 2 months of treatment the CT scan showed clear evidence of disease progression with enlarging right pleural-based masses.  He was referred to Dr. 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  Molecular studies by foundation 1 and PD-L1 did not show any actionable mutations and his PD-L1 expression was negative.   The patient then saw Dr. Waymon Amato at [facility] who recommended inpatient systemic chemotherapy with doxorubicin, ifosfamide,  and mesna IV every 3 weeks.  He had a 2D echo as well as a Port-A-Cath placed.  This was discontinued due to evidence of disease progression.    Dr. Waymon Amato recommended for the patient treatment with Temodar 200 Mg/M2 on days 1-7 and 15-21 in addition to Avastin 5 Mg/M2 on days 8 and 22 every 4 weeks.  He is here today for day 8 cycle 5.   Recently had a restaging CT scan of the chest at [facility] which showed stable disease/slight improvement.   The patient was seen with Dr. Arbutus Ped today.  Labs were reviewed.  Recommend that he proceed with cycle number 5-day 8 today scheduled.  Dr. Arbutus Ped let the patient know that he did talk to Dr. Salomon Fick at [facility].   He is transferring his care for his urologist to Dr. Laverle Patter in [place] for his prostate cancer.  The patient met with Dr. Kathrynn Running from radiation oncology earlier this week.  Patient is still contemplating his options.  However, they are trying to avoid any surgery or delayed wound healing as the patient is currently on Avastin and this would need to be  held for surgical intervention.  Dr. Salomon Fick is hesitant to have this held as Avastin is working for him.  If the patient does need his Avastin held, he will call us and let us know if and when the procedure is scheduled for.   Will follow-up with Dr. Vevelyn Royals office to see if they can schedule a follow-up/New patient appointment.  The patient was advised to call immediately if he has any concerning symptoms in the interval. The patient voices understanding of current disease status and treatment options and is in agreement with the current care plan. All questions were answered. The patient knows to call the clinic with any problems, questions or concerns. We can certainly see the patient much sooner if necessary  No orders of the defined types were placed in this encounter.    Stevie Ertle L Reine Bristow, PA-C 08/28/22  ADDENDUM: Hematology/Oncology Attending: I had a face-to-face encounter with the patient today.  I reviewed his record, lab and recommended his care plan.  This is a very pleasant 62 years old African-American male with recurrent solitary fibrous tumor of the right lung initially diagnosed in June 2017.  The patient also has a recent diagnosis of prostate adenocarcinoma with Gleason score of 7 (4+3 diagnosed in February 2024.  He is status post several treatment in the past including several surgical resection as well as treatment with sunitinib, Votrient as well as doxorubicin, ifosfamide and mesna discontinued secondary to disease progression. He is currently on treatment with Temodar 200 Mg/M2 on days 1-7 and 15-21 with Avastin 5 Mg/KG on days 8 and 22 every 4 weeks.  He is status post 4 cycles.  He has been tolerating this treatment fairly well.  Imaging studies performed at [facility] showed some improvement of his disease. I recommended for the patient to continue his current treatment with the same regimen.  I also discussed his case with Dr. Salomon Fick at [facility] and the patient could be eligible for clinical trial in the future if the prostate adenocarcinoma is under control.  He was seen by Dr. Kathrynn Running and expected to see Dr. Laverle Patter for discussion of his treatment options and likely will be with seed implants.  Surgery is not preferred because of the requirement for holding the Avastin for several weeks. The patient and his wife are in agreement with the  current plan. He will come back for follow-up visit in 4 weeks. The patient was advised to call immediately if he has any other concerning symptoms in the interval. The total time spent in the appointment was 30 minutes. Lajuana Matte, MD

## 2022-08-28 ENCOUNTER — Inpatient Hospital Stay (HOSPITAL_BASED_OUTPATIENT_CLINIC_OR_DEPARTMENT_OTHER): Payer: BC Managed Care – PPO | Admitting: Physician Assistant

## 2022-08-28 ENCOUNTER — Inpatient Hospital Stay: Payer: BC Managed Care – PPO

## 2022-08-28 ENCOUNTER — Inpatient Hospital Stay: Payer: BC Managed Care – PPO | Attending: Internal Medicine

## 2022-08-28 VITALS — BP 131/77 | HR 61 | Resp 18

## 2022-08-28 VITALS — BP 147/88 | HR 63 | Temp 97.7°F | Resp 16 | Wt 173.0 lb

## 2022-08-28 DIAGNOSIS — K219 Gastro-esophageal reflux disease without esophagitis: Secondary | ICD-10-CM | POA: Insufficient documentation

## 2022-08-28 DIAGNOSIS — Z79899 Other long term (current) drug therapy: Secondary | ICD-10-CM | POA: Insufficient documentation

## 2022-08-28 DIAGNOSIS — R5383 Other fatigue: Secondary | ICD-10-CM | POA: Diagnosis not present

## 2022-08-28 DIAGNOSIS — R197 Diarrhea, unspecified: Secondary | ICD-10-CM | POA: Diagnosis not present

## 2022-08-28 DIAGNOSIS — Z5111 Encounter for antineoplastic chemotherapy: Secondary | ICD-10-CM

## 2022-08-28 DIAGNOSIS — Z87442 Personal history of urinary calculi: Secondary | ICD-10-CM | POA: Insufficient documentation

## 2022-08-28 DIAGNOSIS — D492 Neoplasm of unspecified behavior of bone, soft tissue, and skin: Secondary | ICD-10-CM

## 2022-08-28 DIAGNOSIS — C61 Malignant neoplasm of prostate: Secondary | ICD-10-CM | POA: Insufficient documentation

## 2022-08-28 DIAGNOSIS — Z95828 Presence of other vascular implants and grafts: Secondary | ICD-10-CM

## 2022-08-28 DIAGNOSIS — C3491 Malignant neoplasm of unspecified part of right bronchus or lung: Secondary | ICD-10-CM | POA: Diagnosis not present

## 2022-08-28 DIAGNOSIS — Z7952 Long term (current) use of systemic steroids: Secondary | ICD-10-CM | POA: Diagnosis not present

## 2022-08-28 DIAGNOSIS — Z5112 Encounter for antineoplastic immunotherapy: Secondary | ICD-10-CM | POA: Diagnosis not present

## 2022-08-28 DIAGNOSIS — D61818 Other pancytopenia: Secondary | ICD-10-CM | POA: Insufficient documentation

## 2022-08-28 DIAGNOSIS — Z79630 Long term (current) use of alkylating agent: Secondary | ICD-10-CM | POA: Diagnosis not present

## 2022-08-28 DIAGNOSIS — E299 Testicular dysfunction, unspecified: Secondary | ICD-10-CM | POA: Diagnosis not present

## 2022-08-28 DIAGNOSIS — R918 Other nonspecific abnormal finding of lung field: Secondary | ICD-10-CM

## 2022-08-28 DIAGNOSIS — J45909 Unspecified asthma, uncomplicated: Secondary | ICD-10-CM | POA: Diagnosis not present

## 2022-08-28 LAB — TOTAL PROTEIN, URINE DIPSTICK: Protein, ur: 30 mg/dL — AB

## 2022-08-28 LAB — CBC WITH DIFFERENTIAL (CANCER CENTER ONLY)
Abs Immature Granulocytes: 0.08 10*3/uL — ABNORMAL HIGH (ref 0.00–0.07)
Basophils Absolute: 0 10*3/uL (ref 0.0–0.1)
Basophils Relative: 0 %
Eosinophils Absolute: 0 10*3/uL (ref 0.0–0.5)
Eosinophils Relative: 1 %
HCT: 44.2 % (ref 39.0–52.0)
Hemoglobin: 14.4 g/dL (ref 13.0–17.0)
Immature Granulocytes: 1 %
Lymphocytes Relative: 8 %
Lymphs Abs: 0.5 10*3/uL — ABNORMAL LOW (ref 0.7–4.0)
MCH: 32.1 pg (ref 26.0–34.0)
MCHC: 32.6 g/dL (ref 30.0–36.0)
MCV: 98.4 fL (ref 80.0–100.0)
Monocytes Absolute: 0.5 10*3/uL (ref 0.1–1.0)
Monocytes Relative: 8 %
Neutro Abs: 5.3 10*3/uL (ref 1.7–7.7)
Neutrophils Relative %: 82 %
Platelet Count: 154 10*3/uL (ref 150–400)
RBC: 4.49 MIL/uL (ref 4.22–5.81)
RDW: 13.1 % (ref 11.5–15.5)
WBC Count: 6.4 10*3/uL (ref 4.0–10.5)
nRBC: 0 % (ref 0.0–0.2)

## 2022-08-28 MED ORDER — SODIUM CHLORIDE 0.9% FLUSH
10.0000 mL | Freq: Once | INTRAVENOUS | Status: AC
  Administered 2022-08-28: 10 mL

## 2022-08-28 MED ORDER — HEPARIN SOD (PORK) LOCK FLUSH 100 UNIT/ML IV SOLN
500.0000 [IU] | Freq: Once | INTRAVENOUS | Status: AC | PRN
  Administered 2022-08-28: 500 [IU]

## 2022-08-28 MED ORDER — SODIUM CHLORIDE 0.9% FLUSH
10.0000 mL | INTRAVENOUS | Status: DC | PRN
  Administered 2022-08-28: 10 mL

## 2022-08-28 MED ORDER — SODIUM CHLORIDE 0.9 % IV SOLN: Freq: Once | INTRAVENOUS | Status: AC

## 2022-08-28 MED ORDER — SODIUM CHLORIDE 0.9 % IV SOLN
5.0000 mg/kg | Freq: Once | INTRAVENOUS | Status: AC
  Administered 2022-08-28: 400 mg via INTRAVENOUS
  Filled 2022-08-28: qty 16

## 2022-08-28 NOTE — Progress Notes (Signed)
Per Cassie PA-C OK to proceed with tx today using 08/21/2022 CMP from [facility].

## 2022-08-28 NOTE — Patient Instructions (Signed)
[facility]  Discharge Instructions: Thank you for choosing [facility] to provide your oncology and hematology care.   If you have a lab appointment with the Cancer Center, please go directly to the Cancer Center and check in at the registration area.   Wear comfortable clothing and clothing appropriate for easy access to any Portacath or PICC line.   We strive to give you quality time with your provider. You may need to reschedule your appointment if you arrive late (15 or more minutes).  Arriving late affects you and other patients whose appointments are after yours.  Also, if you miss three or more appointments without notifying the office, you may be dismissed from the clinic at the provider's discretion.      For prescription refill requests, have your pharmacy contact our office and allow 72 hours for refills to be completed.    Today you received the following chemotherapy and/or immunotherapy agents: Bevacizumab      To help prevent nausea and vomiting after your treatment, we encourage you to take your nausea medication as directed.  BELOW ARE SYMPTOMS THAT SHOULD BE REPORTED IMMEDIATELY: *FEVER GREATER THAN 100.4 F (38 C) OR HIGHER *CHILLS OR SWEATING *NAUSEA AND VOMITING THAT IS NOT CONTROLLED WITH YOUR NAUSEA MEDICATION *UNUSUAL SHORTNESS OF BREATH *UNUSUAL BRUISING OR BLEEDING *URINARY PROBLEMS (pain or burning when urinating, or frequent urination) *BOWEL PROBLEMS (unusual diarrhea, constipation, pain near the anus) TENDERNESS IN MOUTH AND THROAT WITH OR WITHOUT PRESENCE OF ULCERS (sore throat, sores in mouth, or a toothache) UNUSUAL RASH, SWELLING OR PAIN  UNUSUAL VAGINAL DISCHARGE OR ITCHING   Items with * indicate a potential emergency and should be followed up as soon as possible or go to the Emergency Department if any problems should occur.  Please show the CHEMOTHERAPY ALERT CARD or IMMUNOTHERAPY ALERT CARD at  check-in to the Emergency Department and triage nurse.  Should you have questions after your visit or need to cancel or reschedule your appointment, please contact [facility]  Dept: [phone]  and follow the prompts.  Office hours are 8:00 a.m. to 4:30 p.m. Monday - Friday. Please note that voicemails left after 4:00 p.m. may not be returned until the following business day.  We are closed weekends and major holidays. You have access to a nurse at all times for urgent questions. Please call the main number to the clinic Dept: [phone] and follow the prompts.   For any non-urgent questions, you may also contact your provider using MyChart. We now offer e-Visits for anyone 18 and older to request care online for non-urgent symptoms. For details visit [email or web address].   Also download the MyChart app! Go to the app store, search "MyChart", open the app, select [facility], and log in with your MyChart username and password.   

## 2022-09-01 NOTE — Progress Notes (Signed)
RN left voicemail for call back to follow up after recent consult.

## 2022-09-05 NOTE — Progress Notes (Signed)
Patient recent [place] on 08/25/22 for his stage T1c adenocarcinoma of the prostate with Gleason score of 3+4, and PSA of 10.2.   Patient has decided to proceed with daily radiation with fiducial marker's and spaceOAR gel.    RN provided education on next steps, verbalized understanding.  MD's notified, plan of care in progress.

## 2022-09-09 ENCOUNTER — Other Ambulatory Visit: Payer: Self-pay | Admitting: Urology

## 2022-09-09 ENCOUNTER — Other Ambulatory Visit (HOSPITAL_COMMUNITY): Payer: Self-pay | Admitting: Urology

## 2022-09-09 DIAGNOSIS — C61 Malignant neoplasm of prostate: Secondary | ICD-10-CM

## 2022-09-09 MED ORDER — FLEET ENEMA 7-19 GM/118ML RE ENEM: 1.0000 | ENEMA | Freq: Once | RECTAL | Status: AC

## 2022-09-11 ENCOUNTER — Inpatient Hospital Stay: Payer: BC Managed Care – PPO

## 2022-09-11 ENCOUNTER — Inpatient Hospital Stay: Payer: BC Managed Care – PPO | Admitting: Internal Medicine

## 2022-09-11 VITALS — BP 133/81 | HR 62 | Temp 98.2°F | Resp 16 | Wt 174.2 lb

## 2022-09-11 VITALS — BP 136/71 | HR 62 | Temp 98.5°F | Resp 16

## 2022-09-11 DIAGNOSIS — R918 Other nonspecific abnormal finding of lung field: Secondary | ICD-10-CM

## 2022-09-11 DIAGNOSIS — D492 Neoplasm of unspecified behavior of bone, soft tissue, and skin: Secondary | ICD-10-CM

## 2022-09-11 DIAGNOSIS — Z95828 Presence of other vascular implants and grafts: Secondary | ICD-10-CM

## 2022-09-11 DIAGNOSIS — C3491 Malignant neoplasm of unspecified part of right bronchus or lung: Secondary | ICD-10-CM | POA: Diagnosis not present

## 2022-09-11 LAB — CBC WITH DIFFERENTIAL (CANCER CENTER ONLY)
Abs Immature Granulocytes: 0.06 10*3/uL (ref 0.00–0.07)
Basophils Absolute: 0 10*3/uL (ref 0.0–0.1)
Basophils Relative: 0 %
Eosinophils Absolute: 0.1 10*3/uL (ref 0.0–0.5)
Eosinophils Relative: 1 %
HCT: 43.6 % (ref 39.0–52.0)
Hemoglobin: 14.3 g/dL (ref 13.0–17.0)
Immature Granulocytes: 1 %
Lymphocytes Relative: 13 %
Lymphs Abs: 0.7 10*3/uL (ref 0.7–4.0)
MCH: 32.2 pg (ref 26.0–34.0)
MCHC: 32.8 g/dL (ref 30.0–36.0)
MCV: 98.2 fL (ref 80.0–100.0)
Monocytes Absolute: 0.7 10*3/uL (ref 0.1–1.0)
Monocytes Relative: 14 %
Neutro Abs: 3.6 10*3/uL (ref 1.7–7.7)
Neutrophils Relative %: 71 %
Platelet Count: 164 10*3/uL (ref 150–400)
RBC: 4.44 MIL/uL (ref 4.22–5.81)
RDW: 13.2 % (ref 11.5–15.5)
WBC Count: 5.2 10*3/uL (ref 4.0–10.5)
nRBC: 0 % (ref 0.0–0.2)

## 2022-09-11 LAB — CMP (CANCER CENTER ONLY)
ALT: 17 U/L (ref 0–44)
AST: 19 U/L (ref 15–41)
Albumin: 3.7 g/dL (ref 3.5–5.0)
Alkaline Phosphatase: 79 U/L (ref 38–126)
Anion gap: 3 — ABNORMAL LOW (ref 5–15)
BUN: 16 mg/dL (ref 8–23)
CO2: 32 mmol/L (ref 22–32)
Calcium: 9.1 mg/dL (ref 8.9–10.3)
Chloride: 104 mmol/L (ref 98–111)
Creatinine: 1.15 mg/dL (ref 0.61–1.24)
GFR, Estimated: >60 mL/min (ref >60)
Glucose, Bld: 94 mg/dL (ref 70–99)
Potassium: 3.9 mmol/L (ref 3.5–5.1)
Sodium: 139 mmol/L (ref 135–145)
Total Bilirubin: 1 mg/dL (ref 0.3–1.2)
Total Protein: 6.7 g/dL (ref 6.5–8.1)

## 2022-09-11 LAB — TOTAL PROTEIN, URINE DIPSTICK: Protein, ur: NEGATIVE mg/dL

## 2022-09-11 MED ORDER — SODIUM CHLORIDE 0.9% FLUSH
10.0000 mL | Freq: Once | INTRAVENOUS | Status: AC
  Administered 2022-09-11: 10 mL

## 2022-09-11 MED ORDER — HEPARIN SOD (PORK) LOCK FLUSH 100 UNIT/ML IV SOLN
500.0000 [IU] | Freq: Once | INTRAVENOUS | Status: AC | PRN
  Administered 2022-09-11: 500 [IU]

## 2022-09-11 MED ORDER — SODIUM CHLORIDE 0.9% FLUSH
10.0000 mL | INTRAVENOUS | Status: DC | PRN
  Administered 2022-09-11: 10 mL

## 2022-09-11 MED ORDER — SODIUM CHLORIDE 0.9 % IV SOLN: Freq: Once | INTRAVENOUS | Status: AC

## 2022-09-11 MED ORDER — SODIUM CHLORIDE 0.9 % IV SOLN
5.0000 mg/kg | Freq: Once | INTRAVENOUS | Status: AC
  Administered 2022-09-11: 400 mg via INTRAVENOUS
  Filled 2022-09-11: qty 16

## 2022-09-11 NOTE — Progress Notes (Signed)
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  PRIOR THERAPY:  1) status post resection and the patient has recurrence and October 2021 status post resection again under the care of Dr. Roxan Hockey. 2) Sunitinib 3 7.5 mg p.o. daily.  First dose started April 08, 2021.  Status post 3 months of treatment.  This treatment was discontinued secondary to disease progression. 3) Votrient (pazopanib) 800 mg p.o. daily.  Started August 10, 2021.  Status post 2 months of treatment discontinued secondary to disease progression. 4) status post redo right thoracotomy with resection of pleural tumors and lymph node dissection under the care of Dr. Roxan Hockey on Nov 13, 2021. 5) Systemic chemotherapy with doxorubicin 25 Mg/M2, ifosfamide 2500 Mg/M2 and mesna 2500 Mg/M2 days 1-3 every 3 weeks.  First dose February 12, 2022 at [facility].  Status post 2 cycles of treatment discontinued secondary to disease progression.   CURRENT THERAPY: He is expected to start treatment with systemic chemotherapy with Temodar 200 Mg/M2 on days 1-7 and day 15-21 with a Avastin 5 Mg/KG on days 8 and 22 every 4 weeks.  Status post 4 cycles  INTERVAL HISTORY: Mathew [name] 62 y.o. male returns to the clinic today for follow-up visit.  The patient is feeling fine today with no concerning complaints.  The patient is feeling fine today with no concerning complaints except for the baseline shortness of breath increased with exertion.  He has no nausea, vomiting, diarrhea or constipation.  He has no headache or visual changes.  He has chest pain, cough or hemoptysis.  He has no  recent weight loss or night sweats.  He is here today for evaluation before starting day #22 of cycle #5.    MEDICAL HISTORY: Past Medical History:  Diagnosis Date   Arthritis    osteo   Asthma    asthma as a child   Clubbing of nails    Deafness in left ear    GERD (gastroesophageal reflux disease)    History of kidney stones    Metastatic sarcoma (HCC)    Pneumonia    Shortness of breath dyspnea    just initially   Vitamin D deficiency     ALLERGIES:  is allergic to aspirin and penicillins.  MEDICATIONS:  Current Outpatient Medications  Medication Sig Dispense Refill   amLODipine (NORVASC) 10 MG tablet Take 20 mg by mouth daily.     predniSONE (DELTASONE) 5 MG tablet Take 5 mg by mouth daily.     temozolomide (TEMODAR) 100 MG capsule Take 3 capsules (300mg ) nightly by mouth days 1-7 & 15-21. Off days 8-14 & 22-28.     No current facility-administered medications for this visit.   Facility-Administered Medications Ordered in Other Visits  Medication Dose Route Frequency Provider Last Rate Last Admin   [START ON 10/20/2022] sodium phosphate (FLEET) 7-19 GM/118ML enema 1 enema  1 enema Rectal Once Robley Fries, MD        SURGICAL HISTORY:  Past Surgical History:  Procedure Laterality Date   INTERCOSTAL NERVE BLOCK Right 04/27/2020   Procedure: INTERCOSTAL NERVE BLOCK;  Surgeon: [name], MD;  Location: [facility]  OR;  Service: Thoracic;  Laterality: Right;   KNEE CARTILAGE SURGERY Left 1976   pleural tumor Right 01/22/2016   resection of solitary fibrous tumor   RESECTION OF MEDIASTINAL MASS Right 01/23/2016   this is in error- no mediastinal tumor   RESECTION OF MEDIASTINAL MASS Right 11/13/2021   Procedure: RESECTION OF PLEURAL TUMORS;  Surgeon: [name], MD;  Location: [facility];  Service: Thoracic;  Laterality: Right;   THORACOTOMY Right 04/27/2020   Procedure: THORACOTOMY;  Surgeon: [name], MD;  Location: [facility];  Service: Thoracic;   Laterality: Right;   THORACOTOMY Right 11/13/2021   Procedure: REDO THORACOTOMY;  Surgeon: [name], MD;  Location: [facility];  Service: Thoracic;  Laterality: Right;   TONSILLECTOMY     VIDEO BRONCHOSCOPY WITH ENDOBRONCHIAL ULTRASOUND N/A 12/17/2015   Procedure: VIDEO BRONCHOSCOPY WITH ENDOBRONCHIAL ULTRASOUND;  Surgeon: [name], MD;  Location: [facility];  Service: Thoracic;  Laterality: N/A;    REVIEW OF SYSTEMS:  A comprehensive review of systems was negative except for: Respiratory: positive for dyspnea on exertion   PHYSICAL EXAMINATION: General appearance: alert, cooperative, fatigued, and no distress Head: Normocephalic, without obvious abnormality, atraumatic Neck: no adenopathy, no JVD, supple, symmetrical, trachea midline, and thyroid not enlarged, symmetric, no tenderness/mass/nodules Lymph nodes: Cervical, supraclavicular, and axillary nodes normal. Resp: clear to auscultation bilaterally Back: symmetric, no curvature. ROM normal. No CVA tenderness. Cardio: regular rate and rhythm, S1, S2 normal, no murmur, click, rub or gallop GI: soft, non-tender; bowel sounds normal; no masses,  no organomegaly Extremities: extremities normal, atraumatic, no cyanosis or edema  ECOG PERFORMANCE STATUS: 1 - Symptomatic but completely ambulatory  Blood pressure 133/81, pulse 62, temperature 98.2 F (36.8 C), temperature source Oral, resp. rate 16, weight 174 lb 3.2 oz (79 kg), SpO2 98 %.  LABORATORY DATA: Lab Results  Component Value Date   WBC 6.4 08/28/2022   HGB 14.4 08/28/2022   HCT 44.2 08/28/2022   MCV 98.4 08/28/2022   PLT 154 08/28/2022      Chemistry      Component Value Date/Time   NA 141 08/14/2022 1014   K 4.1 08/14/2022 1014   CL 106 08/14/2022 1014   CO2 31 08/14/2022 1014   BUN 16 08/14/2022 1014   CREATININE 0.87 08/14/2022 1014      Component Value Date/Time   CALCIUM 8.7 (L) 08/14/2022 1014   ALKPHOS 94 08/14/2022 1014   AST 20 08/14/2022  1014   ALT 23 08/14/2022 1014   BILITOT 0.9 08/14/2022 1014       RADIOGRAPHIC STUDIES: No results found.  ASSESSMENT AND PLAN: This is a very pleasant 62 years old male with recurrent solitary fibrous tumor of the right lung diagnosed in June 2017 s/p resection followed by recurrence in October 2021 status post reresection under the care of Dr. Roxan Hockey. The patient has been on observation since his resection.  There was no role for adjuvant therapy for his condition.  He was seen by Dr. Kendall Flack at [facility] who recommended for him observation and consideration of treatment with sunitinib followed by surgery if the patient has disease recurrence He had repeat CT scan of the chest performed recently.  The scan showed stable disease except for a new 1.4 cm nodular component posteriorly. The patient is currently undergoing treatment with sunitinib 37.5 mg p.o. daily status post 3 months of treatment.  The patient has been tolerating his treatment with Sutent fairly well except for the  hypertension. Unfortunately has a scan showed evidence for disease progression with interval development of multiple new pleural-based nodules in the right hemithorax measuring up to 2.9 cm concerning for recurrent/metastatic disease. The patient was seen recently by Dr. Roxan Hockey who offered surgical resection by Dr. Kendall Flack recommended neoadjuvant treatment with pazopanib for 8 weeks followed by surgical resection if the patient has a stable or improvement of his disease followed by adjuvant treatment with Pazopanib for another 6-8 months. He was treated with pazopanib 800 mg p.o. daily.  He is status post 2 months of treatment. Repeat CT scan showed clear evidence for disease progression with enlarging right pleural-based masses. The patient was referred to Dr. 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  I sent the tissue block to [facility] for molecular studies and PD-L1 expression.  Unfortunately the molecular studies showed no actionable mutations and his PD-L1 expression was negative. The patient was seen recently by Dr. Angelina Ok at [facility] and he received treatment with inpatient systemic chemotherapy with doxorubicin, ifosfamide and mesna every 3 weeks.  Status post 2 cycles.  He had fatigue and pancytopenia with this treatment. His treatment was discontinued secondary to disease progression. Dr. Angelina Ok recommended for the patient treatment with Temodar 200 Mg/M2 on days 1-7 and 15-21 in addition to Avastin 5 Mg/M2 on days 8 and 22 every 4 weeks.  Status post 4 cycles. He has been tolerating this treatment well with no concerning adverse effects. I recommended for him to proceed with day #22 of cycle #5 today as planned. He will come back for follow-up visit in 2 weeks for evaluation before starting cycle #6. For the prostate cancer, he is followed by Dr. Tammi Klippel and expected to start curative radiotherapy in May 2024. The patient was advised to call immediately if he has any other concerning symptoms in the interval.  The patient voices understanding of current disease status and treatment options and is in agreement with the current care plan.  All questions were answered. The patient knows to call the clinic with any problems, questions or concerns. We can certainly see the patient much sooner if necessary.  The total time spent in the appointment was 20 minutes.  Disclaimer: This note was dictated with voice recognition software. Similar sounding words can inadvertently be transcribed and may not be corrected upon review.

## 2022-09-11 NOTE — Patient Instructions (Signed)
[facility]  Discharge Instructions: Thank you for choosing [facility] to provide your oncology and hematology care.   If you have a lab appointment with the Cancer Center, please go directly to the Cancer Center and check in at the registration area.   Wear comfortable clothing and clothing appropriate for easy access to any Portacath or PICC line.   We strive to give you quality time with your provider. You may need to reschedule your appointment if you arrive late (15 or more minutes).  Arriving late affects you and other patients whose appointments are after yours.  Also, if you miss three or more appointments without notifying the office, you may be dismissed from the clinic at the provider's discretion.      For prescription refill requests, have your pharmacy contact our office and allow 72 hours for refills to be completed.    Today you received the following chemotherapy and/or immunotherapy agents: Bevacizumab      To help prevent nausea and vomiting after your treatment, we encourage you to take your nausea medication as directed.  BELOW ARE SYMPTOMS THAT SHOULD BE REPORTED IMMEDIATELY: *FEVER GREATER THAN 100.4 F (38 C) OR HIGHER *CHILLS OR SWEATING *NAUSEA AND VOMITING THAT IS NOT CONTROLLED WITH YOUR NAUSEA MEDICATION *UNUSUAL SHORTNESS OF BREATH *UNUSUAL BRUISING OR BLEEDING *URINARY PROBLEMS (pain or burning when urinating, or frequent urination) *BOWEL PROBLEMS (unusual diarrhea, constipation, pain near the anus) TENDERNESS IN MOUTH AND THROAT WITH OR WITHOUT PRESENCE OF ULCERS (sore throat, sores in mouth, or a toothache) UNUSUAL RASH, SWELLING OR PAIN  UNUSUAL VAGINAL DISCHARGE OR ITCHING   Items with * indicate a potential emergency and should be followed up as soon as possible or go to the Emergency Department if any problems should occur.  Please show the CHEMOTHERAPY ALERT CARD or IMMUNOTHERAPY ALERT CARD at  check-in to the Emergency Department and triage nurse.  Should you have questions after your visit or need to cancel or reschedule your appointment, please contact [facility]  Dept: [phone]  and follow the prompts.  Office hours are 8:00 a.m. to 4:30 p.m. Monday - Friday. Please note that voicemails left after 4:00 p.m. may not be returned until the following business day.  We are closed weekends and major holidays. You have access to a nurse at all times for urgent questions. Please call the main number to the clinic Dept: [phone] and follow the prompts.   For any non-urgent questions, you may also contact your provider using MyChart. We now offer e-Visits for anyone 18 and older to request care online for non-urgent symptoms. For details visit [email or web address].   Also download the MyChart app! Go to the app store, search "MyChart", open the app, select [facility], and log in with your MyChart username and password.   

## 2022-09-11 NOTE — Addendum Note (Signed)
Addended by: Ardeen Garland on: 09/11/2022 10:36 AM   Modules accepted: Orders

## 2022-09-11 NOTE — Progress Notes (Signed)
Patient seen by Dr. Inez Pilgrim are within treatment parameters.  Labs reviewed: and are within treatment parameters.  Per physician team, patient is ready for treatment and there are NO modifications to the treatment plan.   Cycle 5 day 22 bevacizumab-awwb

## 2022-09-16 ENCOUNTER — Other Ambulatory Visit: Payer: Self-pay

## 2022-09-23 ENCOUNTER — Encounter: Payer: Self-pay | Admitting: Internal Medicine

## 2022-09-25 ENCOUNTER — Inpatient Hospital Stay: Payer: BC Managed Care – PPO

## 2022-09-25 ENCOUNTER — Inpatient Hospital Stay: Payer: BC Managed Care – PPO | Admitting: Physician Assistant

## 2022-09-26 ENCOUNTER — Other Ambulatory Visit: Payer: Self-pay

## 2022-10-06 NOTE — Progress Notes (Unsigned)
[facility] OFFICE PROGRESS NOTE  [name], MD [address] [place] [address]  DIAGNOSIS: ***  PRIOR THERAPY:  CURRENT THERAPY:  INTERVAL HISTORY: Mathew [name] 62 y.o. male returns for *** regular *** visit for followup of ***   MEDICAL HISTORY: Past Medical History:  Diagnosis Date   Arthritis    osteo   Asthma    asthma as a child   Clubbing of nails    Deafness in left ear    GERD (gastroesophageal reflux disease)    History of kidney stones    Metastatic sarcoma (HCC)    Pneumonia    Shortness of breath dyspnea    just initially   Vitamin D deficiency     ALLERGIES:  is allergic to aspirin and penicillins.  MEDICATIONS:  Current Outpatient Medications  Medication Sig Dispense Refill   amLODipine (NORVASC) 10 MG tablet Take 20 mg by mouth daily.     bevacizumab-awwb (MVASI) 400 MG/16ML SOLN Inject 5 mg/kg into the vein once.     Continuous Blood Gluc Sensor (FREESTYLE LIBRE 2 SENSOR) MISC USE 1 KIT EVERY 14 (FOURTEEN) DAYS FOR 28 DAYS FOR GLUCOSE MONITORING     No current facility-administered medications for this visit.   Facility-Administered Medications Ordered in Other Visits  Medication Dose Route Frequency Provider Last Rate Last Admin   [START ON 10/20/2022] sodium phosphate (FLEET) 7-19 GM/118ML enema 1 enema  1 enema Rectal Once Noel Christmas, MD        SURGICAL HISTORY:  Past Surgical History:  Procedure Laterality Date   INTERCOSTAL NERVE BLOCK Right 04/27/2020   Procedure: INTERCOSTAL NERVE BLOCK;  Surgeon: Loreli Slot, MD;  Location: [facility] OR;  Service: Thoracic;  Laterality: Right;   KNEE CARTILAGE SURGERY Left 1976   pleural tumor Right 01/22/2016   resection of solitary fibrous tumor   RESECTION OF MEDIASTINAL MASS Right 01/23/2016   this is in error- no mediastinal tumor   RESECTION OF MEDIASTINAL MASS Right 11/13/2021   Procedure: RESECTION OF PLEURAL TUMORS;  Surgeon: Loreli Slot, MD;  Location: [facility] OR;  Service: Thoracic;  Laterality: Right;   THORACOTOMY Right 04/27/2020   Procedure: THORACOTOMY;  Surgeon: Loreli Slot, MD;  Location: [facility] OR;  Service: Thoracic;  Laterality: Right;   THORACOTOMY Right 11/13/2021   Procedure: REDO THORACOTOMY;  Surgeon: Loreli Slot, MD;  Location: [facility] OR;  Service: Thoracic;  Laterality: Right;   TONSILLECTOMY     VIDEO BRONCHOSCOPY WITH ENDOBRONCHIAL ULTRASOUND N/A 12/17/2015   Procedure: VIDEO BRONCHOSCOPY WITH ENDOBRONCHIAL ULTRASOUND;  Surgeon: Loreli Slot, MD;  Location: [facility] OR;  Service: Thoracic;  Laterality: N/A;    REVIEW OF SYSTEMS:   Review of Systems  Constitutional: Negative for appetite change, chills, fatigue, fever and unexpected weight change.  HENT:   Negative for mouth sores, nosebleeds, sore throat and trouble swallowing.   Eyes: Negative for eye problems and icterus.  Respiratory: Negative for cough, hemoptysis, shortness of breath and wheezing.   Cardiovascular: Negative for chest pain and leg swelling.  Gastrointestinal: Negative for abdominal pain, constipation, diarrhea, nausea and vomiting.  Genitourinary: Negative for bladder incontinence, difficulty urinating, dysuria, frequency and hematuria.   Musculoskeletal: Negative for back pain, gait problem, neck pain and neck stiffness.  Skin: Negative for itching and rash.  Neurological: Negative for dizziness, extremity weakness, gait problem, headaches, light-headedness and seizures.  Hematological: Negative for adenopathy. Does not bruise/bleed easily.  Psychiatric/Behavioral: Negative for confusion, depression  and sleep disturbance. The patient is not nervous/anxious.     PHYSICAL EXAMINATION:  There were no vitals taken for this visit.  ECOG PERFORMANCE STATUS: {CHL ONC ECOG [id number]  Physical Exam  Constitutional: Oriented to person, place, and time and well-developed, well-nourished, and in no distress. No  distress.  HENT:  Head: Normocephalic and atraumatic.  Mouth/Throat: Oropharynx is clear and moist. No oropharyngeal exudate.  Eyes: Conjunctivae are normal. Right eye exhibits no discharge. Left eye exhibits no discharge. No scleral icterus.  Neck: Normal range of motion. Neck supple.  Cardiovascular: Normal rate, regular rhythm, normal heart sounds and intact distal pulses.   Pulmonary/Chest: Effort normal and breath sounds normal. No respiratory distress. No wheezes. No rales.  Abdominal: Soft. Bowel sounds are normal. Exhibits no distension and no mass. There is no tenderness.  Musculoskeletal: Normal range of motion. Exhibits no edema.  Lymphadenopathy:    No cervical adenopathy.  Neurological: Alert and oriented to person, place, and time. Exhibits normal muscle tone. Gait normal. Coordination normal.  Skin: Skin is warm and dry. No rash noted. Not diaphoretic. No erythema. No pallor.  Psychiatric: Mood, memory and judgment normal.  Vitals reviewed.  LABORATORY DATA: Lab Results  Component Value Date   WBC 5.2 09/11/2022   HGB 14.3 09/11/2022   HCT 43.6 09/11/2022   MCV 98.2 09/11/2022   PLT 164 09/11/2022      Chemistry      Component Value Date/Time   NA 139 09/11/2022 0934   K 3.9 09/11/2022 0934   CL 104 09/11/2022 0934   CO2 32 09/11/2022 0934   BUN 16 09/11/2022 0934   CREATININE 1.15 09/11/2022 0934      Component Value Date/Time   CALCIUM 9.1 09/11/2022 0934   ALKPHOS 79 09/11/2022 0934   AST 19 09/11/2022 0934   ALT 17 09/11/2022 0934   BILITOT 1.0 09/11/2022 0934       RADIOGRAPHIC STUDIES:  No results found.   ASSESSMENT/PLAN:  No problem-specific Assessment & Plan notes found for this encounter.   No orders of the defined types were placed in this encounter.    I spent {CHL ONC TIME VISIT - ZOXWR:[id number]} counseling the patient face to face. The total time spent in the appointment was {CHL ONC TIME VISIT - BJYNW:[id number]}.  Jakyren Fluegge  L Jameel Quant, PA-C 10/06/22

## 2022-10-07 ENCOUNTER — Encounter (HOSPITAL_BASED_OUTPATIENT_CLINIC_OR_DEPARTMENT_OTHER): Payer: Self-pay | Admitting: Urology

## 2022-10-07 ENCOUNTER — Other Ambulatory Visit: Payer: Self-pay

## 2022-10-08 SURGERY — Surgical Case
Anesthesia: *Unknown

## 2022-10-08 NOTE — Progress Notes (Signed)
Called pt to do pre-op interview for surgery on 10-21-2022 by Dr Arita Miss @ [facility].  Pt stated he thought surgery was cancelled about three weeks ago. Looked up pt under pt station noted pt was scheduled on 10-08-2022 and was cancelled.  Then pt procedure re-posted on 09-09-2022 for 10-21-2022.  Pt stated he would like time to call the office and speak with doctor before moving forward.   Pt agreed if his procedure is still on 10-21-2022 in will call him on Wednesday 10-15-2022 to complete pre-op interview.

## 2022-10-09 ENCOUNTER — Inpatient Hospital Stay: Payer: BC Managed Care – PPO | Admitting: Physician Assistant

## 2022-10-09 ENCOUNTER — Inpatient Hospital Stay: Payer: BC Managed Care – PPO | Attending: Internal Medicine

## 2022-10-09 ENCOUNTER — Ambulatory Visit: Payer: BC Managed Care – PPO | Admitting: Internal Medicine

## 2022-10-09 ENCOUNTER — Other Ambulatory Visit: Payer: Self-pay

## 2022-10-09 ENCOUNTER — Inpatient Hospital Stay: Payer: BC Managed Care – PPO

## 2022-10-09 VITALS — BP 149/76 | HR 65 | Resp 16

## 2022-10-09 DIAGNOSIS — I10 Essential (primary) hypertension: Secondary | ICD-10-CM | POA: Insufficient documentation

## 2022-10-09 DIAGNOSIS — L299 Pruritus, unspecified: Secondary | ICD-10-CM | POA: Insufficient documentation

## 2022-10-09 DIAGNOSIS — D492 Neoplasm of unspecified behavior of bone, soft tissue, and skin: Secondary | ICD-10-CM | POA: Diagnosis not present

## 2022-10-09 DIAGNOSIS — C384 Malignant neoplasm of pleura: Secondary | ICD-10-CM | POA: Insufficient documentation

## 2022-10-09 DIAGNOSIS — C61 Malignant neoplasm of prostate: Secondary | ICD-10-CM | POA: Diagnosis not present

## 2022-10-09 DIAGNOSIS — R918 Other nonspecific abnormal finding of lung field: Secondary | ICD-10-CM

## 2022-10-09 DIAGNOSIS — Z5111 Encounter for antineoplastic chemotherapy: Secondary | ICD-10-CM | POA: Diagnosis not present

## 2022-10-09 DIAGNOSIS — Z95828 Presence of other vascular implants and grafts: Secondary | ICD-10-CM

## 2022-10-09 DIAGNOSIS — C3491 Malignant neoplasm of unspecified part of right bronchus or lung: Secondary | ICD-10-CM | POA: Diagnosis present

## 2022-10-09 LAB — CMP (CANCER CENTER ONLY)
ALT: 11 U/L (ref 0–44)
AST: 19 U/L (ref 15–41)
Albumin: 3.7 g/dL (ref 3.5–5.0)
Alkaline Phosphatase: 85 U/L (ref 38–126)
Anion gap: 3 — ABNORMAL LOW (ref 5–15)
BUN: 16 mg/dL (ref 8–23)
CO2: 30 mmol/L (ref 22–32)
Calcium: 9.4 mg/dL (ref 8.9–10.3)
Chloride: 109 mmol/L (ref 98–111)
Creatinine: 1.14 mg/dL (ref 0.61–1.24)
GFR, Estimated: >60 mL/min (ref >60)
Glucose, Bld: 87 mg/dL (ref 70–99)
Potassium: 3.8 mmol/L (ref 3.5–5.1)
Sodium: 142 mmol/L (ref 135–145)
Total Bilirubin: 0.5 mg/dL (ref 0.3–1.2)
Total Protein: 6.6 g/dL (ref 6.5–8.1)

## 2022-10-09 LAB — CBC WITH DIFFERENTIAL (CANCER CENTER ONLY)
Abs Immature Granulocytes: 0.02 10*3/uL (ref 0.00–0.07)
Basophils Absolute: 0 10*3/uL (ref 0.0–0.1)
Basophils Relative: 0 %
Eosinophils Absolute: 0.2 10*3/uL (ref 0.0–0.5)
Eosinophils Relative: 4 %
HCT: 39.4 % (ref 39.0–52.0)
Hemoglobin: 12.9 g/dL — ABNORMAL LOW (ref 13.0–17.0)
Immature Granulocytes: 0 %
Lymphocytes Relative: 12 %
Lymphs Abs: 0.6 10*3/uL — ABNORMAL LOW (ref 0.7–4.0)
MCH: 31.8 pg (ref 26.0–34.0)
MCHC: 32.7 g/dL (ref 30.0–36.0)
MCV: 97 fL (ref 80.0–100.0)
Monocytes Absolute: 0.7 10*3/uL (ref 0.1–1.0)
Monocytes Relative: 13 %
Neutro Abs: 3.4 10*3/uL (ref 1.7–7.7)
Neutrophils Relative %: 71 %
Platelet Count: 179 10*3/uL (ref 150–400)
RBC: 4.06 MIL/uL — ABNORMAL LOW (ref 4.22–5.81)
RDW: 13.2 % (ref 11.5–15.5)
WBC Count: 4.9 10*3/uL (ref 4.0–10.5)
nRBC: 0 % (ref 0.0–0.2)

## 2022-10-09 LAB — TOTAL PROTEIN, URINE DIPSTICK: Protein, ur: NEGATIVE mg/dL

## 2022-10-09 MED ORDER — SODIUM CHLORIDE 0.9% FLUSH
10.0000 mL | Freq: Once | INTRAVENOUS | Status: AC
  Administered 2022-10-09: 10 mL

## 2022-10-09 MED ORDER — SODIUM CHLORIDE 0.9 % IV SOLN
5.0000 mg/kg | Freq: Once | INTRAVENOUS | Status: AC
  Administered 2022-10-09: 400 mg via INTRAVENOUS
  Filled 2022-10-09: qty 16

## 2022-10-09 MED ORDER — HEPARIN SOD (PORK) LOCK FLUSH 100 UNIT/ML IV SOLN
500.0000 [IU] | Freq: Once | INTRAVENOUS | Status: AC | PRN
  Administered 2022-10-09: 500 [IU]

## 2022-10-09 MED ORDER — SODIUM CHLORIDE 0.9% FLUSH
10.0000 mL | INTRAVENOUS | Status: DC | PRN
  Administered 2022-10-09: 10 mL

## 2022-10-09 MED ORDER — SODIUM CHLORIDE 0.9 % IV SOLN: Freq: Once | INTRAVENOUS | Status: AC

## 2022-10-09 NOTE — Patient Instructions (Signed)
[facility]  Discharge Instructions: Thank you for choosing [facility] to provide your oncology and hematology care.   If you have a lab appointment with the Cancer Center, please go directly to the Cancer Center and check in at the registration area.   Wear comfortable clothing and clothing appropriate for easy access to any Portacath or PICC line.   We strive to give you quality time with your provider. You may need to reschedule your appointment if you arrive late (15 or more minutes).  Arriving late affects you and other patients whose appointments are after yours.  Also, if you miss three or more appointments without notifying the office, you may be dismissed from the clinic at the provider's discretion.      For prescription refill requests, have your pharmacy contact our office and allow 72 hours for refills to be completed.    Today you received the following chemotherapy and/or immunotherapy agents: Bevacizumab      To help prevent nausea and vomiting after your treatment, we encourage you to take your nausea medication as directed.  BELOW ARE SYMPTOMS THAT SHOULD BE REPORTED IMMEDIATELY: *FEVER GREATER THAN 100.4 F (38 C) OR HIGHER *CHILLS OR SWEATING *NAUSEA AND VOMITING THAT IS NOT CONTROLLED WITH YOUR NAUSEA MEDICATION *UNUSUAL SHORTNESS OF BREATH *UNUSUAL BRUISING OR BLEEDING *URINARY PROBLEMS (pain or burning when urinating, or frequent urination) *BOWEL PROBLEMS (unusual diarrhea, constipation, pain near the anus) TENDERNESS IN MOUTH AND THROAT WITH OR WITHOUT PRESENCE OF ULCERS (sore throat, sores in mouth, or a toothache) UNUSUAL RASH, SWELLING OR PAIN  UNUSUAL VAGINAL DISCHARGE OR ITCHING   Items with * indicate a potential emergency and should be followed up as soon as possible or go to the Emergency Department if any problems should occur.  Please show the CHEMOTHERAPY ALERT CARD or IMMUNOTHERAPY ALERT CARD at  check-in to the Emergency Department and triage nurse.  Should you have questions after your visit or need to cancel or reschedule your appointment, please contact [facility]  Dept: [phone]  and follow the prompts.  Office hours are 8:00 a.m. to 4:30 p.m. Monday - Friday. Please note that voicemails left after 4:00 p.m. may not be returned until the following business day.  We are closed weekends and major holidays. You have access to a nurse at all times for urgent questions. Please call the main number to the clinic Dept: [phone] and follow the prompts.   For any non-urgent questions, you may also contact your provider using MyChart. We now offer e-Visits for anyone 18 and older to request care online for non-urgent symptoms. For details visit [email or web address].   Also download the MyChart app! Go to the app store, search "MyChart", open the app, select [facility], and log in with your MyChart username and password.   

## 2022-10-13 ENCOUNTER — Other Ambulatory Visit: Payer: Self-pay | Admitting: Urology

## 2022-10-13 MED ORDER — FLEET ENEMA 7-19 GM/118ML RE ENEM: 1.0000 | ENEMA | Freq: Once | RECTAL | Status: AC

## 2022-10-13 NOTE — Progress Notes (Addendum)
Left voicemail message for patient to call pre op RN at [phone] if still having surgery on 10-21-2022  Addendum: jessica left message pt is to have surgery on 10-21-2022

## 2022-10-15 ENCOUNTER — Encounter (HOSPITAL_BASED_OUTPATIENT_CLINIC_OR_DEPARTMENT_OTHER): Payer: Self-pay | Admitting: Urology

## 2022-10-15 NOTE — Progress Notes (Signed)
Spoke w/ via phone for pre-op interview--- pt Lab needs dos----   [facility] results------ current EKG in epic/ chart COVID test -----patient states asymptomatic no test needed Arrive at ------- 0530 on 10-21-2022 NPO after MN NO Solid Food.  Clear liquids from MN until--- 0430 Med rec completed Medications to take morning of surgery ----- norvasc Diabetic medication ----- Patient instructed no nail polish to be worn day of surgery Patient instructed to bring photo id and insurance card day of surgery Patient aware to have Driver (ride ) / caregiver    for 24 hours after surgery -- spouse, Mathew [name] Patient Special Instructions ----- will do one fleet enema night before surgery Pre-Op special Instructions ----- n/a Patient verbalized understanding of instructions that were given at this phone interview. Patient denies shortness of breath, chest pain, fever, cough at this phone interview.

## 2022-10-17 NOTE — H&P (Signed)
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 Unfortunately, on CT chest from April 2023 he was found to have significant progression of his disease. 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 He currently is managed by Dr. Cherylann Ratel at [facility] and is on Bevacizumb/Temozolomide. Most recent cross-sectional imaging with CT from 06/12/2022 shows stable to slightly increased multifocal malignant masses within the right upper quadrant/right pleura as well as some possible internal necrosis potentially related to treatment effect. No lymphadenopathy. I am unable to personally review the images as they are in the [facility] system.   I have been following Mathew [name] for a elevated PSA. PSA was 2.1 in October 2020, 3.8 in October 2021, 5 in October 2020, 5.4(9% free) in January 2023, and most recently 8.1 in April 2023. Calculated PSA doubling time is 18 months.   Using shared decision making we had opted for a prostate MRI. I personally viewed and interpreted those images dated 01/10/2022 that show a 24 g gland with a 2 cm PI-RADS category 5 lesion concerning for prostate cancer in the right posterior lateral peripheral zone in the mid gland and apex.   Patient is currently scheduled for prostate biopsy in 07/16/2022. He will hold oral chemotherapy this week as well as next week and not have his infusion next week as previously planned.   10/13/2022: Mathew [name] is a 62 year old man who presents for preoperative appointment prior to undergoing fiducial markers as well as SpaceOAR.      ALLERGIES: penicillin Penicillins    MEDICATIONS: Albuterol Sulfate Hfa 90 mcg hfa aerosol with adapter  Amlodipine Besylate 10 mg tablet  Creatinine 100 % powder Does Not Apply  Fe Fum-Vit C-Vit B12  Hydrocodone Bit-Homatropine 5-1.5 Mg/5 Ml Syrup  L-Arginine Base 100 % powder Does Not Apply  Prednisone 5 mg tablet  Temozolomide 5 mg capsule     GU PSH: Prostate Needle Biopsy - 07/16/2022       PSH Notes: Resection of solitary pulmonary fibrous tumor (2016, 2021, 2023)  Knee Surgery Left, Tonsillectomy   NON-GU PSH: Remove Tonsils - 2015 Surgical Pathology, Gross And Microscopic Examination For Prostate Needle - 07/16/2022     GU PMH: Abnormal radiologic findings on diagnostic imaging of other urinary organs - 07/16/2022, - 07/04/2022 Elevated PSA - 07/16/2022, - 07/04/2022 Epididymal cyst, Epididymal cyst - 2015      PMH Notes: Solitary pulmonary fibrous rumor (dx: 2016)     NON-GU PMH: Encounter for general adult medical examination without abnormal findings, Encounter for preventive health examination - 2015 Personal history of other diseases of the musculoskeletal system and connective tissue, History of gout - 2015 Personal history of other diseases of the respiratory system, History of asthma - 2015    FAMILY HISTORY: No pertinent family history - Runs In Family   SOCIAL HISTORY: Marital Status: Married    REVIEW OF SYSTEMS:    GU Review Male:   Patient denies frequent urination, hard to postpone urination, burning/ pain with urination, get up at night to urinate, leakage of urine, stream starts and stops, trouble starting your  stream, have to strain to urinate , erection problems, and penile pain.  Gastrointestinal (Upper):   Patient denies nausea, vomiting, and indigestion/ heartburn.  Gastrointestinal (Lower):   Patient denies diarrhea and constipation.  Constitutional:   Patient denies fever, night sweats, weight loss, and fatigue.  Skin:   Patient denies skin  rash/ lesion and itching.  Eyes:   Patient denies blurred vision and double vision.  Musculoskeletal:   Patient denies back pain and joint pain.  Neurological:   Patient denies headaches and dizziness.  Psychologic:   Patient denies depression and anxiety.   VITAL SIGNS:      10/13/2022 01:02 PM  Weight 165 lb / 74.84 kg  Height 69 in / 175.26 cm  BP 136/79 mmHg  Pulse 64 /min  Temperature 97.7 F / 36.5 C  BMI 24.4 kg/m   MULTI-SYSTEM PHYSICAL EXAMINATION:    Constitutional: Well-nourished. No physical deformities. Normally developed. Good grooming.   Neck: Neck symmetrical, not swollen. Normal tracheal position.  Respiratory: Normal breath sounds. No labored breathing, no use of accessory muscles.   Cardiovascular: Regular rate and rhythm. No murmur, no gallop. Normal temperature, normal extremity pulses, no swelling, no varicosities.   Lymphatic: No enlargement of neck, axillae, groin.  Skin: No paleness, no jaundice, no cyanosis. No lesion, no ulcer, no rash.  Neurologic / Psychiatric: Oriented to time, oriented to place, oriented to person. No depression, no anxiety, no agitation.  Gastrointestinal: No mass, no tenderness, no rigidity, non obese abdomen.  Eyes: Normal conjunctivae. Normal eyelids.  Ears, Nose, Mouth, and Throat: Left ear no scars, no lesions, no masses. Right ear no scars, no lesions, no masses. Nose no scars, no lesions, no masses. Normal hearing. Normal lips.  Musculoskeletal: Normal gait and station of head and neck.     Complexity of Data:  Source Of History:  Patient  Records Review:   Previous Doctor Records, Previous Patient Records  Urine Test Review:   Urinalysis   10/13/22  Urinalysis  Urine Appearance Clear   Urine Color Yellow   Urine Glucose Neg mg/dL  Urine Bilirubin Neg mg/dL  Urine Ketones Neg mg/dL  Urine Specific Gravity 1.025   Urine Blood Neg ery/uL  Urine pH 5.5   Urine Protein Neg mg/dL  Urine Urobilinogen 0.2 mg/dL  Urine  Nitrites Neg   Urine Leukocyte Esterase Neg leu/uL   PROCEDURES:          Urinalysis Dipstick Dipstick Cont'd  Color: Yellow Bilirubin: Neg mg/dL  Appearance: Clear Ketones: Neg mg/dL  Specific Gravity: 1.610 Blood: Neg ery/uL  pH: 5.5 Protein: Neg mg/dL  Glucose: Neg mg/dL Urobilinogen: 0.2 mg/dL    Nitrites: Neg    Leukocyte Esterase: Neg leu/uL    ASSESSMENT:      ICD-10 Details  1 GU:   Abnormal radiologic findings on diagnostic imaging of other urinary organs - R93.49 Chronic, Stable  2   Elevated PSA - R97.20 Chronic, Stable   PLAN:           Document Letter(s):  Created for Patient: Clinical Summary         Notes:   Urinalysis is clear. All of his questions were answered to the best of my ability. He will keep his upcoming appointment as scheduled for fiducial markers and SpaceOAR on 10/21/2022.

## 2022-10-20 ENCOUNTER — Encounter: Payer: Self-pay | Admitting: Internal Medicine

## 2022-10-20 ENCOUNTER — Other Ambulatory Visit: Payer: Self-pay | Admitting: Physician Assistant

## 2022-10-20 NOTE — Progress Notes (Signed)
RN spoke with patient and provided education/answers on upcoming treatment appointments.   No additional needs at this time.

## 2022-10-21 ENCOUNTER — Encounter (HOSPITAL_BASED_OUTPATIENT_CLINIC_OR_DEPARTMENT_OTHER): Payer: Self-pay | Admitting: Urology

## 2022-10-21 ENCOUNTER — Encounter (HOSPITAL_BASED_OUTPATIENT_CLINIC_OR_DEPARTMENT_OTHER)
Admission: RE | Disposition: A | Discharge status: Home / Self Care | Payer: Self-pay | Source: Home / Self Care | Attending: Urology

## 2022-10-21 ENCOUNTER — Other Ambulatory Visit: Payer: Self-pay

## 2022-10-21 ENCOUNTER — Ambulatory Visit (HOSPITAL_BASED_OUTPATIENT_CLINIC_OR_DEPARTMENT_OTHER): Payer: BC Managed Care – PPO

## 2022-10-21 ENCOUNTER — Telehealth: Payer: Self-pay | Admitting: *Deleted

## 2022-10-21 ENCOUNTER — Ambulatory Visit (HOSPITAL_BASED_OUTPATIENT_CLINIC_OR_DEPARTMENT_OTHER)
Admission: RE | Admit: 2022-10-21 | Discharge: 2022-10-21 | Disposition: A | Discharge status: Home / Self Care | Payer: BC Managed Care – PPO | Attending: Urology | Admitting: Urology

## 2022-10-21 ENCOUNTER — Other Ambulatory Visit (HOSPITAL_COMMUNITY): Payer: BC Managed Care – PPO

## 2022-10-21 DIAGNOSIS — C782 Secondary malignant neoplasm of pleura: Secondary | ICD-10-CM | POA: Diagnosis not present

## 2022-10-21 DIAGNOSIS — Z7969 Long term (current) use of other immunomodulators and immunosuppressants: Secondary | ICD-10-CM | POA: Insufficient documentation

## 2022-10-21 DIAGNOSIS — C61 Malignant neoplasm of prostate: Secondary | ICD-10-CM | POA: Diagnosis present

## 2022-10-21 DIAGNOSIS — I10 Essential (primary) hypertension: Secondary | ICD-10-CM | POA: Insufficient documentation

## 2022-10-21 DIAGNOSIS — Z01818 Encounter for other preprocedural examination: Secondary | ICD-10-CM

## 2022-10-21 HISTORY — PX: GOLD SEED IMPLANT: SHX6343

## 2022-10-21 HISTORY — DX: Rash and other nonspecific skin eruption: R21

## 2022-10-21 HISTORY — DX: Unspecified osteoarthritis, unspecified site: M19.90

## 2022-10-21 HISTORY — DX: Encounter for antineoplastic chemotherapy: Z51.11

## 2022-10-21 HISTORY — PX: SPACE OAR INSTILLATION: SHX6769

## 2022-10-21 HISTORY — DX: Anemia, unspecified: D64.9

## 2022-10-21 HISTORY — DX: Essential (primary) hypertension: I10

## 2022-10-21 HISTORY — DX: Hypoglycemia, unspecified: E16.2

## 2022-10-21 HISTORY — DX: Presence of external hearing-aid: Z97.4

## 2022-10-21 LAB — POCT I-STAT, CHEM 8
BUN: 15 mg/dL (ref 8–23)
Calcium, Ion: 1.27 mmol/L (ref 1.15–1.40)
Chloride: 104 mmol/L (ref 98–111)
Creatinine, Ser: 1.2 mg/dL (ref 0.61–1.24)
Glucose, Bld: 90 mg/dL (ref 70–99)
HCT: 42 % (ref 39.0–52.0)
Hemoglobin: 14.3 g/dL (ref 13.0–17.0)
Potassium: 3.5 mmol/L (ref 3.5–5.1)
Sodium: 143 mmol/L (ref 135–145)
TCO2: 28 mmol/L (ref 22–32)

## 2022-10-21 SURGERY — INSERTION, GOLD SEEDS
Anesthesia: Monitor Anesthesia Care | Site: Prostate

## 2022-10-21 MED ORDER — DEXMEDETOMIDINE HCL IN NACL 80 MCG/20ML IV SOLN
INTRAVENOUS | Status: DC | PRN
  Administered 2022-10-21: 8 ug via INTRAVENOUS

## 2022-10-21 MED ORDER — SODIUM CHLORIDE (PF) 0.9 % IJ SOLN
INTRAMUSCULAR | Status: DC | PRN
  Administered 2022-10-21: 10 mL

## 2022-10-21 MED ORDER — AMISULPRIDE (ANTIEMETIC) 5 MG/2ML IV SOLN: 10.0000 mg | Freq: Once | INTRAVENOUS | Status: DC | PRN

## 2022-10-21 MED ORDER — FENTANYL CITRATE (PF) 100 MCG/2ML IJ SOLN
INTRAMUSCULAR | Status: AC
  Filled 2022-10-21: qty 2

## 2022-10-21 MED ORDER — CIPROFLOXACIN IN D5W 400 MG/200ML IV SOLN
INTRAVENOUS | Status: AC
  Filled 2022-10-21: qty 200

## 2022-10-21 MED ORDER — ACETAMINOPHEN 500 MG PO TABS
ORAL_TABLET | ORAL | Status: AC
  Filled 2022-10-21: qty 2

## 2022-10-21 MED ORDER — DEXMEDETOMIDINE HCL IN NACL 80 MCG/20ML IV SOLN
INTRAVENOUS | Status: AC
  Filled 2022-10-21: qty 20

## 2022-10-21 MED ORDER — CIPROFLOXACIN IN D5W 400 MG/200ML IV SOLN
400.0000 mg | Freq: Two times a day (BID) | INTRAVENOUS | Status: DC
  Administered 2022-10-21: 400 mg via INTRAVENOUS

## 2022-10-21 MED ORDER — ACETAMINOPHEN 500 MG PO TABS
1000.0000 mg | ORAL_TABLET | Freq: Once | ORAL | Status: AC
  Administered 2022-10-21: 1000 mg via ORAL

## 2022-10-21 MED ORDER — GLYCOPYRROLATE PF 0.2 MG/ML IJ SOSY
PREFILLED_SYRINGE | INTRAMUSCULAR | Status: AC
  Filled 2022-10-21: qty 1

## 2022-10-21 MED ORDER — ONDANSETRON HCL 4 MG/2ML IJ SOLN: 4.0000 mg | Freq: Once | INTRAMUSCULAR | Status: DC | PRN

## 2022-10-21 MED ORDER — LACTATED RINGERS IV SOLN: INTRAVENOUS | Status: DC

## 2022-10-21 MED ORDER — GLYCOPYRROLATE 0.2 MG/ML IJ SOLN
INTRAMUSCULAR | Status: DC | PRN
  Administered 2022-10-21: .1 mg via INTRAVENOUS

## 2022-10-21 MED ORDER — BUPIVACAINE HCL 0.25 % IJ SOLN
INTRAMUSCULAR | Status: DC | PRN
  Administered 2022-10-21: 10 mL

## 2022-10-21 MED ORDER — OXYCODONE HCL 5 MG/5ML PO SOLN: 5.0000 mg | Freq: Once | ORAL | Status: DC | PRN

## 2022-10-21 MED ORDER — MIDAZOLAM HCL 5 MG/5ML IJ SOLN
INTRAMUSCULAR | Status: DC | PRN
  Administered 2022-10-21: 2 mg via INTRAVENOUS

## 2022-10-21 MED ORDER — MIDAZOLAM HCL 2 MG/2ML IJ SOLN
INTRAMUSCULAR | Status: AC
  Filled 2022-10-21: qty 2

## 2022-10-21 MED ORDER — OXYCODONE HCL 5 MG PO TABS: 5.0000 mg | ORAL_TABLET | Freq: Once | ORAL | Status: DC | PRN

## 2022-10-21 MED ORDER — PROPOFOL 500 MG/50ML IV EMUL
INTRAVENOUS | Status: AC
  Filled 2022-10-21: qty 50

## 2022-10-21 MED ORDER — PROPOFOL 500 MG/50ML IV EMUL
INTRAVENOUS | Status: DC | PRN
  Administered 2022-10-21: 150 ug/kg/min via INTRAVENOUS

## 2022-10-21 MED ORDER — FENTANYL CITRATE (PF) 100 MCG/2ML IJ SOLN: 25.0000 ug | INTRAMUSCULAR | Status: DC | PRN

## 2022-10-21 MED ORDER — FENTANYL CITRATE (PF) 100 MCG/2ML IJ SOLN
INTRAMUSCULAR | Status: DC | PRN
  Administered 2022-10-21: 50 ug via INTRAVENOUS

## 2022-10-21 SURGICAL SUPPLY — 26 items
BLADE CLIPPER SENSICLIP SURGIC (BLADE) ×2 IMPLANT
CNTNR URN SCR LID CUP LEK RST (MISCELLANEOUS) ×2 IMPLANT
CONT SPEC 4OZ STRL OR WHT (MISCELLANEOUS) ×1
COVER BACK TABLE 60X90IN (DRAPES) ×2 IMPLANT
DRSG TEGADERM 4X4.75 (GAUZE/BANDAGES/DRESSINGS) ×2 IMPLANT
DRSG TEGADERM 8X12 (GAUZE/BANDAGES/DRESSINGS) ×2 IMPLANT
GAUZE SPONGE 4X4 12PLY STRL (GAUZE/BANDAGES/DRESSINGS) ×2 IMPLANT
GLOVE BIO SURGEON STRL SZ 6.5 (GLOVE) ×2 IMPLANT
GLOVE BIO SURGEON STRL SZ7.5 (GLOVE) ×2 IMPLANT
GLOVE ECLIPSE 8.0 STRL XLNG CF (GLOVE) ×2 IMPLANT
IMPL SPACEOAR VUE SYSTEM (Spacer) ×2 IMPLANT
IMPLANT SPACEOAR VUE SYSTEM (Spacer) ×1 IMPLANT
KIT TURNOVER CYSTO (KITS) ×2 IMPLANT
MARKER GOLD PRELOAD 1.2X3 (Urological Implant) ×2 IMPLANT
MARKER SKIN DUAL TIP RULER LAB (MISCELLANEOUS) ×2 IMPLANT
NDL SPNL 22GX3.5 QUINCKE BK (NEEDLE) ×2 IMPLANT
NEEDLE SPNL 22GX3.5 QUINCKE BK (NEEDLE) ×1 IMPLANT
SEED GOLD PRELOAD 1.2X3 (Urological Implant) ×1 IMPLANT
SHEATH ULTRASOUND LF (SHEATH) IMPLANT
SHEATH ULTRASOUND LTX NONSTRL (SHEATH) IMPLANT
SLEEVE SCD COMPRESS KNEE MED (STOCKING) ×2 IMPLANT
SURGILUBE 2OZ TUBE FLIPTOP (MISCELLANEOUS) ×2 IMPLANT
SYR 10ML LL (SYRINGE) IMPLANT
SYR CONTROL 10ML LL (SYRINGE) ×2 IMPLANT
TOWEL OR 17X24 6PK STRL BLUE (TOWEL DISPOSABLE) ×2 IMPLANT
UNDERPAD 30X36 HEAVY ABSORB (UNDERPADS AND DIAPERS) ×2 IMPLANT

## 2022-10-21 NOTE — Anesthesia Postprocedure Evaluation (Signed)
Anesthesia Post Note  Patient: Mathew [name]  Procedure(s) Performed: GOLD SEED IMPLANT (Prostate) SPACE OAR INSTILLATION (Prostate)     Patient location during evaluation: PACU Anesthesia Type: MAC Level of consciousness: awake and alert Pain management: pain level controlled Vital Signs Assessment: post-procedure vital signs reviewed and stable Respiratory status: spontaneous breathing, nonlabored ventilation and respiratory function stable Cardiovascular status: blood pressure returned to baseline and stable Postop Assessment: no apparent nausea or vomiting Anesthetic complications: no   No notable events documented.  Last Vitals:  Vitals:   10/21/22 0804 10/21/22 0815  BP: 105/61 104/70  Pulse: (!) 52 (!) 50  Resp: 15 13  Temp: (!) 36.4 C   SpO2: 100% 100%    Last Pain:  Vitals:   10/21/22 0815  TempSrc:   PainSc: Asleep                 Lannie Fields

## 2022-10-21 NOTE — Discharge Instructions (Addendum)
DISCHARGE INSTRUCTIONS FOR GOLD MARKERS and SPACEOAR  Antibiotics You may be given a prescription for an antibiotic to take when you arrive home. If so, be sure to take every tablet in the bottle, even if you are feeling better before the prescription is finished. If you begin itching, notice a rash or start to swell on your trunk, arms, legs and/or throat, immediately stop taking the antibiotic and call your Urologist.  Diet Resume your usual diet when you return home. To keep your bowels moving easily and softly, drink prune, apple and cranberry juice at room temperature. You may also take a stool softener, such as Colace, which is available without prescription at local pharmacies.  Daily activities No driving or heavy lifting for at least two days after the implant. No bike riding, horseback riding or riding lawn mowers for the first month after the implant. Any strenuous physical activity should be approved by your doctor before you resume it.  Postoperative swelling Expect swelling and bruising of the scrotum and perineum (the area between the scrotum and anus). Both the swelling and the bruising should resolve in l or 2 weeks. Ice packs and over- the-counter medications such as Tylenol, Advil or Aleve may lessen your discomfort.  Postoperative urination Most men experience burning on urination and/or urinary frequency. If this becomes bothersome, contact your Urologist.  Medication can be prescribed to relieve these problems.  It is normal to have some blood in your urine for a few days after the implant.   Contact your doctor for Temperature greater than 101 F Increasing pain Inability to urinate  Follow-up  You should have follow up with your urologist and radiation oncologist about 3 weeks after the procedure.        No acetaminophen/Tylenol until after 12:00 pm today if needed.   Post Anesthesia Home Care Instructions  Activity: Get plenty of rest for the remainder  of the day. A responsible individual must stay with you for 24 hours following the procedure.  For the next 24 hours, DO NOT: -Drive a car -Advertising copywriter -Drink alcoholic beverages -Take any medication unless instructed by your physician -Make any legal decisions or sign important papers.  Meals: Start with liquid foods such as gelatin or soup. Progress to regular foods as tolerated. Avoid greasy, spicy, heavy foods. If nausea and/or vomiting occur, drink only clear liquids until the nausea and/or vomiting subsides. Call your physician if vomiting continues.  Special Instructions/Symptoms: Your throat may feel dry or sore from the anesthesia or the breathing tube placed in your throat during surgery. If this causes discomfort, gargle with warm salt water. The discomfort should disappear within 24 hours.

## 2022-10-21 NOTE — Anesthesia Procedure Notes (Signed)
Procedure Name: MAC Date/Time: 10/21/2022 7:31 AM  Performed by: Lannie Fields, DOPre-anesthesia Checklist: Patient identified, Emergency Drugs available, Suction available and Patient being monitored Oxygen Delivery Method: Nasal cannula Placement Confirmation: positive ETCO2 Dental Injury: Teeth and Oropharynx as per pre-operative assessment  Comments: Optiflow 40 L

## 2022-10-21 NOTE — Op Note (Signed)
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 gentleman with clinically localized prostate cancer. After discussing management options for treatment, he elected to proceed with radiotherapy. He presents today for the above procedures. The potential risks, complications, alternative options, and expected recovery course have been discussed in detail with the patient and he has provided informed consent to proceed.  Description of procedure: The patient was administered preoperative antibiotics, placed in the dorsal lithotomy position, and prepped and draped in the usual sterile fashion. Next, transrectal ultrasonography was utilized to visualize the prostate. Three gold fiducial markers were then placed into the prostate via transperineal needles under ultrasound guidance at the right apex, right base, and left mid gland under direct ultrasound guidance. A site in the midline was then selected on the perineum for placement of an 18 g needle with saline. The needle was advanced above the rectum and below Denonvillier's fascia to the mid gland and confirmed to be in the midline on transverse imaging. One cc of saline was injected confirming appropriate expansion of this space. A total of 5 cc of saline was then injected to open the space further bilaterally. The saline syringe was then removed and the SpaceOAR hydrogel was injected with good distribution bilaterally. He tolerated the procedure well and without complications. He was given a voiding trial prior to discharge from the PACU.

## 2022-10-21 NOTE — Telephone Encounter (Signed)
CALLED PATIENT TO REMIND OF SIM APPT. FOR 10-23-22- ARRIVAL TIME- 8:45 AM @ [facility], INFORMED PATIENT TO ARRIVE WITH A FULL BLADDER, SPOKE WITH PATIENT AND HE IS AWARE OF THIS APPT. AND THE INSTRUCTIONS

## 2022-10-21 NOTE — Interval H&P Note (Signed)
History and Physical Interval Note: We talked about risks and benefits of gold seed implants and SpaceOAR installation.  I also discussed with patient that there is a risk but I will not place a SpaceOAR if I cannot get into the correct plane.  He can still have radiation as planned if this happens.  All questions answered.  10/21/2022 7:22 AM  Mathew [name]  has presented today for surgery, with the diagnosis of PROSTATE CANCER.  The various methods of treatment have been discussed with the patient and family. After consideration of risks, benefits and other options for treatment, the patient has consented to  Procedure(s): GOLD SEED IMPLANT (N/A) SPACE OAR INSTILLATION (N/A) as a surgical intervention.  The patient's history has been reviewed, patient examined, no change in status, stable for surgery.  I have reviewed the patient's chart and labs.  Questions were answered to the patient's satisfaction.     Ladaisha Portillo D Belkis Norbeck

## 2022-10-21 NOTE — Transfer of Care (Signed)
Immediate Anesthesia Transfer of Care Note  Patient: Mathew [name]  Procedure(s) Performed: GOLD SEED IMPLANT (Prostate) SPACE OAR INSTILLATION (Prostate)  Patient Location: PACU  Anesthesia Type:MAC  Level of Consciousness: drowsy  Airway & Oxygen Therapy: Patient Spontanous Breathing and Patient connected to nasal cannula oxygen  Post-op Assessment: Report given to RN and Post -op Vital signs reviewed and stable  Post vital signs: Reviewed and stable  Last Vitals:  Vitals Value Taken Time  BP    Temp 36.4 C 10/21/22 0804  Pulse    Resp    SpO2      Last Pain:  Vitals:   10/21/22 0540  TempSrc: Oral         Complications: No notable events documented.

## 2022-10-21 NOTE — Anesthesia Preprocedure Evaluation (Signed)
Anesthesia Evaluation  Patient identified by MRN, date of birth, ID band Patient awake    Reviewed: Allergy & Precautions, H&P , NPO status , Patient's Chart, lab work & pertinent test results  Airway Mallampati: II  TM Distance: >3 FB Neck ROM: Full    Dental no notable dental hx.    Pulmonary  RLL lung ca s/p resection, chemo- metastatic sarcoma    Pulmonary exam normal breath sounds clear to auscultation       Cardiovascular hypertension, Pt. on medications Normal cardiovascular exam Rhythm:Regular Rate:Normal     Neuro/Psych negative neurological ROS  negative psych ROS   GI/Hepatic negative GI ROS, Neg liver ROS,,,  Endo/Other  negative endocrine ROS    Renal/GU negative Renal ROS  negative genitourinary   Musculoskeletal  (+) Arthritis , Osteoarthritis,    Abdominal   Peds negative pediatric ROS (+)  Hematology negative hematology ROS (+)   Anesthesia Other Findings   Reproductive/Obstetrics negative OB ROS                             Anesthesia Physical Anesthesia Plan  ASA: 3  Anesthesia Plan: MAC   Post-op Pain Management:    Induction:   PONV Risk Score and Plan: 2 and Propofol infusion and TIVA  Airway Management Planned: Natural Airway and Simple Face Mask  Additional Equipment: None  Intra-op Plan:   Post-operative Plan:   Informed Consent: I have reviewed the patients History and Physical, chart, labs and discussed the procedure including the risks, benefits and alternatives for the proposed anesthesia with the patient or authorized representative who has indicated his/her understanding and acceptance.       Plan Discussed with: CRNA  Anesthesia Plan Comments:        Anesthesia Quick Evaluation

## 2022-10-22 ENCOUNTER — Inpatient Hospital Stay: Payer: BC Managed Care – PPO

## 2022-10-22 ENCOUNTER — Encounter: Payer: Self-pay | Admitting: Radiation Oncology

## 2022-10-22 ENCOUNTER — Inpatient Hospital Stay: Payer: BC Managed Care – PPO | Admitting: Internal Medicine

## 2022-10-22 ENCOUNTER — Encounter (HOSPITAL_BASED_OUTPATIENT_CLINIC_OR_DEPARTMENT_OTHER): Payer: Self-pay | Admitting: Urology

## 2022-10-22 DIAGNOSIS — D492 Neoplasm of unspecified behavior of bone, soft tissue, and skin: Secondary | ICD-10-CM

## 2022-10-22 NOTE — Progress Notes (Signed)
  Radiation Oncology         ([phone]) [phone] ________________________________  Name: Mathew [name] MRN: [id number]  Date: 10/23/2022  DOB: 06-09-1961  SIMULATION AND TREATMENT PLANNING NOTE    ICD-10-CM   1. Malignant neoplasm of prostate (HCC)  C61       DIAGNOSIS:  62 y.o. gentleman with Stage T1c adenocarcinoma of the prostate with Gleason score of 4+3, and PSA of 10.2.   NARRATIVE:  The patient was brought to the CT Simulation planning suite.  Identity was confirmed.  All relevant records and images related to the planned course of therapy were reviewed.  The patient freely provided informed written consent to proceed with treatment after reviewing the details related to the planned course of therapy. The consent form was witnessed and verified by the simulation staff.  Then, the patient was set-up in a stable reproducible supine position for radiation therapy.  A vacuum lock pillow device was custom fabricated to position his legs in a reproducible immobilized position.  Then, I performed a urethrogram under sterile conditions to identify the prostatic apex.  CT images were obtained.  Surface markings were placed.  The CT images were loaded into the planning software.  Then the prostate target and avoidance structures including the rectum, bladder, bowel and hips were contoured.  Treatment planning then occurred.  The radiation prescription was entered and confirmed.  A total of one complex treatment devices was fabricated. I have requested : Intensity Modulated Radiotherapy (IMRT) is medically necessary for this case for the following reason:  Rectal sparing.Marland Kitchen  PLAN:  The patient will receive 70 Gy in 28 fractions.  ________________________________  Artist Pais Kathrynn Running, M.D.

## 2022-10-22 NOTE — Addendum Note (Signed)
Encounter addended by: Marcello Fennel, PA-C on: 10/22/2022 4:40 PM  Actions taken: Pend clinical note

## 2022-10-23 ENCOUNTER — Ambulatory Visit
Admission: RE | Admit: 2022-10-23 | Discharge: 2022-10-23 | Disposition: A | Discharge status: Home / Self Care | Payer: BC Managed Care – PPO | Source: Ambulatory Visit | Attending: Radiation Oncology | Admitting: Radiation Oncology

## 2022-10-23 DIAGNOSIS — C61 Malignant neoplasm of prostate: Secondary | ICD-10-CM | POA: Insufficient documentation

## 2022-10-23 DIAGNOSIS — Z510 Encounter for antineoplastic radiation therapy: Secondary | ICD-10-CM | POA: Insufficient documentation

## 2022-10-23 DIAGNOSIS — Z5189 Encounter for other specified aftercare: Secondary | ICD-10-CM | POA: Diagnosis not present

## 2022-10-23 NOTE — Addendum Note (Signed)
Encounter addended by: Margaretmary Dys, MD on: 10/23/2022 12:39 PM  Actions taken: Problem List reviewed, Medication List reviewed, Allergies reviewed, Clinical Note Signed

## 2022-10-24 ENCOUNTER — Other Ambulatory Visit: Payer: Self-pay

## 2022-10-26 ENCOUNTER — Other Ambulatory Visit: Payer: Self-pay

## 2022-11-03 NOTE — Progress Notes (Signed)
RN left voicemail for patient to call back if he has any additional needs or questions prior to start of treatment on 11/06/2022.

## 2022-11-04 DIAGNOSIS — C61 Malignant neoplasm of prostate: Secondary | ICD-10-CM | POA: Diagnosis not present

## 2022-11-06 ENCOUNTER — Other Ambulatory Visit: Payer: Self-pay | Admitting: Medical Oncology

## 2022-11-06 ENCOUNTER — Inpatient Hospital Stay: Payer: BC Managed Care – PPO | Attending: Internal Medicine

## 2022-11-06 ENCOUNTER — Other Ambulatory Visit: Payer: BC Managed Care – PPO

## 2022-11-06 ENCOUNTER — Other Ambulatory Visit: Payer: Self-pay

## 2022-11-06 ENCOUNTER — Ambulatory Visit
Admission: RE | Admit: 2022-11-06 | Discharge: 2022-11-06 | Disposition: A | Discharge status: Home / Self Care | Payer: BC Managed Care – PPO | Source: Ambulatory Visit | Attending: Radiation Oncology | Admitting: Radiation Oncology

## 2022-11-06 ENCOUNTER — Encounter: Payer: Self-pay | Admitting: Internal Medicine

## 2022-11-06 ENCOUNTER — Encounter: Payer: Self-pay | Admitting: Medical Oncology

## 2022-11-06 ENCOUNTER — Inpatient Hospital Stay: Payer: BC Managed Care – PPO

## 2022-11-06 ENCOUNTER — Inpatient Hospital Stay (HOSPITAL_BASED_OUTPATIENT_CLINIC_OR_DEPARTMENT_OTHER): Payer: BC Managed Care – PPO | Admitting: Internal Medicine

## 2022-11-06 VITALS — BP 141/84 | HR 63

## 2022-11-06 DIAGNOSIS — D492 Neoplasm of unspecified behavior of bone, soft tissue, and skin: Secondary | ICD-10-CM | POA: Diagnosis not present

## 2022-11-06 DIAGNOSIS — C61 Malignant neoplasm of prostate: Secondary | ICD-10-CM | POA: Diagnosis not present

## 2022-11-06 LAB — RAD ONC ARIA SESSION SUMMARY
Course Elapsed Days: 0
Plan Fractions Treated to Date: 1
Plan Prescribed Dose Per Fraction: 2.5 Gy
Plan Total Fractions Prescribed: 28
Plan Total Prescribed Dose: 70 Gy
Reference Point Dosage Given to Date: 2.5 Gy
Reference Point Session Dosage Given: 2.5 Gy
Session Number: 1

## 2022-11-06 LAB — CBC WITH DIFFERENTIAL (CANCER CENTER ONLY)
Abs Immature Granulocytes: 0.03 10*3/uL (ref 0.00–0.07)
Basophils Absolute: 0 10*3/uL (ref 0.0–0.1)
Basophils Relative: 1 %
Eosinophils Absolute: 0.3 10*3/uL (ref 0.0–0.5)
Eosinophils Relative: 6 %
HCT: 40.4 % (ref 39.0–52.0)
Hemoglobin: 13 g/dL (ref 13.0–17.0)
Immature Granulocytes: 1 %
Lymphocytes Relative: 13 %
Lymphs Abs: 0.6 10*3/uL — ABNORMAL LOW (ref 0.7–4.0)
MCH: 31.2 pg (ref 26.0–34.0)
MCHC: 32.2 g/dL (ref 30.0–36.0)
MCV: 96.9 fL (ref 80.0–100.0)
Monocytes Absolute: 0.4 10*3/uL (ref 0.1–1.0)
Monocytes Relative: 9 %
Neutro Abs: 3.2 10*3/uL (ref 1.7–7.7)
Neutrophils Relative %: 70 %
Platelet Count: 183 10*3/uL (ref 150–400)
RBC: 4.17 MIL/uL — ABNORMAL LOW (ref 4.22–5.81)
RDW: 13.2 % (ref 11.5–15.5)
WBC Count: 4.4 10*3/uL (ref 4.0–10.5)
nRBC: 0 % (ref 0.0–0.2)

## 2022-11-06 LAB — TOTAL PROTEIN, URINE DIPSTICK: Protein, ur: NEGATIVE mg/dL

## 2022-11-06 MED ORDER — SODIUM CHLORIDE 0.9 % IV SOLN
5.0000 mg/kg | Freq: Once | INTRAVENOUS | Status: AC
  Administered 2022-11-06: 400 mg via INTRAVENOUS
  Filled 2022-11-06: qty 16

## 2022-11-06 MED ORDER — SODIUM CHLORIDE 0.9 % IV SOLN: Freq: Once | INTRAVENOUS | Status: AC

## 2022-11-06 MED ORDER — HEPARIN SOD (PORK) LOCK FLUSH 100 UNIT/ML IV SOLN
500.0000 [IU] | Freq: Once | INTRAVENOUS | Status: AC | PRN
  Administered 2022-11-06: 500 [IU]

## 2022-11-06 MED ORDER — SODIUM CHLORIDE 0.9% FLUSH
10.0000 mL | INTRAVENOUS | Status: DC | PRN
  Administered 2022-11-06: 10 mL

## 2022-11-06 NOTE — Progress Notes (Signed)
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  PRIOR THERAPY:  1) status post resection and the patient has recurrence and October 2021 status post resection again under the care of Dr. Dorris Fetch. 2) Sunitinib 3 7.5 mg p.o. daily.  First dose started April 08, 2021.  Status post 3 months of treatment.  This treatment was discontinued secondary to disease progression. 3) Votrient (pazopanib) 800 mg p.o. daily.  Started August 10, 2021.  Status post 2 months of treatment discontinued secondary to disease progression. 4) status post redo right thoracotomy with resection of pleural tumors and lymph node dissection under the care of Dr. Dorris Fetch on Nov 13, 2021. 5) Systemic chemotherapy with doxorubicin 25 Mg/M2, ifosfamide 2500 Mg/M2 and mesna 2500 Mg/M2 days 1-3 every 3 weeks.  First dose February 12, 2022 at [facility] cancer center.  Status post 2 cycles of treatment discontinued secondary to disease progression.   CURRENT THERAPY: He is expected to start treatment with systemic chemotherapy with Temodar 200 Mg/M2 on days 1-7 and day 15-21 with a Avastin 5 Mg/KG on days 8 and 22 every 4 weeks.  Status post 6 cycles  INTERVAL HISTORY: ANOTHNY LEVIS 62 y.o. male is to the clinic today for follow-up visit.  The patient is feeling fine today with no concerning complaints except for fatigue.  He denied having any current chest pain, shortness of breath, cough or hemoptysis.  He has no nausea, vomiting, diarrhea or constipation.  He has no headache or visual changes.  He has no recent weight loss or night sweats.  He has been tolerating history meant well  except for the fatigue.  He had repeat CT scan of the chest, abdomen and pelvis at [facility] cancer center by Dr. Waymon Amato and it showed mild decrease in the size of multiple pleural-based metastasis in the right chest.  He is here today for evaluation before day 8 of cycle #7.    MEDICAL HISTORY: Past Medical History:  Diagnosis Date   Anemia    Clubbing of nails    Deafness in left ear    Encounter for antineoplastic chemotherapy    History of kidney stones    Hypertension    Hypoglycemia due to neoplasm    Malignant neoplasm prostate [facility]) 07/2022   urologist--- dr pace;   bx 01/ 2024, Gleason 4+3   Malignant solitary fibrous neoplasm (HCC) 11/2015   oncologist--- @ [facility] Dr R. 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  MEDICATIONS:  Current Outpatient Medications  Medication Sig Dispense Refill   amLODipine (NORVASC) 10 MG tablet Take  10 mg by mouth daily. (Patient not taking: Reported on 11/06/2022)     bevacizumab-awwb (MVASI) 400 MG/16ML SOLN Inject 5 mg/kg into the vein once.     temozolomide (TEMODAR) 100 MG capsule Take 100 mg by mouth at bedtime.     No current facility-administered medications for this visit.   Facility-Administered Medications Ordered in Other Visits  Medication Dose Route Frequency Provider Last Rate Last Admin   sodium phosphate (FLEET) 7-19 GM/118ML enema 1 enema  1 enema Rectal Once Noel Christmas, MD       sodium phosphate (FLEET) 7-19 GM/118ML enema 1 enema  1 enema Rectal Once Noel Christmas, MD        SURGICAL HISTORY:  Past Surgical History:  Procedure Laterality Date   GOLD SEED IMPLANT N/A 10/21/2022   Procedure: GOLD SEED IMPLANT;  Surgeon: Noel Christmas, MD;  Location: [facility];  Service: Urology;  Laterality: N/A;   INTERCOSTAL NERVE BLOCK Right 04/27/2020   Procedure: INTERCOSTAL NERVE BLOCK;  Surgeon: Loreli Slot, MD;  Location: [facility] OR;  Service: Thoracic;  Laterality: Right;   KNEE CARTILAGE SURGERY Left 1976   RESECTION OF MEDIASTINAL MASS Right 01/23/2016   @[facility]  by dr Dorris Fetch;   resection chest wall mass w/ en bloc wedge resection of RLL   RESECTION OF MEDIASTINAL MASS Right 11/13/2021   Procedure: RESECTION OF PLEURAL TUMORS;  Surgeon: Loreli Slot, MD;  Location: [facility] OR;  Service: Thoracic;  Laterality: Right;   SPACE OAR INSTILLATION N/A 10/21/2022   Procedure: SPACE OAR INSTILLATION;  Surgeon: Noel Christmas, MD;  Location: [facility];  Service: Urology;  Laterality: N/A;   THORACOTOMY Right 04/27/2020   Procedure: THORACOTOMY;  Surgeon: Loreli Slot, MD;  Location: [facility] OR;  Service: Thoracic;  Laterality: Right;   THORACOTOMY Right 11/13/2021   Procedure: REDO THORACOTOMY;  Surgeon: Loreli Slot, MD;  Location: [facility] OR;  Service: Thoracic;  Laterality: Right;   TONSILLECTOMY     child   VIDEO BRONCHOSCOPY WITH ENDOBRONCHIAL ULTRASOUND N/A 12/17/2015   Procedure: VIDEO BRONCHOSCOPY WITH ENDOBRONCHIAL ULTRASOUND;  Surgeon: Loreli Slot, MD;  Location: [facility] OR;  Service: Thoracic;  Laterality: N/A;    REVIEW OF SYSTEMS:  A comprehensive review of systems was negative except for: Constitutional: positive for fatigue   PHYSICAL EXAMINATION: General appearance: alert, cooperative, fatigued, and no distress Head: Normocephalic, without obvious abnormality, atraumatic Neck: no adenopathy, no JVD, supple, symmetrical, trachea midline, and thyroid not enlarged, symmetric, no  tenderness/mass/nodules Lymph nodes: Cervical, supraclavicular, and axillary nodes normal. Resp: clear to auscultation bilaterally Back: symmetric, no curvature. ROM normal. No CVA tenderness. Cardio: regular rate and rhythm, S1, S2 normal, no murmur, click, rub or gallop GI: soft, non-tender; bowel sounds normal; no masses,  no organomegaly Extremities: extremities normal, atraumatic, no cyanosis or edema  ECOG PERFORMANCE STATUS: 1 - Symptomatic but completely ambulatory  Blood pressure (!) 144/96, pulse (!) 58, temperature 97.6 F (36.4 C), temperature source Oral, resp. rate 15, weight 163 lb 14.4 oz (74.3 kg), SpO2 100 %.  LABORATORY DATA: Lab Results  Component Value Date   WBC 4.4 11/06/2022   HGB 13.0 11/06/2022   HCT 40.4 11/06/2022   MCV 96.9 11/06/2022   PLT 183 11/06/2022      Chemistry      Component Value Date/Time   NA 143 10/21/2022 0610   K 3.5 10/21/2022 0610   CL 104 10/21/2022 0610   CO2 30 10/09/2022  0852   BUN 15 10/21/2022 0610   CREATININE 1.20 10/21/2022 0610   CREATININE 1.14 10/09/2022 0852      Component Value Date/Time   CALCIUM 9.4 10/09/2022 0852   ALKPHOS 85 10/09/2022 0852   AST 19 10/09/2022 0852   ALT 11 10/09/2022 0852   BILITOT 0.5 10/09/2022 0852       RADIOGRAPHIC STUDIES: No results found.  ASSESSMENT AND PLAN: This is a very pleasant 62 years old male with recurrent solitary fibrous tumor of the right lung diagnosed in June 2017 s/p resection followed by recurrence in October 2021 status post reresection under the care of Dr. Dorris Fetch. The patient has been on observation since his resection.  There was no role for adjuvant therapy for his condition.  He was seen by Dr. Lin Givens at [facility] who recommended for him observation and consideration of treatment with sunitinib followed by surgery if the patient has disease recurrence He had repeat CT scan of the chest performed recently.  The scan showed stable disease  except for a new 1.4 cm nodular component posteriorly. The patient is currently undergoing treatment with sunitinib 37.5 mg p.o. daily status post 3 months of treatment.  The patient has been tolerating his treatment with Sutent fairly well except for the hypertension. Unfortunately has a scan showed evidence for disease progression with interval development of multiple new pleural-based nodules in the right hemithorax measuring up to 2.9 cm concerning for recurrent/metastatic disease. The patient was seen recently by Dr. Dorris Fetch who offered surgical resection by Dr. Lin Givens recommended neoadjuvant treatment with pazopanib for 8 weeks followed by surgical resection if the patient has a stable or improvement of his disease followed by adjuvant treatment with Pazopanib for another 6-8 months. He was treated with pazopanib 800 mg p.o. daily.  He is status post 2 months of treatment. Repeat CT scan showed clear evidence for disease progression with enlarging right pleural-based masses. The patient was referred to Dr. 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  I sent the tissue block to [facility] for molecular studies and PD-L1 expression.  Unfortunately the molecular studies showed no actionable mutations and his PD-L1 expression was negative. The patient was seen recently by Dr. Waymon Amato at [facility] cancer center and he received treatment with inpatient systemic chemotherapy with doxorubicin, ifosfamide and mesna every 3 weeks.  Status post 2 cycles.  He had fatigue and pancytopenia with this treatment. His treatment was discontinued secondary to disease progression. Dr. Waymon Amato recommended for the patient treatment with Temodar 200 Mg/M2 on days 1-7 and 15-21 in addition to  Avastin 5 Mg/M2 on days 8 and 22 every 4 weeks.  Status post 6 cycles. The patient has been tolerating his treatment fairly well with no concerning adverse effect except for fatigue. He had CT scan of the chest, abdomen and pelvis performed at [facility] and it showed improvement of his disease. I recommended for him to continue his current treatment and he will receive a Avastin for day 8 of cycle #7 today. He will come back for follow-up visit in 4 weeks for evaluation before day 8 of cycle #8. For the prostate cancer, he is followed by Dr. Kathrynn Running.  He is starting the first fraction of radiotherapy today. The patient was advised to call immediately  if he has any concerning symptoms in the interval. The patient voices understanding of current disease status and treatment options and is in agreement with the current care plan.  All questions were answered. The patient knows to call the clinic with any problems, questions or concerns. We can certainly see the patient much sooner if necessary.  The total time spent in the appointment was 20 minutes.  Disclaimer: This note was dictated with voice recognition software. Similar sounding words can inadvertently be transcribed and may not be corrected upon review.

## 2022-11-06 NOTE — Patient Instructions (Signed)
[facility]  Discharge Instructions: Thank you for choosing [facility] to provide your oncology and hematology care.   If you have a lab appointment with the Cancer Center, please go directly to the Cancer Center and check in at the registration area.   Wear comfortable clothing and clothing appropriate for easy access to any Portacath or PICC line.   We strive to give you quality time with your provider. You may need to reschedule your appointment if you arrive late (15 or more minutes).  Arriving late affects you and other patients whose appointments are after yours.  Also, if you miss three or more appointments without notifying the office, you may be dismissed from the clinic at the provider's discretion.      For prescription refill requests, have your pharmacy contact our office and allow 72 hours for refills to be completed.    Today you received the following chemotherapy and/or immunotherapy agents: Bevacizumab      To help prevent nausea and vomiting after your treatment, we encourage you to take your nausea medication as directed.  BELOW ARE SYMPTOMS THAT SHOULD BE REPORTED IMMEDIATELY: *FEVER GREATER THAN 100.4 F (38 C) OR HIGHER *CHILLS OR SWEATING *NAUSEA AND VOMITING THAT IS NOT CONTROLLED WITH YOUR NAUSEA MEDICATION *UNUSUAL SHORTNESS OF BREATH *UNUSUAL BRUISING OR BLEEDING *URINARY PROBLEMS (pain or burning when urinating, or frequent urination) *BOWEL PROBLEMS (unusual diarrhea, constipation, pain near the anus) TENDERNESS IN MOUTH AND THROAT WITH OR WITHOUT PRESENCE OF ULCERS (sore throat, sores in mouth, or a toothache) UNUSUAL RASH, SWELLING OR PAIN  UNUSUAL VAGINAL DISCHARGE OR ITCHING   Items with * indicate a potential emergency and should be followed up as soon as possible or go to the Emergency Department if any problems should occur.  Please show the CHEMOTHERAPY ALERT CARD or IMMUNOTHERAPY ALERT CARD at  check-in to the Emergency Department and triage nurse.  Should you have questions after your visit or need to cancel or reschedule your appointment, please contact [facility]  Dept: [phone]  and follow the prompts.  Office hours are 8:00 a.m. to 4:30 p.m. Monday - Friday. Please note that voicemails left after 4:00 p.m. may not be returned until the following business day.  We are closed weekends and major holidays. You have access to a nurse at all times for urgent questions. Please call the main number to the clinic Dept: [phone] and follow the prompts.   For any non-urgent questions, you may also contact your provider using MyChart. We now offer e-Visits for anyone 18 and older to request care online for non-urgent symptoms. For details visit [email or web address].   Also download the MyChart app! Go to the app store, search "MyChart", open the app, select [facility], and log in with your MyChart username and password.   

## 2022-11-06 NOTE — Progress Notes (Addendum)
Patient seen by Dr. Gypsy Balsam are within treatment parameters.  Labs reviewed: and are within treatment parameters.  Per physician team, patient is ready for treatment and there are NO modifications to the treatment plan. Per Dr. Arbutus Ped ,pt does not need a CMP today.

## 2022-11-07 ENCOUNTER — Other Ambulatory Visit: Payer: Self-pay

## 2022-11-07 ENCOUNTER — Ambulatory Visit
Admission: RE | Admit: 2022-11-07 | Discharge: 2022-11-07 | Disposition: A | Discharge status: Home / Self Care | Payer: BC Managed Care – PPO | Source: Ambulatory Visit | Attending: Radiation Oncology | Admitting: Radiation Oncology

## 2022-11-07 DIAGNOSIS — C61 Malignant neoplasm of prostate: Secondary | ICD-10-CM | POA: Diagnosis not present

## 2022-11-07 LAB — RAD ONC ARIA SESSION SUMMARY
Course Elapsed Days: 1
Plan Fractions Treated to Date: 2
Plan Prescribed Dose Per Fraction: 2.5 Gy
Plan Total Fractions Prescribed: 28
Plan Total Prescribed Dose: 70 Gy
Reference Point Dosage Given to Date: 5 Gy
Reference Point Session Dosage Given: 2.5 Gy
Session Number: 2

## 2022-11-11 ENCOUNTER — Ambulatory Visit
Admission: RE | Admit: 2022-11-11 | Discharge: 2022-11-11 | Disposition: A | Discharge status: Home / Self Care | Payer: BC Managed Care – PPO | Source: Ambulatory Visit | Attending: Radiation Oncology | Admitting: Radiation Oncology

## 2022-11-11 ENCOUNTER — Other Ambulatory Visit: Payer: Self-pay

## 2022-11-11 DIAGNOSIS — C61 Malignant neoplasm of prostate: Secondary | ICD-10-CM | POA: Diagnosis not present

## 2022-11-11 LAB — RAD ONC ARIA SESSION SUMMARY
Course Elapsed Days: 5
Plan Fractions Treated to Date: 3
Plan Prescribed Dose Per Fraction: 2.5 Gy
Plan Total Fractions Prescribed: 28
Plan Total Prescribed Dose: 70 Gy
Reference Point Dosage Given to Date: 7.5 Gy
Reference Point Session Dosage Given: 2.5 Gy
Session Number: 3

## 2022-11-12 ENCOUNTER — Ambulatory Visit: Payer: BC Managed Care – PPO

## 2022-11-13 ENCOUNTER — Ambulatory Visit
Admission: RE | Admit: 2022-11-13 | Discharge: 2022-11-13 | Disposition: A | Discharge status: Home / Self Care | Payer: BC Managed Care – PPO | Source: Ambulatory Visit | Attending: Radiation Oncology | Admitting: Radiation Oncology

## 2022-11-13 ENCOUNTER — Other Ambulatory Visit: Payer: Self-pay

## 2022-11-13 DIAGNOSIS — C61 Malignant neoplasm of prostate: Secondary | ICD-10-CM | POA: Diagnosis not present

## 2022-11-13 LAB — RAD ONC ARIA SESSION SUMMARY
Course Elapsed Days: 7
Plan Fractions Treated to Date: 4
Plan Prescribed Dose Per Fraction: 2.5 Gy
Plan Total Fractions Prescribed: 28
Plan Total Prescribed Dose: 70 Gy
Reference Point Dosage Given to Date: 10 Gy
Reference Point Session Dosage Given: 2.5 Gy
Session Number: 4

## 2022-11-14 ENCOUNTER — Ambulatory Visit
Admission: RE | Admit: 2022-11-14 | Discharge: 2022-11-14 | Disposition: A | Discharge status: Home / Self Care | Payer: BC Managed Care – PPO | Source: Ambulatory Visit | Attending: Radiation Oncology | Admitting: Radiation Oncology

## 2022-11-14 ENCOUNTER — Other Ambulatory Visit: Payer: Self-pay

## 2022-11-14 DIAGNOSIS — C61 Malignant neoplasm of prostate: Secondary | ICD-10-CM | POA: Diagnosis not present

## 2022-11-14 LAB — RAD ONC ARIA SESSION SUMMARY
Course Elapsed Days: 8
Plan Fractions Treated to Date: 5
Plan Prescribed Dose Per Fraction: 2.5 Gy
Plan Total Fractions Prescribed: 28
Plan Total Prescribed Dose: 70 Gy
Reference Point Dosage Given to Date: 12.5 Gy
Reference Point Session Dosage Given: 2.5 Gy
Session Number: 5

## 2022-11-17 ENCOUNTER — Ambulatory Visit
Admission: RE | Admit: 2022-11-17 | Discharge: 2022-11-17 | Disposition: A | Discharge status: Home / Self Care | Payer: BC Managed Care – PPO | Source: Ambulatory Visit | Attending: Radiation Oncology | Admitting: Radiation Oncology

## 2022-11-17 ENCOUNTER — Other Ambulatory Visit: Payer: Self-pay

## 2022-11-17 DIAGNOSIS — M1990 Unspecified osteoarthritis, unspecified site: Secondary | ICD-10-CM | POA: Diagnosis not present

## 2022-11-17 DIAGNOSIS — Z510 Encounter for antineoplastic radiation therapy: Secondary | ICD-10-CM | POA: Diagnosis not present

## 2022-11-17 DIAGNOSIS — Z5189 Encounter for other specified aftercare: Secondary | ICD-10-CM | POA: Diagnosis not present

## 2022-11-17 DIAGNOSIS — M791 Myalgia, unspecified site: Secondary | ICD-10-CM | POA: Diagnosis not present

## 2022-11-17 DIAGNOSIS — I10 Essential (primary) hypertension: Secondary | ICD-10-CM | POA: Insufficient documentation

## 2022-11-17 DIAGNOSIS — C3491 Malignant neoplasm of unspecified part of right bronchus or lung: Secondary | ICD-10-CM | POA: Insufficient documentation

## 2022-11-17 DIAGNOSIS — C61 Malignant neoplasm of prostate: Secondary | ICD-10-CM | POA: Diagnosis present

## 2022-11-17 LAB — RAD ONC ARIA SESSION SUMMARY
Course Elapsed Days: 11
Plan Fractions Treated to Date: 6
Plan Prescribed Dose Per Fraction: 2.5 Gy
Plan Total Fractions Prescribed: 28
Plan Total Prescribed Dose: 70 Gy
Reference Point Dosage Given to Date: 15 Gy
Reference Point Session Dosage Given: 2.5 Gy
Session Number: 6

## 2022-11-18 ENCOUNTER — Other Ambulatory Visit: Payer: Self-pay

## 2022-11-18 ENCOUNTER — Ambulatory Visit
Admission: RE | Admit: 2022-11-18 | Discharge: 2022-11-18 | Disposition: A | Discharge status: Home / Self Care | Payer: BC Managed Care – PPO | Source: Ambulatory Visit | Attending: Radiation Oncology | Admitting: Radiation Oncology

## 2022-11-18 DIAGNOSIS — C61 Malignant neoplasm of prostate: Secondary | ICD-10-CM | POA: Diagnosis not present

## 2022-11-18 LAB — RAD ONC ARIA SESSION SUMMARY
Course Elapsed Days: 12
Plan Fractions Treated to Date: 7
Plan Prescribed Dose Per Fraction: 2.5 Gy
Plan Total Fractions Prescribed: 28
Plan Total Prescribed Dose: 70 Gy
Reference Point Dosage Given to Date: 17.5 Gy
Reference Point Session Dosage Given: 2.5 Gy
Session Number: 7

## 2022-11-19 ENCOUNTER — Other Ambulatory Visit: Payer: Self-pay | Admitting: Physician Assistant

## 2022-11-19 ENCOUNTER — Ambulatory Visit
Admission: RE | Admit: 2022-11-19 | Discharge: 2022-11-19 | Disposition: A | Discharge status: Home / Self Care | Payer: BC Managed Care – PPO | Source: Ambulatory Visit | Attending: Radiation Oncology | Admitting: Radiation Oncology

## 2022-11-19 ENCOUNTER — Other Ambulatory Visit: Payer: Self-pay

## 2022-11-19 DIAGNOSIS — C61 Malignant neoplasm of prostate: Secondary | ICD-10-CM | POA: Diagnosis not present

## 2022-11-19 DIAGNOSIS — D492 Neoplasm of unspecified behavior of bone, soft tissue, and skin: Secondary | ICD-10-CM

## 2022-11-19 LAB — RAD ONC ARIA SESSION SUMMARY
Course Elapsed Days: 13
Plan Fractions Treated to Date: 8
Plan Prescribed Dose Per Fraction: 2.5 Gy
Plan Total Fractions Prescribed: 28
Plan Total Prescribed Dose: 70 Gy
Reference Point Dosage Given to Date: 20 Gy
Reference Point Session Dosage Given: 2.5 Gy
Session Number: 8

## 2022-11-20 ENCOUNTER — Inpatient Hospital Stay: Payer: BC Managed Care – PPO | Admitting: Internal Medicine

## 2022-11-20 ENCOUNTER — Inpatient Hospital Stay: Payer: BC Managed Care – PPO

## 2022-11-20 ENCOUNTER — Other Ambulatory Visit: Payer: Self-pay | Admitting: Internal Medicine

## 2022-11-20 ENCOUNTER — Other Ambulatory Visit: Payer: Self-pay

## 2022-11-20 ENCOUNTER — Other Ambulatory Visit: Payer: Self-pay | Admitting: *Deleted

## 2022-11-20 ENCOUNTER — Ambulatory Visit
Admission: RE | Admit: 2022-11-20 | Discharge: 2022-11-20 | Disposition: A | Discharge status: Home / Self Care | Payer: BC Managed Care – PPO | Source: Ambulatory Visit | Attending: Radiation Oncology | Admitting: Radiation Oncology

## 2022-11-20 VITALS — BP 123/76 | HR 60 | Temp 98.1°F | Resp 14 | Wt 169.5 lb

## 2022-11-20 DIAGNOSIS — C61 Malignant neoplasm of prostate: Secondary | ICD-10-CM | POA: Insufficient documentation

## 2022-11-20 DIAGNOSIS — M1990 Unspecified osteoarthritis, unspecified site: Secondary | ICD-10-CM | POA: Insufficient documentation

## 2022-11-20 DIAGNOSIS — I10 Essential (primary) hypertension: Secondary | ICD-10-CM | POA: Insufficient documentation

## 2022-11-20 DIAGNOSIS — Z95828 Presence of other vascular implants and grafts: Secondary | ICD-10-CM

## 2022-11-20 DIAGNOSIS — Z5189 Encounter for other specified aftercare: Secondary | ICD-10-CM | POA: Insufficient documentation

## 2022-11-20 DIAGNOSIS — C3491 Malignant neoplasm of unspecified part of right bronchus or lung: Secondary | ICD-10-CM | POA: Insufficient documentation

## 2022-11-20 DIAGNOSIS — D492 Neoplasm of unspecified behavior of bone, soft tissue, and skin: Secondary | ICD-10-CM

## 2022-11-20 DIAGNOSIS — M791 Myalgia, unspecified site: Secondary | ICD-10-CM | POA: Insufficient documentation

## 2022-11-20 DIAGNOSIS — R918 Other nonspecific abnormal finding of lung field: Secondary | ICD-10-CM

## 2022-11-20 LAB — CMP (CANCER CENTER ONLY)
ALT: 16 U/L (ref 0–44)
AST: 20 U/L (ref 15–41)
Albumin: 3.7 g/dL (ref 3.5–5.0)
Alkaline Phosphatase: 90 U/L (ref 38–126)
Anion gap: 4 — ABNORMAL LOW (ref 5–15)
BUN: 14 mg/dL (ref 8–23)
CO2: 31 mmol/L (ref 22–32)
Calcium: 9 mg/dL (ref 8.9–10.3)
Chloride: 103 mmol/L (ref 98–111)
Creatinine: 1.17 mg/dL (ref 0.61–1.24)
GFR, Estimated: >60 mL/min (ref >60)
Glucose, Bld: 160 mg/dL — ABNORMAL HIGH (ref 70–99)
Potassium: 4 mmol/L (ref 3.5–5.1)
Sodium: 138 mmol/L (ref 135–145)
Total Bilirubin: 0.6 mg/dL (ref 0.3–1.2)
Total Protein: 6.7 g/dL (ref 6.5–8.1)

## 2022-11-20 LAB — CBC WITH DIFFERENTIAL (CANCER CENTER ONLY)
Abs Immature Granulocytes: 0.02 10*3/uL (ref 0.00–0.07)
Basophils Absolute: 0 10*3/uL (ref 0.0–0.1)
Basophils Relative: 1 %
Eosinophils Absolute: 0.3 10*3/uL (ref 0.0–0.5)
Eosinophils Relative: 7 %
HCT: 38.7 % — ABNORMAL LOW (ref 39.0–52.0)
Hemoglobin: 13.1 g/dL (ref 13.0–17.0)
Immature Granulocytes: 1 %
Lymphocytes Relative: 11 %
Lymphs Abs: 0.5 10*3/uL — ABNORMAL LOW (ref 0.7–4.0)
MCH: 33 pg (ref 26.0–34.0)
MCHC: 33.9 g/dL (ref 30.0–36.0)
MCV: 97.5 fL (ref 80.0–100.0)
Monocytes Absolute: 0.5 10*3/uL (ref 0.1–1.0)
Monocytes Relative: 11 %
Neutro Abs: 3.1 10*3/uL (ref 1.7–7.7)
Neutrophils Relative %: 69 %
Platelet Count: 166 10*3/uL (ref 150–400)
RBC: 3.97 MIL/uL — ABNORMAL LOW (ref 4.22–5.81)
RDW: 13.2 % (ref 11.5–15.5)
WBC Count: 4.3 10*3/uL (ref 4.0–10.5)
nRBC: 0 % (ref 0.0–0.2)

## 2022-11-20 LAB — RAD ONC ARIA SESSION SUMMARY
Course Elapsed Days: 14
Plan Fractions Treated to Date: 9
Plan Prescribed Dose Per Fraction: 2.5 Gy
Plan Total Fractions Prescribed: 28
Plan Total Prescribed Dose: 70 Gy
Reference Point Dosage Given to Date: 22.5 Gy
Reference Point Session Dosage Given: 2.5 Gy
Session Number: 9

## 2022-11-20 LAB — TOTAL PROTEIN, URINE DIPSTICK: Protein, ur: NEGATIVE mg/dL

## 2022-11-20 MED ORDER — HEPARIN SOD (PORK) LOCK FLUSH 100 UNIT/ML IV SOLN
500.0000 [IU] | Freq: Once | INTRAVENOUS | Status: AC | PRN
  Administered 2022-11-20: 500 [IU]

## 2022-11-20 MED ORDER — SODIUM CHLORIDE 0.9% FLUSH
10.0000 mL | Freq: Once | INTRAVENOUS | Status: AC
  Administered 2022-11-20: 10 mL

## 2022-11-20 MED ORDER — SODIUM CHLORIDE 0.9 % IV SOLN
5.0000 mg/kg | Freq: Once | INTRAVENOUS | Status: AC
  Administered 2022-11-20: 400 mg via INTRAVENOUS
  Filled 2022-11-20: qty 16

## 2022-11-20 MED ORDER — SODIUM CHLORIDE 0.9% FLUSH
10.0000 mL | INTRAVENOUS | Status: DC | PRN
  Administered 2022-11-20: 10 mL

## 2022-11-20 MED ORDER — SODIUM CHLORIDE 0.9 % IV SOLN: Freq: Once | INTRAVENOUS | Status: AC

## 2022-11-20 NOTE — Patient Instructions (Signed)
[facility]  Discharge Instructions: Thank you for choosing [facility] to provide your oncology and hematology care.   If you have a lab appointment with the Cancer Center, please go directly to the Cancer Center and check in at the registration area.   Wear comfortable clothing and clothing appropriate for easy access to any Portacath or PICC line.   We strive to give you quality time with your provider. You may need to reschedule your appointment if you arrive late (15 or more minutes).  Arriving late affects you and other patients whose appointments are after yours.  Also, if you miss three or more appointments without notifying the office, you may be dismissed from the clinic at the provider's discretion.      For prescription refill requests, have your pharmacy contact our office and allow 72 hours for refills to be completed.    Today you received the following chemotherapy and/or immunotherapy agents: MVASI      To help prevent nausea and vomiting after your treatment, we encourage you to take your nausea medication as directed.  BELOW ARE SYMPTOMS THAT SHOULD BE REPORTED IMMEDIATELY: *FEVER GREATER THAN 100.4 F (38 C) OR HIGHER *CHILLS OR SWEATING *NAUSEA AND VOMITING THAT IS NOT CONTROLLED WITH YOUR NAUSEA MEDICATION *UNUSUAL SHORTNESS OF BREATH *UNUSUAL BRUISING OR BLEEDING *URINARY PROBLEMS (pain or burning when urinating, or frequent urination) *BOWEL PROBLEMS (unusual diarrhea, constipation, pain near the anus) TENDERNESS IN MOUTH AND THROAT WITH OR WITHOUT PRESENCE OF ULCERS (sore throat, sores in mouth, or a toothache) UNUSUAL RASH, SWELLING OR PAIN  UNUSUAL VAGINAL DISCHARGE OR ITCHING   Items with * indicate a potential emergency and should be followed up as soon as possible or go to the Emergency Department if any problems should occur.  Please show the CHEMOTHERAPY ALERT CARD or IMMUNOTHERAPY ALERT CARD at check-in  to the Emergency Department and triage nurse.  Should you have questions after your visit or need to cancel or reschedule your appointment, please contact [facility]  Dept: [phone]  and follow the prompts.  Office hours are 8:00 a.m. to 4:30 p.m. Monday - Friday. Please note that voicemails left after 4:00 p.m. may not be returned until the following business day.  We are closed weekends and major holidays. You have access to a nurse at all times for urgent questions. Please call the main number to the clinic Dept: [phone] and follow the prompts.   For any non-urgent questions, you may also contact your provider using MyChart. We now offer e-Visits for anyone 18 and older to request care online for non-urgent symptoms. For details visit [email or web address].   Also download the MyChart app! Go to the app store, search "MyChart", open the app, select [facility], and log in with your MyChart username and password.   

## 2022-11-21 ENCOUNTER — Other Ambulatory Visit: Payer: Self-pay

## 2022-11-21 ENCOUNTER — Ambulatory Visit
Admission: RE | Admit: 2022-11-21 | Discharge: 2022-11-21 | Disposition: A | Discharge status: Home / Self Care | Payer: BC Managed Care – PPO | Source: Ambulatory Visit | Attending: Radiation Oncology | Admitting: Radiation Oncology

## 2022-11-21 DIAGNOSIS — C61 Malignant neoplasm of prostate: Secondary | ICD-10-CM | POA: Diagnosis not present

## 2022-11-21 LAB — RAD ONC ARIA SESSION SUMMARY
Course Elapsed Days: 15
Plan Fractions Treated to Date: 10
Plan Prescribed Dose Per Fraction: 2.5 Gy
Plan Total Fractions Prescribed: 28
Plan Total Prescribed Dose: 70 Gy
Reference Point Dosage Given to Date: 25 Gy
Reference Point Session Dosage Given: 2.5 Gy
Session Number: 10

## 2022-11-24 ENCOUNTER — Other Ambulatory Visit: Payer: Self-pay

## 2022-11-24 ENCOUNTER — Ambulatory Visit
Admission: RE | Admit: 2022-11-24 | Discharge: 2022-11-24 | Disposition: A | Discharge status: Home / Self Care | Payer: BC Managed Care – PPO | Source: Ambulatory Visit | Attending: Radiation Oncology | Admitting: Radiation Oncology

## 2022-11-24 DIAGNOSIS — C61 Malignant neoplasm of prostate: Secondary | ICD-10-CM | POA: Diagnosis not present

## 2022-11-24 LAB — RAD ONC ARIA SESSION SUMMARY
Course Elapsed Days: 18
Plan Fractions Treated to Date: 11
Plan Prescribed Dose Per Fraction: 2.5 Gy
Plan Total Fractions Prescribed: 28
Plan Total Prescribed Dose: 70 Gy
Reference Point Dosage Given to Date: 27.5 Gy
Reference Point Session Dosage Given: 2.5 Gy
Session Number: 11

## 2022-11-25 ENCOUNTER — Ambulatory Visit
Admission: RE | Admit: 2022-11-25 | Discharge: 2022-11-25 | Disposition: A | Discharge status: Home / Self Care | Payer: BC Managed Care – PPO | Source: Ambulatory Visit | Attending: Radiation Oncology | Admitting: Radiation Oncology

## 2022-11-25 ENCOUNTER — Other Ambulatory Visit: Payer: Self-pay

## 2022-11-25 DIAGNOSIS — C61 Malignant neoplasm of prostate: Secondary | ICD-10-CM | POA: Diagnosis not present

## 2022-11-25 LAB — RAD ONC ARIA SESSION SUMMARY
Course Elapsed Days: 19
Plan Fractions Treated to Date: 12
Plan Prescribed Dose Per Fraction: 2.5 Gy
Plan Total Fractions Prescribed: 28
Plan Total Prescribed Dose: 70 Gy
Reference Point Dosage Given to Date: 30 Gy
Reference Point Session Dosage Given: 2.5 Gy
Session Number: 12

## 2022-11-26 ENCOUNTER — Ambulatory Visit
Admission: RE | Admit: 2022-11-26 | Discharge: 2022-11-26 | Disposition: A | Discharge status: Home / Self Care | Payer: BC Managed Care – PPO | Source: Ambulatory Visit | Attending: Radiation Oncology | Admitting: Radiation Oncology

## 2022-11-26 ENCOUNTER — Other Ambulatory Visit: Payer: Self-pay

## 2022-11-26 DIAGNOSIS — C61 Malignant neoplasm of prostate: Secondary | ICD-10-CM | POA: Diagnosis not present

## 2022-11-26 LAB — RAD ONC ARIA SESSION SUMMARY
Course Elapsed Days: 20
Plan Fractions Treated to Date: 13
Plan Prescribed Dose Per Fraction: 2.5 Gy
Plan Total Fractions Prescribed: 28
Plan Total Prescribed Dose: 70 Gy
Reference Point Dosage Given to Date: 32.5 Gy
Reference Point Session Dosage Given: 2.5 Gy
Session Number: 13

## 2022-11-27 ENCOUNTER — Ambulatory Visit
Admission: RE | Admit: 2022-11-27 | Discharge: 2022-11-27 | Disposition: A | Discharge status: Home / Self Care | Payer: BC Managed Care – PPO | Source: Ambulatory Visit | Attending: Radiation Oncology | Admitting: Radiation Oncology

## 2022-11-27 ENCOUNTER — Other Ambulatory Visit: Payer: Self-pay

## 2022-11-27 DIAGNOSIS — C61 Malignant neoplasm of prostate: Secondary | ICD-10-CM | POA: Diagnosis not present

## 2022-11-27 LAB — RAD ONC ARIA SESSION SUMMARY
Course Elapsed Days: 21
Plan Fractions Treated to Date: 14
Plan Prescribed Dose Per Fraction: 2.5 Gy
Plan Total Fractions Prescribed: 28
Plan Total Prescribed Dose: 70 Gy
Reference Point Dosage Given to Date: 35 Gy
Reference Point Session Dosage Given: 2.5 Gy
Session Number: 14

## 2022-11-28 ENCOUNTER — Ambulatory Visit
Admission: RE | Admit: 2022-11-28 | Discharge: 2022-11-28 | Disposition: A | Discharge status: Home / Self Care | Payer: BC Managed Care – PPO | Source: Ambulatory Visit | Attending: Radiation Oncology | Admitting: Radiation Oncology

## 2022-11-28 ENCOUNTER — Other Ambulatory Visit: Payer: Self-pay

## 2022-11-28 DIAGNOSIS — C61 Malignant neoplasm of prostate: Secondary | ICD-10-CM | POA: Diagnosis not present

## 2022-11-28 LAB — RAD ONC ARIA SESSION SUMMARY
Course Elapsed Days: 22
Plan Fractions Treated to Date: 15
Plan Prescribed Dose Per Fraction: 2.5 Gy
Plan Total Fractions Prescribed: 28
Plan Total Prescribed Dose: 70 Gy
Reference Point Dosage Given to Date: 37.5 Gy
Reference Point Session Dosage Given: 2.5 Gy
Session Number: 15

## 2022-11-29 ENCOUNTER — Other Ambulatory Visit: Payer: Self-pay

## 2022-12-01 ENCOUNTER — Encounter: Payer: Self-pay | Admitting: Internal Medicine

## 2022-12-01 ENCOUNTER — Ambulatory Visit
Admission: RE | Admit: 2022-12-01 | Discharge: 2022-12-01 | Disposition: A | Discharge status: Home / Self Care | Payer: BC Managed Care – PPO | Source: Ambulatory Visit | Attending: Radiation Oncology | Admitting: Radiation Oncology

## 2022-12-01 ENCOUNTER — Other Ambulatory Visit: Payer: Self-pay

## 2022-12-01 DIAGNOSIS — C61 Malignant neoplasm of prostate: Secondary | ICD-10-CM | POA: Diagnosis not present

## 2022-12-01 LAB — RAD ONC ARIA SESSION SUMMARY
Course Elapsed Days: 25
Plan Fractions Treated to Date: 16
Plan Prescribed Dose Per Fraction: 2.5 Gy
Plan Total Fractions Prescribed: 28
Plan Total Prescribed Dose: 70 Gy
Reference Point Dosage Given to Date: 40 Gy
Reference Point Session Dosage Given: 2.5 Gy
Session Number: 16

## 2022-12-02 ENCOUNTER — Other Ambulatory Visit: Payer: Self-pay

## 2022-12-02 ENCOUNTER — Ambulatory Visit
Admission: RE | Admit: 2022-12-02 | Discharge: 2022-12-02 | Disposition: A | Discharge status: Home / Self Care | Payer: BC Managed Care – PPO | Source: Ambulatory Visit | Attending: Radiation Oncology | Admitting: Radiation Oncology

## 2022-12-02 DIAGNOSIS — C61 Malignant neoplasm of prostate: Secondary | ICD-10-CM | POA: Diagnosis not present

## 2022-12-02 LAB — RAD ONC ARIA SESSION SUMMARY
Course Elapsed Days: 26
Plan Fractions Treated to Date: 17
Plan Prescribed Dose Per Fraction: 2.5 Gy
Plan Total Fractions Prescribed: 28
Plan Total Prescribed Dose: 70 Gy
Reference Point Dosage Given to Date: 42.5 Gy
Reference Point Session Dosage Given: 2.5 Gy
Session Number: 17

## 2022-12-02 NOTE — Progress Notes (Signed)
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  PRIOR THERAPY: 1) status post resection and the patient has recurrence and October 2021 status post resection again under the care of Dr. Dorris Fetch. 2) Sunitinib 3 7.5 mg p.o. daily.  First dose started April 08, 2021.  Status post 3 months of treatment.  This treatment was discontinued secondary to disease progression. 3) Votrient (pazopanib) 800 mg p.o. daily.  Started August 10, 2021.  Status post 2 months of treatment discontinued secondary to disease progression. 4) status post redo right thoracotomy with resection of pleural tumors and lymph node dissection under the care of Dr. Dorris Fetch on Nov 13, 2021. 5) Systemic chemotherapy with doxorubicin 25 Mg/M2, ifosfamide 2500 Mg/M2 and mesna 2500 Mg/M2 days 1-3 every 3 weeks.  First dose February 12, 2022 at [facility] cancer center.  Status post 2 cycles of treatment discontinued secondary to disease progression. 6) Radiation to the prostate cancer, under the care of Dr. Kathrynn Running. Last day of radiation on 12/17/22  CURRENT THERAPY: Systemic chemotherapy with Temodar 200 Mg/M2 on days 1-7 and day 15-21 with a Avastin 5 Mg/KG on days 8 and 22 every 4 weeks.  Status post 7 cycles    INTERVAL HISTORY: KEAGHAN KOVALENKO 62 y.o. male returns to the clinic today for a follow-up visit. In summary, the patient is currently undergoing treatment for his fibrous tumor of the lung by our clinic with Avastin IV every 2 weeks and Temodar bimonthly.  He is overall tolerating this well. He did notice about 3 weeks ago he had some more multifocal joint stiffness in his hips, thumbs, elbows, hands, etc. However, the stiffness  improved.   The patient also was recently diagnosed with prostate cancer after he had a biopsy in January 2024. He is undergoing radiation under the care of Dr. Kathrynn Running. The last day of radiation is scheduled for 12/17/22. He has some burning with urination as he was told he may experience with radiation and some increase in his loose bowel movements but overall it is manageable. He does feel like he needs to take imodium. He reports up to 3 loose bowel movements.   Otherwise the patient is feeling well today.  Denies any blood in the stool.  He denies any fever, chills, or night sweats.  Overall, his breathing is at his baseline. Denies any abnormal bleeding or bruising. Denies any headaches. He does feel like his vision is slightly more blurry but he reports it has been awhile since he had his eyes checked. Denies any nausea or vomiting. He also follows with Dr. Waymon Amato from [facility]. He has a follow up in about 3-4 weeks with a repeat CT scan. He is here today for evaluation repeat blood work before undergoing day 8 cycle #8.       MEDICAL HISTORY: Past Medical History:  Diagnosis Date   Anemia    Clubbing of nails    Deafness in left ear    Encounter for antineoplastic chemotherapy    History of kidney stones    Hypertension    Hypoglycemia due to neoplasm    Malignant neoplasm prostate [facility]) 07/2022   urologist--- dr pace;   bx 01/ 2024, Gleason 4+3  Malignant solitary fibrous neoplasm (HCC) 11/2015   oncologist--- @ [facility] Dr R. 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  MEDICATIONS:  Current Outpatient Medications  Medication Sig Dispense Refill   amLODipine (NORVASC) 10 MG tablet Take 10 mg by mouth daily. (Patient not taking: Reported on 11/06/2022)     bevacizumab-awwb (MVASI) 400 MG/16ML SOLN Inject 5 mg/kg into the vein once.     temozolomide (TEMODAR) 100 MG capsule Take 100 mg by mouth at bedtime.     No current facility-administered medications for this visit.   Facility-Administered Medications Ordered in Other Visits  Medication Dose Route Frequency Provider Last Rate Last Admin   sodium phosphate (FLEET) 7-19 GM/118ML enema 1 enema  1 enema Rectal Once Noel Christmas, MD       sodium phosphate (FLEET) 7-19 GM/118ML enema 1 enema  1 enema Rectal Once Noel Christmas, MD        SURGICAL HISTORY:  Past Surgical History:  Procedure Laterality Date   GOLD SEED IMPLANT N/A 10/21/2022   Procedure: GOLD SEED IMPLANT;  Surgeon: Noel Christmas, MD;  Location: [facility];  Service: Urology;  Laterality: N/A;   INTERCOSTAL NERVE BLOCK Right 04/27/2020   Procedure: INTERCOSTAL NERVE BLOCK;  Surgeon: Loreli Slot, MD;  Location: [facility] OR;  Service: Thoracic;  Laterality: Right;   KNEE CARTILAGE SURGERY Left 1976   RESECTION OF MEDIASTINAL MASS Right 01/23/2016   @[facility]  by dr Dorris Fetch;   resection chest wall mass w/ en bloc wedge resection of RLL   RESECTION OF MEDIASTINAL MASS Right 11/13/2021   Procedure: RESECTION OF PLEURAL TUMORS;  Surgeon: Loreli Slot, MD;  Location: [facility] OR;  Service: Thoracic;  Laterality: Right;   SPACE OAR INSTILLATION N/A 10/21/2022   Procedure: SPACE OAR INSTILLATION;  Surgeon: Noel Christmas, MD;  Location: [facility];  Service: Urology;  Laterality: N/A;   THORACOTOMY Right 04/27/2020   Procedure: THORACOTOMY;  Surgeon: Loreli Slot, MD;  Location: [facility] OR;  Service: Thoracic;  Laterality: Right;    THORACOTOMY Right 11/13/2021   Procedure: REDO THORACOTOMY;  Surgeon: Loreli Slot, MD;  Location: [facility] OR;  Service: Thoracic;  Laterality: Right;   TONSILLECTOMY     child   VIDEO BRONCHOSCOPY WITH ENDOBRONCHIAL ULTRASOUND N/A 12/17/2015   Procedure: VIDEO BRONCHOSCOPY WITH ENDOBRONCHIAL ULTRASOUND;  Surgeon: Loreli Slot, MD;  Location: [facility] OR;  Service: Thoracic;  Laterality: N/A;    REVIEW OF SYSTEMS:   Constitutional: Negative for appetite change, chills, fatigue, fever and unexpected weight change.  HENT: Negative for mouth sores, nosebleeds, sore throat and trouble swallowing.   Eyes: Negative for eye problems and icterus.  Respiratory: Negative for cough, hemoptysis, shortness of breath and wheezing.   Cardiovascular: Negative for chest pain and leg swelling.  Gastrointestinal: Positive for loose stool. Negative for abdominal pain, constipation, nausea and vomiting.  Genitourinary: Positive for some  burning with urination. Negative for bladder incontinence, difficulty urinating, frequency and hematuria.   Musculoskeletal: Negative for back pain, gait problem, neck pain and neck stiffness.  Skin: Negative for itching and rash.  Neurological: Negative for dizziness, extremity weakness, gait problem, headaches, light-headedness and seizures.  Hematological: Negative for adenopathy. Does not bruise/bleed easily.  Psychiatric/Behavioral: Negative for confusion, depression and sleep disturbance. The patient is not nervous/anxious.       PHYSICAL EXAMINATION:  Blood pressure (!) 140/76, pulse (!) 59, temperature 97.9 F (36.6 C), temperature source Temporal, resp. rate 17, weight 165 lb 12.8 oz (75.2 kg), SpO2 100 %.  ECOG PERFORMANCE STATUS: 1  Physical Exam  Constitutional: Oriented to person, place, and time and well-developed, well-nourished, and in no distress.  HENT:  Head: Normocephalic and atraumatic.  Mouth/Throat: Oropharynx is clear and moist. No  oropharyngeal exudate.  Eyes: Conjunctivae are normal. Right eye exhibits no discharge. Left eye exhibits no discharge. No scleral icterus.  Neck: Normal range of motion. Neck supple.  Cardiovascular: Normal rate, regular rhythm, normal heart sounds and intact distal pulses.   Pulmonary/Chest: Effort normal.  Quiet breath sounds in the right lung.  No respiratory distress. No wheezes. No rales.  Abdominal: Soft. Bowel sounds are normal. Exhibits no distension and no mass. There is no tenderness. He had mild soreness over his umbilicus itself. He lifts weights and it may be musculoskeletal.  Musculoskeletal: Normal range of motion. Lymphadenopathy:    No cervical adenopathy.  Neurological: Alert and oriented to person, place, and time. Exhibits normal muscle tone. Gait normal. Coordination normal.  Skin: Dry skin/peeling on right heel. Skin is warm and dry. No rash noted. Not diaphoretic. No erythema. No pallor.  Psychiatric: Mood, memory and judgment normal.  Vitals reviewed.  LABORATORY DATA: Lab Results  Component Value Date   WBC 4.2 12/04/2022   HGB 13.4 12/04/2022   HCT 41.8 12/04/2022   MCV 99.1 12/04/2022   PLT 153 12/04/2022      Chemistry      Component Value Date/Time   NA 138 11/20/2022 1302   K 4.0 11/20/2022 1302   CL 103 11/20/2022 1302   CO2 31 11/20/2022 1302   BUN 14 11/20/2022 1302   CREATININE 1.17 11/20/2022 1302      Component Value Date/Time   CALCIUM 9.0 11/20/2022 1302   ALKPHOS 90 11/20/2022 1302   AST 20 11/20/2022 1302   ALT 16 11/20/2022 1302   BILITOT 0.6 11/20/2022 1302       RADIOGRAPHIC STUDIES:  No results found.   ASSESSMENT/PLAN:  This is a very pleasant 62 year old male with  1) recurrent solitary fibrous tumor of the right lung.  This was initially diagnosed in June 2017.  2) prostate adenocarcinoma with Gleason score of 7 (4+3) diagnosed in February 2024 followed by urology.    He is status post resection.  He had recurrence  in October 2021 and underwent re-resection under the care of Dr. Dorris Fetch.  He had been on observation as there is no role for adjuvant therapy for his condition.  He was seen by Dr. Lin Givens at [facility] who recommended observation and consideration of treatment with Sunitinib followed by surgery if the patient had disease recurrence.    The patient had a new 1.4 cm nodule posteriorly and the patient underwent treatment with sunitinib 37.5 mg p.o. daily.  He was on this for 3 months. He had been tolerating this fairly well except for hypertension.  Unfortunately scan showed evidence  of disease progression with interval development of multiple new pleural-based nodules in the right hemithorax measuring up to 2.9 cm which is concerning for recurrent/metastatic disease.    He was then seen by Dr. Dorris Fetch who offered surgical resection.  Dr. Lin Givens recommended neoadjuvant treatment with pazopanib for 8 weeks followed by surgical resection if he had improvement in his disease followed by continued adjuvant treatment for another 6 to 8 months.  Unfortunately, after 2 months of treatment the CT scan showed clear evidence of disease progression with enlarging right pleural-based masses.  He was referred to Dr. 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  Molecular studies by foundation 1 and PD-L1 did not show any actionable mutations and his PD-L1 expression was negative.    The patient then saw Dr. Waymon Amato at [facility] who recommended inpatient systemic chemotherapy with doxorubicin, ifosfamide, and mesna IV every 3 weeks.  He had a 2D echo as well as a Port-A-Cath placed.  This was discontinued due to evidence of disease progression.    Dr. Waymon Amato  recommended for the patient treatment with Temodar 200 Mg/M2 on days 1-7 and 15-21 in addition to Avastin 5 Mg/M2 on days 8 and 22 every 4 weeks.  He is here today for day 8 cycle 8.    He is scheduled to follow up with [facility] in about 3-4 weeks per patient report with CT scan.     Labs were reviewed.  Recommend that he proceed with cycle number 8-day 8 today as scheduled.  He is undergoing radiation to the prostate cancer under the care of Dr. Kathrynn Running. His last day of radiation is scheduled for 12/17/22.   Discussed using Imodium if needed for diarrhea. He does not feel like he needs this at this time.    The patient reports some myalgias and arthralgias after his last appointment.  I reviewed with the patient that Avastin can cause some joint pain and Tylenol would be preferred if needed.  His joint pain has mostly resolved at this time.   We will see him back in 2 weeks for evaluation repeat blood work before starting day 22 cycle 6.     He has about shockwave therapy to his knee.  Discussed that the only contraindication from our standpoint would be if there were some type of incision/delayed wound healing/infection rest.      No orders of the defined types were placed in this encounter.    The total time spent in the appointment was 20-29 minutes  Seraya Jobst L Tali Cleaves, PA-C 12/04/22

## 2022-12-03 ENCOUNTER — Other Ambulatory Visit: Payer: Self-pay | Admitting: Physician Assistant

## 2022-12-03 ENCOUNTER — Other Ambulatory Visit: Payer: Self-pay

## 2022-12-03 ENCOUNTER — Ambulatory Visit
Admission: RE | Admit: 2022-12-03 | Discharge: 2022-12-03 | Disposition: A | Discharge status: Home / Self Care | Payer: BC Managed Care – PPO | Source: Ambulatory Visit | Attending: Radiation Oncology | Admitting: Radiation Oncology

## 2022-12-03 DIAGNOSIS — C61 Malignant neoplasm of prostate: Secondary | ICD-10-CM | POA: Diagnosis not present

## 2022-12-03 LAB — RAD ONC ARIA SESSION SUMMARY
Course Elapsed Days: 27
Plan Fractions Treated to Date: 18
Plan Prescribed Dose Per Fraction: 2.5 Gy
Plan Total Fractions Prescribed: 28
Plan Total Prescribed Dose: 70 Gy
Reference Point Dosage Given to Date: 45 Gy
Reference Point Session Dosage Given: 2.5 Gy
Session Number: 18

## 2022-12-04 ENCOUNTER — Other Ambulatory Visit: Payer: Self-pay

## 2022-12-04 ENCOUNTER — Other Ambulatory Visit: Payer: BC Managed Care – PPO

## 2022-12-04 ENCOUNTER — Inpatient Hospital Stay: Payer: BC Managed Care – PPO

## 2022-12-04 ENCOUNTER — Ambulatory Visit
Admission: RE | Admit: 2022-12-04 | Discharge: 2022-12-04 | Disposition: A | Discharge status: Home / Self Care | Payer: BC Managed Care – PPO | Source: Ambulatory Visit | Attending: Radiation Oncology | Admitting: Radiation Oncology

## 2022-12-04 ENCOUNTER — Inpatient Hospital Stay: Payer: BC Managed Care – PPO | Admitting: Physician Assistant

## 2022-12-04 VITALS — BP 140/76 | HR 59 | Temp 97.9°F | Resp 17 | Wt 165.8 lb

## 2022-12-04 VITALS — BP 132/72 | HR 60 | Temp 98.2°F | Resp 18

## 2022-12-04 DIAGNOSIS — D492 Neoplasm of unspecified behavior of bone, soft tissue, and skin: Secondary | ICD-10-CM

## 2022-12-04 DIAGNOSIS — C61 Malignant neoplasm of prostate: Secondary | ICD-10-CM | POA: Diagnosis not present

## 2022-12-04 DIAGNOSIS — Z95828 Presence of other vascular implants and grafts: Secondary | ICD-10-CM

## 2022-12-04 DIAGNOSIS — R918 Other nonspecific abnormal finding of lung field: Secondary | ICD-10-CM

## 2022-12-04 LAB — TOTAL PROTEIN, URINE DIPSTICK: Protein, ur: NEGATIVE mg/dL

## 2022-12-04 LAB — CBC WITH DIFFERENTIAL (CANCER CENTER ONLY)
Abs Immature Granulocytes: 0.01 10*3/uL (ref 0.00–0.07)
Basophils Absolute: 0 10*3/uL (ref 0.0–0.1)
Basophils Relative: 1 %
Eosinophils Absolute: 0.3 10*3/uL (ref 0.0–0.5)
Eosinophils Relative: 7 %
HCT: 41.8 % (ref 39.0–52.0)
Hemoglobin: 13.4 g/dL (ref 13.0–17.0)
Immature Granulocytes: 0 %
Lymphocytes Relative: 9 %
Lymphs Abs: 0.4 10*3/uL — ABNORMAL LOW (ref 0.7–4.0)
MCH: 31.8 pg (ref 26.0–34.0)
MCHC: 32.1 g/dL (ref 30.0–36.0)
MCV: 99.1 fL (ref 80.0–100.0)
Monocytes Absolute: 0.5 10*3/uL (ref 0.1–1.0)
Monocytes Relative: 12 %
Neutro Abs: 3 10*3/uL (ref 1.7–7.7)
Neutrophils Relative %: 71 %
Platelet Count: 153 10*3/uL (ref 150–400)
RBC: 4.22 MIL/uL (ref 4.22–5.81)
RDW: 13.1 % (ref 11.5–15.5)
WBC Count: 4.2 10*3/uL (ref 4.0–10.5)
nRBC: 0 % (ref 0.0–0.2)

## 2022-12-04 LAB — CMP (CANCER CENTER ONLY)
ALT: 13 U/L (ref 0–44)
AST: 19 U/L (ref 15–41)
Albumin: 3.5 g/dL (ref 3.5–5.0)
Alkaline Phosphatase: 88 U/L (ref 38–126)
Anion gap: 5 (ref 5–15)
BUN: 12 mg/dL (ref 8–23)
CO2: 30 mmol/L (ref 22–32)
Calcium: 9.4 mg/dL (ref 8.9–10.3)
Chloride: 106 mmol/L (ref 98–111)
Creatinine: 1.19 mg/dL (ref 0.61–1.24)
GFR, Estimated: >60 mL/min (ref >60)
Glucose, Bld: 126 mg/dL — ABNORMAL HIGH (ref 70–99)
Potassium: 3.5 mmol/L (ref 3.5–5.1)
Sodium: 141 mmol/L (ref 135–145)
Total Bilirubin: 0.6 mg/dL (ref 0.3–1.2)
Total Protein: 6.5 g/dL (ref 6.5–8.1)

## 2022-12-04 LAB — RAD ONC ARIA SESSION SUMMARY
Course Elapsed Days: 28
Plan Fractions Treated to Date: 19
Plan Prescribed Dose Per Fraction: 2.5 Gy
Plan Total Fractions Prescribed: 28
Plan Total Prescribed Dose: 70 Gy
Reference Point Dosage Given to Date: 47.5 Gy
Reference Point Session Dosage Given: 2.5 Gy
Session Number: 19

## 2022-12-04 MED ORDER — SODIUM CHLORIDE 0.9 % IV SOLN: Freq: Once | INTRAVENOUS | Status: AC

## 2022-12-04 MED ORDER — SODIUM CHLORIDE 0.9% FLUSH
10.0000 mL | Freq: Once | INTRAVENOUS | Status: AC
  Administered 2022-12-04: 10 mL

## 2022-12-04 MED ORDER — SODIUM CHLORIDE 0.9 % IV SOLN
5.0000 mg/kg | Freq: Once | INTRAVENOUS | Status: AC
  Administered 2022-12-04: 400 mg via INTRAVENOUS
  Filled 2022-12-04: qty 16

## 2022-12-04 NOTE — Progress Notes (Signed)
Patient seen by [facility], PA-C  Vitals are within treatment parameters.  Labs reviewed: and are within treatment parameters. CMP pending  Per physician team, patient is ready for treatment and there are NO modifications to the treatment plan.

## 2022-12-04 NOTE — Patient Instructions (Signed)
[facility]  Discharge Instructions: Thank you for choosing [facility] to provide your oncology and hematology care.   If you have a lab appointment with the Cancer Center, please go directly to the Cancer Center and check in at the registration area.   Wear comfortable clothing and clothing appropriate for easy access to any Portacath or PICC line.   We strive to give you quality time with your provider. You may need to reschedule your appointment if you arrive late (15 or more minutes).  Arriving late affects you and other patients whose appointments are after yours.  Also, if you miss three or more appointments without notifying the office, you may be dismissed from the clinic at the provider's discretion.      For prescription refill requests, have your pharmacy contact our office and allow 72 hours for refills to be completed.    Today you received the following chemotherapy and/or immunotherapy agents: MVASI      To help prevent nausea and vomiting after your treatment, we encourage you to take your nausea medication as directed.  BELOW ARE SYMPTOMS THAT SHOULD BE REPORTED IMMEDIATELY: *FEVER GREATER THAN 100.4 F (38 C) OR HIGHER *CHILLS OR SWEATING *NAUSEA AND VOMITING THAT IS NOT CONTROLLED WITH YOUR NAUSEA MEDICATION *UNUSUAL SHORTNESS OF BREATH *UNUSUAL BRUISING OR BLEEDING *URINARY PROBLEMS (pain or burning when urinating, or frequent urination) *BOWEL PROBLEMS (unusual diarrhea, constipation, pain near the anus) TENDERNESS IN MOUTH AND THROAT WITH OR WITHOUT PRESENCE OF ULCERS (sore throat, sores in mouth, or a toothache) UNUSUAL RASH, SWELLING OR PAIN  UNUSUAL VAGINAL DISCHARGE OR ITCHING   Items with * indicate a potential emergency and should be followed up as soon as possible or go to the Emergency Department if any problems should occur.  Please show the CHEMOTHERAPY ALERT CARD or IMMUNOTHERAPY ALERT CARD at check-in  to the Emergency Department and triage nurse.  Should you have questions after your visit or need to cancel or reschedule your appointment, please contact [facility]  Dept: [phone]  and follow the prompts.  Office hours are 8:00 a.m. to 4:30 p.m. Monday - Friday. Please note that voicemails left after 4:00 p.m. may not be returned until the following business day.  We are closed weekends and major holidays. You have access to a nurse at all times for urgent questions. Please call the main number to the clinic Dept: [phone] and follow the prompts.   For any non-urgent questions, you may also contact your provider using MyChart. We now offer e-Visits for anyone 18 and older to request care online for non-urgent symptoms. For details visit [email or web address].   Also download the MyChart app! Go to the app store, search "MyChart", open the app, select [facility], and log in with your MyChart username and password.   

## 2022-12-05 ENCOUNTER — Other Ambulatory Visit: Payer: Self-pay

## 2022-12-05 ENCOUNTER — Telehealth: Payer: Self-pay | Admitting: Internal Medicine

## 2022-12-05 ENCOUNTER — Ambulatory Visit
Admission: RE | Admit: 2022-12-05 | Discharge: 2022-12-05 | Disposition: A | Discharge status: Home / Self Care | Payer: BC Managed Care – PPO | Source: Ambulatory Visit | Attending: Radiation Oncology | Admitting: Radiation Oncology

## 2022-12-05 DIAGNOSIS — C61 Malignant neoplasm of prostate: Secondary | ICD-10-CM | POA: Diagnosis not present

## 2022-12-05 LAB — RAD ONC ARIA SESSION SUMMARY
Course Elapsed Days: 29
Plan Fractions Treated to Date: 20
Plan Prescribed Dose Per Fraction: 2.5 Gy
Plan Total Fractions Prescribed: 28
Plan Total Prescribed Dose: 70 Gy
Reference Point Dosage Given to Date: 50 Gy
Reference Point Session Dosage Given: 2.5 Gy
Session Number: 20

## 2022-12-05 NOTE — Telephone Encounter (Signed)
Scheduled per 06/19 work-queue and los., patient has been called and notified of upcoming appointments.

## 2022-12-08 ENCOUNTER — Other Ambulatory Visit: Payer: Self-pay

## 2022-12-08 ENCOUNTER — Ambulatory Visit
Admission: RE | Admit: 2022-12-08 | Discharge: 2022-12-08 | Disposition: A | Discharge status: Home / Self Care | Payer: BC Managed Care – PPO | Source: Ambulatory Visit | Attending: Radiation Oncology | Admitting: Radiation Oncology

## 2022-12-08 DIAGNOSIS — C61 Malignant neoplasm of prostate: Secondary | ICD-10-CM | POA: Diagnosis not present

## 2022-12-08 LAB — RAD ONC ARIA SESSION SUMMARY
Course Elapsed Days: 32
Plan Fractions Treated to Date: 21
Plan Prescribed Dose Per Fraction: 2.5 Gy
Plan Total Fractions Prescribed: 28
Plan Total Prescribed Dose: 70 Gy
Reference Point Dosage Given to Date: 52.5 Gy
Reference Point Session Dosage Given: 2.5 Gy
Session Number: 21

## 2022-12-09 ENCOUNTER — Ambulatory Visit
Admission: RE | Admit: 2022-12-09 | Discharge: 2022-12-09 | Disposition: A | Discharge status: Home / Self Care | Payer: BC Managed Care – PPO | Source: Ambulatory Visit | Attending: Radiation Oncology | Admitting: Radiation Oncology

## 2022-12-09 ENCOUNTER — Other Ambulatory Visit: Payer: Self-pay

## 2022-12-09 DIAGNOSIS — C61 Malignant neoplasm of prostate: Secondary | ICD-10-CM | POA: Diagnosis not present

## 2022-12-09 LAB — RAD ONC ARIA SESSION SUMMARY
Course Elapsed Days: 33
Plan Fractions Treated to Date: 22
Plan Prescribed Dose Per Fraction: 2.5 Gy
Plan Total Fractions Prescribed: 28
Plan Total Prescribed Dose: 70 Gy
Reference Point Dosage Given to Date: 55 Gy
Reference Point Session Dosage Given: 2.5 Gy
Session Number: 22

## 2022-12-10 ENCOUNTER — Ambulatory Visit
Admission: RE | Admit: 2022-12-10 | Discharge: 2022-12-10 | Disposition: A | Discharge status: Home / Self Care | Payer: BC Managed Care – PPO | Source: Ambulatory Visit | Attending: Radiation Oncology | Admitting: Radiation Oncology

## 2022-12-10 ENCOUNTER — Other Ambulatory Visit: Payer: Self-pay

## 2022-12-10 DIAGNOSIS — C61 Malignant neoplasm of prostate: Secondary | ICD-10-CM | POA: Diagnosis not present

## 2022-12-10 LAB — RAD ONC ARIA SESSION SUMMARY
Course Elapsed Days: 34
Plan Fractions Treated to Date: 23
Plan Prescribed Dose Per Fraction: 2.5 Gy
Plan Total Fractions Prescribed: 28
Plan Total Prescribed Dose: 70 Gy
Reference Point Dosage Given to Date: 57.5 Gy
Reference Point Session Dosage Given: 2.5 Gy
Session Number: 23

## 2022-12-11 ENCOUNTER — Other Ambulatory Visit: Payer: Self-pay

## 2022-12-11 ENCOUNTER — Ambulatory Visit
Admission: RE | Admit: 2022-12-11 | Discharge: 2022-12-11 | Disposition: A | Discharge status: Home / Self Care | Payer: BC Managed Care – PPO | Source: Ambulatory Visit | Attending: Radiation Oncology | Admitting: Radiation Oncology

## 2022-12-11 DIAGNOSIS — C61 Malignant neoplasm of prostate: Secondary | ICD-10-CM | POA: Diagnosis not present

## 2022-12-11 LAB — RAD ONC ARIA SESSION SUMMARY
Course Elapsed Days: 35
Plan Fractions Treated to Date: 24
Plan Prescribed Dose Per Fraction: 2.5 Gy
Plan Total Fractions Prescribed: 28
Plan Total Prescribed Dose: 70 Gy
Reference Point Dosage Given to Date: 60 Gy
Reference Point Session Dosage Given: 2.5 Gy
Session Number: 24

## 2022-12-12 ENCOUNTER — Ambulatory Visit
Admission: RE | Admit: 2022-12-12 | Discharge: 2022-12-12 | Disposition: A | Discharge status: Home / Self Care | Payer: BC Managed Care – PPO | Source: Ambulatory Visit | Attending: Radiation Oncology | Admitting: Radiation Oncology

## 2022-12-12 ENCOUNTER — Other Ambulatory Visit: Payer: Self-pay

## 2022-12-12 DIAGNOSIS — C61 Malignant neoplasm of prostate: Secondary | ICD-10-CM | POA: Diagnosis not present

## 2022-12-12 LAB — RAD ONC ARIA SESSION SUMMARY
Course Elapsed Days: 36
Plan Fractions Treated to Date: 25
Plan Prescribed Dose Per Fraction: 2.5 Gy
Plan Total Fractions Prescribed: 28
Plan Total Prescribed Dose: 70 Gy
Reference Point Dosage Given to Date: 62.5 Gy
Reference Point Session Dosage Given: 2.5 Gy
Session Number: 25

## 2022-12-14 ENCOUNTER — Other Ambulatory Visit: Payer: Self-pay

## 2022-12-15 ENCOUNTER — Other Ambulatory Visit: Payer: Self-pay

## 2022-12-15 ENCOUNTER — Ambulatory Visit
Admission: RE | Admit: 2022-12-15 | Discharge: 2022-12-15 | Disposition: A | Discharge status: Home / Self Care | Payer: BC Managed Care – PPO | Source: Ambulatory Visit | Attending: Radiation Oncology | Admitting: Radiation Oncology

## 2022-12-15 DIAGNOSIS — Z5112 Encounter for antineoplastic immunotherapy: Secondary | ICD-10-CM | POA: Insufficient documentation

## 2022-12-15 DIAGNOSIS — M1990 Unspecified osteoarthritis, unspecified site: Secondary | ICD-10-CM | POA: Insufficient documentation

## 2022-12-15 DIAGNOSIS — I10 Essential (primary) hypertension: Secondary | ICD-10-CM | POA: Diagnosis not present

## 2022-12-15 DIAGNOSIS — Z510 Encounter for antineoplastic radiation therapy: Secondary | ICD-10-CM | POA: Diagnosis not present

## 2022-12-15 DIAGNOSIS — Z5189 Encounter for other specified aftercare: Secondary | ICD-10-CM | POA: Diagnosis not present

## 2022-12-15 DIAGNOSIS — C3491 Malignant neoplasm of unspecified part of right bronchus or lung: Secondary | ICD-10-CM | POA: Diagnosis not present

## 2022-12-15 DIAGNOSIS — C61 Malignant neoplasm of prostate: Secondary | ICD-10-CM | POA: Diagnosis not present

## 2022-12-15 DIAGNOSIS — M791 Myalgia, unspecified site: Secondary | ICD-10-CM | POA: Diagnosis not present

## 2022-12-15 LAB — RAD ONC ARIA SESSION SUMMARY
Course Elapsed Days: 39
Plan Fractions Treated to Date: 26
Plan Prescribed Dose Per Fraction: 2.5 Gy
Plan Total Fractions Prescribed: 28
Plan Total Prescribed Dose: 70 Gy
Reference Point Dosage Given to Date: 65 Gy
Reference Point Session Dosage Given: 2.5 Gy
Session Number: 26

## 2022-12-16 ENCOUNTER — Ambulatory Visit: Payer: BC Managed Care – PPO

## 2022-12-16 ENCOUNTER — Other Ambulatory Visit: Payer: Self-pay

## 2022-12-16 ENCOUNTER — Ambulatory Visit
Admission: RE | Admit: 2022-12-16 | Discharge: 2022-12-16 | Disposition: A | Discharge status: Home / Self Care | Payer: BC Managed Care – PPO | Source: Ambulatory Visit | Attending: Radiation Oncology | Admitting: Radiation Oncology

## 2022-12-16 DIAGNOSIS — C61 Malignant neoplasm of prostate: Secondary | ICD-10-CM | POA: Diagnosis not present

## 2022-12-16 LAB — RAD ONC ARIA SESSION SUMMARY
Course Elapsed Days: 40
Plan Fractions Treated to Date: 27
Plan Prescribed Dose Per Fraction: 2.5 Gy
Plan Total Fractions Prescribed: 28
Plan Total Prescribed Dose: 70 Gy
Reference Point Dosage Given to Date: 67.5 Gy
Reference Point Session Dosage Given: 2.5 Gy
Session Number: 27

## 2022-12-17 ENCOUNTER — Ambulatory Visit: Payer: BC Managed Care – PPO

## 2022-12-17 ENCOUNTER — Other Ambulatory Visit: Payer: Self-pay

## 2022-12-17 ENCOUNTER — Inpatient Hospital Stay: Payer: BC Managed Care – PPO

## 2022-12-17 ENCOUNTER — Ambulatory Visit
Admission: RE | Admit: 2022-12-17 | Discharge: 2022-12-17 | Disposition: A | Discharge status: Home / Self Care | Payer: BC Managed Care – PPO | Source: Ambulatory Visit | Attending: Radiation Oncology | Admitting: Radiation Oncology

## 2022-12-17 ENCOUNTER — Inpatient Hospital Stay: Payer: BC Managed Care – PPO | Attending: Internal Medicine | Admitting: Internal Medicine

## 2022-12-17 VITALS — BP 120/81 | HR 64

## 2022-12-17 DIAGNOSIS — Z5189 Encounter for other specified aftercare: Secondary | ICD-10-CM | POA: Insufficient documentation

## 2022-12-17 DIAGNOSIS — H9192 Unspecified hearing loss, left ear: Secondary | ICD-10-CM | POA: Insufficient documentation

## 2022-12-17 DIAGNOSIS — Z79630 Long term (current) use of alkylating agent: Secondary | ICD-10-CM | POA: Insufficient documentation

## 2022-12-17 DIAGNOSIS — C61 Malignant neoplasm of prostate: Secondary | ICD-10-CM | POA: Diagnosis not present

## 2022-12-17 DIAGNOSIS — R5383 Other fatigue: Secondary | ICD-10-CM | POA: Insufficient documentation

## 2022-12-17 DIAGNOSIS — C782 Secondary malignant neoplasm of pleura: Secondary | ICD-10-CM | POA: Insufficient documentation

## 2022-12-17 DIAGNOSIS — E559 Vitamin D deficiency, unspecified: Secondary | ICD-10-CM | POA: Insufficient documentation

## 2022-12-17 DIAGNOSIS — D492 Neoplasm of unspecified behavior of bone, soft tissue, and skin: Secondary | ICD-10-CM

## 2022-12-17 DIAGNOSIS — M1990 Unspecified osteoarthritis, unspecified site: Secondary | ICD-10-CM | POA: Insufficient documentation

## 2022-12-17 DIAGNOSIS — D61818 Other pancytopenia: Secondary | ICD-10-CM | POA: Insufficient documentation

## 2022-12-17 DIAGNOSIS — D649 Anemia, unspecified: Secondary | ICD-10-CM | POA: Insufficient documentation

## 2022-12-17 DIAGNOSIS — R21 Rash and other nonspecific skin eruption: Secondary | ICD-10-CM | POA: Insufficient documentation

## 2022-12-17 DIAGNOSIS — E161 Other hypoglycemia: Secondary | ICD-10-CM | POA: Insufficient documentation

## 2022-12-17 DIAGNOSIS — Z87442 Personal history of urinary calculi: Secondary | ICD-10-CM | POA: Insufficient documentation

## 2022-12-17 DIAGNOSIS — R112 Nausea with vomiting, unspecified: Secondary | ICD-10-CM | POA: Insufficient documentation

## 2022-12-17 DIAGNOSIS — I10 Essential (primary) hypertension: Secondary | ICD-10-CM | POA: Insufficient documentation

## 2022-12-17 DIAGNOSIS — Z79899 Other long term (current) drug therapy: Secondary | ICD-10-CM | POA: Insufficient documentation

## 2022-12-17 LAB — CBC WITH DIFFERENTIAL (CANCER CENTER ONLY)
Abs Immature Granulocytes: 0.01 10*3/uL (ref 0.00–0.07)
Basophils Absolute: 0 10*3/uL (ref 0.0–0.1)
Basophils Relative: 0 %
Eosinophils Absolute: 0.5 10*3/uL (ref 0.0–0.5)
Eosinophils Relative: 11 %
HCT: 42.7 % (ref 39.0–52.0)
Hemoglobin: 14 g/dL (ref 13.0–17.0)
Immature Granulocytes: 0 %
Lymphocytes Relative: 6 %
Lymphs Abs: 0.3 10*3/uL — ABNORMAL LOW (ref 0.7–4.0)
MCH: 32 pg (ref 26.0–34.0)
MCHC: 32.8 g/dL (ref 30.0–36.0)
MCV: 97.7 fL (ref 80.0–100.0)
Monocytes Absolute: 0.6 10*3/uL (ref 0.1–1.0)
Monocytes Relative: 12 %
Neutro Abs: 3.5 10*3/uL (ref 1.7–7.7)
Neutrophils Relative %: 71 %
Platelet Count: 175 10*3/uL (ref 150–400)
RBC: 4.37 MIL/uL (ref 4.22–5.81)
RDW: 13 % (ref 11.5–15.5)
WBC Count: 4.9 10*3/uL (ref 4.0–10.5)
nRBC: 0 % (ref 0.0–0.2)

## 2022-12-17 LAB — RAD ONC ARIA SESSION SUMMARY
Course Elapsed Days: 41
Plan Fractions Treated to Date: 28
Plan Prescribed Dose Per Fraction: 2.5 Gy
Plan Total Fractions Prescribed: 28
Plan Total Prescribed Dose: 70 Gy
Reference Point Dosage Given to Date: 70 Gy
Reference Point Session Dosage Given: 2.5 Gy
Session Number: 28

## 2022-12-17 LAB — TOTAL PROTEIN, URINE DIPSTICK: Protein, ur: NEGATIVE mg/dL

## 2022-12-17 MED ORDER — SODIUM CHLORIDE 0.9 % IV SOLN
5.0000 mg/kg | Freq: Once | INTRAVENOUS | Status: AC
  Administered 2022-12-17: 400 mg via INTRAVENOUS
  Filled 2022-12-17: qty 16

## 2022-12-17 MED ORDER — HEPARIN SOD (PORK) LOCK FLUSH 100 UNIT/ML IV SOLN
500.0000 [IU] | Freq: Once | INTRAVENOUS | Status: AC | PRN
  Administered 2022-12-17: 500 [IU]

## 2022-12-17 MED ORDER — SODIUM CHLORIDE 0.9 % IV SOLN: Freq: Once | INTRAVENOUS | Status: AC

## 2022-12-17 MED ORDER — SODIUM CHLORIDE 0.9% FLUSH
10.0000 mL | INTRAVENOUS | Status: DC | PRN
  Administered 2022-12-17: 10 mL

## 2022-12-17 NOTE — Progress Notes (Signed)
[facility] [phone]   [phone]  OFFICE PROGRESS NOTE  Mathew Devon, MD [address] 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  PRIOR THERAPY:  1) status post resection and the patient has recurrence and October 2021 status post resection again under the care of Dr. Dorris Fetch. 2) Sunitinib 3 7.5 mg p.o. daily.  First dose started April 08, 2021.  Status post 3 months of treatment.  This treatment was discontinued secondary to disease progression. 3) Votrient (pazopanib) 800 mg p.o. daily.  Started August 10, 2021.  Status post 2 months of treatment discontinued secondary to disease progression. 4) status post redo right thoracotomy with resection of pleural tumors and lymph node dissection under the care of Dr. Dorris Fetch on Nov 13, 2021. 5) Systemic chemotherapy with doxorubicin 25 Mg/M2, ifosfamide 2500 Mg/M2 and mesna 2500 Mg/M2 days 1-3 every 3 weeks.  First dose February 12, 2022 at [facility] cancer center.  Status post 2 cycles of treatment discontinued secondary to disease progression. 6) radiotherapy to the prostate cancer last fraction on 12/17/2022 that the care of Dr. Kathrynn Running   CURRENT THERAPY: He is expected to start treatment with systemic chemotherapy with Temodar 200 Mg/M2 on days 1-7 and day 15-21 with a Avastin 5 Mg/KG on days 8 and 22 every 4 weeks.  Status post 7 cycles  INTERVAL HISTORY: Mathew [name] 62 y.o. male returns to clinic today for follow-up visit.  The patient is feeling fine today with no concerning complaints except for arthralgia recently.  He is currently undergoing curative radiotherapy to the prostate cancer and last fraction is today.  He denied having any current chest pain, shortness of  breath, cough or hemoptysis.  He has no nausea, vomiting, diarrhea or constipation.  He has no headache or visual changes.  He has no recent weight loss or night sweats.  He is here today for evaluation before starting day #22 of cycle #7.  MEDICAL HISTORY: Past Medical History:  Diagnosis Date   Anemia    Clubbing of nails    Deafness in left ear    Encounter for antineoplastic chemotherapy    History of kidney stones    Hypertension    Hypoglycemia due to neoplasm    Malignant neoplasm prostate [facility]) 07/2022   urologist--- dr pace;   bx 01/ 2024, Gleason 4+3   Malignant solitary fibrous neoplasm (HCC) 11/2015   oncologist--- @ [facility] Dr R. 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  MEDICATIONS:  Current Outpatient Medications  Medication Sig Dispense Refill   amLODipine (NORVASC) 10 MG tablet Take 10 mg by mouth daily. (Patient not taking: Reported on 11/06/2022)     bevacizumab-awwb (MVASI)  400 MG/16ML SOLN Inject 5 mg/kg into the vein once.     temozolomide (TEMODAR) 100 MG capsule Take 100 mg by mouth at bedtime.     No current facility-administered medications for this visit.   Facility-Administered Medications Ordered in Other Visits  Medication Dose Route Frequency Provider Last Rate Last Admin   sodium phosphate (FLEET) 7-19 GM/118ML enema 1 enema  1 enema Rectal Once Noel Christmas, MD       sodium phosphate (FLEET) 7-19 GM/118ML enema 1 enema  1 enema Rectal Once Noel Christmas, MD        SURGICAL HISTORY:  Past  Surgical History:  Procedure Laterality Date   GOLD SEED IMPLANT N/A 10/21/2022   Procedure: GOLD SEED IMPLANT;  Surgeon: Noel Christmas, MD;  Location: [facility];  Service: Urology;  Laterality: N/A;   INTERCOSTAL NERVE BLOCK Right 04/27/2020   Procedure: INTERCOSTAL NERVE BLOCK;  Surgeon: Loreli Slot, MD;  Location: [facility] OR;  Service: Thoracic;  Laterality: Right;   KNEE CARTILAGE SURGERY Left 1976   RESECTION OF MEDIASTINAL MASS Right 01/23/2016   @[facility]  by dr Dorris Fetch;   resection chest wall mass w/ en bloc wedge resection of RLL   RESECTION OF MEDIASTINAL MASS Right 11/13/2021   Procedure: RESECTION OF PLEURAL TUMORS;  Surgeon: Loreli Slot, MD;  Location[facility];  Service: Thoracic;  Laterality: Right;   SPACE OAR INSTILLATION N/A 10/21/2022   Procedure: SPACE OAR INSTILLATION;  Surgeon: Noel Christmas, MD;  Location: [facility];  Service: Urology;  Laterality: N/A;   THORACOTOMY Right 04/27/2020   Procedure: THORACOTOMY;  Surgeon: Loreli Slot, MD;  Location: [facility] OR;  Service: Thoracic;  Laterality: Right;   THORACOTOMY Right 11/13/2021   Procedure: REDO THORACOTOMY;  Surgeon: Loreli Slot, MD;  Location: [facility] OR;  Service: Thoracic;  Laterality: Right;   TONSILLECTOMY     child   VIDEO BRONCHOSCOPY WITH ENDOBRONCHIAL ULTRASOUND N/A 12/17/2015   Procedure: VIDEO BRONCHOSCOPY WITH ENDOBRONCHIAL ULTRASOUND;  Surgeon: Loreli Slot, MD;  Location: [facility] OR;  Service: Thoracic;  Laterality: N/A;    REVIEW OF SYSTEMS:  A comprehensive review of systems was negative except for: Musculoskeletal: positive for arthralgias   PHYSICAL EXAMINATION: General appearance: alert, cooperative, and no distress Head: Normocephalic, without obvious abnormality, atraumatic Neck: no adenopathy, no JVD, supple, symmetrical, trachea midline, and thyroid not enlarged, symmetric, no tenderness/mass/nodules Lymph nodes: Cervical,  supraclavicular, and axillary nodes normal. Resp: clear to auscultation bilaterally Back: symmetric, no curvature. ROM normal. No CVA tenderness. Cardio: regular rate and rhythm, S1, S2 normal, no murmur, click, rub or gallop GI: soft, non-tender; bowel sounds normal; no masses,  no organomegaly Extremities: extremities normal, atraumatic, no cyanosis or edema  ECOG PERFORMANCE STATUS: 1 - Symptomatic but completely ambulatory  Blood pressure 125/85, pulse 67, temperature 97.7 F (36.5 C), temperature source Oral, resp. rate 18, height 5\' 9"  (1.753 m), weight 164 lb 6.4 oz (74.6 kg), SpO2 99 %.  LABORATORY DATA: Lab Results  Component Value Date   WBC 4.2 12/04/2022   HGB 13.4 12/04/2022   HCT 41.8 12/04/2022   MCV 99.1 12/04/2022   PLT 153 12/04/2022      Chemistry      Component Value Date/Time   NA 141 12/04/2022 0837   K 3.5 12/04/2022 0837   CL 106 12/04/2022 0837   CO2 30 12/04/2022 0837   BUN 12 12/04/2022 0837   CREATININE 1.19 12/04/2022 1610  Component Value Date/Time   CALCIUM 9.4 12/04/2022 0837   ALKPHOS 88 12/04/2022 0837   AST 19 12/04/2022 0837   ALT 13 12/04/2022 0837   BILITOT 0.6 12/04/2022 0837       RADIOGRAPHIC STUDIES: No results found.  ASSESSMENT AND PLAN: This is a very pleasant 62 years old male with recurrent solitary fibrous tumor of the right lung diagnosed in June 2017 s/p resection followed by recurrence in October 2021 status post reresection under the care of Dr. Dorris Fetch. The patient has been on observation since his resection.  There was no role for adjuvant therapy for his condition.  He was seen by Dr. Lin Givens at [facility] who recommended for him observation and consideration of treatment with sunitinib followed by surgery if the patient has disease recurrence He had repeat CT scan of the chest performed recently.  The scan showed stable disease except for a new 1.4 cm nodular component posteriorly. The patient  is currently undergoing treatment with sunitinib 37.5 mg p.o. daily status post 3 months of treatment.  The patient has been tolerating his treatment with Sutent fairly well except for the hypertension. Unfortunately has a scan showed evidence for disease progression with interval development of multiple new pleural-based nodules in the right hemithorax measuring up to 2.9 cm concerning for recurrent/metastatic disease. The patient was seen recently by Dr. Dorris Fetch who offered surgical resection by Dr. Lin Givens recommended neoadjuvant treatment with pazopanib for 8 weeks followed by surgical resection if the patient has a stable or improvement of his disease followed by adjuvant treatment with Pazopanib for another 6-8 months. He was treated with pazopanib 800 mg p.o. daily.  He is status post 2 months of treatment. Repeat CT scan showed clear evidence for disease progression with enlarging right pleural-based masses. The patient was referred to Dr. 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  I sent the tissue block to [facility] for molecular studies and PD-L1 expression.  Unfortunately the molecular studies showed no actionable mutations and his PD-L1 expression was negative. The patient was seen recently by Dr. Waymon Amato at [facility] cancer center and he received treatment with inpatient systemic chemotherapy with doxorubicin, ifosfamide and mesna every 3 weeks.  Status post 2 cycles.  He had fatigue and pancytopenia with this treatment. His treatment was discontinued secondary to disease progression. Dr. Waymon Amato recommended for the patient treatment with Temodar 200 Mg/M2 on days 1-7 and 15-21 in addition to Avastin 5 Mg/M2 on days 8 and 22 every 4 weeks.  Status post 7  cycles.  He is here today for evaluation before starting day #22 of cycle #7. The patient is feeling fine and his blood count is unremarkable for any abnormality.  I recommended for him to proceed with his treatment today as planned. I will see him back for follow-up visit in 2 weeks before starting day 8 of cycle #8.  Then we will see him every 4 weeks after that. For the prostate cancer, he is followed by Dr. Kathrynn Running.  He completed the course of curative radiotherapy today. The patient was advised to call immediately if he has any other concerning symptoms in the interval. The patient voices understanding of current disease status and treatment options and is in agreement with the current care plan.  All questions  were answered. The patient knows to call the clinic with any problems, questions or concerns. We can certainly see the patient much sooner if necessary.  The total time spent in the appointment was 20 minutes.  Disclaimer: This note was dictated with voice recognition software. Similar sounding words can inadvertently be transcribed and may not be corrected upon review.

## 2022-12-17 NOTE — Patient Instructions (Signed)
[facility]  Discharge Instructions: Thank you for choosing [facility] to provide your oncology and hematology care.   If you have a lab appointment with the Cancer Center, please go directly to the Cancer Center and check in at the registration area.   Wear comfortable clothing and clothing appropriate for easy access to any Portacath or PICC line.   We strive to give you quality time with your provider. You may need to reschedule your appointment if you arrive late (15 or more minutes).  Arriving late affects you and other patients whose appointments are after yours.  Also, if you miss three or more appointments without notifying the office, you may be dismissed from the clinic at the provider's discretion.      For prescription refill requests, have your pharmacy contact our office and allow 72 hours for refills to be completed.    Today you received the following chemotherapy and/or immunotherapy agents: Bevacizumab      To help prevent nausea and vomiting after your treatment, we encourage you to take your nausea medication as directed.  BELOW ARE SYMPTOMS THAT SHOULD BE REPORTED IMMEDIATELY: *FEVER GREATER THAN 100.4 F (38 C) OR HIGHER *CHILLS OR SWEATING *NAUSEA AND VOMITING THAT IS NOT CONTROLLED WITH YOUR NAUSEA MEDICATION *UNUSUAL SHORTNESS OF BREATH *UNUSUAL BRUISING OR BLEEDING *URINARY PROBLEMS (pain or burning when urinating, or frequent urination) *BOWEL PROBLEMS (unusual diarrhea, constipation, pain near the anus) TENDERNESS IN MOUTH AND THROAT WITH OR WITHOUT PRESENCE OF ULCERS (sore throat, sores in mouth, or a toothache) UNUSUAL RASH, SWELLING OR PAIN  UNUSUAL VAGINAL DISCHARGE OR ITCHING   Items with * indicate a potential emergency and should be followed up as soon as possible or go to the Emergency Department if any problems should occur.  Please show the CHEMOTHERAPY ALERT CARD or IMMUNOTHERAPY ALERT CARD at  check-in to the Emergency Department and triage nurse.  Should you have questions after your visit or need to cancel or reschedule your appointment, please contact [facility]  Dept: [phone]  and follow the prompts.  Office hours are 8:00 a.m. to 4:30 p.m. Monday - Friday. Please note that voicemails left after 4:00 p.m. may not be returned until the following business day.  We are closed weekends and major holidays. You have access to a nurse at all times for urgent questions. Please call the main number to the clinic Dept: [phone] and follow the prompts.   For any non-urgent questions, you may also contact your provider using MyChart. We now offer e-Visits for anyone 18 and older to request care online for non-urgent symptoms. For details visit [email or web address].   Also download the MyChart app! Go to the app store, search "MyChart", open the app, select [facility], and log in with your MyChart username and password.   

## 2022-12-19 NOTE — Radiation Completion Notes (Addendum)
  Radiation Oncology         [phone] ________________________________  Name: Mathew [name] MRN: [id number]  Date: 12/17/2022  DOB: 11-04-1960  Referring Physician: Si Gaul, M.D. Date of Service: 2022-12-19 Radiation Oncologist: Margaretmary Bayley, M.D. [facility]     RADIATION ONCOLOGY END OF TREATMENT NOTE     Diagnosis:  62 y.o. gentleman with Stage T1c adenocarcinoma of the prostate with Gleason score of 4+3, and PSA of 10.2.  Intent: Curative     ==========DELIVERED PLANS==========  First Treatment Date: 2022-11-06 - Last Treatment Date: 2022-12-17   Plan Name: Prostate Site: Prostate Technique: IMRT Mode: Photon Dose Per Fraction: 2.5 Gy Prescribed Dose (Delivered / Prescribed): 70 Gy / 70 Gy Prescribed Fxs (Delivered / Prescribed): 28 / 28     ==========ON TREATMENT VISIT DATES========== 2022-11-06, 2022-11-14, 2022-11-21, 2022-11-27, 2022-12-05, 2022-12-12, 2022-12-17    See weekly On Treatment Notes in Epic for details.  He tolerated the radiation treatments fairly well with some mild dysuria, increased nocturia and occasional loose stools.  The patient will receive a call in about one month from the radiation oncology department. He will continue follow up with his urologist, Dr. Arita Miss as well.  ------------------------------------------------   Margaretmary Dys, MD [facility]  Radiation Oncology Direct Dial: [phone]  Fax: [phone] [email or web address]  Skype  LinkedIn

## 2022-12-22 ENCOUNTER — Other Ambulatory Visit: Payer: Self-pay

## 2022-12-26 NOTE — Progress Notes (Signed)
Patient was a RadOnc Consult on 08/25/22 for his stage T1c adenocarcinoma of the prostate with Gleason score of 4+3, and PSA of 10.2. Patient proceed with treatment recommendations of 5.5 weeks of daily radiation and had his final radiation treatment on 12/17/22.   RN placed request to get scheduled for a post treatment nurse call and has his follow up's with urology on 8/14 with Dr. Arita Miss who will follow for ongoing PSA checks.   RN left message with patient for call back to review post treatment education for PSA monitoring.

## 2022-12-29 NOTE — Progress Notes (Signed)
[facility] OFFICE PROGRESS NOTE  [name], MD 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  PRIOR THERAPY: 1) status post resection and the patient has recurrence and October 2021 status post resection again under the care of Dr. Dorris Fetch. 2) Sunitinib 3 7.5 mg p.o. daily.  First dose started April 08, 2021.  Status post 3 months of treatment.  This treatment was discontinued secondary to disease progression. 3) Votrient (pazopanib) 800 mg p.o. daily.  Started August 10, 2021.  Status post 2 months of treatment discontinued secondary to disease progression. 4) status post redo right thoracotomy with resection of pleural tumors and lymph node dissection under the care of Dr. Dorris Fetch on Nov 13, 2021. 5) Systemic chemotherapy with doxorubicin 25 Mg/M2, ifosfamide 2500 Mg/M2 and mesna 2500 Mg/M2 days 1-3 every 3 weeks.  First dose February 12, 2022 at [facility].  Status post 2 cycles of treatment discontinued secondary to disease progression. 6) Radiation to the prostate cancer, under the care of Dr. Kathrynn Running. Last day of radiation on 12/17/22  CURRENT THERAPY: Systemic chemotherapy with Temodar 200 Mg/M2 on days 1-7 and day 15-21 with a Avastin 5 Mg/KG on days 8 and 22 every 4 weeks.  Status post 8 cycles    INTERVAL HISTORY: Mathew [name] 62 y.o. male returns to the clinic today for a follow-up visit. In summary, the patient is currently undergoing treatment for his fibrous tumor of the lung by our clinic with Avastin IV every 2 weeks and Temodar bimonthly.  He is overall tolerating this well. He mentions they had to use cathflo on his port today.   The patient also was recently diagnosed with prostate cancer after he had a biopsy in  January 2024. He recently completed radiation under the care of Dr. Kathrynn Running.  He has some burning with urination as he was told he may experience with radiation and some increase in his loose bowel movements. Overall, the urinary changes are getting a little better. The loose stools are also getting better. He states he has not needed to take imodium recently.   Otherwise the patient is feeling well today. He does sometimes experience fatigue.  Denies any blood in the stool.  He denies any fever, chills, or night sweats.  Overall, his breathing is at his baseline. Denies any abnormal bleeding or bruising. Denies any headaches. He does feel like his vision is slightly more blurry but he reports it has been awhile since he had his eyes checked. He states he recently looked up eye doctors within his network and is going to call to make an appointment. He has nausea/vomiting the first day of Temodar and some nausea the following week. He has zofran at home but has not been taking this and sometimes does not have it with him.  He also follows with Dr. Waymon Amato from [facility] see him on 8/6 with a repeat CT scan same day. He is here today for evaluation repeat blood work before undergoing day 8 cycle #9.   MEDICAL HISTORY: Past Medical History:  Diagnosis Date   Anemia    Clubbing of nails    Deafness in left ear    Encounter for antineoplastic chemotherapy    History of kidney stones    Hypertension  Hypoglycemia due to neoplasm    Malignant neoplasm prostate [facility]) 07/2022   urologist--- dr pace;   bx 01/ 2024, Gleason 4+3   Malignant solitary fibrous neoplasm [facility]) 11/2015   oncologist--- @ [facility] Dr R. 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  MEDICATIONS:  Current Outpatient Medications  Medication Sig Dispense Refill   amLODipine (NORVASC) 10 MG tablet Take 10 mg by mouth daily. (Patient not taking: Reported on 11/06/2022)     bevacizumab-awwb (MVASI) 400 MG/16ML SOLN Inject 5 mg/kg into the vein once.     temozolomide (TEMODAR) 100 MG capsule Take 100 mg by mouth at bedtime.     No current facility-administered medications for this visit.   Facility-Administered Medications Ordered in Other Visits  Medication Dose Route Frequency Provider Last Rate Last Admin   sodium phosphate (FLEET) 7-19 GM/118ML enema 1 enema  1 enema Rectal Once Noel Christmas, MD       sodium phosphate (FLEET) 7-19 GM/118ML enema 1 enema  1 enema Rectal Once Noel Christmas, MD        SURGICAL HISTORY:  Past Surgical History:  Procedure Laterality Date   GOLD SEED IMPLANT N/A 10/21/2022   Procedure: GOLD SEED IMPLANT;  Surgeon: Noel Christmas, MD;  Location: [facility];  Service: Urology;  Laterality: N/A;   INTERCOSTAL NERVE BLOCK Right 04/27/2020   Procedure: INTERCOSTAL NERVE BLOCK;  Surgeon: Loreli Slot, MD;  Location: [facility] OR;  Service: Thoracic;  Laterality: Right;   KNEE CARTILAGE SURGERY Left 1976   RESECTION OF MEDIASTINAL MASS Right 01/23/2016   @[facility]  by dr Dorris Fetch;   resection chest wall mass w/ en bloc wedge resection of RLL   RESECTION OF MEDIASTINAL MASS Right 11/13/2021   Procedure: RESECTION OF PLEURAL TUMORS;  Surgeon: Loreli Slot, MD;  Location: [facility] OR;  Service: Thoracic;  Laterality: Right;   SPACE OAR INSTILLATION N/A 10/21/2022   Procedure: SPACE OAR INSTILLATION;  Surgeon: Noel Christmas, MD;  Location: [facility];  Service: Urology;  Laterality: N/A;   THORACOTOMY  Right 04/27/2020   Procedure: THORACOTOMY;  Surgeon: Loreli Slot, MD;  Location: [facility] OR;  Service: Thoracic;  Laterality: Right;   THORACOTOMY Right 11/13/2021   Procedure: REDO THORACOTOMY;  Surgeon: Loreli Slot, MD;  Location: [facility] OR;  Service: Thoracic;  Laterality: Right;   TONSILLECTOMY     child   VIDEO BRONCHOSCOPY WITH ENDOBRONCHIAL ULTRASOUND N/A 12/17/2015   Procedure: VIDEO BRONCHOSCOPY WITH ENDOBRONCHIAL ULTRASOUND;  Surgeon: Loreli Slot, MD;  Location: [facility] OR;  Service: Thoracic;  Laterality: N/A;    REVIEW OF SYSTEMS:   Constitutional: Positive for fatigue. Negative for appetite change, chills, fever and unexpected weight change.  HENT: Negative for mouth sores, nosebleeds, sore throat and trouble swallowing.   Eyes: Negative for eye problems and icterus.  Respiratory: Negative for cough, hemoptysis, shortness of breath and wheezing.  Cardiovascular: Negative for chest pain and leg swelling.  Gastrointestinal: Positive for loose stool (manageable/improving). Negative for abdominal pain, constipation, nausea and vomiting.  Genitourinary: Positive for some burning with urination (improving). Negative for bladder incontinence, difficulty urinating, frequency and hematuria.   Musculoskeletal: Negative for back pain, gait problem, neck pain and neck stiffness.  Skin: Negative for itching and rash.  Neurological: Negative for dizziness, extremity weakness, gait problem, headaches, light-headedness and seizures.  Hematological: Negative for adenopathy. Does not bruise/bleed easily.  Psychiatric/Behavioral: Negative for confusion, depression and sleep disturbance. The patient is not nervous/anxious.     PHYSICAL EXAMINATION:  Blood pressure (!) 144/87, pulse 66, temperature 98.3 F (36.8 C), resp. rate 20, weight 164 lb (74.4 kg), SpO2 100%.  ECOG PERFORMANCE STATUS: 1  Physical Exam  Constitutional: Oriented to person, place, and time and  well-developed, well-nourished, and in no distress.  HENT:  Head: Normocephalic and atraumatic.  Mouth/Throat: Oropharynx is clear and moist. No oropharyngeal exudate.  Eyes: Conjunctivae are normal. Right eye exhibits no discharge. Left eye exhibits no discharge. No scleral icterus.  Neck: Normal range of motion. Neck supple.  Cardiovascular: Normal rate, regular rhythm, normal heart sounds and intact distal pulses.   Pulmonary/Chest: Effort normal.  Quiet breath sounds in the right lung.  No respiratory distress. No wheezes. No rales.  Abdominal: Soft. Bowel sounds are normal. Exhibits no distension and no mass. There is no tenderness. He had mild soreness over his umbilicus itself. He lifts weights and it may be musculoskeletal.  Musculoskeletal: Normal range of motion. Lymphadenopathy:    No cervical adenopathy.  Neurological: Alert and oriented to person, place, and time. Exhibits normal muscle tone. Gait normal. Coordination normal.  Skin: Dry skin/peeling on right heel. Skin is warm and dry. No rash noted. Not diaphoretic. No erythema. No pallor.  Psychiatric: Mood, memory and judgment normal.  Vitals reviewed.  LABORATORY DATA: Lab Results  Component Value Date   WBC 5.4 01/01/2023   HGB 14.0 01/01/2023   HCT 43.0 01/01/2023   MCV 97.7 01/01/2023   PLT 186 01/01/2023      Chemistry      Component Value Date/Time   NA 141 12/04/2022 0837   K 3.5 12/04/2022 0837   CL 106 12/04/2022 0837   CO2 30 12/04/2022 0837   BUN 12 12/04/2022 0837   CREATININE 1.19 12/04/2022 0837      Component Value Date/Time   CALCIUM 9.4 12/04/2022 0837   ALKPHOS 88 12/04/2022 0837   AST 19 12/04/2022 0837   ALT 13 12/04/2022 0837   BILITOT 0.6 12/04/2022 0837       RADIOGRAPHIC STUDIES:  No results found.   ASSESSMENT/PLAN:  This is a very pleasant 62 year old male with  1) recurrent solitary fibrous tumor of the right lung.  This was initially diagnosed in June 2017.  2) prostate  adenocarcinoma with Gleason score of 7 (4+3) diagnosed in February 2024 followed by urology.  He is status post resection.  He had recurrence in October 2021 and underwent re-resection under the care of Dr. Dorris Fetch.  He had been on observation as there is no role for adjuvant therapy for his condition.  He was seen by Dr. Lin Givens at [facility] who recommended observation and consideration of treatment with Sunitinib followed by surgery if the patient had disease recurrence.    The patient had a new 1.4 cm nodule posteriorly and the patient underwent treatment with sunitinib 37.5 mg p.o. daily.  He was on this  for 3 months. He had been tolerating this fairly well except for hypertension.  Unfortunately scan showed evidence of disease progression with interval development of multiple new pleural-based nodules in the right hemithorax measuring up to 2.9 cm which is concerning for recurrent/metastatic disease.   He was then seen by Dr. Dorris Fetch who offered surgical resection.  Dr. Lin Givens recommended neoadjuvant treatment with pazopanib for 8 weeks followed by surgical resection if he had improvement in his disease followed by continued adjuvant treatment for another 6 to 8 months.  Unfortunately, after 2 months of treatment the CT scan showed clear evidence of disease progression with enlarging right pleural-based masses.  He was referred to Dr. 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  Molecular studies by foundation 1 and PD-L1 did not show any actionable mutations and his PD-L1 expression was negative.    The patient then saw Dr. Waymon Amato at [facility] who recommended inpatient systemic chemotherapy with doxorubicin, ifosfamide, and mesna IV every  3 weeks.  He had a 2D echo as well as a Port-A-Cath placed.  This was discontinued due to evidence of disease progression.   Dr. Waymon Amato recommended for the patient treatment with Temodar 200 Mg/M2 on days 1-7 and 15-21 in addition to Avastin 5 Mg/M2 on days 8 and 22 every 4 weeks.  He is here today for day 8 cycle 9.  He is scheduled to see. Dr. Waymon Amato and repeat CT on 01/20/23.    Labs were reviewed.  Recommend that he proceed with cycle number 9-day 8 today as scheduled.     He recently completed radiation to the prostate cancer under the care of Dr. Kathrynn Running. His last day of radiation was on 12/17/22.   We will see him back in 4 weeks for evaluation repeat blood work before starting day 8 cycle #10. We will see him every other infusion.   Hopefully the cath flo will help with his port-a-cath blood return. If not, then I will arrange for a IR dye study.   The patient was advised to call immediately if he has any concerning symptoms in the interval. The patient voices understanding of current disease status and treatment options and is in agreement with the current care plan. All questions were answered. The patient knows to call the clinic with any problems, questions or concerns. We can certainly see the patient much sooner if necessary   No orders of the defined types were placed in this encounter.    The total time spent in the appointment was 20-29 minutes  Niomi Valent L Aneta Hendershott, PA-C 01/01/23

## 2022-12-31 ENCOUNTER — Ambulatory Visit: Payer: BC Managed Care – PPO | Admitting: Physician Assistant

## 2022-12-31 ENCOUNTER — Other Ambulatory Visit: Payer: BC Managed Care – PPO

## 2022-12-31 ENCOUNTER — Ambulatory Visit: Payer: BC Managed Care – PPO

## 2023-01-01 ENCOUNTER — Other Ambulatory Visit: Payer: BC Managed Care – PPO

## 2023-01-01 ENCOUNTER — Other Ambulatory Visit: Payer: Self-pay

## 2023-01-01 ENCOUNTER — Inpatient Hospital Stay: Payer: BC Managed Care – PPO | Admitting: Physician Assistant

## 2023-01-01 ENCOUNTER — Inpatient Hospital Stay: Payer: BC Managed Care – PPO

## 2023-01-01 VITALS — BP 143/81 | HR 57

## 2023-01-01 DIAGNOSIS — Z79899 Other long term (current) drug therapy: Secondary | ICD-10-CM | POA: Diagnosis not present

## 2023-01-01 DIAGNOSIS — Z87442 Personal history of urinary calculi: Secondary | ICD-10-CM | POA: Diagnosis not present

## 2023-01-01 DIAGNOSIS — M1990 Unspecified osteoarthritis, unspecified site: Secondary | ICD-10-CM | POA: Insufficient documentation

## 2023-01-01 DIAGNOSIS — D649 Anemia, unspecified: Secondary | ICD-10-CM | POA: Insufficient documentation

## 2023-01-01 DIAGNOSIS — R5383 Other fatigue: Secondary | ICD-10-CM | POA: Insufficient documentation

## 2023-01-01 DIAGNOSIS — Z79630 Long term (current) use of alkylating agent: Secondary | ICD-10-CM | POA: Insufficient documentation

## 2023-01-01 DIAGNOSIS — E559 Vitamin D deficiency, unspecified: Secondary | ICD-10-CM | POA: Diagnosis not present

## 2023-01-01 DIAGNOSIS — R112 Nausea with vomiting, unspecified: Secondary | ICD-10-CM | POA: Diagnosis not present

## 2023-01-01 DIAGNOSIS — Z95828 Presence of other vascular implants and grafts: Secondary | ICD-10-CM

## 2023-01-01 DIAGNOSIS — Z5189 Encounter for other specified aftercare: Secondary | ICD-10-CM | POA: Insufficient documentation

## 2023-01-01 DIAGNOSIS — I10 Essential (primary) hypertension: Secondary | ICD-10-CM | POA: Diagnosis not present

## 2023-01-01 DIAGNOSIS — H9192 Unspecified hearing loss, left ear: Secondary | ICD-10-CM | POA: Insufficient documentation

## 2023-01-01 DIAGNOSIS — R21 Rash and other nonspecific skin eruption: Secondary | ICD-10-CM | POA: Insufficient documentation

## 2023-01-01 DIAGNOSIS — D61818 Other pancytopenia: Secondary | ICD-10-CM | POA: Diagnosis not present

## 2023-01-01 DIAGNOSIS — E161 Other hypoglycemia: Secondary | ICD-10-CM | POA: Insufficient documentation

## 2023-01-01 DIAGNOSIS — C61 Malignant neoplasm of prostate: Secondary | ICD-10-CM | POA: Diagnosis not present

## 2023-01-01 DIAGNOSIS — D492 Neoplasm of unspecified behavior of bone, soft tissue, and skin: Secondary | ICD-10-CM | POA: Diagnosis not present

## 2023-01-01 DIAGNOSIS — R918 Other nonspecific abnormal finding of lung field: Secondary | ICD-10-CM

## 2023-01-01 DIAGNOSIS — C782 Secondary malignant neoplasm of pleura: Secondary | ICD-10-CM | POA: Diagnosis not present

## 2023-01-01 LAB — CBC WITH DIFFERENTIAL (CANCER CENTER ONLY)
Abs Immature Granulocytes: 0.02 10*3/uL (ref 0.00–0.07)
Basophils Absolute: 0 10*3/uL (ref 0.0–0.1)
Basophils Relative: 0 %
Eosinophils Absolute: 0.5 10*3/uL (ref 0.0–0.5)
Eosinophils Relative: 9 %
HCT: 43 % (ref 39.0–52.0)
Hemoglobin: 14 g/dL (ref 13.0–17.0)
Immature Granulocytes: 0 %
Lymphocytes Relative: 8 %
Lymphs Abs: 0.4 10*3/uL — ABNORMAL LOW (ref 0.7–4.0)
MCH: 31.8 pg (ref 26.0–34.0)
MCHC: 32.6 g/dL (ref 30.0–36.0)
MCV: 97.7 fL (ref 80.0–100.0)
Monocytes Absolute: 0.6 10*3/uL (ref 0.1–1.0)
Monocytes Relative: 12 %
Neutro Abs: 3.9 10*3/uL (ref 1.7–7.7)
Neutrophils Relative %: 71 %
Platelet Count: 186 10*3/uL (ref 150–400)
RBC: 4.4 MIL/uL (ref 4.22–5.81)
RDW: 13.1 % (ref 11.5–15.5)
WBC Count: 5.4 10*3/uL (ref 4.0–10.5)
nRBC: 0 % (ref 0.0–0.2)

## 2023-01-01 LAB — CMP (CANCER CENTER ONLY)
ALT: 11 U/L (ref 0–44)
AST: 15 U/L (ref 15–41)
Albumin: 3.8 g/dL (ref 3.5–5.0)
Alkaline Phosphatase: 88 U/L (ref 38–126)
Anion gap: 5 (ref 5–15)
BUN: 16 mg/dL (ref 8–23)
CO2: 31 mmol/L (ref 22–32)
Calcium: 9.5 mg/dL (ref 8.9–10.3)
Chloride: 104 mmol/L (ref 98–111)
Creatinine: 1.18 mg/dL (ref 0.61–1.24)
GFR, Estimated: >60 mL/min (ref >60)
Glucose, Bld: 92 mg/dL (ref 70–99)
Potassium: 3.7 mmol/L (ref 3.5–5.1)
Sodium: 140 mmol/L (ref 135–145)
Total Bilirubin: 0.4 mg/dL (ref 0.3–1.2)
Total Protein: 7.2 g/dL (ref 6.5–8.1)

## 2023-01-01 LAB — TOTAL PROTEIN, URINE DIPSTICK: Protein, ur: NEGATIVE mg/dL

## 2023-01-01 MED ORDER — SODIUM CHLORIDE 0.9% FLUSH
10.0000 mL | INTRAVENOUS | Status: DC | PRN
  Administered 2023-01-01: 10 mL

## 2023-01-01 MED ORDER — ALTEPLASE 2 MG IJ SOLR
2.0000 mg | Freq: Once | INTRAMUSCULAR | Status: AC
  Administered 2023-01-01: 2 mg
  Filled 2023-01-01: qty 2

## 2023-01-01 MED ORDER — SODIUM CHLORIDE 0.9 % IV SOLN
5.0000 mg/kg | Freq: Once | INTRAVENOUS | Status: AC
  Administered 2023-01-01: 400 mg via INTRAVENOUS
  Filled 2023-01-01: qty 16

## 2023-01-01 MED ORDER — SODIUM CHLORIDE 0.9 % IV SOLN: Freq: Once | INTRAVENOUS | Status: AC

## 2023-01-01 MED ORDER — HEPARIN SOD (PORK) LOCK FLUSH 100 UNIT/ML IV SOLN
500.0000 [IU] | Freq: Once | INTRAVENOUS | Status: AC | PRN
  Administered 2023-01-01: 500 [IU]

## 2023-01-01 MED ORDER — SODIUM CHLORIDE 0.9% FLUSH
10.0000 mL | Freq: Once | INTRAVENOUS | Status: AC
  Administered 2023-01-01: 10 mL

## 2023-01-01 NOTE — Patient Instructions (Signed)
[facility]  Discharge Instructions: Thank you for choosing [facility] to provide your oncology and hematology care.   If you have a lab appointment with the [facility], please go directly to the [facility] and check in at the registration area.   Wear comfortable clothing and clothing appropriate for easy access to any Portacath or PICC line.   We strive to give you quality time with your provider. You may need to reschedule your appointment if you arrive late (15 or more minutes).  Arriving late affects you and other patients whose appointments are after yours.  Also, if you miss three or more appointments without notifying the office, you may be dismissed from the clinic at the provider's discretion.      For prescription refill requests, have your pharmacy contact our office and allow 72 hours for refills to be completed.    Today you received the following chemotherapy and/or immunotherapy agents: Bevacizumab      To help prevent nausea and vomiting after your treatment, we encourage you to take your nausea medication as directed.  BELOW ARE SYMPTOMS THAT SHOULD BE REPORTED IMMEDIATELY: *FEVER GREATER THAN 100.4 F (38 C) OR HIGHER *CHILLS OR SWEATING *NAUSEA AND VOMITING THAT IS NOT CONTROLLED WITH YOUR NAUSEA MEDICATION *UNUSUAL SHORTNESS OF BREATH *UNUSUAL BRUISING OR BLEEDING *URINARY PROBLEMS (pain or burning when urinating, or frequent urination) *BOWEL PROBLEMS (unusual diarrhea, constipation, pain near the anus) TENDERNESS IN MOUTH AND THROAT WITH OR WITHOUT PRESENCE OF ULCERS (sore throat, sores in mouth, or a toothache) UNUSUAL RASH, SWELLING OR PAIN  UNUSUAL VAGINAL DISCHARGE OR ITCHING   Items with * indicate a potential emergency and should be followed up as soon as possible or go to the Emergency Department if any problems should occur.  Please show the CHEMOTHERAPY ALERT CARD or IMMUNOTHERAPY ALERT CARD at  check-in to the Emergency Department and triage nurse.  Should you have questions after your visit or need to cancel or reschedule your appointment, please contact [facility]  Dept: [phone]  and follow the prompts.  Office hours are 8:00 a.m. to 4:30 p.m. Monday - Friday. Please note that voicemails left after 4:00 p.m. may not be returned until the following business day.  We are closed weekends and major holidays. You have access to a nurse at all times for urgent questions. Please call the main number to the clinic Dept: [phone] and follow the prompts.   For any non-urgent questions, you may also contact your provider using MyChart. We now offer e-Visits for anyone 82 and older to request care online for non-urgent symptoms. For details visit [email or web address].   Also download the MyChart app! Go to the app store, search "MyChart", open the app, select [facility], and log in with your MyChart username and password.

## 2023-01-02 NOTE — Progress Notes (Signed)
RN spoke with patient and confirmed appointments with [facility] and post treatment follow up nurse call.  RN provided education on post treatment PSA monitoring.  No additional needs at this time.

## 2023-01-08 ENCOUNTER — Ambulatory Visit: Payer: BC Managed Care – PPO | Admitting: Physician Assistant

## 2023-01-08 ENCOUNTER — Ambulatory Visit: Payer: BC Managed Care – PPO

## 2023-01-08 ENCOUNTER — Other Ambulatory Visit: Payer: BC Managed Care – PPO

## 2023-01-09 ENCOUNTER — Other Ambulatory Visit: Payer: Self-pay

## 2023-01-13 ENCOUNTER — Other Ambulatory Visit: Payer: Self-pay

## 2023-01-15 ENCOUNTER — Other Ambulatory Visit: Payer: Self-pay

## 2023-01-15 ENCOUNTER — Inpatient Hospital Stay: Payer: BC Managed Care – PPO

## 2023-01-15 ENCOUNTER — Ambulatory Visit: Payer: BC Managed Care – PPO | Admitting: Physician Assistant

## 2023-01-15 ENCOUNTER — Other Ambulatory Visit: Payer: BC Managed Care – PPO

## 2023-01-15 ENCOUNTER — Inpatient Hospital Stay: Payer: BC Managed Care – PPO | Attending: Internal Medicine

## 2023-01-15 VITALS — BP 142/78 | HR 68 | Temp 98.2°F | Resp 18 | Wt 164.2 lb

## 2023-01-15 DIAGNOSIS — Z87442 Personal history of urinary calculi: Secondary | ICD-10-CM | POA: Diagnosis not present

## 2023-01-15 DIAGNOSIS — R21 Rash and other nonspecific skin eruption: Secondary | ICD-10-CM | POA: Insufficient documentation

## 2023-01-15 DIAGNOSIS — R5383 Other fatigue: Secondary | ICD-10-CM | POA: Insufficient documentation

## 2023-01-15 DIAGNOSIS — I10 Essential (primary) hypertension: Secondary | ICD-10-CM | POA: Insufficient documentation

## 2023-01-15 DIAGNOSIS — Z5189 Encounter for other specified aftercare: Secondary | ICD-10-CM | POA: Diagnosis not present

## 2023-01-15 DIAGNOSIS — M1990 Unspecified osteoarthritis, unspecified site: Secondary | ICD-10-CM | POA: Diagnosis not present

## 2023-01-15 DIAGNOSIS — Z923 Personal history of irradiation: Secondary | ICD-10-CM | POA: Diagnosis not present

## 2023-01-15 DIAGNOSIS — E559 Vitamin D deficiency, unspecified: Secondary | ICD-10-CM | POA: Insufficient documentation

## 2023-01-15 DIAGNOSIS — C782 Secondary malignant neoplasm of pleura: Secondary | ICD-10-CM | POA: Insufficient documentation

## 2023-01-15 DIAGNOSIS — R0602 Shortness of breath: Secondary | ICD-10-CM | POA: Diagnosis not present

## 2023-01-15 DIAGNOSIS — C61 Malignant neoplasm of prostate: Secondary | ICD-10-CM | POA: Insufficient documentation

## 2023-01-15 DIAGNOSIS — D492 Neoplasm of unspecified behavior of bone, soft tissue, and skin: Secondary | ICD-10-CM

## 2023-01-15 DIAGNOSIS — Z79899 Other long term (current) drug therapy: Secondary | ICD-10-CM | POA: Insufficient documentation

## 2023-01-15 DIAGNOSIS — Z5111 Encounter for antineoplastic chemotherapy: Secondary | ICD-10-CM | POA: Diagnosis not present

## 2023-01-15 DIAGNOSIS — R531 Weakness: Secondary | ICD-10-CM | POA: Insufficient documentation

## 2023-01-15 DIAGNOSIS — R918 Other nonspecific abnormal finding of lung field: Secondary | ICD-10-CM

## 2023-01-15 DIAGNOSIS — D649 Anemia, unspecified: Secondary | ICD-10-CM | POA: Diagnosis not present

## 2023-01-15 DIAGNOSIS — Z95828 Presence of other vascular implants and grafts: Secondary | ICD-10-CM

## 2023-01-15 LAB — CBC WITH DIFFERENTIAL (CANCER CENTER ONLY)
Abs Immature Granulocytes: 0.03 10*3/uL (ref 0.00–0.07)
Basophils Absolute: 0 10*3/uL (ref 0.0–0.1)
Basophils Relative: 0 %
Eosinophils Absolute: 0.2 10*3/uL (ref 0.0–0.5)
Eosinophils Relative: 3 %
HCT: 43.2 % (ref 39.0–52.0)
Hemoglobin: 14.4 g/dL (ref 13.0–17.0)
Immature Granulocytes: 0 %
Lymphocytes Relative: 7 %
Lymphs Abs: 0.5 10*3/uL — ABNORMAL LOW (ref 0.7–4.0)
MCH: 31.6 pg (ref 26.0–34.0)
MCHC: 33.3 g/dL (ref 30.0–36.0)
MCV: 94.9 fL (ref 80.0–100.0)
Monocytes Absolute: 0.8 10*3/uL (ref 0.1–1.0)
Monocytes Relative: 11 %
Neutro Abs: 5.5 10*3/uL (ref 1.7–7.7)
Neutrophils Relative %: 79 %
Platelet Count: 200 10*3/uL (ref 150–400)
RBC: 4.55 MIL/uL (ref 4.22–5.81)
RDW: 13 % (ref 11.5–15.5)
WBC Count: 7 10*3/uL (ref 4.0–10.5)
nRBC: 0 % (ref 0.0–0.2)

## 2023-01-15 LAB — CMP (CANCER CENTER ONLY)
ALT: 9 U/L (ref 0–44)
AST: 13 U/L — ABNORMAL LOW (ref 15–41)
Albumin: 3.8 g/dL (ref 3.5–5.0)
Alkaline Phosphatase: 82 U/L (ref 38–126)
Anion gap: 7 (ref 5–15)
BUN: 16 mg/dL (ref 8–23)
CO2: 29 mmol/L (ref 22–32)
Calcium: 9.4 mg/dL (ref 8.9–10.3)
Chloride: 101 mmol/L (ref 98–111)
Creatinine: 1.17 mg/dL (ref 0.61–1.24)
GFR, Estimated: >60 mL/min (ref >60)
Glucose, Bld: 90 mg/dL (ref 70–99)
Potassium: 4.1 mmol/L (ref 3.5–5.1)
Sodium: 137 mmol/L (ref 135–145)
Total Bilirubin: 0.7 mg/dL (ref 0.3–1.2)
Total Protein: 7.2 g/dL (ref 6.5–8.1)

## 2023-01-15 LAB — TOTAL PROTEIN, URINE DIPSTICK: Protein, ur: NEGATIVE mg/dL

## 2023-01-15 MED ORDER — SODIUM CHLORIDE 0.9 % IV SOLN
5.0000 mg/kg | Freq: Once | INTRAVENOUS | Status: AC
  Administered 2023-01-15: 400 mg via INTRAVENOUS
  Filled 2023-01-15: qty 16

## 2023-01-15 MED ORDER — SODIUM CHLORIDE 0.9% FLUSH
10.0000 mL | Freq: Once | INTRAVENOUS | Status: AC
  Administered 2023-01-15: 10 mL

## 2023-01-15 MED ORDER — SODIUM CHLORIDE 0.9 % IV SOLN: Freq: Once | INTRAVENOUS | Status: AC

## 2023-01-18 ENCOUNTER — Encounter (HOSPITAL_COMMUNITY): Payer: Self-pay

## 2023-01-18 ENCOUNTER — Other Ambulatory Visit: Payer: Self-pay

## 2023-01-18 ENCOUNTER — Ambulatory Visit (HOSPITAL_COMMUNITY)
Admission: RE | Admit: 2023-01-18 | Discharge: 2023-01-18 | Disposition: A | Discharge status: Home / Self Care | Payer: BC Managed Care – PPO | Source: Ambulatory Visit | Attending: Family | Admitting: Family

## 2023-01-18 VITALS — BP 125/82 | HR 85 | Temp 98.3°F | Resp 18 | Ht 69.0 in | Wt 154.0 lb

## 2023-01-18 DIAGNOSIS — K5900 Constipation, unspecified: Secondary | ICD-10-CM

## 2023-01-18 DIAGNOSIS — K5641 Fecal impaction: Secondary | ICD-10-CM

## 2023-01-18 MED ORDER — MAGNESIUM CITRATE PO SOLN: 1.0000 | Freq: Once | ORAL | 0 refills | Status: DC

## 2023-01-18 MED ORDER — DOCUSATE SODIUM 100 MG PO CAPS: 100.0000 mg | ORAL_CAPSULE | Freq: Two times a day (BID) | ORAL | 0 refills | Status: DC

## 2023-01-18 NOTE — ED Triage Notes (Signed)
Pt endorse, rectal pain, no BM since Wednesday, increase fatigue.

## 2023-01-18 NOTE — Discharge Instructions (Addendum)
-  Your are transferred to the Emergency room to be evaluated for possible obstruction from fecal impaction.  -Take Colace 100 mg every 12 hours by mouth -Stay hydrated and eat plant-based diet with increasing to physical activities as tolerated -Go to the nearest emergency room with new or worsening symptoms. -Follow-up with your primary care provider within 7 days if symptoms persist

## 2023-01-18 NOTE — ED Provider Notes (Signed)
[facility]    CSN: [id number] Arrival date & time: 01/18/23  1131      History   Chief Complaint Chief Complaint  Patient presents with   Constipation    Have not been able to have a bowel movement in two days even with laxatives. I have rectal pain and discomfort. I recently had radiation treatment on my prostate. - Entered by patient    HPI Mathew [name] is a 62 y.o. male who presents today urgent care this afternoon for constipation.  Patient reports history of prostate cancer with radiation treatment on December 17, 2022 after this treatment patient developed constipation.  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  Patient tried MiraLAX and enema for the past 2 days without relief, now he is experiencing some nausea, rectal tenderness, increased fatigue, and did enema twice without relief yesterday.  Patient felt he was getting sick and did COVID test which was negative.  He has taken Dulcolax without relief as well.  He denies fever, chills, abdominal pain, shortness of breath or chest pain.  Has upcoming abdominal CT at [facility] on Wednesday next week.   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   OA (osteoarthritis)    Rash    10-15-2022  per pt due to chemo   Shortness of breath dyspnea    just initially   Vitamin D deficiency    Wears hearing  aid in both ears     Patient Active Problem List   Diagnosis Date Noted   Malignant neoplasm of prostate (HCC) 08/26/2022   Port-A-Cath in place 05/01/2022   At high risk for infection due to neutropenia 03/13/2022   Prevention of chemotherapy-induced neutropenia 03/13/2022   Hypokalemia 02/24/2022   Hypoglycemia 02/24/2022   Thrombocytopenia (HCC) 02/24/2022   Anemia 02/24/2022   Fever 02/24/2022   Hypertension 04/29/2021   Encounter for antineoplastic chemotherapy 03/29/2021   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   Chest pain 02/01/2016   S/P thoracotomy 01/23/2016   Right lower lobe lung mass 12/11/2015   Clubbing of nails     Past Surgical History:  Procedure Laterality Date   GOLD SEED IMPLANT N/A 10/21/2022   Procedure: GOLD SEED IMPLANT;  Surgeon: Noel Christmas, MD;  Location: [facility];  Service: Urology;  Laterality: N/A;   INTERCOSTAL NERVE BLOCK Right 04/27/2020   Procedure: INTERCOSTAL NERVE BLOCK;  Surgeon: Loreli Slot, MD;  Location: [facility] OR;  Service: Thoracic;  Laterality: Right;   KNEE CARTILAGE SURGERY Left 1976   RESECTION OF MEDIASTINAL MASS Right 01/23/2016   @[facility]  by dr Dorris Fetch;   resection chest wall mass w/ en bloc wedge resection of RLL   RESECTION OF MEDIASTINAL MASS Right 11/13/2021  Procedure: RESECTION OF PLEURAL TUMORS;  Surgeon: Loreli Slot, MD;  Location: [facility] OR;  Service: Thoracic;  Laterality: Right;   SPACE OAR INSTILLATION N/A 10/21/2022   Procedure: SPACE OAR INSTILLATION;  Surgeon: Noel Christmas, MD;  Location: [facility];  Service: Urology;  Laterality: N/A;   THORACOTOMY Right 04/27/2020   Procedure: THORACOTOMY;  Surgeon: Loreli Slot, MD;  Location: [facility] OR;  Service: Thoracic;  Laterality: Right;   THORACOTOMY Right 11/13/2021   Procedure: REDO THORACOTOMY;  Surgeon: Loreli Slot, MD;  Location[facility] OR;  Service: Thoracic;  Laterality: Right;   TONSILLECTOMY      child   VIDEO BRONCHOSCOPY WITH ENDOBRONCHIAL ULTRASOUND N/A 12/17/2015   Procedure: VIDEO BRONCHOSCOPY WITH ENDOBRONCHIAL ULTRASOUND;  Surgeon: Loreli Slot, MD;  Location: [facility] OR;  Service: Thoracic;  Laterality: N/A;       Home Medications    Prior to Admission medications   Medication Sig Start Date End Date Taking? Authorizing Provider  docusate sodium (COLACE) 100 MG capsule Take 1 capsule (100 mg total) by mouth every 12 (twelve) hours. 01/18/23  Yes Eleonore Chiquito, FNP  amLODipine (NORVASC) 10 MG tablet Take 10 mg by mouth daily. Patient not taking: Reported on 11/06/2022    [provider]  bevacizumab-awwb (MVASI) 400 MG/16ML SOLN Inject 5 mg/kg into the vein once.    [provider]  temozolomide (TEMODAR) 100 MG capsule Take 100 mg by mouth at bedtime. 09/24/22   [provider]    Family History Family History  Problem Relation Age of Onset   Diabetes type II Mother    Hypertension Father     Social History Social History   Tobacco Use   Smoking status: Never   Smokeless tobacco: Former    Types: Chew    Quit date: 1978  Vaping Use   Vaping status: Never Used  Substance Use Topics   Alcohol use: Not Currently    Comment: occasional (2-3 per month)   Drug use: Not Currently    Types: Marijuana    Comment: 10-15-2022  per pt used decade ago     Allergies   Aspirin and Penicillins   Review of Systems Review of Systems  Constitutional:  Positive for appetite change and fatigue. Negative for chills.  HENT: Negative.    Eyes: Negative.   Respiratory: Negative.    Cardiovascular: Negative.   Gastrointestinal:  Positive for constipation, nausea and rectal pain. Negative for abdominal distention, abdominal pain and vomiting.  Endocrine: Negative.   Genitourinary: Negative.   Musculoskeletal: Negative.   Skin: Negative.   Neurological: Negative.   Psychiatric/Behavioral: Negative.      Physical Exam Triage Vital  Signs ED Triage Vitals [01/18/23 1205]  Encounter Vitals Group     BP 125/82     Systolic BP Percentile      Diastolic BP Percentile      Pulse Rate 85     Resp 18     Temp 98.3 F (36.8 C)     Temp Source Oral     SpO2 99 %     Weight 154 lb (69.9 kg)     Height 5\' 9"  (1.753 m)     Head Circumference      Peak Flow      Pain Score 7     Pain Loc      Pain Education      Exclude from Growth Chart    No data found.  Updated  Vital Signs BP 125/82 (BP Location: Right Arm)   Pulse 85   Temp 98.3 F (36.8 C) (Oral)   Resp 18   Ht 5\' 9"  (1.753 m)   Wt 154 lb (69.9 kg)   SpO2 99%   BMI 22.74 kg/m   Visual Acuity Right Eye Distance:   Left Eye Distance:   Bilateral Distance:    Right Eye Near:   Left Eye Near:    Bilateral Near:     Physical Exam Constitutional:      Appearance: Normal appearance.  HENT:     Head: Normocephalic and atraumatic.     Nose: Nose normal.  Cardiovascular:     Rate and Rhythm: Normal rate and regular rhythm.  Pulmonary:     Effort: Pulmonary effort is normal.     Breath sounds: Normal breath sounds.  Abdominal:     General: Abdomen is flat. Bowel sounds are normal. There is no distension.     Palpations: Abdomen is soft. There is no mass.     Tenderness: There is no abdominal tenderness. There is no left CVA tenderness, guarding or rebound.     Hernia: No hernia is present.  Skin:    General: Skin is warm and dry.  Neurological:     Mental Status: He is alert and oriented to person, place, and time.  Psychiatric:        Behavior: Behavior normal.      UC Treatments / Results  Labs (all labs ordered are listed, but only abnormal results are displayed) Labs Reviewed - No data to display  EKG   Radiology No results found.  Procedures Procedures (including critical care time)  Medications Ordered in UC Medications - No data to display  Initial Impression / Assessment and Plan / UC Course  I have reviewed the triage  vital signs and the nursing notes. Pertinent labs & imaging results that were available during my care of the patient were reviewed by me and considered in my medical decision making (see chart for details).  Assessment: Mr. Walther is a 62 year old male who presents to urgent care this afternoon with complaints of constipation.  He has not had a normal bowel movement for the past 4 days and now his experiencing fatigue, rectal pain, nausea, and he is not feeling well at all.  Patient tried enema twice and has taken MiraLAX treatment twice with Dulcolax without relief.  To rule out any bowel obstruction he will need evaluation at the local emergency room.   PLAN:  1) Constipation:  -Take Colace as ordered twice per day.  -Increase physical activities as tolerated and oral fluid and eat plant-based diet.  -Patient referred to emergency room for further evaluation.  2) Fecal impaction in rectum:  -Patient transferred to local emergency room for further evaluation.  -Reminded to stay hydrated, eat plant-based diet high in fiber, and for new or worsening symptoms to his primary care providers or local emergency room.  Final Clinical Impressions(s) / UC Diagnoses   Final diagnoses:  Constipation, unspecified constipation type  Fecal impaction in rectum [facility])     Discharge Instructions      -Your are transferred to the Emergency room to be evaluated for possible obstruction from fecal impaction.  -Take Colace 100 mg every 12 hours by mouth -Stay hydrated and eat plant-based diet with increasing to physical activities as tolerated -Go to the nearest emergency room with new or worsening symptoms. -Follow-up with your primary care provider within 7  days if symptoms persist   ED Prescriptions     Medication Sig Dispense Auth. Provider   magnesium citrate SOLN  (Status: Discontinued) Take 296 mLs (1 Bottle total) by mouth once for 1 dose. 195 mL Eleonore Chiquito, FNP   docusate sodium  (COLACE) 100 MG capsule Take 1 capsule (100 mg total) by mouth every 12 (twelve) hours. 60 capsule Eleonore Chiquito, FNP      PDMP not reviewed this encounter.   Eleonore Chiquito, FNP 01/18/23 1401

## 2023-01-22 ENCOUNTER — Other Ambulatory Visit: Payer: BC Managed Care – PPO

## 2023-01-22 ENCOUNTER — Ambulatory Visit: Payer: BC Managed Care – PPO | Admitting: Internal Medicine

## 2023-01-22 ENCOUNTER — Ambulatory Visit: Payer: BC Managed Care – PPO

## 2023-01-23 ENCOUNTER — Telehealth: Payer: Self-pay

## 2023-01-23 ENCOUNTER — Other Ambulatory Visit: Payer: Self-pay

## 2023-01-23 NOTE — Telephone Encounter (Signed)
RN called Mathew [name] back with some follow up questions  from earlier call as it pertains to whether not not he's had any rectal bleeding,  last bowel movement ,and how many times he's having bowel movements.  Mathew [name] reports no rectal bleeding, BM was on 01/20/2023 with assist of enema, and more recent small BM today.  Mathew [name] did inform RN that his PCP has called in a prescription for Doxycycline 100 mg po BID for 10 days.  After informing Dr. Kathrynn Running of what PCP ordered he recommend waiting to see if medication will alleviate symptoms.  Mathew [name] was made aware of Dr. Broadus John response and thanked everyone for their assistance in this matter.

## 2023-01-23 NOTE — Telephone Encounter (Signed)
RN spoke with Mr. Rallo recently completed treatment for prostate cancer on 12/17/2022.  Mr. Voltaire expressed constipation, rectal discomfort, and body aches.  Denies fever, chills, abdominal pain, shortness of breath or chest pain.  Mr. Killion reports has tried all suggested OTC treatment such as stool softeners, Miralax, enemas, prep-H suppository, Sitz bath, and drinking water.  RN advised to eat high fiber foods can help also.  Recently saw his doctor at [facility] who thinks patient may need antibiotic treatment and to reach out to his radiation doctor.  Mr. Bussell reported tried to call [facility] to speak with someone and no one returned call.  RN advised that side effects of radiation treatment will eventually subside 4-8 weeks to continue suggested treatment.  RN to inform Dr. Kathrynn Running of concerns.

## 2023-01-24 ENCOUNTER — Other Ambulatory Visit: Payer: Self-pay

## 2023-01-27 NOTE — Progress Notes (Signed)
Patient called to update this RN regarding previous encounter with rectal discomfort and constipation.  Patient is currently now having one bowel movement daily, and is on Doxycycline.  No abd discomfort, rectal bleeding,   Patient reports symptoms with rectal discomfort and constipation started about 4 weeks post treatment.    RN encouraged patient to continue on antibiotic therapy, increase water intake, and encouraged additional ambulation.  Pt agreeable, and will follow up with RN later this week to review progress.

## 2023-01-29 ENCOUNTER — Other Ambulatory Visit: Payer: Self-pay | Admitting: Urology

## 2023-01-29 ENCOUNTER — Inpatient Hospital Stay: Payer: BC Managed Care – PPO

## 2023-01-29 ENCOUNTER — Inpatient Hospital Stay (HOSPITAL_BASED_OUTPATIENT_CLINIC_OR_DEPARTMENT_OTHER): Payer: BC Managed Care – PPO | Admitting: Internal Medicine

## 2023-01-29 ENCOUNTER — Other Ambulatory Visit: Payer: BC Managed Care – PPO

## 2023-01-29 ENCOUNTER — Other Ambulatory Visit: Payer: Self-pay

## 2023-01-29 VITALS — Wt 161.5 lb

## 2023-01-29 DIAGNOSIS — Z95828 Presence of other vascular implants and grafts: Secondary | ICD-10-CM

## 2023-01-29 DIAGNOSIS — D492 Neoplasm of unspecified behavior of bone, soft tissue, and skin: Secondary | ICD-10-CM

## 2023-01-29 DIAGNOSIS — C61 Malignant neoplasm of prostate: Secondary | ICD-10-CM | POA: Diagnosis not present

## 2023-01-29 DIAGNOSIS — R918 Other nonspecific abnormal finding of lung field: Secondary | ICD-10-CM

## 2023-01-29 LAB — TOTAL PROTEIN, URINE DIPSTICK: Protein, ur: NEGATIVE mg/dL

## 2023-01-29 LAB — CBC WITH DIFFERENTIAL (CANCER CENTER ONLY)
Abs Immature Granulocytes: 0.04 10*3/uL (ref 0.00–0.07)
Basophils Absolute: 0 10*3/uL (ref 0.0–0.1)
Basophils Relative: 0 %
Eosinophils Absolute: 0 10*3/uL (ref 0.0–0.5)
Eosinophils Relative: 0 %
HCT: 41.2 % (ref 39.0–52.0)
Hemoglobin: 13.6 g/dL (ref 13.0–17.0)
Immature Granulocytes: 1 %
Lymphocytes Relative: 5 %
Lymphs Abs: 0.4 10*3/uL — ABNORMAL LOW (ref 0.7–4.0)
MCH: 31.5 pg (ref 26.0–34.0)
MCHC: 33 g/dL (ref 30.0–36.0)
MCV: 95.4 fL (ref 80.0–100.0)
Monocytes Absolute: 0.6 10*3/uL (ref 0.1–1.0)
Monocytes Relative: 7 %
Neutro Abs: 7.4 10*3/uL (ref 1.7–7.7)
Neutrophils Relative %: 87 %
Platelet Count: 240 10*3/uL (ref 150–400)
RBC: 4.32 MIL/uL (ref 4.22–5.81)
RDW: 12.9 % (ref 11.5–15.5)
WBC Count: 8.4 10*3/uL (ref 4.0–10.5)
nRBC: 0 % (ref 0.0–0.2)

## 2023-01-29 MED ORDER — SODIUM CHLORIDE 0.9 % IV SOLN
5.0000 mg/kg | Freq: Once | INTRAVENOUS | Status: AC
  Administered 2023-01-29: 400 mg via INTRAVENOUS
  Filled 2023-01-29: qty 16

## 2023-01-29 MED ORDER — SODIUM CHLORIDE 0.9 % IV SOLN: Freq: Once | INTRAVENOUS | Status: AC

## 2023-01-29 MED ORDER — OXYCODONE-ACETAMINOPHEN 5-325 MG PO TABS: 1.0000 | ORAL_TABLET | Freq: Three times a day (TID) | ORAL | 0 refills | Status: DC | PRN

## 2023-01-29 MED ORDER — SODIUM CHLORIDE 0.9% FLUSH
10.0000 mL | Freq: Once | INTRAVENOUS | Status: AC
  Administered 2023-01-29: 10 mL

## 2023-01-29 MED ORDER — SODIUM CHLORIDE 0.9% FLUSH
10.0000 mL | INTRAVENOUS | Status: DC | PRN
  Administered 2023-01-29: 10 mL

## 2023-01-29 MED ORDER — HYDROCORTISONE ACETATE 25 MG RE SUPP: 25.0000 mg | Freq: Two times a day (BID) | RECTAL | 1 refills | Status: DC

## 2023-01-29 MED ORDER — HEPARIN SOD (PORK) LOCK FLUSH 100 UNIT/ML IV SOLN
500.0000 [IU] | Freq: Once | INTRAVENOUS | Status: AC | PRN
  Administered 2023-01-29: 500 [IU]

## 2023-01-29 NOTE — Progress Notes (Signed)
DIAGNOSIS:  62 y.o. gentleman with Stage T1c adenocarcinoma of the prostate with Gleason score of 4+3, and PSA of 10.2.   First Treatment Date: 2022-11-06 - Last Treatment Date: 2022-12-17   Plan Name: Prostate Site: Prostate Technique: IMRT Mode: Photon Dose Per Fraction: 2.5 Gy Prescribed Dose (Delivered / Prescribed): 70 Gy / 70 Gy Prescribed Fxs (Delivered / Prescribed): 28 / 28  Narrative: I called and spoke with Mathew [name] regarding his persistent rectal discomfort that began approximately 4 weeks following completion of his prostate radiation. He has noticed some mild, gradual improvement this week, since starting a short course of prednisone and Doxycycline prescribed by his PCP. He is also using Colace stool softeners BID and this has helped pretty much resolve the severe constipation that he has been dealing with. He has not seen any hematochezia and denies fever, chills, dysuria or malodorous urine. The rectal discomfort is described as a constant ache but is exacerbated with BMs, particularly when he was constipated. He will complete the course of Doxycycline and prednisone on Monday 02/02/23 and has a follow up appointment with Dr. Arita Miss at [facility] on 02/04/23.  Dr. Kathrynn Running and I have reviewed his daily cone beam imaging performed while he was getting radiation treatments daily and there was no evidence of rectal injury/performation. We also reviewed his recent CT C/A/P imaging report from [facility] 01/21/23 and this did not show any evidence of rectal injury. There were some air bubbles noted in the SpaceOAR gel itself which is common and non-worrisome. I shared this information with him and this did provide some peace-of-mid for him. 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 He was very appreciative for the call and information and knows that he is  welcome to call at any time with additional questions or concerns and I asked him to keep [place] apprised of his progress. We would expect a full recovery from the rectal discomfort over the next 4-6 weeks. 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  Marguarite Arbour, MMS, PA-C [place]  [facility] Radiation Oncology Physician Assistant Direct Dial: [phone]  Fax: [phone]

## 2023-01-29 NOTE — Progress Notes (Signed)
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  PRIOR THERAPY:  1) status post resection and the patient has recurrence and October 2021 status post resection again under the care of Dr. Dorris Fetch. 2) Sunitinib 3 7.5 mg p.o. daily.  First dose started April 08, 2021.  Status post 3 months of treatment.  This treatment was discontinued secondary to disease progression. 3) Votrient (pazopanib) 800 mg p.o. daily.  Started August 10, 2021.  Status post 2 months of treatment discontinued secondary to disease progression. 4) status post redo right thoracotomy with resection of pleural tumors and lymph node dissection under the care of Dr. Dorris Fetch on Nov 13, 2021. 5) Systemic chemotherapy with doxorubicin 25 Mg/M2, ifosfamide 2500 Mg/M2 and mesna 2500 Mg/M2 days 1-3 every 3 weeks.  First dose February 12, 2022 at [facility] cancer center.  Status post 2 cycles of treatment discontinued secondary to disease progression. 6) radiotherapy to the prostate cancer last fraction on 12/17/2022 that the care of Dr. Kathrynn Running   CURRENT THERAPY: He is expected to start treatment with systemic chemotherapy with Temodar 200 Mg/M2 on days 1-7 and day 15-21 with a Avastin 5 Mg/KG on days 8 and 22 every 4 weeks.  Status post 9 cycles  INTERVAL HISTORY: Mathew [name] 62 y.o. male returns to the clinic today for follow-up visit accompanied by his wife.  The patient continues to complain of fatigue and weakness as well as pain in the rectal area after his radiotherapy to the prostate cancer.  He was seen by his primary care physician and started on doxycycline and some  prednisone.  He is also using hemorrhoidal cream with minimal improvement.  He has no chest pain, shortness of breath, cough or hemoptysis.  He has no nausea, vomiting, diarrhea or constipation.  He has no headache or visual changes.  He was seen by Dr. Waymon Amato last week and repeat CT scan of the chest, abdomen and pelvis showed some evidence for disease progression.  Dr. Waymon Amato advised him to continue his current treatment for at least 6 more weeks before repeating imaging studies.  The patient also has an appointment with Dr. Salomon Fick at [facility] coming soon.  He is here for evaluation before starting day 8 of cycle #10.   MEDICAL HISTORY: Past Medical History:  Diagnosis Date   Anemia    Clubbing of nails    Deafness in left ear    Encounter for antineoplastic chemotherapy    History of kidney stones    Hypertension    Hypoglycemia due to neoplasm    Malignant neoplasm prostate [facility]) 07/2022   urologist--- dr pace;   bx 01/ 2024, Gleason 4+3   Malignant solitary fibrous neoplasm (HCC) 11/2015   oncologist--- @ [facility] Dr R. 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  MEDICATIONS:  Current Outpatient Medications  Medication Sig Dispense Refill   amLODipine (NORVASC) 10 MG tablet Take 10 mg by mouth daily. (Patient not taking: Reported on 11/06/2022)     bevacizumab-awwb (MVASI) 400 MG/16ML SOLN Inject 5 mg/kg into the vein once.     docusate sodium (COLACE) 100 MG capsule Take 1 capsule (100 mg total) by mouth every 12 (twelve)  hours. 60 capsule 0   temozolomide (TEMODAR) 100 MG capsule Take 100 mg by mouth at bedtime.     No current facility-administered medications for this visit.   Facility-Administered Medications Ordered in Other Visits  Medication Dose Route Frequency Provider Last Rate Last Admin   sodium phosphate (FLEET) 7-19 GM/118ML enema 1 enema  1 enema Rectal Once Noel Christmas, MD       sodium phosphate (FLEET) 7-19 GM/118ML enema 1 enema  1 enema Rectal Once Noel Christmas, MD        SURGICAL HISTORY:  Past Surgical History:  Procedure Laterality Date   GOLD SEED IMPLANT N/A 10/21/2022   Procedure: GOLD SEED IMPLANT;  Surgeon: Noel Christmas, MD;  Location: [facility];  Service: Urology;  Laterality: N/A;   INTERCOSTAL NERVE BLOCK Right 04/27/2020   Procedure: INTERCOSTAL NERVE BLOCK;  Surgeon: Loreli Slot, MD;  Location: [facility] OR;  Service: Thoracic;  Laterality: Right;   KNEE CARTILAGE SURGERY Left 1976   RESECTION OF MEDIASTINAL MASS Right 01/23/2016   @[facility]  by dr Dorris Fetch;   resection chest wall mass w/ en bloc wedge resection of RLL   RESECTION OF MEDIASTINAL MASS Right 11/13/2021   Procedure: RESECTION OF PLEURAL TUMORS;  Surgeon: Loreli Slot, MD;  Location: [facility] OR;  Service: Thoracic;  Laterality: Right;   SPACE OAR INSTILLATION N/A 10/21/2022   Procedure: SPACE OAR INSTILLATION;  Surgeon: Noel Christmas, MD;  Location: [facility];  Service: Urology;  Laterality: N/A;   THORACOTOMY Right 04/27/2020   Procedure: THORACOTOMY;  Surgeon: Loreli Slot, MD;  Location: [facility] OR;  Service: Thoracic;  Laterality: Right;   THORACOTOMY Right 11/13/2021   Procedure: REDO THORACOTOMY;  Surgeon: Loreli Slot, MD;  Location: [facility] OR;  Service: Thoracic;  Laterality: Right;   TONSILLECTOMY     child   VIDEO BRONCHOSCOPY WITH ENDOBRONCHIAL ULTRASOUND N/A 12/17/2015   Procedure: VIDEO BRONCHOSCOPY WITH ENDOBRONCHIAL ULTRASOUND;   Surgeon: Loreli Slot, MD;  Location: [facility] OR;  Service: Thoracic;  Laterality: N/A;    REVIEW OF SYSTEMS:  Constitutional: positive for fatigue Eyes: negative Ears, nose, mouth, throat, and face: negative Respiratory: negative Cardiovascular: negative Gastrointestinal: positive for constipation and rectal pain Genitourinary:negative Integument/breast: negative Hematologic/lymphatic: negative Musculoskeletal:negative Neurological: negative Behavioral/Psych: negative Endocrine: negative Allergic/Immunologic: negative   PHYSICAL EXAMINATION: General appearance: alert, cooperative, fatigued, and no distress Head: Normocephalic, without obvious abnormality, atraumatic Neck: no adenopathy, no JVD, supple, symmetrical, trachea midline, and thyroid not enlarged, symmetric, no tenderness/mass/nodules Lymph nodes: Cervical, supraclavicular, and axillary nodes normal. Resp: clear to auscultation bilaterally Back: symmetric, no curvature. ROM normal. No CVA tenderness. Cardio: regular rate and rhythm, S1, S2 normal, no murmur, click, rub or gallop GI: soft, non-tender; bowel sounds normal; no masses,  no organomegaly Extremities: extremities normal, atraumatic, no cyanosis or edema Neurologic: Alert and oriented X 3, normal strength  and tone. Normal symmetric reflexes. Normal coordination and gait  ECOG PERFORMANCE STATUS: 1 - Symptomatic but completely ambulatory  Blood pressure 121/72, pulse 71, temperature 98 F (36.7 C), temperature source Oral, resp. rate 18, SpO2 100%.  LABORATORY DATA: Lab Results  Component Value Date   WBC 7.0 01/15/2023   HGB 14.4 01/15/2023   HCT 43.2 01/15/2023   MCV 94.9 01/15/2023   PLT 200 01/15/2023      Chemistry      Component Value Date/Time   NA 137 01/15/2023 1045   K 4.1 01/15/2023 1045   CL 101 01/15/2023 1045   CO2 29 01/15/2023 1045   BUN 16 01/15/2023 1045   CREATININE 1.17 01/15/2023 1045      Component Value Date/Time    CALCIUM 9.4 01/15/2023 1045   ALKPHOS 82 01/15/2023 1045   AST 13 (L) 01/15/2023 1045   ALT 9 01/15/2023 1045   BILITOT 0.7 01/15/2023 1045       RADIOGRAPHIC STUDIES: No results found.  ASSESSMENT AND PLAN: This is a very pleasant 62 years old male with recurrent solitary fibrous tumor of the right lung diagnosed in June 2017 s/p resection followed by recurrence in October 2021 status post reresection under the care of Dr. Dorris Fetch. The patient has been on observation since his resection.  There was no role for adjuvant therapy for his condition.  He was seen by Dr. Lin Givens at [facility] who recommended for him observation and consideration of treatment with sunitinib followed by surgery if the patient has disease recurrence He had repeat CT scan of the chest performed recently.  The scan showed stable disease except for a new 1.4 cm nodular component posteriorly. The patient is currently undergoing treatment with sunitinib 37.5 mg p.o. daily status post 3 months of treatment.  The patient has been tolerating his treatment with Sutent fairly well except for the hypertension. Unfortunately has a scan showed evidence for disease progression with interval development of multiple new pleural-based nodules in the right hemithorax measuring up to 2.9 cm concerning for recurrent/metastatic disease. The patient was seen recently by Dr. Dorris Fetch who offered surgical resection by Dr. Lin Givens recommended neoadjuvant treatment with pazopanib for 8 weeks followed by surgical resection if the patient has a stable or improvement of his disease followed by adjuvant treatment with Pazopanib for another 6-8 months. He was treated with pazopanib 800 mg p.o. daily.  He is status post 2 months of treatment. Repeat CT scan showed clear evidence for disease progression with enlarging right pleural-based masses. The patient was referred to Dr. 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  I sent the tissue block to [facility] for molecular studies and PD-L1 expression.  Unfortunately the molecular studies showed no actionable mutations and his PD-L1 expression was negative. The patient was seen recently by Dr. Waymon Amato at [facility] cancer center and he received treatment with inpatient systemic chemotherapy with doxorubicin, ifosfamide and mesna every 3 weeks.  Status post 2 cycles.  He had fatigue and pancytopenia with this treatment. His treatment was discontinued secondary to disease progression. Dr. Waymon Amato recommended for the patient treatment with Temodar 200 Mg/M2 on days 1-7 and 15-21 in addition to Avastin 5 Mg/M2 on days 8 and 22 every 4 weeks.  Status post 9 cycles.  He was seen recently by Dr. Waymon Amato and repeat CT scan of the chest, abdomen and pelvis showed interval increase size of multiple pleural-based metastatic lesions in the right side of the chest with extension throughout the right chest wall. Dr. Waymon Amato recommended for him to continue his current treatment for at least 6 more weeks before repeating imaging studies. The patient is here today for evaluation before starting day 8 of cycle #10. I recommended for the patient to proceed with his treatment today as planned.  He has an appointment at [facility] with Dr. Salomon Fick in the next few weeks for reevaluation and third opinion about his condition. I will see him back for follow-up visit in 1 months for evaluation before starting day 8 of cycle #11. For the rectal pain I will give him prescription for Percocet to be used on as needed for pain management.  I will also reach out to Dr. Kathrynn Running to see if he has any other additional recommendation regarding this condition. He was advised to call  immediately if he has any other concerning symptoms in the interval. The patient voices understanding of current disease status and treatment options and is in agreement with the current care plan.  All questions were answered. The patient knows to call the clinic with any problems, questions or concerns. We can certainly see the patient much sooner if necessary.  The total time spent in the appointment was 30 minutes.  Disclaimer: This note was dictated with voice recognition software. Similar sounding words can inadvertently be transcribed and may not be corrected upon review.

## 2023-01-29 NOTE — Patient Instructions (Signed)
[place]  Discharge Instructions: Thank you for choosing [facility] to provide your oncology and hematology care.   If you have a lab appointment with the [facility], please go directly to the [facility] and check in at the registration area.   Wear comfortable clothing and clothing appropriate for easy access to any Portacath or PICC line.   We strive to give you quality time with your provider. You may need to reschedule your appointment if you arrive late (15 or more minutes).  Arriving late affects you and other patients whose appointments are after yours.  Also, if you miss three or more appointments without notifying the office, you may be dismissed from the clinic at the provider's discretion.      For prescription refill requests, have your pharmacy contact our office and allow 72 hours for refills to be completed.    Today you received the following chemotherapy and/or immunotherapy agents: bevacizumab-awwb      To help prevent nausea and vomiting after your treatment, we encourage you to take your nausea medication as directed.  BELOW ARE SYMPTOMS THAT SHOULD BE REPORTED IMMEDIATELY: *FEVER GREATER THAN 100.4 F (38 C) OR HIGHER *CHILLS OR SWEATING *NAUSEA AND VOMITING THAT IS NOT CONTROLLED WITH YOUR NAUSEA MEDICATION *UNUSUAL SHORTNESS OF BREATH *UNUSUAL BRUISING OR BLEEDING *URINARY PROBLEMS (pain or burning when urinating, or frequent urination) *BOWEL PROBLEMS (unusual diarrhea, constipation, pain near the anus) TENDERNESS IN MOUTH AND THROAT WITH OR WITHOUT PRESENCE OF ULCERS (sore throat, sores in mouth, or a toothache) UNUSUAL RASH, SWELLING OR PAIN  UNUSUAL VAGINAL DISCHARGE OR ITCHING   Items with * indicate a potential emergency and should be followed up as soon as possible or go to the Emergency Department if any problems should occur.  Please show the CHEMOTHERAPY ALERT CARD or IMMUNOTHERAPY ALERT CARD  at check-in to the Emergency Department and triage nurse.  Should you have questions after your visit or need to cancel or reschedule your appointment, please contact [facility]  Dept: [phone]  and follow the prompts.  Office hours are 8:00 a.m. to 4:30 p.m. Monday - Friday. Please note that voicemails left after 4:00 p.m. may not be returned until the following business day.  We are closed weekends and major holidays. You have access to a nurse at all times for urgent questions. Please call the main number to the clinic Dept: [phone] and follow the prompts.   For any non-urgent questions, you may also contact your provider using MyChart. We now offer e-Visits for anyone 87 and older to request care online for non-urgent symptoms. For details visit [email or web address].   Also download the MyChart app! Go to the app store, search "MyChart", open the app, select [facility], and log in with your MyChart username and password.

## 2023-02-05 ENCOUNTER — Telehealth: Payer: Self-pay | Admitting: *Deleted

## 2023-02-05 NOTE — Telephone Encounter (Signed)
Returned patient's wife's phone call, spoke with Ms. Lowell Guitar

## 2023-02-10 ENCOUNTER — Ambulatory Visit
Admission: RE | Admit: 2023-02-10 | Discharge: 2023-02-10 | Disposition: A | Discharge status: Home / Self Care | Payer: BC Managed Care – PPO | Source: Ambulatory Visit | Attending: Radiation Oncology | Admitting: Radiation Oncology

## 2023-02-10 NOTE — Progress Notes (Signed)
  Radiation Oncology         [phone]) [phone] ________________________________  Name: Mathew [name] MRN: [id number]  Date of Service: 02/10/2023  DOB: [birth year]  Post Treatment Telephone Note  Diagnosis:   62 y.o. gentleman with Stage T1c adenocarcinoma of the prostate with Gleason score of 4+3, and PSA of 10.2. (as documented in provider EOT note)   Pre Treatment IPSS Score: 2 (as documented in the provider consult note)  The patient was available for call today.   Symptoms of fatigue have not improved since completing therapy.  Symptoms of bladder changes have improved since completing therapy. Current symptoms include none, and medications for bladder symptoms include none.  Symptoms of bowel changes have improved since completing therapy. Current symptoms include mild, occasional diarrhea, and medications for bowel symptoms include colace daily.   Patient's current complaint is rectal pain 8/10, which is taking patient out of work. Dr. Arita Miss is aware.  Post Treatment IPSS Score: IPSS Questionnaire (AUA-7): Over the past month.   1)  How often have you had a sensation of not emptying your bladder completely after you finish urinating?  0 - Not at all  2)  How often have you had to urinate again less than two hours after you finished urinating? 2 - Less than half the time  3)  How often have you found you stopped and started again several times when you urinated?  0 - Not at all  4) How difficult have you found it to postpone urination?  0 - Not at all  5) How often have you had a weak urinary stream?  0 - Not at all  6) How often have you had to push or strain to begin urination?  1 - Less than 1 time in 5  7) How many times did you most typically get up to urinate from the time you went to bed until the time you got up in the morning?  3 - 3 times  Total score:  6. Which indicates mild symptoms  0-7 mildly symptomatic   8-19 moderately symptomatic   20-35 severely symptomatic     Patient has a scheduled follow up visit with his urologist, Dr. Arita Miss at [facility], on 04/2023 for ongoing surveillance. He was counseled that PSA levels will be drawn in the urology office, and was reassured that additional time is expected to improve bowel and bladder symptoms. He was encouraged to call back with concerns or questions regarding radiation.   This concludes the interaction.  Mathew Favors, LPN

## 2023-02-12 ENCOUNTER — Inpatient Hospital Stay: Payer: BC Managed Care – PPO

## 2023-02-12 ENCOUNTER — Other Ambulatory Visit (HOSPITAL_COMMUNITY): Payer: Self-pay | Admitting: Adult Health

## 2023-02-12 ENCOUNTER — Ambulatory Visit: Payer: BC Managed Care – PPO | Admitting: Physician Assistant

## 2023-02-12 VITALS — BP 122/73 | HR 63 | Temp 98.3°F | Resp 16 | Wt 162.0 lb

## 2023-02-12 DIAGNOSIS — K612 Anorectal abscess: Secondary | ICD-10-CM

## 2023-02-12 DIAGNOSIS — Z95828 Presence of other vascular implants and grafts: Secondary | ICD-10-CM

## 2023-02-12 DIAGNOSIS — C61 Malignant neoplasm of prostate: Secondary | ICD-10-CM | POA: Diagnosis not present

## 2023-02-12 DIAGNOSIS — D492 Neoplasm of unspecified behavior of bone, soft tissue, and skin: Secondary | ICD-10-CM

## 2023-02-12 DIAGNOSIS — R918 Other nonspecific abnormal finding of lung field: Secondary | ICD-10-CM

## 2023-02-12 LAB — CBC WITH DIFFERENTIAL (CANCER CENTER ONLY)
Abs Immature Granulocytes: 0.04 10*3/uL (ref 0.00–0.07)
Basophils Absolute: 0 10*3/uL (ref 0.0–0.1)
Basophils Relative: 0 %
Eosinophils Absolute: 0.2 10*3/uL (ref 0.0–0.5)
Eosinophils Relative: 2 %
HCT: 36.5 % — ABNORMAL LOW (ref 39.0–52.0)
Hemoglobin: 12.3 g/dL — ABNORMAL LOW (ref 13.0–17.0)
Immature Granulocytes: 1 %
Lymphocytes Relative: 7 %
Lymphs Abs: 0.5 10*3/uL — ABNORMAL LOW (ref 0.7–4.0)
MCH: 32.4 pg (ref 26.0–34.0)
MCHC: 33.7 g/dL (ref 30.0–36.0)
MCV: 96.1 fL (ref 80.0–100.0)
Monocytes Absolute: 0.7 10*3/uL (ref 0.1–1.0)
Monocytes Relative: 11 %
Neutro Abs: 5.5 10*3/uL (ref 1.7–7.7)
Neutrophils Relative %: 79 %
Platelet Count: 196 10*3/uL (ref 150–400)
RBC: 3.8 MIL/uL — ABNORMAL LOW (ref 4.22–5.81)
RDW: 13.2 % (ref 11.5–15.5)
WBC Count: 6.9 10*3/uL (ref 4.0–10.5)
nRBC: 0 % (ref 0.0–0.2)

## 2023-02-12 LAB — CMP (CANCER CENTER ONLY)
ALT: 12 U/L (ref 0–44)
AST: 16 U/L (ref 15–41)
Albumin: 3.4 g/dL — ABNORMAL LOW (ref 3.5–5.0)
Alkaline Phosphatase: 69 U/L (ref 38–126)
Anion gap: 5 (ref 5–15)
BUN: 17 mg/dL (ref 8–23)
CO2: 29 mmol/L (ref 22–32)
Calcium: 8.8 mg/dL — ABNORMAL LOW (ref 8.9–10.3)
Chloride: 102 mmol/L (ref 98–111)
Creatinine: 1.04 mg/dL (ref 0.61–1.24)
GFR, Estimated: >60 mL/min (ref >60)
Glucose, Bld: 91 mg/dL (ref 70–99)
Potassium: 4 mmol/L (ref 3.5–5.1)
Sodium: 136 mmol/L (ref 135–145)
Total Bilirubin: 0.5 mg/dL (ref 0.3–1.2)
Total Protein: 6.5 g/dL (ref 6.5–8.1)

## 2023-02-12 LAB — TOTAL PROTEIN, URINE DIPSTICK: Protein, ur: NEGATIVE mg/dL

## 2023-02-12 MED ORDER — SODIUM CHLORIDE 0.9% FLUSH
10.0000 mL | Freq: Once | INTRAVENOUS | Status: AC
  Administered 2023-02-12: 10 mL

## 2023-02-12 MED ORDER — SODIUM CHLORIDE 0.9 % IV SOLN
5.0000 mg/kg | Freq: Once | INTRAVENOUS | Status: AC
  Administered 2023-02-12: 400 mg via INTRAVENOUS
  Filled 2023-02-12: qty 16

## 2023-02-12 MED ORDER — SODIUM CHLORIDE 0.9% FLUSH
10.0000 mL | INTRAVENOUS | Status: DC | PRN
  Administered 2023-02-12: 10 mL

## 2023-02-12 MED ORDER — SODIUM CHLORIDE 0.9 % IV SOLN: Freq: Once | INTRAVENOUS | Status: AC

## 2023-02-12 MED ORDER — HEPARIN SOD (PORK) LOCK FLUSH 100 UNIT/ML IV SOLN
500.0000 [IU] | Freq: Once | INTRAVENOUS | Status: AC | PRN
  Administered 2023-02-12: 500 [IU]

## 2023-02-12 NOTE — Progress Notes (Signed)
Per Arbutus Ped, MD okay to proceed with Bevacizumab while on Cipro 500mg /Flagyl 500mg . Pt advised to hold Temodar while on abx and to notify MD if prescribed abx in the future. Pt verbalized understanding.

## 2023-02-12 NOTE — Patient Instructions (Signed)
[place]  Discharge Instructions: Thank you for choosing [facility] to provide your oncology and hematology care.   If you have a lab appointment with the [facility], please go directly to the [facility] and check in at the registration area.   Wear comfortable clothing and clothing appropriate for easy access to any Portacath or PICC line.   We strive to give you quality time with your provider. You may need to reschedule your appointment if you arrive late (15 or more minutes).  Arriving late affects you and other patients whose appointments are after yours.  Also, if you miss three or more appointments without notifying the office, you may be dismissed from the clinic at the provider's discretion.      For prescription refill requests, have your pharmacy contact our office and allow 72 hours for refills to be completed.    Today you received the following chemotherapy and/or immunotherapy agents: bevacizumab-awwb      To help prevent nausea and vomiting after your treatment, we encourage you to take your nausea medication as directed.  BELOW ARE SYMPTOMS THAT SHOULD BE REPORTED IMMEDIATELY: *FEVER GREATER THAN 100.4 F (38 C) OR HIGHER *CHILLS OR SWEATING *NAUSEA AND VOMITING THAT IS NOT CONTROLLED WITH YOUR NAUSEA MEDICATION *UNUSUAL SHORTNESS OF BREATH *UNUSUAL BRUISING OR BLEEDING *URINARY PROBLEMS (pain or burning when urinating, or frequent urination) *BOWEL PROBLEMS (unusual diarrhea, constipation, pain near the anus) TENDERNESS IN MOUTH AND THROAT WITH OR WITHOUT PRESENCE OF ULCERS (sore throat, sores in mouth, or a toothache) UNUSUAL RASH, SWELLING OR PAIN  UNUSUAL VAGINAL DISCHARGE OR ITCHING   Items with * indicate a potential emergency and should be followed up as soon as possible or go to the Emergency Department if any problems should occur.  Please show the CHEMOTHERAPY ALERT CARD or IMMUNOTHERAPY ALERT CARD  at check-in to the Emergency Department and triage nurse.  Should you have questions after your visit or need to cancel or reschedule your appointment, please contact [facility]  Dept: [phone]  and follow the prompts.  Office hours are 8:00 a.m. to 4:30 p.m. Monday - Friday. Please note that voicemails left after 4:00 p.m. may not be returned until the following business day.  We are closed weekends and major holidays. You have access to a nurse at all times for urgent questions. Please call the main number to the clinic Dept: [phone] and follow the prompts.   For any non-urgent questions, you may also contact your provider using MyChart. We now offer e-Visits for anyone 87 and older to request care online for non-urgent symptoms. For details visit [email or web address].   Also download the MyChart app! Go to the app store, search "MyChart", open the app, select [facility], and log in with your MyChart username and password.

## 2023-02-13 ENCOUNTER — Other Ambulatory Visit: Payer: Self-pay

## 2023-02-18 ENCOUNTER — Encounter: Payer: Self-pay | Admitting: Internal Medicine

## 2023-02-19 ENCOUNTER — Telehealth: Payer: Self-pay | Admitting: Pharmacist

## 2023-02-19 NOTE — Telephone Encounter (Signed)
Oral Chemotherapy Pharmacist Encounter   Received forwarded voicemail from patient's wife, Zaniel Kosar, questioning if there is any issue taking ciprofloxacin and temozolomide concomitantly.   Called and spoke with patient's wife, Aurther Loft, and let her know that there are no issue with interaction between the two and OK for patient to finish his ciprofloxacin course and start his next round of temozolomide tonight (02/19/23).  Patient's wife expressed appreciation and understanding - no further questions or concerns at this time.   Lenord Carbo, PharmD, BCPS, BCOP Hematology/Oncology Clinical Pharmacist [facility] Oral Chemotherapy [facility] Clin[facility] [phone] 02/19/2023 1:44 PM

## 2023-02-20 ENCOUNTER — Ambulatory Visit (HOSPITAL_COMMUNITY): Admission: RE | Admit: 2023-02-20 | Payer: BC Managed Care – PPO | Source: Ambulatory Visit

## 2023-02-20 ENCOUNTER — Encounter (HOSPITAL_COMMUNITY): Payer: Self-pay

## 2023-02-23 ENCOUNTER — Other Ambulatory Visit: Payer: Self-pay | Admitting: Adult Health

## 2023-02-23 ENCOUNTER — Other Ambulatory Visit: Payer: Self-pay

## 2023-02-23 DIAGNOSIS — K612 Anorectal abscess: Secondary | ICD-10-CM

## 2023-02-26 ENCOUNTER — Inpatient Hospital Stay: Payer: BC Managed Care – PPO

## 2023-02-26 ENCOUNTER — Telehealth: Payer: Self-pay | Admitting: Radiation Oncology

## 2023-02-26 ENCOUNTER — Inpatient Hospital Stay: Payer: BC Managed Care – PPO | Attending: Internal Medicine

## 2023-02-26 ENCOUNTER — Inpatient Hospital Stay (HOSPITAL_BASED_OUTPATIENT_CLINIC_OR_DEPARTMENT_OTHER): Payer: BC Managed Care – PPO | Admitting: Internal Medicine

## 2023-02-26 VITALS — BP 138/95 | HR 88 | Temp 98.2°F | Resp 18 | Ht 69.0 in | Wt 156.0 lb

## 2023-02-26 VITALS — BP 140/91 | HR 85

## 2023-02-26 DIAGNOSIS — R5383 Other fatigue: Secondary | ICD-10-CM | POA: Diagnosis not present

## 2023-02-26 DIAGNOSIS — D492 Neoplasm of unspecified behavior of bone, soft tissue, and skin: Secondary | ICD-10-CM

## 2023-02-26 DIAGNOSIS — M1990 Unspecified osteoarthritis, unspecified site: Secondary | ICD-10-CM | POA: Insufficient documentation

## 2023-02-26 DIAGNOSIS — Z79899 Other long term (current) drug therapy: Secondary | ICD-10-CM | POA: Insufficient documentation

## 2023-02-26 DIAGNOSIS — Z87442 Personal history of urinary calculi: Secondary | ICD-10-CM | POA: Insufficient documentation

## 2023-02-26 DIAGNOSIS — I10 Essential (primary) hypertension: Secondary | ICD-10-CM | POA: Diagnosis not present

## 2023-02-26 DIAGNOSIS — Z923 Personal history of irradiation: Secondary | ICD-10-CM | POA: Insufficient documentation

## 2023-02-26 DIAGNOSIS — Z95828 Presence of other vascular implants and grafts: Secondary | ICD-10-CM

## 2023-02-26 DIAGNOSIS — R21 Rash and other nonspecific skin eruption: Secondary | ICD-10-CM | POA: Diagnosis not present

## 2023-02-26 DIAGNOSIS — C61 Malignant neoplasm of prostate: Secondary | ICD-10-CM | POA: Insufficient documentation

## 2023-02-26 DIAGNOSIS — G893 Neoplasm related pain (acute) (chronic): Secondary | ICD-10-CM | POA: Diagnosis not present

## 2023-02-26 DIAGNOSIS — R918 Other nonspecific abnormal finding of lung field: Secondary | ICD-10-CM | POA: Diagnosis not present

## 2023-02-26 DIAGNOSIS — Z5112 Encounter for antineoplastic immunotherapy: Secondary | ICD-10-CM | POA: Diagnosis not present

## 2023-02-26 DIAGNOSIS — Z9221 Personal history of antineoplastic chemotherapy: Secondary | ICD-10-CM | POA: Diagnosis not present

## 2023-02-26 DIAGNOSIS — D61818 Other pancytopenia: Secondary | ICD-10-CM | POA: Insufficient documentation

## 2023-02-26 LAB — CMP (CANCER CENTER ONLY)
ALT: 12 U/L (ref 0–44)
AST: 15 U/L (ref 15–41)
Albumin: 3.6 g/dL (ref 3.5–5.0)
Alkaline Phosphatase: 67 U/L (ref 38–126)
Anion gap: 5 (ref 5–15)
BUN: 17 mg/dL (ref 8–23)
CO2: 30 mmol/L (ref 22–32)
Calcium: 9.3 mg/dL (ref 8.9–10.3)
Chloride: 104 mmol/L (ref 98–111)
Creatinine: 1.03 mg/dL (ref 0.61–1.24)
GFR, Estimated: >60 mL/min (ref >60)
Glucose, Bld: 116 mg/dL — ABNORMAL HIGH (ref 70–99)
Potassium: 3.4 mmol/L — ABNORMAL LOW (ref 3.5–5.1)
Sodium: 139 mmol/L (ref 135–145)
Total Bilirubin: 0.7 mg/dL (ref 0.3–1.2)
Total Protein: 6.9 g/dL (ref 6.5–8.1)

## 2023-02-26 LAB — CBC WITH DIFFERENTIAL (CANCER CENTER ONLY)
Abs Immature Granulocytes: 0.04 10*3/uL (ref 0.00–0.07)
Basophils Absolute: 0 10*3/uL (ref 0.0–0.1)
Basophils Relative: 0 %
Eosinophils Absolute: 0.1 10*3/uL (ref 0.0–0.5)
Eosinophils Relative: 2 %
HCT: 40.3 % (ref 39.0–52.0)
Hemoglobin: 12.9 g/dL — ABNORMAL LOW (ref 13.0–17.0)
Immature Granulocytes: 1 %
Lymphocytes Relative: 6 %
Lymphs Abs: 0.5 10*3/uL — ABNORMAL LOW (ref 0.7–4.0)
MCH: 31.2 pg (ref 26.0–34.0)
MCHC: 32 g/dL (ref 30.0–36.0)
MCV: 97.6 fL (ref 80.0–100.0)
Monocytes Absolute: 0.8 10*3/uL (ref 0.1–1.0)
Monocytes Relative: 10 %
Neutro Abs: 6.8 10*3/uL (ref 1.7–7.7)
Neutrophils Relative %: 81 %
Platelet Count: 279 10*3/uL (ref 150–400)
RBC: 4.13 MIL/uL — ABNORMAL LOW (ref 4.22–5.81)
RDW: 13.4 % (ref 11.5–15.5)
WBC Count: 8.3 10*3/uL (ref 4.0–10.5)
nRBC: 0 % (ref 0.0–0.2)

## 2023-02-26 LAB — TOTAL PROTEIN, URINE DIPSTICK: Protein, ur: 30 mg/dL — AB

## 2023-02-26 MED ORDER — SODIUM CHLORIDE 0.9 % IV SOLN: Freq: Once | INTRAVENOUS | Status: AC

## 2023-02-26 MED ORDER — HEPARIN SOD (PORK) LOCK FLUSH 100 UNIT/ML IV SOLN
500.0000 [IU] | Freq: Once | INTRAVENOUS | Status: AC | PRN
  Administered 2023-02-26: 500 [IU]

## 2023-02-26 MED ORDER — SODIUM CHLORIDE 0.9% FLUSH
10.0000 mL | INTRAVENOUS | Status: DC | PRN
  Administered 2023-02-26: 10 mL

## 2023-02-26 MED ORDER — SODIUM CHLORIDE 0.9% FLUSH
10.0000 mL | Freq: Once | INTRAVENOUS | Status: AC
  Administered 2023-02-26: 10 mL

## 2023-02-26 MED ORDER — SODIUM CHLORIDE 0.9 % IV SOLN
5.0000 mg/kg | Freq: Once | INTRAVENOUS | Status: AC
  Administered 2023-02-26: 400 mg via INTRAVENOUS
  Filled 2023-02-26: qty 16

## 2023-02-26 NOTE — Progress Notes (Signed)
[facility] [phone]   [phone]  OFFICE PROGRESS NOTE  Mathew Devon, MD [address] 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  PRIOR THERAPY:  1) status post resection and the patient has recurrence and October 2021 status post resection again under the care of Dr. Dorris Fetch. 2) Sunitinib 3 7.5 mg p.o. daily.  First dose started April 08, 2021.  Status post 3 months of treatment.  This treatment was discontinued secondary to disease progression. 3) Votrient (pazopanib) 800 mg p.o. daily.  Started August 10, 2021.  Status post 2 months of treatment discontinued secondary to disease progression. 4) status post redo right thoracotomy with resection of pleural tumors and lymph node dissection under the care of Dr. Dorris Fetch on Nov 13, 2021. 5) Systemic chemotherapy with doxorubicin 25 Mg/M2, ifosfamide 2500 Mg/M2 and mesna 2500 Mg/M2 days 1-3 every 3 weeks.  First dose February 12, 2022 at [facility] cancer center.  Status post 2 cycles of treatment discontinued secondary to disease progression. 6) radiotherapy to the prostate cancer last fraction on 12/17/2022 that the care of Dr. Kathrynn Running   CURRENT THERAPY: He is expected to start treatment with systemic chemotherapy with Temodar 200 Mg/M2 on days 1-7 and day 15-21 with a Avastin 5 Mg/KG on days 8 and 22 every 4 weeks.  Status post 10 cycles  INTERVAL HISTORY: Mathew [name] 62 y.o. male returns to the clinic today for follow-up visit.  The patient continues to have pain in the pelvic area after his radiotherapy to the prostate.  He was seen by Dr. Kathrynn Running as well as Dr. Arita Miss from urology.  He was treated with a course of antibiotics with no improvement.  His primary care physician  started him on prednisone few days ago.  He denied having any current chest pain, shortness of breath, cough or hemoptysis.  He has no nausea, vomiting, diarrhea or constipation.  He has no headache or visual changes.  He continues to tolerate his treatment with Temodar and Avastin fairly well.  He is here for evaluation before starting cycle #11.  MEDICAL HISTORY: Past Medical History:  Diagnosis Date   Anemia    Clubbing of nails    Deafness in left ear    Encounter for antineoplastic chemotherapy    History of kidney stones    Hypertension    Hypoglycemia due to neoplasm    Malignant neoplasm prostate [facility]) 07/2022   urologist--- dr pace;   bx 01/ 2024, Gleason 4+3   Malignant solitary fibrous neoplasm (HCC) 11/2015   oncologist--- @ [facility] Dr R. 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  MEDICATIONS:  Current Outpatient Medications  Medication Sig Dispense Refill  amLODipine (NORVASC) 10 MG tablet Take 10 mg by mouth daily. (Patient not taking: Reported on 11/06/2022)     bevacizumab-awwb (MVASI) 400 MG/16ML SOLN Inject 5 mg/kg into the vein once.     ciprofloxacin (CIPRO) 500 MG tablet Take 500 mg by mouth 2 (two) times daily. urology     docusate sodium (COLACE) 100 MG capsule Take 1 capsule (100 mg total) by mouth every 12 (twelve) hours. 60 capsule 0   hydrocortisone (ANUSOL-HC) 25 MG suppository Place 1 suppository (25 mg total) rectally 2 (two) times daily. 24 suppository 1   metroNIDAZOLE (FLAGYL) 500 MG tablet Take 500 mg by  mouth 3 (three) times daily. Urology - 7 day prescription started 8/27     oxyCODONE-acetaminophen (PERCOCET/ROXICET) 5-325 MG tablet Take 1 tablet by mouth every 8 (eight) hours as needed for severe pain. 20 tablet 0   tamsulosin (FLOMAX) 0.4 MG CAPS capsule Take 0.4 mg by mouth daily. Urology started 8/27     temozolomide (TEMODAR) 100 MG capsule Take 100 mg by mouth at bedtime.     No current facility-administered medications for this visit.   Facility-Administered Medications Ordered in Other Visits  Medication Dose Route Frequency Provider Last Rate Last Admin   sodium phosphate (FLEET) 7-19 GM/118ML enema 1 enema  1 enema Rectal Once Noel Christmas, MD       sodium phosphate (FLEET) 7-19 GM/118ML enema 1 enema  1 enema Rectal Once Noel Christmas, MD        SURGICAL HISTORY:  Past Surgical History:  Procedure Laterality Date   GOLD SEED IMPLANT N/A 10/21/2022   Procedure: GOLD SEED IMPLANT;  Surgeon: Noel Christmas, MD;  Location: [facility];  Service: Urology;  Laterality: N/A;   INTERCOSTAL NERVE BLOCK Right 04/27/2020   Procedure: INTERCOSTAL NERVE BLOCK;  Surgeon: Loreli Slot, MD;  Location: [facility] OR;  Service: Thoracic;  Laterality: Right;   KNEE CARTILAGE SURGERY Left 1976   RESECTION OF MEDIASTINAL MASS Right 01/23/2016   @[facility]  by dr Dorris Fetch;   resection chest wall mass w/ en bloc wedge resection of RLL   RESECTION OF MEDIASTINAL MASS Right 11/13/2021   Procedure: RESECTION OF PLEURAL TUMORS;  Surgeon: Loreli Slot, MD;  Location: [facility] OR;  Service: Thoracic;  Laterality: Right;   SPACE OAR INSTILLATION N/A 10/21/2022   Procedure: SPACE OAR INSTILLATION;  Surgeon: Noel Christmas, MD;  Location: [facility];  Service: Urology;  Laterality: N/A;   THORACOTOMY Right 04/27/2020   Procedure: THORACOTOMY;  Surgeon: Loreli Slot, MD;  Location: [facility] OR;  Service: Thoracic;  Laterality: Right;   THORACOTOMY Right  11/13/2021   Procedure: REDO THORACOTOMY;  Surgeon: Loreli Slot, MD;  Location: [facility] OR;  Service: Thoracic;  Laterality: Right;   TONSILLECTOMY     child   VIDEO BRONCHOSCOPY WITH ENDOBRONCHIAL ULTRASOUND N/A 12/17/2015   Procedure: VIDEO BRONCHOSCOPY WITH ENDOBRONCHIAL ULTRASOUND;  Surgeon: Loreli Slot, MD;  Location: [facility] OR;  Service: Thoracic;  Laterality: N/A;    REVIEW OF SYSTEMS:  A comprehensive review of systems was negative except for: Constitutional: positive for fatigue Genitourinary: positive for pelvic pain and dysuria    PHYSICAL EXAMINATION: General appearance: alert, cooperative, fatigued, and no distress Head: Normocephalic, without obvious abnormality, atraumatic Neck: no adenopathy, no JVD, supple, symmetrical, trachea midline, and thyroid not enlarged, symmetric, no tenderness/mass/nodules Lymph nodes: Cervical, supraclavicular, and axillary nodes normal. Resp: clear to auscultation bilaterally Back: symmetric, no curvature. ROM normal. No  CVA tenderness. Cardio: regular rate and rhythm, S1, S2 normal, no murmur, click, rub or gallop GI: soft, non-tender; bowel sounds normal; no masses,  no organomegaly Extremities: extremities normal, atraumatic, no cyanosis or edema  ECOG PERFORMANCE STATUS: 1 - Symptomatic but completely ambulatory  Blood pressure (!) 138/95, pulse 88, temperature 98.2 F (36.8 C), temperature source Oral, resp. rate 18, height 5\' 9"  (1.753 m), weight 156 lb (70.8 kg), SpO2 99%.  LABORATORY DATA: Lab Results  Component Value Date   WBC 8.3 02/26/2023   HGB 12.9 (L) 02/26/2023   HCT 40.3 02/26/2023   MCV 97.6 02/26/2023   PLT 279 02/26/2023      Chemistry      Component Value Date/Time   NA 136 02/12/2023 1047   K 4.0 02/12/2023 1047   CL 102 02/12/2023 1047   CO2 29 02/12/2023 1047   BUN 17 02/12/2023 1047   CREATININE 1.04 02/12/2023 1047      Component Value Date/Time   CALCIUM 8.8 (L) 02/12/2023 1047   ALKPHOS  69 02/12/2023 1047   AST 16 02/12/2023 1047   ALT 12 02/12/2023 1047   BILITOT 0.5 02/12/2023 1047       RADIOGRAPHIC STUDIES: No results found.  ASSESSMENT AND PLAN: This is a very pleasant 62 years old male with recurrent solitary fibrous tumor of the right lung diagnosed in June 2017 s/p resection followed by recurrence in October 2021 status post reresection under the care of Dr. Dorris Fetch. The patient has been on observation since his resection.  There was no role for adjuvant therapy for his condition.  He was seen by Dr. Lin Givens at [facility] who recommended for him observation and consideration of treatment with sunitinib followed by surgery if the patient has disease recurrence He had repeat CT scan of the chest performed recently.  The scan showed stable disease except for a new 1.4 cm nodular component posteriorly. The patient is currently undergoing treatment with sunitinib 37.5 mg p.o. daily status post 3 months of treatment.  The patient has been tolerating his treatment with Sutent fairly well except for the hypertension. Unfortunately has a scan showed evidence for disease progression with interval development of multiple new pleural-based nodules in the right hemithorax measuring up to 2.9 cm concerning for recurrent/metastatic disease. The patient was seen recently by Dr. Dorris Fetch who offered surgical resection by Dr. Lin Givens recommended neoadjuvant treatment with pazopanib for 8 weeks followed by surgical resection if the patient has a stable or improvement of his disease followed by adjuvant treatment with Pazopanib for another 6-8 months. He was treated with pazopanib 800 mg p.o. daily.  He is status post 2 months of treatment. Repeat CT scan showed clear evidence for disease progression with enlarging right pleural-based masses. The patient was referred to Dr. 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  I sent the tissue block to [facility] for molecular studies and PD-L1 expression.  Unfortunately the molecular studies showed no actionable mutations and his PD-L1 expression was negative. The patient was seen recently by Dr. Waymon Amato at [facility] cancer center and he received treatment with inpatient systemic chemotherapy with doxorubicin, ifosfamide and mesna every 3 weeks.  Status post 2 cycles.  He had fatigue and pancytopenia with this treatment. His treatment was discontinued secondary to  disease progression. Dr. Waymon Amato recommended for the patient treatment with Temodar 200 Mg/M2 on days 1-7 and 15-21 in addition to Avastin 5 Mg/M2 on days 8 and 22 every 4 weeks.  Status post 10 cycles.   He has been tolerating his treatment well with no concerning adverse effect except for the persistent pelvic pain after the prostate radiotherapy. He is scheduled to see gastroenterology for evaluation soon.  He was also started by his primary care physician on prednisone 2 days ago.  He did not notice any improvement yet. I recommended for the patient to see the palliative care team for additional recommendation regarding his pelvic pain issues. He will proceed with cycle #11 today as planned. I will see him back for follow-up visit in 4 weeks for evaluation before starting day 8 of cycle #12. He was advised to call immediately if he has any other concerning symptoms in the interval. The patient voices understanding of current disease status and treatment options and is in agreement with the current care plan.  All questions were answered. The patient knows to call the clinic with any problems, questions or concerns. We can certainly see the patient much sooner if necessary.  The total time spent in the appointment was 20  minutes.  Disclaimer: This note was dictated with voice recognition software. Similar sounding words can inadvertently be transcribed and may not be corrected upon review.

## 2023-02-26 NOTE — Patient Instructions (Signed)
[facility]  Discharge Instructions: Thank you for choosing [facility] to provide your oncology and hematology care.   If you have a lab appointment with the [facility], please go directly to the [facility] and check in at the registration area.   Wear comfortable clothing and clothing appropriate for easy access to any Portacath or PICC line.   We strive to give you quality time with your provider. You may need to reschedule your appointment if you arrive late (15 or more minutes).  Arriving late affects you and other patients whose appointments are after yours.  Also, if you miss three or more appointments without notifying the office, you may be dismissed from the clinic at the provider's discretion.      For prescription refill requests, have your pharmacy contact our office and allow 72 hours for refills to be completed.    Today you received the following chemotherapy and/or immunotherapy agents: Bevacizumab      To help prevent nausea and vomiting after your treatment, we encourage you to take your nausea medication as directed.  BELOW ARE SYMPTOMS THAT SHOULD BE REPORTED IMMEDIATELY: *FEVER GREATER THAN 100.4 F (38 C) OR HIGHER *CHILLS OR SWEATING *NAUSEA AND VOMITING THAT IS NOT CONTROLLED WITH YOUR NAUSEA MEDICATION *UNUSUAL SHORTNESS OF BREATH *UNUSUAL BRUISING OR BLEEDING *URINARY PROBLEMS (pain or burning when urinating, or frequent urination) *BOWEL PROBLEMS (unusual diarrhea, constipation, pain near the anus) TENDERNESS IN MOUTH AND THROAT WITH OR WITHOUT PRESENCE OF ULCERS (sore throat, sores in mouth, or a toothache) UNUSUAL RASH, SWELLING OR PAIN  UNUSUAL VAGINAL DISCHARGE OR ITCHING   Items with * indicate a potential emergency and should be followed up as soon as possible or go to the Emergency Department if any problems should occur.  Please show the CHEMOTHERAPY ALERT CARD or IMMUNOTHERAPY ALERT CARD at  check-in to the Emergency Department and triage nurse.  Should you have questions after your visit or need to cancel or reschedule your appointment, please contact [facility]  Dept: [phone]  and follow the prompts.  Office hours are 8:00 a.m. to 4:30 p.m. Monday - Friday. Please note that voicemails left after 4:00 p.m. may not be returned until the following business day.  We are closed weekends and major holidays. You have access to a nurse at all times for urgent questions. Please call the main number to the clinic Dept: [phone] and follow the prompts.   For any non-urgent questions, you may also contact your provider using MyChart. We now offer e-Visits for anyone 82 and older to request care online for non-urgent symptoms. For details visit [email or web address].   Also download the MyChart app! Go to the app store, search "MyChart", open the app, select [facility], and log in with your MyChart username and password.

## 2023-02-26 NOTE — Telephone Encounter (Signed)
Called patient to get further information on upcoming appointments. No answer, LVM for a return call.

## 2023-02-26 NOTE — Progress Notes (Signed)
Patient calculated dose of bevacizumab is ~389.4 so slightly outside of 10%. Will continue 400 mg of bevacizumab for now and will reassess prior to the next dose. Dose discrepancy secondary to weigh slight weight loss since last cycle.  Demetrius Charity, PharmD

## 2023-02-27 ENCOUNTER — Encounter: Payer: Self-pay | Admitting: Adult Health

## 2023-02-27 ENCOUNTER — Other Ambulatory Visit: Payer: Self-pay

## 2023-02-27 ENCOUNTER — Telehealth: Payer: Self-pay | Admitting: Internal Medicine

## 2023-02-27 ENCOUNTER — Inpatient Hospital Stay
Admission: RE | Admit: 2023-02-27 | Discharge: 2023-02-27 | Disposition: A | Discharge status: Home / Self Care | Payer: Self-pay | Source: Ambulatory Visit | Attending: Radiation Oncology | Admitting: Radiation Oncology

## 2023-02-27 DIAGNOSIS — C61 Malignant neoplasm of prostate: Secondary | ICD-10-CM

## 2023-02-27 NOTE — Progress Notes (Signed)
RN spoke with patient and he is aware that he will be contacted by scheduling to have a visit with Dr. Kathrynn Running.   RN requested recent CT scan images from [facility] to be power shared.

## 2023-02-27 NOTE — Telephone Encounter (Signed)
Scheduled appointment per referral message. Patient is aware of the made appointment.

## 2023-02-28 ENCOUNTER — Ambulatory Visit
Admission: RE | Admit: 2023-02-28 | Discharge: 2023-02-28 | Disposition: A | Discharge status: Home / Self Care | Payer: BC Managed Care – PPO | Source: Ambulatory Visit | Attending: Adult Health | Admitting: Adult Health

## 2023-02-28 DIAGNOSIS — K612 Anorectal abscess: Secondary | ICD-10-CM

## 2023-02-28 MED ORDER — GADOPICLENOL 0.5 MMOL/ML IV SOLN
7.0000 mL | Freq: Once | INTRAVENOUS | Status: AC | PRN
  Administered 2023-02-28: 7 mL via INTRAVENOUS

## 2023-03-02 ENCOUNTER — Other Ambulatory Visit: Payer: Self-pay

## 2023-03-02 ENCOUNTER — Inpatient Hospital Stay
Admission: RE | Admit: 2023-03-02 | Discharge: 2023-03-02 | Disposition: A | Discharge status: Home / Self Care | Payer: Self-pay | Source: Ambulatory Visit | Attending: Radiation Oncology | Admitting: Radiation Oncology

## 2023-03-02 DIAGNOSIS — C61 Malignant neoplasm of prostate: Secondary | ICD-10-CM

## 2023-03-04 ENCOUNTER — Other Ambulatory Visit: Payer: Self-pay

## 2023-03-05 ENCOUNTER — Other Ambulatory Visit: Payer: Self-pay

## 2023-03-05 ENCOUNTER — Encounter: Payer: Self-pay | Admitting: Physician Assistant

## 2023-03-05 ENCOUNTER — Inpatient Hospital Stay (HOSPITAL_BASED_OUTPATIENT_CLINIC_OR_DEPARTMENT_OTHER)
Admission: EM | Admit: 2023-03-05 | Discharge: 2023-03-09 | DRG: 394 | Disposition: A | Discharge status: Home / Self Care | Payer: BC Managed Care – PPO | Attending: Internal Medicine | Admitting: Internal Medicine

## 2023-03-05 ENCOUNTER — Emergency Department (HOSPITAL_BASED_OUTPATIENT_CLINIC_OR_DEPARTMENT_OTHER): Payer: BC Managed Care – PPO

## 2023-03-05 ENCOUNTER — Encounter: Payer: Self-pay | Admitting: Radiation Oncology

## 2023-03-05 ENCOUNTER — Encounter: Payer: Self-pay | Admitting: Internal Medicine

## 2023-03-05 ENCOUNTER — Other Ambulatory Visit (HOSPITAL_BASED_OUTPATIENT_CLINIC_OR_DEPARTMENT_OTHER): Payer: Self-pay

## 2023-03-05 ENCOUNTER — Encounter (HOSPITAL_BASED_OUTPATIENT_CLINIC_OR_DEPARTMENT_OTHER): Payer: Self-pay

## 2023-03-05 DIAGNOSIS — H9192 Unspecified hearing loss, left ear: Secondary | ICD-10-CM | POA: Diagnosis present

## 2023-03-05 DIAGNOSIS — Z87891 Personal history of nicotine dependence: Secondary | ICD-10-CM | POA: Diagnosis not present

## 2023-03-05 DIAGNOSIS — Z7952 Long term (current) use of systemic steroids: Secondary | ICD-10-CM | POA: Diagnosis not present

## 2023-03-05 DIAGNOSIS — Z79899 Other long term (current) drug therapy: Secondary | ICD-10-CM

## 2023-03-05 DIAGNOSIS — Z974 Presence of external hearing-aid: Secondary | ICD-10-CM | POA: Diagnosis not present

## 2023-03-05 DIAGNOSIS — I10 Essential (primary) hypertension: Secondary | ICD-10-CM | POA: Diagnosis present

## 2023-03-05 DIAGNOSIS — Z7969 Long term (current) use of other immunomodulators and immunosuppressants: Secondary | ICD-10-CM

## 2023-03-05 DIAGNOSIS — C782 Secondary malignant neoplasm of pleura: Secondary | ICD-10-CM | POA: Diagnosis present

## 2023-03-05 DIAGNOSIS — K605 Anorectal fistula: Principal | ICD-10-CM | POA: Diagnosis present

## 2023-03-05 DIAGNOSIS — K5900 Constipation, unspecified: Secondary | ICD-10-CM | POA: Diagnosis present

## 2023-03-05 DIAGNOSIS — Z8546 Personal history of malignant neoplasm of prostate: Secondary | ICD-10-CM | POA: Diagnosis not present

## 2023-03-05 DIAGNOSIS — C3431 Malignant neoplasm of lower lobe, right bronchus or lung: Secondary | ICD-10-CM | POA: Diagnosis present

## 2023-03-05 DIAGNOSIS — K6289 Other specified diseases of anus and rectum: Secondary | ICD-10-CM | POA: Diagnosis present

## 2023-03-05 DIAGNOSIS — G629 Polyneuropathy, unspecified: Secondary | ICD-10-CM | POA: Diagnosis present

## 2023-03-05 DIAGNOSIS — M1990 Unspecified osteoarthritis, unspecified site: Secondary | ICD-10-CM | POA: Diagnosis present

## 2023-03-05 DIAGNOSIS — E559 Vitamin D deficiency, unspecified: Secondary | ICD-10-CM | POA: Diagnosis present

## 2023-03-05 DIAGNOSIS — D630 Anemia in neoplastic disease: Secondary | ICD-10-CM | POA: Diagnosis present

## 2023-03-05 DIAGNOSIS — Z880 Allergy status to penicillin: Secondary | ICD-10-CM | POA: Diagnosis not present

## 2023-03-05 DIAGNOSIS — Z8249 Family history of ischemic heart disease and other diseases of the circulatory system: Secondary | ICD-10-CM | POA: Diagnosis not present

## 2023-03-05 DIAGNOSIS — Z87442 Personal history of urinary calculi: Secondary | ICD-10-CM | POA: Diagnosis not present

## 2023-03-05 DIAGNOSIS — Z886 Allergy status to analgesic agent status: Secondary | ICD-10-CM | POA: Diagnosis not present

## 2023-03-05 DIAGNOSIS — Z923 Personal history of irradiation: Secondary | ICD-10-CM

## 2023-03-05 LAB — COMPREHENSIVE METABOLIC PANEL
ALT: 11 U/L (ref 0–44)
AST: 14 U/L — ABNORMAL LOW (ref 15–41)
Albumin: 3.7 g/dL (ref 3.5–5.0)
Alkaline Phosphatase: 70 U/L (ref 38–126)
Anion gap: 7 (ref 5–15)
BUN: 17 mg/dL (ref 8–23)
CO2: 33 mmol/L — ABNORMAL HIGH (ref 22–32)
Calcium: 10.2 mg/dL (ref 8.9–10.3)
Chloride: 97 mmol/L — ABNORMAL LOW (ref 98–111)
Creatinine, Ser: 1.03 mg/dL (ref 0.61–1.24)
GFR, Estimated: >60 mL/min (ref >60)
Glucose, Bld: 99 mg/dL (ref 70–99)
Potassium: 4 mmol/L (ref 3.5–5.1)
Sodium: 137 mmol/L (ref 135–145)
Total Bilirubin: 0.7 mg/dL (ref 0.3–1.2)
Total Protein: 7.4 g/dL (ref 6.5–8.1)

## 2023-03-05 LAB — CBC WITH DIFFERENTIAL/PLATELET
Abs Immature Granulocytes: 0.06 10*3/uL (ref 0.00–0.07)
Basophils Absolute: 0 10*3/uL (ref 0.0–0.1)
Basophils Relative: 0 %
Eosinophils Absolute: 0.2 10*3/uL (ref 0.0–0.5)
Eosinophils Relative: 2 %
HCT: 39.9 % (ref 39.0–52.0)
Hemoglobin: 12.7 g/dL — ABNORMAL LOW (ref 13.0–17.0)
Immature Granulocytes: 1 %
Lymphocytes Relative: 4 %
Lymphs Abs: 0.4 10*3/uL — ABNORMAL LOW (ref 0.7–4.0)
MCH: 31.4 pg (ref 26.0–34.0)
MCHC: 31.8 g/dL (ref 30.0–36.0)
MCV: 98.5 fL (ref 80.0–100.0)
Monocytes Absolute: 0.8 10*3/uL (ref 0.1–1.0)
Monocytes Relative: 8 %
Neutro Abs: 8.8 10*3/uL — ABNORMAL HIGH (ref 1.7–7.7)
Neutrophils Relative %: 85 %
Platelets: 273 10*3/uL (ref 150–400)
RBC: 4.05 MIL/uL — ABNORMAL LOW (ref 4.22–5.81)
RDW: 13.4 % (ref 11.5–15.5)
WBC: 10.3 10*3/uL (ref 4.0–10.5)
nRBC: 0 % (ref 0.0–0.2)

## 2023-03-05 LAB — LACTIC ACID, PLASMA: Lactic Acid, Venous: 0.9 mmol/L (ref 0.5–1.9)

## 2023-03-05 MED ORDER — ACETAMINOPHEN 650 MG RE SUPP: 650.0000 mg | Freq: Four times a day (QID) | RECTAL | Status: DC | PRN

## 2023-03-05 MED ORDER — METRONIDAZOLE 500 MG/100ML IV SOLN
500.0000 mg | Freq: Once | INTRAVENOUS | Status: DC
  Filled 2023-03-05: qty 100

## 2023-03-05 MED ORDER — SODIUM CHLORIDE 0.9 % IV SOLN
2.0000 g | Freq: Three times a day (TID) | INTRAVENOUS | Status: DC
  Administered 2023-03-06 – 2023-03-09 (×11): 2 g via INTRAVENOUS
  Filled 2023-03-05 (×11): qty 12.5

## 2023-03-05 MED ORDER — MORPHINE SULFATE (PF) 4 MG/ML IV SOLN
4.0000 mg | Freq: Once | INTRAVENOUS | Status: DC
  Filled 2023-03-05: qty 1

## 2023-03-05 MED ORDER — ACETAMINOPHEN 325 MG PO TABS
650.0000 mg | ORAL_TABLET | Freq: Four times a day (QID) | ORAL | Status: DC | PRN
  Administered 2023-03-06 – 2023-03-07 (×2): 650 mg via ORAL
  Filled 2023-03-05 (×2): qty 2

## 2023-03-05 MED ORDER — MORPHINE SULFATE (PF) 2 MG/ML IV SOLN
2.0000 mg | INTRAVENOUS | Status: DC | PRN
  Administered 2023-03-06 – 2023-03-09 (×8): 2 mg via INTRAVENOUS
  Filled 2023-03-05 (×8): qty 1

## 2023-03-05 MED ORDER — SENNOSIDES-DOCUSATE SODIUM 8.6-50 MG PO TABS
1.0000 | ORAL_TABLET | Freq: Every evening | ORAL | Status: DC | PRN
  Filled 2023-03-05: qty 1

## 2023-03-05 MED ORDER — GABAPENTIN 300 MG PO CAPS
300.0000 mg | ORAL_CAPSULE | Freq: Two times a day (BID) | ORAL | Status: DC
  Administered 2023-03-06 – 2023-03-09 (×7): 300 mg via ORAL
  Filled 2023-03-05 (×7): qty 1

## 2023-03-05 MED ORDER — ONDANSETRON HCL 4 MG PO TABS: 4.0000 mg | ORAL_TABLET | Freq: Four times a day (QID) | ORAL | Status: DC | PRN

## 2023-03-05 MED ORDER — MORPHINE SULFATE (PF) 4 MG/ML IV SOLN
4.0000 mg | Freq: Once | INTRAVENOUS | Status: AC
  Administered 2023-03-05: 4 mg via INTRAVENOUS
  Filled 2023-03-05: qty 1

## 2023-03-05 MED ORDER — ONDANSETRON HCL 4 MG/2ML IJ SOLN: 4.0000 mg | Freq: Four times a day (QID) | INTRAMUSCULAR | Status: DC | PRN

## 2023-03-05 MED ORDER — GABAPENTIN 300 MG PO CAPS
600.0000 mg | ORAL_CAPSULE | Freq: Every day | ORAL | Status: DC
  Administered 2023-03-05 – 2023-03-08 (×4): 600 mg via ORAL
  Filled 2023-03-05 (×5): qty 2

## 2023-03-05 MED ORDER — OXYCODONE-ACETAMINOPHEN 5-325 MG PO TABS
1.0000 | ORAL_TABLET | Freq: Three times a day (TID) | ORAL | Status: DC | PRN
  Administered 2023-03-06 – 2023-03-09 (×3): 1 via ORAL
  Filled 2023-03-05 (×3): qty 1

## 2023-03-05 MED ORDER — ALBUTEROL SULFATE (2.5 MG/3ML) 0.083% IN NEBU: 2.5000 mg | INHALATION_SOLUTION | RESPIRATORY_TRACT | Status: DC | PRN

## 2023-03-05 MED ORDER — IOHEXOL 300 MG/ML  SOLN
100.0000 mL | Freq: Once | INTRAMUSCULAR | Status: AC | PRN
  Administered 2023-03-05: 80 mL via INTRAVENOUS

## 2023-03-05 MED ORDER — SODIUM CHLORIDE 0.9 % IV SOLN: 2.0000 g | Freq: Three times a day (TID) | INTRAVENOUS | Status: DC

## 2023-03-05 MED ORDER — METRONIDAZOLE 500 MG/100ML IV SOLN
500.0000 mg | Freq: Two times a day (BID) | INTRAVENOUS | Status: DC
  Administered 2023-03-05 – 2023-03-09 (×8): 500 mg via INTRAVENOUS
  Filled 2023-03-05 (×7): qty 100

## 2023-03-05 MED ORDER — ENOXAPARIN SODIUM 40 MG/0.4ML IJ SOSY
40.0000 mg | PREFILLED_SYRINGE | INTRAMUSCULAR | Status: DC
  Administered 2023-03-05 – 2023-03-08 (×4): 40 mg via SUBCUTANEOUS
  Filled 2023-03-05 (×4): qty 0.4

## 2023-03-05 MED ORDER — SODIUM CHLORIDE 0.9 % IV SOLN
2.0000 g | Freq: Once | INTRAVENOUS | Status: AC
  Administered 2023-03-05: 2 g via INTRAVENOUS
  Filled 2023-03-05: qty 12.5

## 2023-03-05 NOTE — ED Provider Notes (Signed)
[place] EMERGENCY DEPARTMENT AT [facility] Provider Note   CSN: [id number] Arrival date & time: 03/05/23  7829     History  Chief Complaint  Patient presents with   Abscess   Rectal Pain    Mathew [name] is a 62 y.o. male with a past medical history of prostate cancer, lung cancer presented today for evaluation of rectal pain.  Patient recently got an MRI done on/14/2024 which show breakdown of the anterior left rectal wall resulting in fistulous communication to a deep left pelvic gas collection and a focal fluid outpouching at the right rectal wall.  Patient reports that this happened to 2 weeks after he got his radiation for prostate cancer.  He noticed some blood in his stools this morning. Patient is being seen by Dr. Shirline Frees oncologist at [facility] and a pulmonary oncologist at [facility].  Patient reports that he has been taking Cipro x 2, metronidazole and doxycycline at home with no relief.   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   OA (osteoarthritis)    Rash    10-15-2022  per pt due to chemo   Shortness of breath dyspnea    just initially   Vitamin D deficiency    Wears hearing aid in both ears    Past Surgical History:  Procedure Laterality Date   GOLD SEED IMPLANT N/A 10/21/2022   Procedure: GOLD SEED IMPLANT;  Surgeon: Noel Christmas, MD;  Location: [facility];  Service: Urology;  Laterality: N/A;   INTERCOSTAL NERVE BLOCK Right 04/27/2020   Procedure: INTERCOSTAL NERVE BLOCK;  Surgeon: Loreli Slot, MD;  Location: [facility] OR;  Service: Thoracic;  Laterality: Right;   KNEE CARTILAGE SURGERY Left 1976   RESECTION OF MEDIASTINAL MASS Right 01/23/2016   @[facility]  by dr Dorris Fetch;   resection chest wall mass w/ en bloc wedge resection of RLL   RESECTION OF MEDIASTINAL MASS Right 11/13/2021   Procedure: RESECTION OF PLEURAL TUMORS;  Surgeon: Loreli Slot, MD;  Location: [facility] OR;  Service: Thoracic;  Laterality: Right;   SPACE OAR INSTILLATION N/A 10/21/2022   Procedure: SPACE OAR INSTILLATION;  Surgeon: Noel Christmas, MD;  Location: [facility];  Service: Urology;  Laterality: N/A;   THORACOTOMY Right 04/27/2020   Procedure: THORACOTOMY;  Surgeon: Loreli Slot, MD;  Location: [facility] OR;  Service: Thoracic;  Laterality: Right;   THORACOTOMY Right 11/13/2021   Procedure: REDO THORACOTOMY;  Surgeon: Loreli Slot, MD;  Location: [facility] OR;  Service: Thoracic;  Laterality: Right;   TONSILLECTOMY     child   VIDEO BRONCHOSCOPY WITH ENDOBRONCHIAL ULTRASOUND N/A 12/17/2015   Procedure: VIDEO BRONCHOSCOPY WITH ENDOBRONCHIAL ULTRASOUND;  Surgeon: Loreli Slot, MD;  Location: [facility]  OR;  Service: Thoracic;  Laterality: N/A;     Home Medications Prior to Admission medications   Medication Sig Start Date End Date Taking? Authorizing Provider  amLODipine (NORVASC) 10 MG tablet Take 10 mg by mouth daily. Patient not taking: Reported on 11/06/2022    [provider]  bevacizumab-awwb (MVASI) 400 MG/16ML SOLN Inject 5 mg/kg into the vein once.    [provider]  ciprofloxacin (CIPRO) 500 MG tablet Take 500 mg by mouth 2 (two) times daily. urology    [provider]  docusate sodium (COLACE) 100 MG capsule Take 1 capsule (100 mg total) by mouth  every 12 (twelve) hours. 01/18/23   Eleonore Chiquito, FNP  gabapentin (NEURONTIN) 100 MG capsule Take 200 mg by mouth 3 (three) times daily.    [provider]  hydrocortisone (ANUSOL-HC) 25 MG suppository Place 1 suppository (25 mg total) rectally 2 (two) times daily. 01/29/23   Bruning, Ashlyn, PA-C  meloxicam (MOBIC) 15 MG tablet Take 1 tablet by mouth daily as needed.    [provider]  metroNIDAZOLE (FLAGYL) 500 MG tablet Take 500 mg by mouth 3 (three) times daily. Urology - 7 day prescription started 8/27    [provider]  oxyCODONE-acetaminophen (PERCOCET/ROXICET) 5-325 MG tablet Take 1 tablet by mouth every 8 (eight) hours as needed for severe pain. 01/29/23   Si Gaul, MD  predniSONE (DELTASONE) 5 MG tablet Take 15 mg by mouth 2 (two) times daily.    [provider]  tamsulosin (FLOMAX) 0.4 MG CAPS capsule Take 0.4 mg by mouth daily. Urology started 8/27    [provider]  temozolomide (TEMODAR) 100 MG capsule Take 100 mg by mouth at bedtime. 09/24/22   [provider]      Allergies    Aspirin and Penicillins    Review of Systems   Review of Systems Negative except as per HPI.  Physical Exam Updated Vital Signs BP 129/86   Pulse 77   Temp 98.6 F (37 C) (Oral)   Resp 18   Ht 5\' 9"  (1.753 m)   Wt 70.8 kg   SpO2 100%   BMI 23.04 kg/m  Physical Exam Vitals and nursing note reviewed.  Constitutional:      Appearance: Normal appearance.  HENT:     Head: Normocephalic and atraumatic.     Mouth/Throat:     Mouth: Mucous membranes are moist.  Eyes:     General: No scleral icterus. Cardiovascular:     Rate and Rhythm: Normal rate and regular rhythm.     Pulses: Normal pulses.     Heart sounds: Normal heart sounds.  Pulmonary:     Effort: Pulmonary effort is normal.     Breath sounds: Normal breath sounds.  Abdominal:     General: Abdomen is flat.     Palpations: Abdomen is soft.     Tenderness: There is no  abdominal tenderness.  Musculoskeletal:        General: No deformity.  Skin:    General: Skin is warm.     Findings: No rash.  Neurological:     General: No focal deficit present.     Mental Status: He is alert.  Psychiatric:        Mood and Affect: Mood normal.     ED Results / Procedures / Treatments   Labs (all labs ordered are listed, but only abnormal results are displayed) Labs Reviewed  COMPREHENSIVE METABOLIC PANEL - Abnormal; Notable for the following  components:      Result Value   Chloride 97 (*)    CO2 33 (*)    AST 14 (*)    All other components within normal limits  CBC WITH DIFFERENTIAL/PLATELET - Abnormal; Notable for the following components:   RBC 4.05 (*)    Hemoglobin 12.7 (*)    Neutro Abs 8.8 (*)    Lymphs Abs 0.4 (*)    All other components within normal limits  LACTIC ACID, PLASMA    EKG None  Radiology CT PELVIS W CONTRAST  Result Date: 03/05/2023 CLINICAL DATA:  fistula, anorectal  abscess EXAM: CT PELVIS WITH CONTRAST TECHNIQUE: Multidetector CT imaging of the pelvis was performed using the standard protocol following the bolus administration of intravenous contrast. RADIATION DOSE REDUCTION: This exam was performed according to the departmental dose-optimization program which includes automated exposure control, adjustment of the mA and/or kV according to patient size and/or use of iterative reconstruction technique. CONTRAST:  80mL OMNIPAQUE IOHEXOL 300 MG/ML  SOLN COMPARISON:  MRI from 02/28/2023. FINDINGS: Urinary Tract: There is a 1.7 x 2.1 cm simple cyst in the right kidney lower pole. Unremarkable urinary bladder. Bowel: No disproportionate dilation of the visualized small or large bowel loops. No evidence of abnormal bowel wall thickening or inflammatory changes. The appendix is unremarkable. Vascular/Lymphatic: No ascites or pneumoperitoneum. No pelvic lymphadenopathy, by size criteria. No aneurysmal dilation of the major arteries. There are  mild peripheral atherosclerotic vascular calcifications of the aorta and its major branches. Reproductive: Prostate gland with radiation fiducial markers noted. Other: There is small amount of free fluid in the dependent pelvis, which is abnormal in the patient of this age group. However, overall, there is interval decrease in the amount of fluid when compared to the recent MRI. There is small amount of air along the left posteroinferior aspect of the prostate gland, which appears similar to the recent MRI pelvis. However, distinct fistulous tract is not seen on this exam. Musculoskeletal: No suspicious osseous lesions. There are mild multilevel degenerative changes in the visualized spine. IMPRESSION: 1. Small amount of free fluid in the dependent pelvis, which is abnormal in the patient of this age group. However, overall, there is interval decrease in the amount of fluid when compared to the recent MRI from 02/28/2023. 2. Small amount of air along the left posteroinferior aspect of the prostate gland, which appears similar to the recent MRI pelvis. Aortic Atherosclerosis (ICD10-I70.0). Electronically Signed   By: Jules Schick M.D.   On: 03/05/2023 15:23    Procedures Procedures    Medications Ordered in ED Medications  metroNIDAZOLE (FLAGYL) IVPB 500 mg (has no administration in time range)  ceFEPIme (MAXIPIME) 2 g in sodium chloride 0.9 % 100 mL IVPB (has no administration in time range)  morphine (PF) 4 MG/ML injection 4 mg (has no administration in time range)  morphine (PF) 4 MG/ML injection 4 mg (4 mg Intravenous Given 03/05/23 1119)  iohexol (OMNIPAQUE) 300 MG/ML solution 100 mL (80 mLs Intravenous Contrast Given 03/05/23 1250)  ceFEPIme (MAXIPIME) 2 g in sodium chloride 0.9 % 100 mL IVPB (2 g Intravenous New Bag/Given 03/05/23 1625)    ED Course/ Medical Decision Making/ A&P                                 Medical Decision Making Amount and/or Complexity of Data Reviewed Labs:  ordered. Radiology: ordered.  Risk Prescription drug management. Decision  regarding hospitalization.   This patient presents to the ED for rectal pain, this involves an extensive number of treatment options, and is a complaint that carries with a high risk of complications and morbidity.  The differential diagnosis includes fistula, abscess, polyps, hemorrhoids, perforation this is not an exhaustive list.  Lab tests: I ordered and personally interpreted labs.  The pertinent results include: WBC unremarkable. Hbg unremarkable. Platelets unremarkable. Electrolytes unremarkable. BUN, creatinine unremarkable.  Lactic acid is unremarkable.  Imaging studies: I ordered imaging studies, personally reviewed, interpreted imaging and agree with the radiologist's interpretations. The results include: CT pelvis with contrast showed 1. Small amount of free fluid in the dependent pelvis, which is abnormal in the patient of this age group. However, overall, there is interval decrease in the amount of fluid when compared to the recent MRI from 02/28/2023. 2. Small amount of air along the left posteroinferior aspect of the prostate gland, which appears similar to the recent MRI pelvis.   Problem list/ ED course/ Critical interventions/ Medical management: HPI: See above Vital signs within normal range and stable throughout visit. Laboratory/imaging studies significant for: See above. On physical examination, patient is afebrile and appears in no acute distress.  Patient presents with a chief complaint of rectal pain in the last 8 weeks after radiation therapy.  Also had 1 episodes of rectal bleeding today.  CBC with no leukocytosis, hemoglobin at baseline.  CMP with no acute electrolyte abnormalities or AKI.  CT pelvis with contrast today did not show significant change from his last MRI which was done 5 days ago.  Given patient's symptoms have not improved with oral antibiotics, he will require admission for IV  antibiotics and general surgery consultation.  Given morphine for pain.  Reevaluation of patient after this medication showed patient has improved. I have reviewed the patient home medicines and have made adjustments as needed.  Cardiac monitoring/EKG: The patient was maintained on a cardiac monitor.  I personally reviewed and interpreted the cardiac monitor which showed an underlying rhythm of: sinus rhythm.  Additional history obtained: External records from outside source obtained and reviewed including: Chart review including previous notes, labs, imaging.  Consultations obtained: I spoke to [facility] general surgery.  She recommended CT pelvis with contrast and admission to medicine and general surgery will follow-up inpatient.  She will inform Dr. Cliffton Asters general surgery. I spoke to Dr. Jonathon Bellows [facility] hospitalist.  He agrees to admit the patient.  Disposition Admit This chart was dictated using voice recognition software.  Despite best efforts to proofread,  errors can occur which can change the documentation meaning.          Final Clinical Impression(s) / ED Diagnoses Final diagnoses:  Anorectal fistula    Rx / DC Orders ED Discharge Orders     None         Jeanelle Malling, [place] 03/05/23 1707    Cathren Laine, MD 03/10/23 [phone]

## 2023-03-05 NOTE — ED Notes (Signed)
Patient transported to CT 

## 2023-03-05 NOTE — ED Notes (Signed)
Ambulatory to restroom

## 2023-03-05 NOTE — Progress Notes (Deleted)
Palliative Medicine [facility]  Telephone:([phone]) [phone] Fax:([phone]) [phone]   Name: [name] Date: 03/05/2023 MRN: [id number]  DOB: 27-Sep-1960  Patient Care Team: [name], MD as PCP - General (Internal Medicine) Cherlyn Cushing, RN as Oncology Nurse Navigator    REASON FOR CONSULTATION: Mathew [name] is a 62 y.o. male with oncologic medical history including lung cancer (11/2015) and prostate cancer (07/2022) which is being managed by urology. Palliative ask to see for symptom management and goals of care.    SOCIAL HISTORY:     reports that he has never smoked. He quit smokeless tobacco use about 46 years ago.  His smokeless tobacco use included chew. He reports that he does not currently use alcohol. He reports that he does not currently use drugs after having used the following drugs: Marijuana.  ADVANCE DIRECTIVES:  None on file  CODE STATUS: Full Code  PAST MEDICAL HISTORY: Past Medical History:  Diagnosis Date  . Anemia   . Clubbing of nails   . Deafness in left ear   . Encounter for antineoplastic chemotherapy   . History of kidney stones   . Hypertension   . Hypoglycemia due to neoplasm   . Malignant neoplasm prostate [facility]) 07/2022   urologist--- dr pace;   bx 01/ 2024, Gleason 4+3  . 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 Riedal/  local [place]-- dr Sofie Hartigan;  dx 06/ 2017  resection RLL mass ;  recurrent 11/ 2021  s/p resection pleura tumor's, metastatic ;   05/ 2023  s/p resection tumor's,  HG plemorphic sarcoma  . 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 OA (osteoarthritis)   . Rash    10-15-2022  per pt due to chemo  . Shortness of breath dyspnea    just initially  . Vitamin D deficiency   . Wears hearing aid in both ears     PAST SURGICAL HISTORY:  Past Surgical History:  Procedure Laterality Date  . GOLD SEED IMPLANT N/A 10/21/2022    Procedure: GOLD SEED IMPLANT;  Surgeon: Noel Christmas, MD;  Location: [facility];  Service: Urology;  Laterality: N/A;  . INTERCOSTAL NERVE BLOCK Right 04/27/2020   Procedure: INTERCOSTAL NERVE BLOCK;  Surgeon: Loreli Slot, MD;  Location: [facility] OR;  Service: Thoracic;  Laterality: Right;  . KNEE CARTILAGE SURGERY Left 1976  . RESECTION OF MEDIASTINAL MASS Right 01/23/2016   @[facility]  by dr Dorris Fetch;   resection chest wall mass w/ en bloc wedge resection of RLL  . RESECTION OF MEDIASTINAL MASS Right 11/13/2021   Procedure: RESECTION OF PLEURAL TUMORS;  Surgeon: Loreli Slot, MD;  Location: [facility] OR;  Service: Thoracic;  Laterality: Right;  . SPACE OAR INSTILLATION N/A 10/21/2022   Procedure: SPACE OAR INSTILLATION;  Surgeon: Noel Christmas, MD;  Location: [facility];  Service: Urology;  Laterality: N/A;  . THORACOTOMY Right 04/27/2020   Procedure: THORACOTOMY;  Surgeon: Loreli Slot, MD;  Location: [facility] OR;  Service: Thoracic;  Laterality: Right;  . THORACOTOMY Right 11/13/2021   Procedure: REDO THORACOTOMY;  Surgeon: Loreli Slot, MD;  Location: [facility] OR;  Service: Thoracic;  Laterality: Right;  . TONSILLECTOMY     child  . VIDEO BRONCHOSCOPY WITH ENDOBRONCHIAL ULTRASOUND N/A 12/17/2015   Procedure: VIDEO BRONCHOSCOPY WITH ENDOBRONCHIAL ULTRASOUND;  Surgeon: Loreli Slot, MD;  Location: [facility] OR;  Service: Thoracic;  Laterality: N/A;    HEMATOLOGY/ONCOLOGY HISTORY:  Oncology History  Malignant neoplasm of prostate (HCC)  07/16/2022 Cancer Staging   Staging form: Prostate, AJCC 8th Edition - Clinical stage from 07/16/2022: Stage IIC (cT1c, cN0, cM0, PSA: 10.2, Grade Group: 3) - Signed by Marcello Fennel, PA-C on 10/22/2022 Histopathologic type: Adenocarcinoma, NOS Stage prefix: Initial diagnosis Prostate specific antigen (PSA) range: 10 to 19 Gleason primary pattern: 4 Gleason secondary pattern: 3 Gleason score: 7 Histologic  grading system: 5 grade system Number of biopsy cores examined: 16 Number of biopsy cores positive: 10 Location of positive needle core biopsies: One side   08/26/2022 Initial Diagnosis   Malignant neoplasm of prostate (HCC)     ALLERGIES:  is allergic to aspirin and penicillins.  MEDICATIONS:  No current facility-administered medications for this visit.   Current Outpatient Medications  Medication Sig Dispense Refill  . amLODipine (NORVASC) 10 MG tablet Take 10 mg by mouth daily. (Patient not taking: Reported on 11/06/2022)    . bevacizumab-awwb (MVASI) 400 MG/16ML SOLN Inject 5 mg/kg into the vein once.    . ciprofloxacin (CIPRO) 500 MG tablet Take 500 mg by mouth 2 (two) times daily. urology    . docusate sodium (COLACE) 100 MG capsule Take 1 capsule (100 mg total) by mouth every 12 (twelve) hours. 60 capsule 0  . gabapentin (NEURONTIN) 100 MG capsule Take 200 mg by mouth 3 (three) times daily.    . hydrocortisone (ANUSOL-HC) 25 MG suppository Place 1 suppository (25 mg total) rectally 2 (two) times daily. 24 suppository 1  . meloxicam (MOBIC) 15 MG tablet Take 1 tablet by mouth daily as needed.    . metroNIDAZOLE (FLAGYL) 500 MG tablet Take 500 mg by mouth 3 (three) times daily. Urology - 7 day prescription started 8/27    . oxyCODONE-acetaminophen (PERCOCET/ROXICET) 5-325 MG tablet Take 1 tablet by mouth every 8 (eight) hours as needed for severe pain. 20 tablet 0  . predniSONE (DELTASONE) 5 MG tablet Take 15 mg by mouth 2 (two) times daily.    . tamsulosin (FLOMAX) 0.4 MG CAPS capsule Take 0.4 mg by mouth daily. Urology started 8/27    . temozolomide (TEMODAR) 100 MG capsule Take 100 mg by mouth at bedtime.     Facility-Administered Medications Ordered in Other Visits  Medication Dose Route Frequency Provider Last Rate Last Admin  . sodium phosphate (FLEET) 7-19 GM/118ML enema 1 enema  1 enema Rectal Once Kasandra Knudsen D, MD      . sodium phosphate (FLEET) 7-19 GM/118ML enema 1  enema  1 enema Rectal Once Noel Christmas, MD        VITAL SIGNS: There were no vitals taken for this visit. There were no vitals filed for this visit.  Estimated body mass index is 23.04 kg/m as calculated from the following:   Height as of 03/05/23: 5\' 9"  (1.753 m).   Weight as of 03/05/23: 156 lb (70.8 kg).  LABS: CBC:    Component Value Date/Time   WBC 10.3 03/05/2023 1013   HGB 12.7 (L) 03/05/2023 1013   HGB 12.9 (L) 02/26/2023 0820   HCT 39.9 03/05/2023 1013   PLT 273 03/05/2023 1013   PLT 279 02/26/2023 0820   MCV 98.5 03/05/2023 1013   NEUTROABS 8.8 (H) 03/05/2023 1013   LYMPHSABS 0.4 (L) 03/05/2023 1013   MONOABS 0.8 03/05/2023 1013   EOSABS 0.2 03/05/2023 1013   BASOSABS 0.0 03/05/2023 1013   Comprehensive Metabolic Panel:    Component Value  Date/Time   NA 137 03/05/2023 1013   K 4.0 03/05/2023 1013   CL 97 (L) 03/05/2023 1013   CO2 33 (H) 03/05/2023 1013   BUN 17 03/05/2023 1013   CREATININE 1.03 03/05/2023 1013   CREATININE 1.03 02/26/2023 0822   GLUCOSE 99 03/05/2023 1013   CALCIUM 10.2 03/05/2023 1013   AST 14 (L) 03/05/2023 1013   AST 15 02/26/2023 0822   ALT 11 03/05/2023 1013   ALT 12 02/26/2023 0822   ALKPHOS 70 03/05/2023 1013   BILITOT 0.7 03/05/2023 1013   BILITOT 0.7 02/26/2023 0822   PROT 7.4 03/05/2023 1013   ALBUMIN 3.7 03/05/2023 1013    RADIOGRAPHIC STUDIES: MR PELVIS W WO CONTRAST  Result Date: 03/03/2023 CLINICAL DATA:  Evaluate anorectal abscess. Rectal pain for 6 weeks. Radiation seeds for prostate cancer in May. History of known metastatic sarcoma. History of prostate cancer. EXAM: MRI PELVIS WITHOUT AND WITH CONTRAST TECHNIQUE: Multiplanar multisequence MR imaging of the pelvis was performed both before and after administration of intravenous contrast. CONTRAST:  7 cc Vueway COMPARISON:  Outside CT from [facility] dated 01/21/2023. prostate MRI of 01/10/2022. FINDINGS: Focused exam of the low pelvis was performed. Urinary  Tract: Normal appearance of the urinary bladder, without intravesicular air. Bowel: The distal anus is normal. Involving the upper anus and rectal anal junction is edema ill definition of the anterior and left wall with a fistulous tract arising from approximately the 1 o'clock position, extending anteriorly and minimally superiorly to communicate with a periprosthetic left pelvic floor gas collection. The fistulous tract is most apparent on images 11/9 and 20/2. The periprosthetic primarily gas collection measures 2.3 x 1.3 cm on 08/09 and contiguous more caudally into the deep pelvic floor at 1.7 x 2.0 cm on 10/19. This gas collection is more well-defined than on 01/21/2023 CT. Involving the approximately 10 o'clock position of the anorectal junction is a focal fluid signal outpouching including on 13/9 and 13/6 of 1.0 cm. Normal appearance of the ischioanal fossa. Vascular/Lymphatic: Limited evaluation for pelvic adenopathy. No pelvic aneurysm identified. Reproductive: Prostate is poorly evaluated on this nondedicated exam. Morphology is likely altered by recent radiation. Other: There may be small volume cul-de-sac complex fluid versus edematous fat including on 06/05. Musculoskeletal: No gross osseous abnormality. IMPRESSION: 1. Focused evaluation of the low pelvis performed. 2. Inflamed segment of anorectal junction (presumably radiation induced) with breakdown of the anterior left rectal wall resulting in fistulous communication to a deep left pelvic gas collection as detailed above. Focal fluid outpouching about the right rectal wall at this level could represent a separate focus of mucosal breakdown. 3. Suspect increased complex cul-de-sac fluid versus edematous fat compared to the CT of 01/21/2023. Electronically Signed   By: Jeronimo Greaves M.D.   On: 03/03/2023 14:12    PERFORMANCE STATUS (ECOG) : {CHL ONC ECOG ZO:[id number]}  Review of Systems Unless otherwise noted, a complete review of systems is  negative.  Physical Exam General: NAD Cardiovascular: regular rate and rhythm Pulmonary: clear ant fields Abdomen: soft, nontender, + bowel sounds Extremities: no edema, no joint deformities Skin: no rashes Neurological:  IMPRESSION: *** I introduced myself, Hanah Moultry RN, and Palliative's role in collaboration with the oncology team. Concept of Palliative Care was introduced as specialized medical care for people and their families living with serious illness.  It focuses on providing relief from the symptoms and stress of a serious illness.  The goal is to improve quality of life for both the patient  and the family. Values and goals of care important to patient and family were attempted to be elicited.    We discussed *** current illness and what it means in the larger context of *** on-going co-morbidities. Natural disease trajectory and expectations were discussed.  I discussed the importance of continued conversation with family and their medical providers regarding overall plan of care and treatment options, ensuring decisions are within the context of the patients values and GOCs.  PLAN: Established therapeutic relationship. Education provided on palliative's role in collaboration with their Oncology/Radiation team. I will plan to see patient back in 2-4 weeks in collaboration to other oncology appointments.    Patient expressed understanding and was in agreement with this plan. He also understands that He can call the clinic at any time with any questions, concerns, or complaints.   Thank you for your referral and allowing Palliative to assist in Mr. Kerney Nihart Yasin's care.   Number and complexity of problems addressed: ***HIGH - 1 or more chronic illnesses with SEVERE exacerbation, progression, or side effects of treatment - advanced cancer, pain. Any controlled substances utilized were prescribed in the context of palliative care.   Visit consisted of counseling and education  dealing with the complex and emotionally intense issues of symptom management and palliative care in the setting of serious and potentially life-threatening illness.Greater than 50%  of this time was spent counseling and coordinating care related to the above assessment and plan.  Signed by: Willette Alma, AGPCNP-BC Palliative Medicine Team/[facility]   *Please note that this is a verbal dictation therefore any spelling or grammatical errors are due to the "[employer]agon [employer] One" system interpretation.

## 2023-03-05 NOTE — ED Notes (Signed)
Given meal. Ambulates around room with no assist. Returns to bed for med admin without incident. Informed of admit process. No needs expressed at this time. Family remains at bedside.

## 2023-03-05 NOTE — Progress Notes (Signed)
Radiation Oncology         [phone]) [phone] ________________________________  Name: Mathew [name] MRN: [id number]  Date: 03/05/2023  DOB: 1961-06-01  Chart Note:  I reviewed this patient's most recent findings and wanted to take a minute to document my impression.  I have been discussing this patient's care Dr. Cliffton Asters of general surgery through epic in basket.  Most recently, he had a pelvic MRI a fistulous communication between the distal rectum spaceOAR consistent with rectal perforation related to the hydrogel spacer I talked with Dr. Cliffton Asters about management of SpaceOAR related rectal injury I think some of the strategy which is similar to the conservative manage of diverticulitis with low residue diet and broad-spectrum antibiotics.  However, this patient is healing could potentially be delayed due to his regimen of Temodar and Avastin under the direction of the oncology team at [facility].  Accordingly, I sent an outside message to his oncologist at [facility] recommending a pause and chemotherapy for 2 to 4 weeks to promote rectal healing.  I telephoned the patient this morning to review recent events and outline our current strategy.  He has completed a course of Cipro and Flagyl without relief.  Under Dr. Lucilla Lame recommendation, he is planning to [facility].  I emailed the patient low residue diet instructions.  Lab Findings: Lab Results  Component Value Date   WBC 8.3 02/26/2023   HGB 12.9 (L) 02/26/2023   HCT 40.3 02/26/2023   MCV 97.6 02/26/2023   PLT 279 02/26/2023      Chemistry      Component Value Date/Time   NA 139 02/26/2023 0822   K 3.4 (L) 02/26/2023 0822   CL 104 02/26/2023 0822   CO2 30 02/26/2023 0822   BUN 17 02/26/2023 0822   CREATININE 1.03 02/26/2023 0822      Component Value Date/Time   CALCIUM 9.3 02/26/2023 0822   ALKPHOS 67 02/26/2023 0822   AST 15 02/26/2023 0822   ALT 12 02/26/2023 0822   BILITOT 0.7  02/26/2023 0822       Radiographic Findings: MR PELVIS W WO CONTRAST  Result Date: 03/03/2023 CLINICAL DATA:  Evaluate anorectal abscess. Rectal pain for 6 weeks. Radiation seeds for prostate cancer in May. History of known metastatic sarcoma. History of prostate cancer. EXAM: MRI PELVIS WITHOUT AND WITH CONTRAST TECHNIQUE: Multiplanar multisequence MR imaging of the pelvis was performed both before and after administration of intravenous contrast. CONTRAST:  7 cc Vueway COMPARISON:  Outside CT from [facility] dated 01/21/2023. prostate MRI of 01/10/2022. FINDINGS: Focused exam of the low pelvis was performed. Urinary Tract: Normal appearance of the urinary bladder, without intravesicular air. Bowel: The distal anus is normal. Involving the upper anus and rectal anal junction is edema ill definition of the anterior and left wall with a fistulous tract arising from approximately the 1 o'clock position, extending anteriorly and minimally superiorly to communicate with a periprosthetic left pelvic floor gas collection. The fistulous tract is most apparent on images 11/9 and 20/2. The periprosthetic primarily gas collection measures 2.3 x 1.3 cm on 08/09 and contiguous more caudally into the deep pelvic floor at 1.7 x 2.0 cm on 10/19. This gas collection is more well-defined than on 01/21/2023 CT. Involving the approximately 10 o'clock position of the anorectal junction is a focal fluid signal outpouching including on 13/9 and 13/6 of 1.0 cm. Normal appearance of the ischioanal fossa. Vascular/Lymphatic: Limited evaluation for pelvic adenopathy. No pelvic  aneurysm identified. Reproductive: Prostate is poorly evaluated on this nondedicated exam. Morphology is likely altered by recent radiation. Other: There may be small volume cul-de-sac complex fluid versus edematous fat including on 06/05. Musculoskeletal: No gross osseous abnormality. IMPRESSION: 1. Focused evaluation of the low pelvis performed. 2.  Inflamed segment of anorectal junction (presumably radiation induced) with breakdown of the anterior left rectal wall resulting in fistulous communication to a deep left pelvic gas collection as detailed above. Focal fluid outpouching about the right rectal wall at this level could represent a separate focus of mucosal breakdown. 3. Suspect increased complex cul-de-sac fluid versus edematous fat compared to the CT of 01/21/2023. Electronically Signed   By: Jeronimo Greaves M.D.   On: 03/03/2023 14:12    Impression:  In light of this information, the patient appears to have a rectal injury related to hydrogel spacer used in the treatment of his prostate cancer.  I am optimistic that conservative treatment can result in healing within the next 2 to 4 weeks.  Plan:  At this point, the patient is set up to proceed with intravenous [facility], low residue diet, pause and chemotherapy and follow-up with me on October 3.  ________________________________  Artist Pais. Kathrynn Running, M.D.

## 2023-03-05 NOTE — ED Triage Notes (Signed)
Patient arrives with complaints of worsening chronic rectal abscess. Patient states that the abscess has been ongoing for 8 weeks. Recently has a MRI. He has completed 2 courses of antibiotics. Referred here for IV antibiotics. No fever/chills.

## 2023-03-05 NOTE — H&P (Signed)
History and Physical  Mathew [name] QIO:[id number] DOB: [birth year] DOA: 03/05/2023  PCP: [name], MD   Chief Complaint: Rectal pain  HPI: Mathew [name] is a 62 y.o. 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  Patient states that he has been undergoing systemic chemotherapy and has completed 10 cycles, also completed radiation to the prostate this past July.  For the last 8 weeks, he has been experiencing severe rectal pain, without any fevers, has only had some mild intermittent bleeding but nothing significant.  He has been followed closely by his oncologist at [facility], as well as Dr. Arbutus Ped at the [facility] cancer center.  Due to continued rectal pain, he had MRI of the pelvis on 9/14, prior to which he was given a course of oral antibiotics empirically.  His symptoms did not improve, so he was advised by his radiation oncologist to present to [facility] today.  On evaluation in the ER, he has been afebrile his vital signs are unremarkable.  Lab work was done shows stable mild anemia, and otherwise unremarkable.  He had CT scan of the abdomen pelvis as detailed below, was given empiric IV antibiotics and admitted to the hospitalist service at [facility].  Review of Systems: Please see HPI for pertinent positives and negatives. A complete 10 system review of systems are otherwise negative.  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   Metastatic sarcoma (HCC) 10/2021   OA (osteoarthritis)    Rash    10-15-2022  per pt due to chemo   Shortness of breath dyspnea    just initially   Vitamin D deficiency    Wears hearing aid in both ears    Past Surgical History:  Procedure Laterality Date   GOLD SEED IMPLANT N/A 10/21/2022   Procedure: GOLD SEED IMPLANT;  Surgeon: Noel Christmas, MD;  Location: [facility];  Service: Urology;  Laterality: N/A;   INTERCOSTAL NERVE BLOCK Right 04/27/2020   Procedure: INTERCOSTAL NERVE BLOCK;  Surgeon: Loreli Slot, MD;  Location: [facility] OR;  Service: Thoracic;  Laterality: Right;   KNEE CARTILAGE SURGERY Left 1976   RESECTION OF MEDIASTINAL MASS Right 01/23/2016   @[facility]  by dr Dorris Fetch;   resection chest wall mass w/ en bloc wedge resection of RLL   RESECTION OF MEDIASTINAL MASS Right 11/13/2021   Procedure: RESECTION OF PLEURAL TUMORS;  Surgeon: Loreli Slot, MD;  Location: [facility] OR;  Service: Thoracic;  Laterality: Right;   SPACE OAR INSTILLATION N/A 10/21/2022   Procedure: SPACE OAR INSTILLATION;  Surgeon: Noel Christmas, MD;  Location: [facility];  Service: Urology;  Laterality: N/A;   THORACOTOMY  Right 04/27/2020   Procedure: THORACOTOMY;  Surgeon: Loreli Slot, MD;  Location: [facility] OR;  Service: Thoracic;  Laterality: Right;   THORACOTOMY Right 11/13/2021   Procedure: REDO THORACOTOMY;  Surgeon: Loreli Slot, MD;  Location: [facility] OR;  Service: Thoracic;  Laterality: Right;   TONSILLECTOMY     child   VIDEO BRONCHOSCOPY WITH ENDOBRONCHIAL ULTRASOUND N/A 12/17/2015   Procedure: VIDEO BRONCHOSCOPY WITH ENDOBRONCHIAL ULTRASOUND;  Surgeon: Loreli Slot, MD;  Location: [facility] OR;  Service: Thoracic;  Laterality: N/A;    Social History:   reports that he has never smoked. He quit smokeless tobacco use about 46 years ago.  His smokeless tobacco use included chew. He reports that he does not currently use alcohol. He reports that he does not currently use drugs after having used the following drugs: Marijuana.   Allergies  Allergen Reactions   Aspirin Shortness Of Breath    Other Reaction(s): Wheezing   Penicillins Rash    Has patient had a PCN reaction causing immediate rash, facial/tongue/throat swelling, SOB or lightheadedness with hypotension:YES Has patient had a PCN reaction causing severe rash involving mucus membranes or skin necrosis: NO Has patient had a PCN reaction that required hospitalization NO Has patient had a PCN reaction occurring within the last 10 years: NO If all of the above answers are "NO", then may proceed with Cephalosporin use.    Family History  Problem Relation Age of Onset   Diabetes type II Mother    Hypertension Father      Prior to Admission medications   Medication Sig Start Date End Date Taking? Authorizing Provider  amLODipine (NORVASC) 10 MG tablet Take 10 mg by mouth daily.   Yes [provider]  docusate sodium (COLACE) 100 MG capsule Take 1 capsule (100 mg total) by mouth every 12 (twelve) hours. Patient taking differently: Take 100 mg by mouth daily. 01/18/23  Yes Eleonore Chiquito, FNP  folic acid (FOLVITE) 400 MCG tablet Take 400 mcg by mouth daily.   Yes [provider]  gabapentin (NEURONTIN) 100 MG capsule Take 300 mg by mouth 2 (two) times daily.   Yes [provider]  gabapentin (NEURONTIN) 600 MG tablet Take 600 mg by mouth at bedtime.   Yes [provider]  Multiple Vitamin (MULTIVITAMIN) capsule Take 1 capsule by mouth daily.   Yes [provider]  predniSONE (DELTASONE) 5 MG tablet Take 5 mg by mouth daily.   Yes [provider]  tamsulosin (FLOMAX) 0.4 MG CAPS capsule Take 0.4 mg by mouth at bedtime. Urology started  8/27   Yes [provider]  temozolomide (TEMODAR) 100 MG capsule Take 300 mg by mouth at bedtime. On for 7 days, off for 7 days 09/24/22  Yes [provider]  bevacizumab-awwb (MVASI) 400 MG/16ML SOLN Inject 5 mg/kg into the vein once.    [provider]  ciprofloxacin (CIPRO) 500 MG tablet Take 500 mg by mouth 2 (two) times daily. urology Patient not taking: Reported on 03/05/2023    [provider]  hydrocortisone (ANUSOL-HC) 25 MG suppository Place 1 suppository (25 mg total) rectally 2 (two) times daily. Patient not taking: Reported on 03/05/2023 01/29/23   Bruning, Ashlyn, PA-C  meloxicam (MOBIC) 15 MG tablet Take 1 tablet by mouth daily as needed. Patient not taking: Reported on 03/05/2023    [provider]  metroNIDAZOLE (FLAGYL) 500 MG tablet Take 500 mg by mouth 3 (three) times daily. Urology - 7 day prescription started 8/27  Patient not taking: Reported on 03/05/2023    [provider]  oxyCODONE-acetaminophen (PERCOCET/ROXICET) 5-325 MG tablet Take 1 tablet by mouth every 8 (eight) hours as needed for severe pain. Patient not taking: Reported on 03/05/2023 01/29/23   Si Gaul, MD    Physical Exam: BP 123/73 (BP Location: Right Arm)   Pulse 69   Temp 98.3 F (36.8 C) (Oral)   Resp 17   Ht 5\' 9"  (1.753 m)   Wt 70.8 kg   SpO2 98%   BMI 23.04 kg/m   General:  Alert, oriented, calm, in no acute distress, looks comfortable, his wife is at the bedside. Eyes: EOMI, clear conjuctivae, white sclerea Neck: supple, no masses, trachea mildline  Cardiovascular: RRR, no murmurs or rubs, no peripheral edema  Respiratory: clear to auscultation bilaterally, no wheezes, no crackles  Abdomen: soft, nontender, nondistended, normal bowel tones heard  Skin: dry, no rashes  Musculoskeletal: no joint effusions, normal range of motion  Psychiatric: appropriate affect, normal speech  Neurologic: extraocular muscles intact, clear speech,  moving all extremities with intact sensorium         Labs on Admission:  Basic Metabolic Panel: Recent Labs  Lab 03/05/23 1013  NA 137  K 4.0  CL 97*  CO2 33*  GLUCOSE 99  BUN 17  CREATININE 1.03  CALCIUM 10.2   Liver Function Tests: Recent Labs  Lab 03/05/23 1013  AST 14*  ALT 11  ALKPHOS 70  BILITOT 0.7  PROT 7.4  ALBUMIN 3.7   No results for input(s): "LIPASE", "AMYLASE" in the last 168 hours. No results for input(s): "AMMONIA" in the last 168 hours. CBC: Recent Labs  Lab 03/05/23 1013  WBC 10.3  NEUTROABS 8.8*  HGB 12.7*  HCT 39.9  MCV 98.5  PLT 273   Cardiac Enzymes: No results for input(s): "CKTOTAL", "CKMB", "CKMBINDEX", "TROPONINI" in the last 168 hours.  BNP (last 3 results) No results for input(s): "BNP" in the last 8760 hours.  ProBNP (last 3 results) No results for input(s): "PROBNP" in the last 8760 hours.  CBG: No results for input(s): "GLUCAP" in the last 168 hours.  Radiological Exams on Admission: CT PELVIS W CONTRAST  Result Date: 03/05/2023 CLINICAL DATA:  fistula, anorectal  abscess EXAM: CT PELVIS WITH CONTRAST TECHNIQUE: Multidetector CT imaging of the pelvis was performed using the standard protocol following the bolus administration of intravenous contrast. RADIATION DOSE REDUCTION: This exam was performed according to the departmental dose-optimization program which includes automated exposure control, adjustment of the mA and/or kV according to patient size and/or use of iterative reconstruction technique. CONTRAST:  80mL OMNIPAQUE IOHEXOL 300 MG/ML  SOLN COMPARISON:  MRI from 02/28/2023. FINDINGS: Urinary Tract: There is a 1.7 x 2.1 cm simple cyst in the right kidney lower pole. Unremarkable urinary bladder. Bowel: No disproportionate dilation of the visualized small or large bowel loops. No evidence of abnormal bowel wall thickening or inflammatory changes. The appendix is unremarkable. Vascular/Lymphatic: No ascites or  pneumoperitoneum. No pelvic lymphadenopathy, by size criteria. No aneurysmal dilation of the major arteries. There are mild peripheral atherosclerotic vascular calcifications of the aorta and its major branches. Reproductive: Prostate gland with radiation fiducial markers noted. Other: There is small amount of free fluid in the dependent pelvis, which is abnormal in the patient of this age group. However, overall, there is interval decrease in the amount of fluid when compared to the recent MRI. There is small amount of air along the left posteroinferior aspect of the  prostate gland, which appears similar to the recent MRI pelvis. However, distinct fistulous tract is not seen on this exam. Musculoskeletal: No suspicious osseous lesions. There are mild multilevel degenerative changes in the visualized spine. IMPRESSION: 1. Small amount of free fluid in the dependent pelvis, which is abnormal in the patient of this age group. However, overall, there is interval decrease in the amount of fluid when compared to the recent MRI from 02/28/2023. 2. Small amount of air along the left posteroinferior aspect of the prostate gland, which appears similar to the recent MRI pelvis. Aortic Atherosclerosis (ICD10-I70.0). Electronically Signed   By: Jules Schick M.D.   On: 03/05/2023 15:23    Assessment/Plan Mathew [name] is a 62 y.o. 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  He is stable, not septic. -Inpatient admission -Soft low residue diet -Empiric IV Flagyl and cefepime -ER provider discussed with general surgery Dr. 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  Currently on Temodar and Avastin. -Dr. Arbutus Ped added to inpatient treatment team  Peripheral Neuropathy - Gabapentin  DVT prophylaxis: Lovenox     Code Status: Full Code  Consults called: General Surgery, oncology added to treatment team  Admission status: The appropriate patient status for this patient is INPATIENT. Inpatient status is judged to be reasonable and necessary in order to provide the required intensity of service to ensure the patient's safety. The patient's presenting symptoms, physical exam findings, and initial radiographic and laboratory data in the context of their chronic comorbidities is felt to place them at high risk for further clinical deterioration. Furthermore, it is not anticipated that the patient will be medically stable for discharge from the hospital within 2 midnights of admission.    I certify that at the point of admission it is my clinical judgment that the patient will require inpatient hospital care spanning beyond 2 midnights from the point of admission due to high intensity of service, high risk for further deterioration and high frequency of surveillance required  Time spent: 56 minutes  Tashala Cumbo Sharlette Dense MD [facility] Pager [phone]  If 7PM-7AM, please contact night-coverage [email or web address] Password [email or web address]  03/05/2023, 8:10 PM

## 2023-03-05 NOTE — ED Notes (Signed)
Called [facility] for transport, bed assignment is ready

## 2023-03-05 NOTE — Hospital Course (Signed)
62 year old male with prostate cancer, recurrent solitary fibrous tumor of the right lung in 2017 and the recurrence in 2021  s/p resection, currently following w/ Dr Shirline Frees and oncology at [facility] on Temodar and Avastin, and on radiation treatment for prostate ca who is having rectal discomfort x 8 weeks, and also had rectal bleeding, recently had a pelvic MRI 9/14: showing fistulous communication between the distal rectum spaceOAR consistent with rectal perforation related to the hydrogel spacer, Dr Kathrynn Running discussed w/ Dr. Cliffton Asters of general surgery and with [facility] oncology and chemo was kept on hold for 2 to 4 weeks to promote rectal healing, completed course of cipro/flagyl, was also on prednisone completed 8/19 In ED: CT pelvis in the ED small amount of free fluid in the pelvis, interval decrease in the amount of fluid compared to MRI from 9/14, small amount of free air along the left posterior inferior aspect of the prostate gland similar to recent MRI. EDP discussed w/ Gen Surgery Dr Cliffton Asters and admission requested.given CEFEPIME+ FlAGYL, morphine and admission requested FOR ANORECTAL FISTULA. Vitals stable afebrile, labs shows stable CMP CBC.

## 2023-03-06 ENCOUNTER — Inpatient Hospital Stay: Payer: BC Managed Care – PPO | Admitting: Nurse Practitioner

## 2023-03-06 DIAGNOSIS — K605 Anorectal fistula: Secondary | ICD-10-CM | POA: Diagnosis not present

## 2023-03-06 LAB — CBC
HCT: 37.7 % — ABNORMAL LOW (ref 39.0–52.0)
Hemoglobin: 11.7 g/dL — ABNORMAL LOW (ref 13.0–17.0)
MCH: 30.5 pg (ref 26.0–34.0)
MCHC: 31 g/dL (ref 30.0–36.0)
MCV: 98.4 fL (ref 80.0–100.0)
Platelets: 246 10*3/uL (ref 150–400)
RBC: 3.83 MIL/uL — ABNORMAL LOW (ref 4.22–5.81)
RDW: 13.4 % (ref 11.5–15.5)
WBC: 8.4 10*3/uL (ref 4.0–10.5)
nRBC: 0 % (ref 0.0–0.2)

## 2023-03-06 LAB — BASIC METABOLIC PANEL
Anion gap: 8 (ref 5–15)
BUN: 17 mg/dL (ref 8–23)
CO2: 28 mmol/L (ref 22–32)
Calcium: 9.1 mg/dL (ref 8.9–10.3)
Chloride: 100 mmol/L (ref 98–111)
Creatinine, Ser: 0.85 mg/dL (ref 0.61–1.24)
GFR, Estimated: >60 mL/min (ref >60)
Glucose, Bld: 105 mg/dL — ABNORMAL HIGH (ref 70–99)
Potassium: 3.9 mmol/L (ref 3.5–5.1)
Sodium: 136 mmol/L (ref 135–145)

## 2023-03-06 LAB — HIV ANTIBODY (ROUTINE TESTING W REFLEX): HIV Screen 4th Generation wRfx: NONREACTIVE

## 2023-03-06 MED ORDER — DOCUSATE SODIUM 100 MG PO CAPS
100.0000 mg | ORAL_CAPSULE | Freq: Every day | ORAL | Status: DC
  Administered 2023-03-06: 100 mg via ORAL
  Filled 2023-03-06: qty 1

## 2023-03-06 MED ORDER — TAMSULOSIN HCL 0.4 MG PO CAPS
0.4000 mg | ORAL_CAPSULE | Freq: Every day | ORAL | Status: DC
  Administered 2023-03-06 – 2023-03-08 (×3): 0.4 mg via ORAL
  Filled 2023-03-06 (×3): qty 1

## 2023-03-06 MED ORDER — FOLIC ACID 1 MG PO TABS
500.0000 ug | ORAL_TABLET | Freq: Every day | ORAL | Status: DC
  Administered 2023-03-06 – 2023-03-09 (×4): 0.5 mg via ORAL
  Filled 2023-03-06 (×4): qty 1

## 2023-03-06 NOTE — Plan of Care (Signed)
  Problem: Education: Goal: Knowledge of General Education information will improve Description: Including pain rating scale, medication(s)/side effects and non-pharmacologic comfort measures Outcome: Progressing   Problem: Clinical Measurements: Goal: Ability to maintain clinical measurements within normal limits will improve Outcome: Progressing Goal: Will remain free from infection Outcome: Progressing   Problem: Nutrition: Goal: Adequate nutrition will be maintained Outcome: Progressing   Problem: Pain Managment: Goal: General experience of comfort will improve Outcome: Progressing   Problem: Safety: Goal: Ability to remain free from injury will improve Outcome: Progressing   Problem: Skin Integrity: Goal: Risk for impaired skin integrity will decrease Outcome: Progressing

## 2023-03-06 NOTE — Consult Note (Signed)
Reason for Consult:Mathew [name] Referring Physician: Dr. Carleene Cooper is an 62 y.o. male.  HPI: The patient is a 62 year old male who has a history of metastatic lung cancer and prostate cancer.  His prostate was recently treated with radiation seeds.  Since that time he has been having pretty significant Mathew [name].  He states his baseline [name] is about 8 out of 10 and it gets worse when he tries to have a bowel movement.  He has had a CT scan of the pelvis which does show some small dots of air around the prostate likely from the radiation seeds.  His recent scan showed no evidence of proctitis.  I discussed the situation with Dr. 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  He is also getting Avastin.     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   OA (osteoarthritis)    Rash    10-15-2022  per pt due to chemo   Shortness of breath dyspnea    just initially   Vitamin D deficiency    Wears hearing aid in both ears     Past Surgical History:  Procedure Laterality Date   GOLD SEED IMPLANT N/A 10/21/2022   Procedure: GOLD SEED IMPLANT;  Surgeon: Noel Christmas, MD;  Location: [facility];  Service: Urology;  Laterality: N/A;   INTERCOSTAL NERVE BLOCK Right 04/27/2020   Procedure: INTERCOSTAL NERVE BLOCK;  Surgeon: Loreli Slot, MD;  Location: [facility] OR;  Service: Thoracic;  Laterality: Right;   KNEE CARTILAGE SURGERY Left 1976   RESECTION OF MEDIASTINAL MASS Right 01/23/2016   @[facility]  by dr Dorris Fetch;   resection chest wall mass w/ en bloc wedge resection of RLL   RESECTION OF MEDIASTINAL MASS Right 11/13/2021   Procedure: RESECTION OF PLEURAL TUMORS;  Surgeon: Loreli Slot, MD;  Location: [facility] OR;  Service: Thoracic;  Laterality: Right;   SPACE OAR INSTILLATION N/A 10/21/2022   Procedure: SPACE OAR INSTILLATION;  Surgeon: Noel Christmas, MD;  Location: [facility];  Service: Urology;  Laterality: N/A;   THORACOTOMY Right 04/27/2020   Procedure: THORACOTOMY;  Surgeon: Loreli Slot, MD;  Location: [facility] OR;  Service: Thoracic;  Laterality: Right;   THORACOTOMY Right 11/13/2021   Procedure: REDO THORACOTOMY;  Surgeon: Loreli Slot, MD;  Location: [facility] OR;  Service: Thoracic;  Laterality: Right;   TONSILLECTOMY     child   VIDEO BRONCHOSCOPY WITH ENDOBRONCHIAL ULTRASOUND N/A 12/17/2015   Procedure: VIDEO BRONCHOSCOPY WITH ENDOBRONCHIAL ULTRASOUND;  Surgeon: Loreli Slot, MD;  Location: [facility] OR;  Service: Thoracic;  Laterality: N/A;    Family History  Problem Relation Age of Onset   Diabetes type II Mother  Hypertension Father     Social History:  reports that he has never smoked. He quit smokeless tobacco use about 46 years ago.  His smokeless tobacco use included chew. He reports that he does not currently use alcohol. He reports that he does not currently use drugs after having used the following drugs: Marijuana.  Allergies:  Allergies  Allergen Reactions   Aspirin Shortness Of Breath    Other Reaction(s): Wheezing   Penicillins Rash    Has patient had a PCN reaction causing immediate rash, facial/tongue/throat swelling, SOB or lightheadedness with hypotension:YES Has patient had a PCN reaction causing severe rash involving mucus membranes or skin necrosis: NO Has  patient had a PCN reaction that required hospitalization NO Has patient had a PCN reaction occurring within the last 10 years: NO If all of the above answers are "NO", then may proceed with Cephalosporin use.    Medications: I have reviewed the patient's current medications.  Results for orders placed or performed during the hospital encounter of 03/05/23 (from the past 48 hour(s))  Lactic acid, plasma     Status: None   Collection Time: 03/05/23 10:13 AM  Result Value Ref Range   Lactic Acid, Venous 0.9 0.5 - 1.9 mmol/L    Comment: Performed at [facility], [address] [place], [place] [address]  Comprehensive metabolic panel     Status: Abnormal   Collection Time: 03/05/23 10:13 AM  Result Value Ref Range   Sodium 137 135 - 145 mmol/L   Potassium 4.0 3.5 - 5.1 mmol/L   Chloride 97 (L) 98 - 111 mmol/L   CO2 33 (H) 22 - 32 mmol/L   Glucose, Bld 99 70 - 99 mg/dL    Comment: Glucose reference range applies only to samples taken after fasting for at least 8 hours.   BUN 17 8 - 23 mg/dL   Creatinine, Ser 6.26 0.61 - 1.24 mg/dL   Calcium 94.8 8.9 - 54.6 mg/dL   Total Protein 7.4 6.5 - 8.1 g/dL   Albumin 3.7 3.5 - 5.0 g/dL   AST 14 (L) 15 - 41 U/L   ALT 11 0 - 44 U/L   Alkaline Phosphatase 70 38 - 126 U/L   Total Bilirubin 0.7 0.3 - 1.2 mg/dL   GFR, Estimated >27 >03 mL/min    Comment: (NOTE) Calculated using the CKD-EPI Creatinine Equation (2021)    Anion gap 7 5 - 15    Comment: Performed at [facility], [address], [place], [place] [address]  CBC with Differential     Status: Abnormal   Collection Time: 03/05/23 10:13 AM  Result Value Ref Range   WBC 10.3 4.0 - 10.5 K/uL   RBC 4.05 (L) 4.22 - 5.81 MIL/uL   Hemoglobin 12.7 (L) 13.0 - 17.0 g/dL   HCT 81.8 29.9 - 37.1 %   MCV 98.5 80.0 - 100.0 fL   MCH 31.4 26.0 - 34.0 pg   MCHC 31.8 30.0 - 36.0 g/dL   RDW 69.6 78.9 - 38.1 %   Platelets 273 150 - 400 K/uL   nRBC 0.0 0.0 -  0.2 %   Neutrophils Relative % 85 %   Neutro Abs 8.8 (H) 1.7 - 7.7 K/uL   Lymphocytes Relative 4 %   Lymphs Abs 0.4 (L) 0.7 - 4.0 K/uL   Monocytes Relative 8 %   Monocytes Absolute 0.8 0.1 - 1.0 K/uL   Eosinophils Relative 2 %   Eosinophils Absolute 0.2 0.0 -  0.5 K/uL   Basophils Relative 0 %   Basophils Absolute 0.0 0.0 - 0.1 K/uL   Immature Granulocytes 1 %   Abs Immature Granulocytes 0.06 0.00 - 0.07 K/uL    Comment: Performed at [facility], [address], [place], [place] [address]  Basic metabolic panel     Status: Abnormal   Collection Time: 03/06/23  5:47 AM  Result Value Ref Range   Sodium 136 135 - 145 mmol/L   Potassium 3.9 3.5 - 5.1 mmol/L   Chloride 100 98 - 111 mmol/L   CO2 28 22 - 32 mmol/L   Glucose, Bld 105 (H) 70 - 99 mg/dL    Comment: Glucose reference range applies only to samples taken after fasting for at least 8 hours.   BUN 17 8 - 23 mg/dL   Creatinine, Ser 6.04 0.61 - 1.24 mg/dL   Calcium 9.1 8.9 - 54.0 mg/dL   GFR, Estimated >98 >11 mL/min    Comment: (NOTE) Calculated using the CKD-EPI Creatinine Equation (2021)    Anion gap 8 5 - 15    Comment: Performed at [facility], [address], [place], [place] [address]  CBC     Status: Abnormal   Collection Time: 03/06/23  5:47 AM  Result Value Ref Range   WBC 8.4 4.0 - 10.5 K/uL   RBC 3.83 (L) 4.22 - 5.81 MIL/uL   Hemoglobin 11.7 (L) 13.0 - 17.0 g/dL   HCT 29.5 (L) 62.1 - 30.8 %   MCV 98.4 80.0 - 100.0 fL   MCH 30.5 26.0 - 34.0 pg   MCHC 31.0 30.0 - 36.0 g/dL   RDW 65.7 84.6 - 96.2 %   Platelets 246 150 - 400 K/uL   nRBC 0.0 0.0 - 0.2 %    Comment: Performed at [facility], [address] [place], [place] [address]    CT PELVIS W CONTRAST  Result Date: 03/05/2023 CLINICAL DATA:  fistula, anorectal  abscess EXAM: CT PELVIS WITH CONTRAST TECHNIQUE: Multidetector CT imaging of the pelvis was performed using the standard protocol  following the bolus administration of intravenous contrast. RADIATION DOSE REDUCTION: This exam was performed according to the departmental dose-optimization program which includes automated exposure control, adjustment of the mA and/or kV according to patient size and/or use of iterative reconstruction technique. CONTRAST:  80mL OMNIPAQUE IOHEXOL 300 MG/ML  SOLN COMPARISON:  MRI from 02/28/2023. FINDINGS: Urinary Tract: There is a 1.7 x 2.1 cm simple cyst in the right kidney lower pole. Unremarkable urinary bladder. Bowel: No disproportionate dilation of the visualized small or large bowel loops. No evidence of abnormal bowel wall thickening or inflammatory changes. The appendix is unremarkable. Vascular/Lymphatic: No ascites or pneumoperitoneum. No pelvic lymphadenopathy, by size criteria. No aneurysmal dilation of the major arteries. There are mild peripheral atherosclerotic vascular calcifications of the aorta and its major branches. Reproductive: Prostate gland with radiation fiducial markers noted. Other: There is small amount of free fluid in the dependent pelvis, which is abnormal in the patient of this age group. However, overall, there is interval decrease in the amount of fluid when compared to the recent MRI. There is small amount of air along the left posteroinferior aspect of the prostate gland, which appears similar to the recent MRI pelvis. However, distinct fistulous tract is not seen on this exam. Musculoskeletal: No suspicious osseous lesions. There are mild multilevel degenerative changes in the visualized spine. IMPRESSION: 1. Small amount of free fluid in the dependent  pelvis, which is abnormal in the patient of this age group. However, overall, there is interval decrease in the amount of fluid when compared to the recent MRI from 02/28/2023. 2. Small amount of air along the left posteroinferior aspect of the prostate gland, which appears similar to the recent MRI pelvis. Aortic Atherosclerosis  (ICD10-I70.0). Electronically Signed   By: Jules Schick M.D.   On: 03/05/2023 15:23    Review of Systems  Constitutional: Negative.   HENT: Negative.    Eyes: Negative.   Respiratory: Negative.    Cardiovascular: Negative.   Gastrointestinal:  Positive for Mathew [name].  Endocrine: Negative.   Genitourinary: Negative.   Musculoskeletal: Negative.   Skin: Negative.   Allergic/Immunologic: Negative.   Neurological: Negative.   Hematological: Negative.   Psychiatric/Behavioral: Negative.     Blood pressure 114/74, pulse 95, temperature 98.6 F (37 C), temperature source Oral, resp. rate 18, height 5\' 9"  (1.753 m), weight 70.8 kg, SpO2 97%. Physical Exam Vitals reviewed.  Constitutional:      General: He is not in acute distress.    Appearance: Normal appearance. He is normal weight.  HENT:     Head: Normocephalic and atraumatic.     Right Ear: External ear normal.     Left Ear: External ear normal.     Nose: Nose normal.     Mouth/Throat:     Mouth: Mucous membranes are moist.     Pharynx: Oropharynx is clear.  Eyes:     General: No scleral icterus.    Extraocular Movements: Extraocular movements intact.     Conjunctiva/sclera: Conjunctivae normal.     Pupils: Pupils are equal, round, and reactive to light.  Cardiovascular:     Rate and Rhythm: Normal rate and regular rhythm.     Pulses: Normal pulses.     Heart sounds: Normal heart sounds.  Abdominal:     General: Abdomen is flat. Bowel sounds are normal.     Palpations: Abdomen is soft.     Comments: There is some Mathew discomfort to palpation. Slight fullness of right perirectal space compared to the left. No external skin changes  Musculoskeletal:        General: No swelling or deformity. Normal range of motion.     Cervical back: Normal range of motion and neck supple. No rigidity.  Skin:    General: Skin is warm and dry.     Coloration: Skin is not jaundiced.  Neurological:     General: No focal deficit  present.     Mental Status: He is alert and oriented to person, place, and time.  Psychiatric:        Mood and Affect: Mood normal.        Behavior: Behavior normal.     Assessment/Plan: With his recent chemotherapy and radiation therapy it is unlikely that any fistula to the rectum would heal.  I would agree with treating him with broad-spectrum antibiotic therapy.  The only thing we could offer him surgically at this point would be diversion with a colostomy but I do not think we are at that point yet that he needs this.  We will follow him closely with you.  Chevis Pretty III 03/06/2023, 8:27 AM

## 2023-03-06 NOTE — Progress Notes (Signed)
Mobility Specialist - Progress Note   03/06/23 1412  Mobility  Activity Ambulated independently in hallway;Off unit  Level of Assistance Independent  Assistive Device None  Distance Ambulated (ft) 500 ft  Activity Response Tolerated well  Mobility Referral Yes  $Mobility charge 1 Mobility  Mobility Specialist Start Time (ACUTE ONLY) 0131  Mobility Specialist Stop Time (ACUTE ONLY) 0212  Mobility Specialist Time Calculation (min) (ACUTE ONLY) 41 min   Pt received in bed and agreeable to go outside. No complaints during session. Pt to bed after session with all needs met.    [facility]

## 2023-03-06 NOTE — Progress Notes (Signed)
PROGRESS NOTE    [name]  ZOX:[id number] DOB: [birth year] DOA: 03/05/2023 PCP: [name], MD    Brief Narrative:  Mathew [name] is a 62 y.o. 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  He is stable, not septic. -bowel regimen -Soft low residue diet -Empiric IV Flagyl and cefepime -GS consult: I would agree with treating him with broad-spectrum antibiotic therapy. The only thing we could offer him surgically at this point would be diversion with a colostomy but I do not think we are at that point yet that he needs this. We will follow him closely with you.    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  Currently on Temodar and Avastin. -Dr. Arbutus Ped added to inpatient treatment team   Peripheral Neuropathy  - Gabapentin   DVT prophylaxis: enoxaparin (LOVENOX) injection 40 mg Start: 03/05/23 2200 SCDs Start: 03/05/23 2009    Code Status: Full Code   Disposition Plan:  Level of care: Med-Surg Status is: Inpatient     Consultants:  GS   Subjective: No SOB, has been up walking  Objective: Vitals:   03/05/23 1954 03/05/23 2347 03/06/23 0347 03/06/23 0813  BP: 123/73 118/74 118/77 114/74  Pulse: 69 75 81 95  Resp: 17 18 18 18   Temp: 98.3 F (36.8 C) 98.8 F (37.1 C) 98.6 F (37 C)   TempSrc: Oral Oral Oral   SpO2: 98% 97% 98% 97%  Weight:      Height:       No intake or output data in the 24 hours ending 03/06/23 1125 Filed Weights   03/05/23 1002  Weight: 70.8 kg    Examination:   General: Appearance:    Well  developed, well nourished male in no acute distress     Lungs:     respirations unlabored  Heart:    Normal heart rate. Normal rhythm. No murmurs, rubs, or gallops.    MS:   All extremities are intact.    Neurologic:   Awake, alert, oriented x 3. No apparent focal neurological           defect.        Data Reviewed: I have personally reviewed following labs and imaging studies  CBC: Recent Labs  Lab 03/05/23 1013 03/06/23 0547  WBC 10.3 8.4  NEUTROABS 8.8*  --   HGB 12.7* 11.7*  HCT 39.9 37.7*  MCV 98.5 98.4  PLT 273 246   Basic Metabolic Panel: Recent Labs  Lab 03/05/23 1013 03/06/23 0547  NA 137 136  K 4.0 3.9  CL 97* 100  CO2 33* 28  GLUCOSE 99 105*  BUN 17 17  CREATININE 1.03 0.85  CALCIUM 10.2 9.1   GFR: Estimated Creatinine Clearance: 87.8 mL/min (by C-G formula based on SCr of 0.85 mg/dL). Liver Function Tests: Recent Labs  Lab 03/05/23 1013  AST 14*  ALT 11  ALKPHOS 70  BILITOT 0.7  PROT 7.4  ALBUMIN 3.7   No results for input(s): "LIPASE", "AMYLASE" in the last 168 hours. No results for input(s): "AMMONIA" in the last 168 hours. Coagulation Profile: No results for input(s): "  INR", "PROTIME" in the last 168 hours. Cardiac Enzymes: No results for input(s): "CKTOTAL", "CKMB", "CKMBINDEX", "TROPONINI" in the last 168 hours. BNP (last 3 results) No results for input(s): "PROBNP" in the last 8760 hours. HbA1C: No results for input(s): "HGBA1C" in the last 72 hours. CBG: No results for input(s): "GLUCAP" in the last 168 hours. Lipid Profile: No results for input(s): "CHOL", "HDL", "LDLCALC", "TRIG", "CHOLHDL", "LDLDIRECT" in the last 72 hours. Thyroid Function Tests: No results for input(s): "TSH", "T4TOTAL", "FREET4", "T3FREE", "THYROIDAB" in the last 72 hours. Anemia Panel: No results for input(s): "VITAMINB12", "FOLATE", "FERRITIN", "TIBC", "IRON", "RETICCTPCT" in the last 72 hours. Sepsis Labs: Recent Labs  Lab 03/05/23 1013   LATICACIDVEN 0.9    No results found for this or any previous visit (from the past 240 hour(s)).       Radiology Studies: CT PELVIS W CONTRAST  Result Date: 03/05/2023 CLINICAL DATA:  fistula, anorectal  abscess EXAM: CT PELVIS WITH CONTRAST TECHNIQUE: Multidetector CT imaging of the pelvis was performed using the standard protocol following the bolus administration of intravenous contrast. RADIATION DOSE REDUCTION: This exam was performed according to the departmental dose-optimization program which includes automated exposure control, adjustment of the mA and/or kV according to patient size and/or use of iterative reconstruction technique. CONTRAST:  80mL OMNIPAQUE IOHEXOL 300 MG/ML  SOLN COMPARISON:  MRI from 02/28/2023. FINDINGS: Urinary Tract: There is a 1.7 x 2.1 cm simple cyst in the right kidney lower pole. Unremarkable urinary bladder. Bowel: No disproportionate dilation of the visualized small or large bowel loops. No evidence of abnormal bowel wall thickening or inflammatory changes. The appendix is unremarkable. Vascular/Lymphatic: No ascites or pneumoperitoneum. No pelvic lymphadenopathy, by size criteria. No aneurysmal dilation of the major arteries. There are mild peripheral atherosclerotic vascular calcifications of the aorta and its major branches. Reproductive: Prostate gland with radiation fiducial markers noted. Other: There is small amount of free fluid in the dependent pelvis, which is abnormal in the patient of this age group. However, overall, there is interval decrease in the amount of fluid when compared to the recent MRI. There is small amount of air along the left posteroinferior aspect of the prostate gland, which appears similar to the recent MRI pelvis. However, distinct fistulous tract is not seen on this exam. Musculoskeletal: No suspicious osseous lesions. There are mild multilevel degenerative changes in the visualized spine. IMPRESSION: 1. Small amount of free fluid  in the dependent pelvis, which is abnormal in the patient of this age group. However, overall, there is interval decrease in the amount of fluid when compared to the recent MRI from 02/28/2023. 2. Small amount of air along the left posteroinferior aspect of the prostate gland, which appears similar to the recent MRI pelvis. Aortic Atherosclerosis (ICD10-I70.0). Electronically Signed   By: Jules Schick M.D.   On: 03/05/2023 15:23        Scheduled Meds:  docusate sodium  100 mg Oral Daily   enoxaparin (LOVENOX) injection  40 mg Subcutaneous Q24H   folic acid  500 mcg Oral Daily   gabapentin  300 mg Oral BID   gabapentin  600 mg Oral QHS   tamsulosin  0.4 mg Oral QPC supper   Continuous Infusions:  ceFEPime (MAXIPIME) IV 2 g (03/06/23 0915)   metronidazole 500 mg (03/06/23 0957)     LOS: 1 day    Time spent: 45 minutes spent on chart review, discussion with nursing staff, consultants, updating family and interview/physical exam; more than 50% of  that time was spent in counseling and/or coordination of care.    Joseph Art, DO [facility] Available via Epic secure chat 7am-7pm After these hours, please refer to coverage provider listed on [email or web address] 03/06/2023, 11:25 AM

## 2023-03-06 NOTE — Progress Notes (Signed)
   03/06/23 1312  TOC Brief Assessment  Insurance and Status Reviewed  Patient has primary care physician Yes  Home environment has been reviewed Home  Prior level of function: Independent  Prior/Current Home Services No current home services  Social Determinants of Health Reivew SDOH reviewed no interventions necessary  Readmission risk has been reviewed Yes  Transition of care needs no transition of care needs at this time

## 2023-03-07 DIAGNOSIS — K605 Anorectal fistula: Secondary | ICD-10-CM | POA: Diagnosis not present

## 2023-03-07 LAB — CBC
HCT: 36.8 % — ABNORMAL LOW (ref 39.0–52.0)
Hemoglobin: 11.6 g/dL — ABNORMAL LOW (ref 13.0–17.0)
MCH: 31.6 pg (ref 26.0–34.0)
MCHC: 31.5 g/dL (ref 30.0–36.0)
MCV: 100.3 fL — ABNORMAL HIGH (ref 80.0–100.0)
Platelets: 216 10*3/uL (ref 150–400)
RBC: 3.67 MIL/uL — ABNORMAL LOW (ref 4.22–5.81)
RDW: 13.2 % (ref 11.5–15.5)
WBC: 8.6 10*3/uL (ref 4.0–10.5)
nRBC: 0 % (ref 0.0–0.2)

## 2023-03-07 LAB — BASIC METABOLIC PANEL
Anion gap: 8 (ref 5–15)
BUN: 17 mg/dL (ref 8–23)
CO2: 27 mmol/L (ref 22–32)
Calcium: 8.9 mg/dL (ref 8.9–10.3)
Chloride: 99 mmol/L (ref 98–111)
Creatinine, Ser: 1.08 mg/dL (ref 0.61–1.24)
GFR, Estimated: >60 mL/min (ref >60)
Glucose, Bld: 97 mg/dL (ref 70–99)
Potassium: 3.6 mmol/L (ref 3.5–5.1)
Sodium: 134 mmol/L — ABNORMAL LOW (ref 135–145)

## 2023-03-07 MED ORDER — POLYETHYLENE GLYCOL 3350 17 G PO PACK
17.0000 g | PACK | Freq: Two times a day (BID) | ORAL | Status: DC
  Administered 2023-03-07 – 2023-03-09 (×4): 17 g via ORAL
  Filled 2023-03-07 (×5): qty 1

## 2023-03-07 NOTE — Plan of Care (Signed)

## 2023-03-07 NOTE — Progress Notes (Signed)
PROGRESS NOTE    Mathew [name]  DGU:[id number] DOB: Dec 07, 1960 DOA: 03/05/2023 PCP: [name], MD    Brief Narrative:  Mathew [name] is a 62 y.o. 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  He is stable, not septic. -bowel regimen -Empiric IV Flagyl and cefepime -ambulate -GS consult: I would agree with treating him with broad-spectrum antibiotic therapy. The only thing we could offer him surgically at this point would be diversion with a colostomy but I do not think we are at that point yet that he needs this. 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  Currently on Temodar and Avastin. -Dr. Arbutus Ped added to inpatient treatment team   Peripheral Neuropathy  - Gabapentin   DVT prophylaxis: enoxaparin (LOVENOX) injection 40 mg Start: 03/05/23 2200 SCDs Start: 03/05/23 2009    Code Status: Full Code   Disposition Plan:  Level of care: Med-Surg Status is: Inpatient     Consultants:  GS   Subjective: sleeping  Objective: Vitals:   03/06/23 1225 03/06/23 1836 03/06/23 1954 03/07/23 0438  BP: 124/81 121/79 121/87 133/89  Pulse: 82 91 95 91  Resp: 17  16 16   Temp: 98.8 F (37.1 C) 100.2 F (37.9 C) 99.9 F (37.7 C) 99.4 F (37.4 C)  TempSrc: Oral Oral Oral Oral  SpO2: 98% 97% 98% 97%  Weight:      Height:        Intake/Output Summary (Last 24 hours) at 03/07/2023 1127 Last data filed at 03/06/2023 2152 Gross per 24 hour  Intake 838.14 ml  Output --  Net 838.14 ml   Filed Weights   03/05/23 1002   Weight: 70.8 kg    Examination:    General: Appearance:    Well developed, well nourished male in no acute distress     Lungs:     Clear to auscultation bilaterally, respirations unlabored  Heart:    Normal heart rate. Normal rhythm. No murmurs, rubs, or gallops.   MS:   All extremities are intact.   Neurologic:  sleeping     Data Reviewed: I have personally reviewed following labs and imaging studies  CBC: Recent Labs  Lab 03/05/23 1013 03/06/23 0547 03/07/23 0614  WBC 10.3 8.4 8.6  NEUTROABS 8.8*  --   --   HGB 12.7* 11.7* 11.6*  HCT 39.9 37.7* 36.8*  MCV 98.5 98.4 100.3*  PLT 273 246 216   Basic Metabolic Panel: Recent Labs  Lab 03/05/23 1013 03/06/23 0547 03/07/23 0614  NA 137 136 134*  K 4.0 3.9 3.6  CL 97* 100 99  CO2 33* 28 27  GLUCOSE 99 105* 97  BUN 17 17 17   CREATININE 1.03 0.85 1.08  CALCIUM 10.2 9.1 8.9   GFR: Estimated Creatinine Clearance: 69.1 mL/min (by C-G formula based on SCr of 1.08 mg/dL). Liver Function Tests: Recent Labs  Lab 03/05/23 1013  AST 14*  ALT 11  ALKPHOS 70  BILITOT 0.7  PROT 7.4  ALBUMIN 3.7   No results for input(s): "LIPASE", "AMYLASE" in the last 168 hours. No results for input(s): "  AMMONIA" in the last 168 hours. Coagulation Profile: No results for input(s): "INR", "PROTIME" in the last 168 hours. Cardiac Enzymes: No results for input(s): "CKTOTAL", "CKMB", "CKMBINDEX", "TROPONINI" in the last 168 hours. BNP (last 3 results) No results for input(s): "PROBNP" in the last 8760 hours. HbA1C: No results for input(s): "HGBA1C" in the last 72 hours. CBG: No results for input(s): "GLUCAP" in the last 168 hours. Lipid Profile: No results for input(s): "CHOL", "HDL", "LDLCALC", "TRIG", "CHOLHDL", "LDLDIRECT" in the last 72 hours. Thyroid Function Tests: No results for input(s): "TSH", "T4TOTAL", "FREET4", "T3FREE", "THYROIDAB" in the last 72 hours. Anemia Panel: No results for input(s): "VITAMINB12", "FOLATE",  "FERRITIN", "TIBC", "IRON", "RETICCTPCT" in the last 72 hours. Sepsis Labs: Recent Labs  Lab 03/05/23 1013  LATICACIDVEN 0.9    No results found for this or any previous visit (from the past 240 hour(s)).       Radiology Studies: CT PELVIS W CONTRAST  Result Date: 03/05/2023 CLINICAL DATA:  fistula, anorectal  abscess EXAM: CT PELVIS WITH CONTRAST TECHNIQUE: Multidetector CT imaging of the pelvis was performed using the standard protocol following the bolus administration of intravenous contrast. RADIATION DOSE REDUCTION: This exam was performed according to the departmental dose-optimization program which includes automated exposure control, adjustment of the mA and/or kV according to patient size and/or use of iterative reconstruction technique. CONTRAST:  80mL OMNIPAQUE IOHEXOL 300 MG/ML  SOLN COMPARISON:  MRI from 02/28/2023. FINDINGS: Urinary Tract: There is a 1.7 x 2.1 cm simple cyst in the right kidney lower pole. Unremarkable urinary bladder. Bowel: No disproportionate dilation of the visualized small or large bowel loops. No evidence of abnormal bowel wall thickening or inflammatory changes. The appendix is unremarkable. Vascular/Lymphatic: No ascites or pneumoperitoneum. No pelvic lymphadenopathy, by size criteria. No aneurysmal dilation of the major arteries. There are mild peripheral atherosclerotic vascular calcifications of the aorta and its major branches. Reproductive: Prostate gland with radiation fiducial markers noted. Other: There is small amount of free fluid in the dependent pelvis, which is abnormal in the patient of this age group. However, overall, there is interval decrease in the amount of fluid when compared to the recent MRI. There is small amount of air along the left posteroinferior aspect of the prostate gland, which appears similar to the recent MRI pelvis. However, distinct fistulous tract is not seen on this exam. Musculoskeletal: No suspicious osseous lesions.  There are mild multilevel degenerative changes in the visualized spine. IMPRESSION: 1. Small amount of free fluid in the dependent pelvis, which is abnormal in the patient of this age group. However, overall, there is interval decrease in the amount of fluid when compared to the recent MRI from 02/28/2023. 2. Small amount of air along the left posteroinferior aspect of the prostate gland, which appears similar to the recent MRI pelvis. Aortic Atherosclerosis (ICD10-I70.0). Electronically Signed   By: Jules Schick M.D.   On: 03/05/2023 15:23        Scheduled Meds:  enoxaparin (LOVENOX) injection  40 mg Subcutaneous Q24H   folic acid  500 mcg Oral Daily   gabapentin  300 mg Oral BID   gabapentin  600 mg Oral QHS   polyethylene glycol  17 g Oral BID   tamsulosin  0.4 mg Oral QPC supper   Continuous Infusions:  ceFEPime (MAXIPIME) IV 2 g (03/07/23 1044)   metronidazole 500 mg (03/06/23 2154)     LOS: 2 days    Time spent: 45 minutes spent on chart review, discussion with  nursing staff, consultants, updating family and interview/physical exam; more than 50% of that time was spent in counseling and/or coordination of care.    Joseph Art, DO [facility] Available via Epic secure chat 7am-7pm After these hours, please refer to coverage provider listed on [email or web address] 03/07/2023, 11:27 AM

## 2023-03-07 NOTE — Progress Notes (Signed)
Subjective/Chief Complaint: Still with rectal pain and constipation   Objective: Vital signs in last 24 hours: Temp:  [98.8 F (37.1 C)-100.2 F (37.9 C)] 99.4 F (37.4 C) (09/21 0438) Pulse Rate:  [82-95] 91 (09/21 0438) Resp:  [16-17] 16 (09/21 0438) BP: (121-133)/(79-89) 133/89 (09/21 0438) SpO2:  [97 %-98 %] 97 % (09/21 0438) Last BM Date : 03/04/23  Intake/Output from previous day: 09/20 0701 - 09/21 0700 In: 838.1 [P.O.:360; IV Piggyback:478.1] Out: -  Intake/Output this shift: No intake/output data recorded.  Exam: Awake and alert Abdomen soft, NT  Lab Results:  Recent Labs    03/06/23 0547 03/07/23 0614  WBC 8.4 8.6  HGB 11.7* 11.6*  HCT 37.7* 36.8*  PLT 246 216   BMET Recent Labs    03/06/23 0547 03/07/23 0614  NA 136 134*  K 3.9 3.6  CL 100 99  CO2 28 27  GLUCOSE 105* 97  BUN 17 17  CREATININE 0.85 1.08  CALCIUM 9.1 8.9   PT/INR No results for input(s): "LABPROT", "INR" in the last 72 hours. ABG No results for input(s): "PHART", "HCO3" in the last 72 hours.  Invalid input(s): "PCO2", "PO2"  Studies/Results: CT PELVIS W CONTRAST  Result Date: 03/05/2023 CLINICAL DATA:  fistula, anorectal  abscess EXAM: CT PELVIS WITH CONTRAST TECHNIQUE: Multidetector CT imaging of the pelvis was performed using the standard protocol following the bolus administration of intravenous contrast. RADIATION DOSE REDUCTION: This exam was performed according to the departmental dose-optimization program which includes automated exposure control, adjustment of the mA and/or kV according to patient size and/or use of iterative reconstruction technique. CONTRAST:  80mL OMNIPAQUE IOHEXOL 300 MG/ML  SOLN COMPARISON:  MRI from 02/28/2023. FINDINGS: Urinary Tract: There is a 1.7 x 2.1 cm simple cyst in the right kidney lower pole. Unremarkable urinary bladder. Bowel: No disproportionate dilation of the visualized small or large bowel loops. No evidence of abnormal bowel  wall thickening or inflammatory changes. The appendix is unremarkable. Vascular/Lymphatic: No ascites or pneumoperitoneum. No pelvic lymphadenopathy, by size criteria. No aneurysmal dilation of the major arteries. There are mild peripheral atherosclerotic vascular calcifications of the aorta and its major branches. Reproductive: Prostate gland with radiation fiducial markers noted. Other: There is small amount of free fluid in the dependent pelvis, which is abnormal in the patient of this age group. However, overall, there is interval decrease in the amount of fluid when compared to the recent MRI. There is small amount of air along the left posteroinferior aspect of the prostate gland, which appears similar to the recent MRI pelvis. However, distinct fistulous tract is not seen on this exam. Musculoskeletal: No suspicious osseous lesions. There are mild multilevel degenerative changes in the visualized spine. IMPRESSION: 1. Small amount of free fluid in the dependent pelvis, which is abnormal in the patient of this age group. However, overall, there is interval decrease in the amount of fluid when compared to the recent MRI from 02/28/2023. 2. Small amount of air along the left posteroinferior aspect of the prostate gland, which appears similar to the recent MRI pelvis. Aortic Atherosclerosis (ICD10-I70.0). Electronically Signed   By: Jules Schick M.D.   On: 03/05/2023 15:23    Anti-infectives: Anti-infectives (From admission, onward)    Start     Dose/Rate Route Frequency Ordered Stop   03/06/23 0100  ceFEPIme (MAXIPIME) 2 g in sodium chloride 0.9 % 100 mL IVPB        2 g 200 mL/hr over 30 Minutes Intravenous Every  8 hours 03/05/23 1644     03/05/23 1645  ceFEPIme (MAXIPIME) 2 g in sodium chloride 0.9 % 100 mL IVPB  Status:  Discontinued        2 g 200 mL/hr over 30 Minutes Intravenous Every 8 hours 03/05/23 1634 03/05/23 1643   03/05/23 1645  metroNIDAZOLE (FLAGYL) IVPB 500 mg        500 mg 100  mL/hr over 60 Minutes Intravenous Every 12 hours 03/05/23 1634     03/05/23 1600  ceFEPIme (MAXIPIME) 2 g in sodium chloride 0.9 % 100 mL IVPB        2 g 200 mL/hr over 30 Minutes Intravenous  Once 03/05/23 1551 03/05/23 1655   03/05/23 1600  metroNIDAZOLE (FLAGYL) IVPB 500 mg  Status:  Discontinued        500 mg 100 mL/hr over 60 Minutes Intravenous  Once 03/05/23 1551 03/05/23 1643       Assessment/Plan: With his recent chemotherapy and radiation therapy it is unlikely that any fistula to the rectum would heal. I would agree with treating him with broad-spectrum antibiotic therapy. The only thing we could offer him surgically at this point would be diversion with a colostomy but I do not think we are at that point yet that he needs this.   Will change to miralax BID Pt not interested in diversion Will see again Monday   Abigail Miyamoto MD 03/07/2023

## 2023-03-08 DIAGNOSIS — K605 Anorectal fistula: Secondary | ICD-10-CM | POA: Diagnosis not present

## 2023-03-08 LAB — BASIC METABOLIC PANEL
Anion gap: 9 (ref 5–15)
BUN: 16 mg/dL (ref 8–23)
CO2: 27 mmol/L (ref 22–32)
Calcium: 9 mg/dL (ref 8.9–10.3)
Chloride: 101 mmol/L (ref 98–111)
Creatinine, Ser: 0.87 mg/dL (ref 0.61–1.24)
GFR, Estimated: >60 mL/min (ref >60)
Glucose, Bld: 97 mg/dL (ref 70–99)
Potassium: 3.7 mmol/L (ref 3.5–5.1)
Sodium: 137 mmol/L (ref 135–145)

## 2023-03-08 LAB — CBC
HCT: 34.1 % — ABNORMAL LOW (ref 39.0–52.0)
Hemoglobin: 11 g/dL — ABNORMAL LOW (ref 13.0–17.0)
MCH: 31.6 pg (ref 26.0–34.0)
MCHC: 32.3 g/dL (ref 30.0–36.0)
MCV: 98 fL (ref 80.0–100.0)
Platelets: 206 10*3/uL (ref 150–400)
RBC: 3.48 MIL/uL — ABNORMAL LOW (ref 4.22–5.81)
RDW: 13.2 % (ref 11.5–15.5)
WBC: 7.2 10*3/uL (ref 4.0–10.5)
nRBC: 0 % (ref 0.0–0.2)

## 2023-03-08 NOTE — Plan of Care (Signed)

## 2023-03-08 NOTE — Plan of Care (Signed)
Pt is progressing 

## 2023-03-08 NOTE — Progress Notes (Signed)
PROGRESS NOTE    Mathew [name]  VVO:[id number] DOB: 07-14-1960 DOA: 03/05/2023 PCP: [name], MD    Brief Narrative:  Mathew [name] is a 63 y.o. 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 Slow to improve with IV abx.     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  He is stable, not septic. -bowel regimen -Empiric IV Flagyl and cefepime -ambulate -GS consult: I would agree with treating him with broad-spectrum antibiotic therapy. The only thing we could offer him surgically at this point would be diversion with a colostomy but I do not think we are at that point yet that he needs this. 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  Currently on Temodar and Avastin. -Dr. Arbutus Ped added to inpatient treatment team   Peripheral Neuropathy  - Gabapentin   DVT prophylaxis: enoxaparin (LOVENOX) injection 40 mg Start: 03/05/23 2200 SCDs Start: 03/05/23 2009    Code Status: Full Code   Disposition Plan:  Level of care: Med-Surg Status is: Inpatient     Consultants:  GS   Subjective: Still with pain  Objective: Vitals:   03/07/23 0438 03/07/23 1249 03/07/23 1941 03/08/23 0422  BP: 133/89 (!) 141/96 138/73 (!) 141/81  Pulse: 91 95 95 90  Resp: 16 16 16 16   Temp: 99.4 F (37.4 C) 98.9 F (37.2 C) 99.6 F (37.6 C) 99.7 F (37.6 C)  TempSrc: Oral Oral Oral Oral  SpO2: 97% 99% 97% 97%  Weight:      Height:        Intake/Output Summary (Last 24 hours) at 03/08/2023 1203 Last data filed at 03/07/2023 2031 Gross per 24 hour  Intake 120 ml  Output --  Net 120  ml   Filed Weights   03/05/23 1002  Weight: 70.8 kg    Examination:    General: Appearance:    Well developed, well nourished male in no acute distress     Lungs:     Clear to auscultation bilaterally, respirations unlabored  Heart:    Normal heart rate.   MS:   All extremities are intact.   Neurologic:   Awake, alert     Data Reviewed: I have personally reviewed following labs and imaging studies  CBC: Recent Labs  Lab 03/05/23 1013 03/06/23 0547 03/07/23 0614 03/08/23 0551  WBC 10.3 8.4 8.6 7.2  NEUTROABS 8.8*  --   --   --   HGB 12.7* 11.7* 11.6* 11.0*  HCT 39.9 37.7* 36.8* 34.1*  MCV 98.5 98.4 100.3* 98.0  PLT 273 246 216 206   Basic Metabolic Panel: Recent Labs  Lab 03/05/23 1013 03/06/23 0547 03/07/23 0614 03/08/23 0551  NA 137 136 134* 137  K 4.0 3.9 3.6 3.7  CL 97* 100 99 101  CO2 33* 28 27 27   GLUCOSE 99 105* 97 97  BUN 17 17 17 16   CREATININE 1.03 0.85 1.08 0.87  CALCIUM 10.2 9.1 8.9 9.0   GFR: Estimated Creatinine Clearance: 85.8 mL/min (by C-G formula based on SCr of 0.87 mg/dL). Liver Function Tests: Recent Labs  Lab 03/05/23 1013  AST 14*  ALT 11  ALKPHOS 70  BILITOT 0.7  PROT 7.4  ALBUMIN 3.7   No results for input(s): "LIPASE", "AMYLASE" in the last 168 hours. No results for input(s): "AMMONIA" in the last 168 hours. Coagulation Profile: No results for input(s): "INR", "PROTIME" in the last 168 hours. Cardiac Enzymes: No results for input(s): "CKTOTAL", "CKMB", "CKMBINDEX", "TROPONINI" in the last 168 hours. BNP (last 3 results) No results for input(s): "PROBNP" in the last 8760 hours. HbA1C: No results for input(s): "HGBA1C" in the last 72 hours. CBG: No results for input(s): "GLUCAP" in the last 168 hours. Lipid Profile: No results for input(s): "CHOL", "HDL", "LDLCALC", "TRIG", "CHOLHDL", "LDLDIRECT" in the last 72 hours. Thyroid Function Tests: No results for input(s): "TSH", "T4TOTAL", "FREET4", "T3FREE", "THYROIDAB"  in the last 72 hours. Anemia Panel: No results for input(s): "VITAMINB12", "FOLATE", "FERRITIN", "TIBC", "IRON", "RETICCTPCT" in the last 72 hours. Sepsis Labs: Recent Labs  Lab 03/05/23 1013  LATICACIDVEN 0.9    No results found for this or any previous visit (from the past 240 hour(s)).       Radiology Studies: No results found.      Scheduled Meds:  enoxaparin (LOVENOX) injection  40 mg Subcutaneous Q24H   folic acid  500 mcg Oral Daily   gabapentin  300 mg Oral BID   gabapentin  600 mg Oral QHS   polyethylene glycol  17 g Oral BID   tamsulosin  0.4 mg Oral QPC supper   Continuous Infusions:  ceFEPime (MAXIPIME) IV 2 g (03/08/23 0758)   metronidazole 500 mg (03/08/23 0910)     LOS: 3 days    Time spent: 45 minutes spent on chart review, discussion with nursing staff, consultants, updating family and interview/physical exam; more than 50% of that time was spent in counseling and/or coordination of care.    Joseph Art, DO [facility] Available via Epic secure chat 7am-7pm After these hours, please refer to coverage provider listed on [email or web address] 03/08/2023, 12:03 PM

## 2023-03-08 NOTE — Progress Notes (Signed)
Mobility Specialist - Progress Note   03/08/23 1323  Mobility  Activity Ambulated with assistance in hallway;Off unit  Level of Assistance Independent  Assistive Device None  Distance Ambulated (ft) 2000 ft  Range of Motion/Exercises Active  Activity Response Tolerated well  Mobility Referral Yes  $Mobility charge 1 Mobility  Mobility Specialist Start Time (ACUTE ONLY) 1300  Mobility Specialist Stop Time (ACUTE ONLY) 1323  Mobility Specialist Time Calculation (min) (ACUTE ONLY) 23 min   Pt received in hallway and agreed to mobility, met with family in healing garden. Returned to room with no issues.  Marilynne Halsted Mobility Specialist

## 2023-03-09 ENCOUNTER — Encounter: Payer: Self-pay | Admitting: Physician Assistant

## 2023-03-09 ENCOUNTER — Other Ambulatory Visit (HOSPITAL_COMMUNITY): Payer: Self-pay

## 2023-03-09 ENCOUNTER — Other Ambulatory Visit: Payer: BC Managed Care – PPO

## 2023-03-09 DIAGNOSIS — K605 Anorectal fistula: Secondary | ICD-10-CM | POA: Diagnosis not present

## 2023-03-09 LAB — CBC
HCT: 34.7 % — ABNORMAL LOW (ref 39.0–52.0)
Hemoglobin: 11.1 g/dL — ABNORMAL LOW (ref 13.0–17.0)
MCH: 31.4 pg (ref 26.0–34.0)
MCHC: 32 g/dL (ref 30.0–36.0)
MCV: 98 fL (ref 80.0–100.0)
Platelets: 204 10*3/uL (ref 150–400)
RBC: 3.54 MIL/uL — ABNORMAL LOW (ref 4.22–5.81)
RDW: 13.2 % (ref 11.5–15.5)
WBC: 7.6 10*3/uL (ref 4.0–10.5)
nRBC: 0 % (ref 0.0–0.2)

## 2023-03-09 LAB — BASIC METABOLIC PANEL
Anion gap: 8 (ref 5–15)
BUN: 16 mg/dL (ref 8–23)
CO2: 26 mmol/L (ref 22–32)
Calcium: 8.8 mg/dL — ABNORMAL LOW (ref 8.9–10.3)
Chloride: 99 mmol/L (ref 98–111)
Creatinine, Ser: 0.95 mg/dL (ref 0.61–1.24)
GFR, Estimated: >60 mL/min (ref >60)
Glucose, Bld: 100 mg/dL — ABNORMAL HIGH (ref 70–99)
Potassium: 3.8 mmol/L (ref 3.5–5.1)
Sodium: 133 mmol/L — ABNORMAL LOW (ref 135–145)

## 2023-03-09 MED ORDER — POLYETHYLENE GLYCOL 3350 17 G PO PACK: 17.0000 g | PACK | Freq: Two times a day (BID) | ORAL | Status: DC

## 2023-03-09 MED ORDER — OXYCODONE-ACETAMINOPHEN 5-325 MG PO TABS
1.0000 | ORAL_TABLET | Freq: Three times a day (TID) | ORAL | 0 refills | Status: AC | PRN
Start: 2023-03-09 — End: ?
  Filled 2023-03-09: qty 30, 5d supply, fill #0

## 2023-03-09 MED ORDER — METRONIDAZOLE 500 MG PO TABS
500.0000 mg | ORAL_TABLET | Freq: Two times a day (BID) | ORAL | 0 refills | Status: AC
  Filled 2023-03-09: qty 14, 7d supply, fill #0

## 2023-03-09 MED ORDER — OXYCODONE-ACETAMINOPHEN 5-325 MG PO TABS: 1.0000 | ORAL_TABLET | Freq: Three times a day (TID) | ORAL | Status: DC | PRN

## 2023-03-09 MED ORDER — CIPROFLOXACIN HCL 500 MG PO TABS
500.0000 mg | ORAL_TABLET | Freq: Two times a day (BID) | ORAL | 0 refills | Status: DC
  Filled 2023-03-09: qty 14, 7d supply, fill #0

## 2023-03-09 MED ORDER — SENNOSIDES-DOCUSATE SODIUM 8.6-50 MG PO TABS: 1.0000 | ORAL_TABLET | Freq: Every evening | ORAL | Status: DC | PRN

## 2023-03-09 NOTE — Discharge Summary (Signed)
Physician Discharge Summary  Mathew [name]:[id number] DOB: 08-27-1960 DOA: 03/05/2023  PCP: [name], MD  Admit date: 03/05/2023 Discharge date: 03/09/2023  Admitted From: home Discharge disposition: home   Recommendations for Outpatient Follow-Up:   Finish abx Close follow up with Dr. Cliffton Asters Low residue diet Holding chemo for 2-4 weeks to see if healing improved   Discharge Diagnosis:   Principal Problem:   Anorectal fistula    Discharge Condition: Improved.  Diet recommendation: Regular.  Wound care: None.  Code status: Full.   History of Present Illness:   Mathew [name] is a 62 y.o. 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  Patient states that he has been undergoing systemic chemotherapy and has completed 10 cycles, also completed radiation to the prostate this past July.  For the last 8 weeks, he has been experiencing severe rectal pain, without any fevers, has only had some mild intermittent bleeding but nothing significant.  He has been followed closely by his oncologist at [facility], as well as Dr. Arbutus Ped at the [facility] cancer center.  Due to continued rectal pain, he had MRI of the pelvis on 9/14, prior to which he was given a course of oral antibiotics empirically.  His symptoms did not improve, so he was advised by his radiation oncologist to present to [facility] today.  On evaluation in the ER, he has been afebrile his vital signs are unremarkable.  Lab work was done shows stable mild anemia, and otherwise unremarkable.  He had CT scan of the abdomen pelvis as detailed below, was given empiric IV antibiotics and admitted to the hospitalist service at [facility].     Hospital Course by Problem:   Anorectal fistula-concern for this due to his continued rectal pain, and air seen along  the left posterior inferior aspect of the prostate gland.  He is stable, not septic. -bowel regimen -Empiric IV Flagyl and cefepime-- changed to PO abx and treat for 7 days -ambulate -GS consulted fistula unlikely to heal in setting of radiation and recent chemotherapy, Will have him f/u with Dr. 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  Holding chemo for 2-4 weeks -Dr. Arbutus Ped added to inpatient treatment team   Peripheral Neuropathy  - Gabapentin    Medical Consultants:    [facility] (phone)  Discharge Exam:   Vitals:   03/09/23 0627 03/09/23 1258  BP: 123/77 125/72  Pulse: 77 80  Resp: 18 18  Temp: 98.7 F (37.1 C) 99.4 F (37.4 C)  SpO2: 98% 99%   Vitals:   03/08/23 1345 03/08/23 2134 03/09/23 0627 03/09/23 1258  BP: 124/79 114/65 123/77 125/72  Pulse: 84 74 77 80  Resp: 16 18 18 18   Temp: 98.5 F (36.9 C) 98.7 F (37.1 C) 98.7 F (37.1 C) 99.4 F (37.4 C)  TempSrc:  Oral Oral   SpO2: 97% 97% 98% 99%  Weight:      Height:        General exam: Appears calm and comfortable   The results of significant diagnostics from this hospitalization (including imaging, microbiology, ancillary and laboratory) are listed below for reference.     Procedures and Diagnostic  Studies:   CT PELVIS W CONTRAST  Result Date: 03/05/2023 CLINICAL DATA:  fistula, anorectal  abscess EXAM: CT PELVIS WITH CONTRAST TECHNIQUE: Multidetector CT imaging of the pelvis was performed using the standard protocol following the bolus administration of intravenous contrast. RADIATION DOSE REDUCTION: This exam was performed according to the departmental dose-optimization program which includes automated exposure control, adjustment of the mA and/or kV according to patient size and/or use of iterative reconstruction  technique. CONTRAST:  80mL OMNIPAQUE IOHEXOL 300 MG/ML  SOLN COMPARISON:  MRI from 02/28/2023. FINDINGS: Urinary Tract: There is a 1.7 x 2.1 cm simple cyst in the right kidney lower pole. Unremarkable urinary bladder. Bowel: No disproportionate dilation of the visualized small or large bowel loops. No evidence of abnormal bowel wall thickening or inflammatory changes. The appendix is unremarkable. Vascular/Lymphatic: No ascites or pneumoperitoneum. No pelvic lymphadenopathy, by size criteria. No aneurysmal dilation of the major arteries. There are mild peripheral atherosclerotic vascular calcifications of the aorta and its major branches. Reproductive: Prostate gland with radiation fiducial markers noted. Other: There is small amount of free fluid in the dependent pelvis, which is abnormal in the patient of this age group. However, overall, there is interval decrease in the amount of fluid when compared to the recent MRI. There is small amount of air along the left posteroinferior aspect of the prostate gland, which appears similar to the recent MRI pelvis. However, distinct fistulous tract is not seen on this exam. Musculoskeletal: No suspicious osseous lesions. There are mild multilevel degenerative changes in the visualized spine. IMPRESSION: 1. Small amount of free fluid in the dependent pelvis, which is abnormal in the patient of this age group. However, overall, there is interval decrease in the amount of fluid when compared to the recent MRI from 02/28/2023. 2. Small amount of air along the left posteroinferior aspect of the prostate gland, which appears similar to the recent MRI pelvis. Aortic Atherosclerosis (ICD10-I70.0). Electronically Signed   By: Jules Schick M.D.   On: 03/05/2023 15:23     Labs:   Basic Metabolic Panel: Recent Labs  Lab 03/05/23 1013 03/06/23 0547 03/07/23 0614 03/08/23 0551 03/09/23 0526  NA 137 136 134* 137 133*  K 4.0 3.9 3.6 3.7 3.8  CL 97* 100 99 101 99  CO2 33*  28 27 27 26   GLUCOSE 99 105* 97 97 100*  BUN 17 17 17 16 16   CREATININE 1.03 0.85 1.08 0.87 0.95  CALCIUM 10.2 9.1 8.9 9.0 8.8*   GFR Estimated Creatinine Clearance: 78.6 mL/min (by C-G formula based on SCr of 0.95 mg/dL). Liver Function Tests: Recent Labs  Lab 03/05/23 1013  AST 14*  ALT 11  ALKPHOS 70  BILITOT 0.7  PROT 7.4  ALBUMIN 3.7   No results for input(s): "LIPASE", "AMYLASE" in the last 168 hours. No results for input(s): "AMMONIA" in the last 168 hours. Coagulation profile No results for input(s): "INR", "PROTIME" in the last 168 hours.  CBC: Recent Labs  Lab 03/05/23 1013 03/06/23 0547 03/07/23 0614 03/08/23 0551 03/09/23 0526  WBC 10.3 8.4 8.6 7.2 7.6  NEUTROABS 8.8*  --   --   --   --   HGB 12.7* 11.7* 11.6* 11.0* 11.1*  HCT 39.9 37.7* 36.8* 34.1* 34.7*  MCV 98.5 98.4 100.3* 98.0 98.0  PLT 273 246 216 206 204   Cardiac Enzymes: No results for input(s): "CKTOTAL", "CKMB", "CKMBINDEX", "TROPONINI" in the last 168 hours. BNP: Invalid input(s): "POCBNP" CBG: No results for input(s): "GLUCAP" in  the last 168 hours. D-Dimer No results for input(s): "DDIMER" in the last 72 hours. Hgb A1c No results for input(s): "HGBA1C" in the last 72 hours. Lipid Profile No results for input(s): "CHOL", "HDL", "LDLCALC", "TRIG", "CHOLHDL", "LDLDIRECT" in the last 72 hours. Thyroid function studies No results for input(s): "TSH", "T4TOTAL", "T3FREE", "THYROIDAB" in the last 72 hours.  Invalid input(s): "FREET3" Anemia work up No results for input(s): "VITAMINB12", "FOLATE", "FERRITIN", "TIBC", "IRON", "RETICCTPCT" in the last 72 hours. Microbiology No results found for this or any previous visit (from the past 240 hour(s)).   Discharge Instructions:   Discharge Instructions     Discharge instructions   Complete by: As directed    Low residue/fiber diet Bowel regimen to keep stools soft Follow up with Dr. Cliffton Asters (office should be calling)   Increase activity  slowly   Complete by: As directed       Allergies as of 03/09/2023       Reactions   Aspirin Shortness Of Breath   Other Reaction(s): Wheezing   Penicillins Rash   Has patient had a PCN reaction causing immediate rash, facial/tongue/throat swelling, SOB or lightheadedness with hypotension:YES Has patient had a PCN reaction causing severe rash involving mucus membranes or skin necrosis: NO Has patient had a PCN reaction that required hospitalization NO Has patient had a PCN reaction occurring within the last 10 years: NO If all of the above answers are "NO", then may proceed with Cephalosporin use.        Medication List     STOP taking these medications    amLODipine 10 MG tablet Commonly known as: NORVASC   meloxicam 15 MG tablet Commonly known as: MOBIC   predniSONE 5 MG tablet Commonly known as: DELTASONE   temozolomide 100 MG capsule Commonly known as: TEMODAR       TAKE these medications    albuterol 108 (90 Base) MCG/ACT inhaler Commonly known as: VENTOLIN HFA Inhale 2 puffs into the lungs every 4 (four) hours as needed for wheezing or shortness of breath.   ciprofloxacin 500 MG tablet Commonly known as: CIPRO Take 1 tablet (500 mg total) by mouth 2 (two) times daily. urology   docusate sodium 100 MG capsule Commonly known as: COLACE Take 1 capsule (100 mg total) by mouth every 12 (twelve) hours. What changed: when to take this   folic acid 400 MCG tablet Commonly known as: FOLVITE Take 400 mcg by mouth daily.   gabapentin 100 MG capsule Commonly known as: NEURONTIN Take 300 mg by mouth 2 (two) times daily.   gabapentin 600 MG tablet Commonly known as: NEURONTIN Take 600 mg by mouth at bedtime.   metroNIDAZOLE 500 MG tablet Commonly known as: Flagyl Take 1 tablet (500 mg total) by mouth 2 (two) times daily for 7 days.   multivitamin capsule Take 1 capsule by mouth daily.   oxyCODONE-acetaminophen 5-325 MG tablet Commonly known as:  PERCOCET/ROXICET Take 1-2 tablets by mouth every 8 (eight) hours as needed for severe pain. What changed: how much to take   polyethylene glycol 17 g packet Commonly known as: MIRALAX / GLYCOLAX Take 17 g by mouth 2 (two) times daily.   senna-docusate 8.6-50 MG tablet Commonly known as: Senokot-S Take 1 tablet by mouth at bedtime as needed for mild constipation.   tamsulosin 0.4 MG Caps capsule Commonly known as: FLOMAX Take 0.4 mg by mouth at bedtime. Urology started 8/27        Follow-up Information  Andria Meuse, MD. Schedule an appointment as soon as possible for a visit.   Specialties: General Surgery, Colon and Rectal Surgery Why: office should call you as I called them this AM for an appointment Contact information: [address] [place] [address] [phone]                  Time coordinating discharge: 45 min  Signed:  Joseph Art DO  [facility] 03/09/2023, 1:51 PM

## 2023-03-09 NOTE — Discharge Instructions (Signed)
Holding chemo for 2-4 weeks-- would like you to be able to see Dr. Cliffton Asters prior to restarting

## 2023-03-09 NOTE — Progress Notes (Signed)
Progress Note     Subjective: Pt reports continued rectal pain and loose BMs. He remains not interested in diversion at this time.   Objective: Vital signs in last 24 hours: Temp:  [98.5 F (36.9 C)-98.7 F (37.1 C)] 98.7 F (37.1 C) (09/23 0627) Pulse Rate:  [74-84] 77 (09/23 0627) Resp:  [16-18] 18 (09/23 0627) BP: (114-124)/(65-79) 123/77 (09/23 0627) SpO2:  [97 %-98 %] 98 % (09/23 0627) Last BM Date : 03/08/23  Intake/Output from previous day: 09/22 0701 - 09/23 0700 In: 240 [P.O.:240] Out: -  Intake/Output this shift: No intake/output data recorded.  PE: General: pleasant, WD, thin male who is laying in bed in NAD Lungs: Respiratory effort nonlabored Abd: non-distended GU: rectal exam deferred at this time  Psych: A&Ox3 with an appropriate affect.    Lab Results:  Recent Labs    03/08/23 0551 03/09/23 0526  WBC 7.2 7.6  HGB 11.0* 11.1*  HCT 34.1* 34.7*  PLT 206 204   BMET Recent Labs    03/08/23 0551 03/09/23 0526  NA 137 133*  K 3.7 3.8  CL 101 99  CO2 27 26  GLUCOSE 97 100*  BUN 16 16  CREATININE 0.87 0.95  CALCIUM 9.0 8.8*   PT/INR No results for input(s): "LABPROT", "INR" in the last 72 hours. CMP     Component Value Date/Time   NA 133 (L) 03/09/2023 0526   K 3.8 03/09/2023 0526   CL 99 03/09/2023 0526   CO2 26 03/09/2023 0526   GLUCOSE 100 (H) 03/09/2023 0526   BUN 16 03/09/2023 0526   CREATININE 0.95 03/09/2023 0526   CREATININE 1.03 02/26/2023 0822   CALCIUM 8.8 (L) 03/09/2023 0526   PROT 7.4 03/05/2023 1013   ALBUMIN 3.7 03/05/2023 1013   AST 14 (L) 03/05/2023 1013   AST 15 02/26/2023 0822   ALT 11 03/05/2023 1013   ALT 12 02/26/2023 0822   ALKPHOS 70 03/05/2023 1013   BILITOT 0.7 03/05/2023 1013   BILITOT 0.7 02/26/2023 0822   GFRNONAA >60 03/09/2023 0526   GFRNONAA >60 02/26/2023 0822   GFRAA >60 02/01/2016 0011   Lipase  No results found for: "LIPASE"     Studies/Results: No results  found.  Anti-infectives: Anti-infectives (From admission, onward)    Start     Dose/Rate Route Frequency Ordered Stop   03/06/23 0100  ceFEPIme (MAXIPIME) 2 g in sodium chloride 0.9 % 100 mL IVPB        2 g 200 mL/hr over 30 Minutes Intravenous Every 8 hours 03/05/23 1644     03/05/23 1645  ceFEPIme (MAXIPIME) 2 g in sodium chloride 0.9 % 100 mL IVPB  Status:  Discontinued        2 g 200 mL/hr over 30 Minutes Intravenous Every 8 hours 03/05/23 1634 03/05/23 1643   03/05/23 1645  metroNIDAZOLE (FLAGYL) IVPB 500 mg        500 mg 100 mL/hr over 60 Minutes Intravenous Every 12 hours 03/05/23 1634     03/05/23 1600  ceFEPIme (MAXIPIME) 2 g in sodium chloride 0.9 % 100 mL IVPB        2 g 200 mL/hr over 30 Minutes Intravenous  Once 03/05/23 1551 03/05/23 1655   03/05/23 1600  metroNIDAZOLE (FLAGYL) IVPB 500 mg  Status:  Discontinued        500 mg 100 mL/hr over 60 Minutes Intravenous  Once 03/05/23 1551 03/05/23 1643        Assessment/Plan  Hx of  metastatic lung and prostate cancer S/P radiation of prostate Rectal pain with fistula  - fistula unlikely to heal in setting of radiation and recent chemotherapy - continue abx - will discuss duration with MD - sitz and prevent constipation  - patient not currently interested in diversion and can't guarantee that this would alleviate pain regardless  - no other recommendations from a surgical standpoint at this time   FEN: soft diet  VTE: LMWH ID: [id number] 9/19>>   LOS: 4 days   I reviewed hospitalist notes, last 24 h vitals and pain scores, last 48 h intake and output, last 24 h labs and trends, and last 24 h imaging results.    Juliet Rude, [facility] 03/09/2023, 10:32 AM Please see Amion for pager number during day hours 7:00am-4:30pm

## 2023-03-10 ENCOUNTER — Other Ambulatory Visit: Payer: Self-pay

## 2023-03-10 DIAGNOSIS — C61 Malignant neoplasm of prostate: Secondary | ICD-10-CM

## 2023-03-11 ENCOUNTER — Inpatient Hospital Stay: Payer: BC Managed Care – PPO

## 2023-03-11 ENCOUNTER — Inpatient Hospital Stay: Payer: BC Managed Care – PPO | Admitting: Physician Assistant

## 2023-03-13 ENCOUNTER — Other Ambulatory Visit: Payer: Self-pay

## 2023-03-16 NOTE — Progress Notes (Signed)
GU Location of Tumor / Histology: Prostate Ca  Mathew [name] completed radiation to the prostate this past July. Presents today after the past 8 weeks, he's been experiencing severe rectal pain, without any fevers, has only had some mild intermittent bleeding but nothing significant.  He's been followed closely by his oncologist at [facility].  03/05/2023 Mathew Malling, PA CT Pelvis with Contrast CLINICAL DATA: fistula, anorectal abscess   IMPRESSION: 1. Small amount of free fluid in the dependent pelvis, which is abnormal in the patient of this age group. However, overall, there is interval decrease in the amount of fluid when compared to the recent MRI from 02/28/2023. 2. Small amount of air along the left posteroinferior aspect of the prostate gland, which appears similar to the recent MRI pelvis. Aortic Atherosclerosis (ICD10-I70.0).  02/28/2023 Mathew Crews, NP MR Pelvis with/without Contrast CLINICAL DATA:  Evaluate anorectal abscess. Rectal pain for 6 weeks.  Radiation seeds for prostate cancer in May. History of known metastatic sarcoma. History of prostate cancer.  IMPRESSION: 1. Focused evaluation of the low pelvis performed. 2. Inflamed segment of anorectal junction (presumably radiation induced) with breakdown of the anterior left rectal wall resulting in fistulous communication to a deep left pelvic gas collection as detailed above. Focal fluid outpouching about the right rectal wall at this level could represent a separate focus of mucosal breakdown. 3. Suspect increased complex cul-de-sac fluid versus edematous fat compared to the CT of 01/21/2023.   Past/Anticipated interventions by urology, if any: NA  Past/Anticipated interventions by medical oncology, if any: NA  Weight changes, if any: {:18581}  Bowel/Bladder complaints, if any: {:18581}   Nausea/Vomiting, if any: {:18581}  Pain issues, if any:  {:18581}  SAFETY ISSUES: Prior radiation? {:18581} Pacemaker/ICD?  {:18581} Possible current pregnancy? Male Is the patient on methotrexate? No  Current Complaints / other details:

## 2023-03-18 NOTE — Progress Notes (Signed)
Radiation Oncology         [phone] ________________________________  Name: MARSHALL ELLIFF MRN: [id number]  Date: 03/19/2023  DOB: May 14, 1961  Post Treatment Note  CC: [name], MD  Andria Meuse, MD  Diagnosis:   62 y.o. gentleman with Stage T1c adenocarcinoma of the prostate with Gleason score of 4+3, and PSA of 10.2.   Interval Since Last Radiation:  3 months  First Treatment Date: 2022-11-06 - Last Treatment Date: 2022-12-17   Plan Name: Prostate Site: Prostate Technique: IMRT Mode: Photon Dose Per Fraction: 2.5 Gy Prescribed Dose (Delivered / Prescribed): 70 Gy / 70 Gy Prescribed Fxs (Delivered / Prescribed): 28 / 28   Narrative:  He completed a 5.5 week course of daily external beam radiation with on minimal urinary symptoms and modest fatigue throughout treatment. However, approximately 3-4 weeks after completing treatment, he developed persistent rectal discomfort that progressively worsened despite several courses of oral antibiotics, steroids and suppositories. We reviewed his daily cone beam imaging performed while he was getting radiation treatments daily and there was no evidence of rectal injury/performation. We also reviewed his recent CT C/A/P imaging report from [facility]  performed on 01/21/23 and this did not show any evidence of rectal injury. There were some air bubbles noted in the SpaceOAR gel itself which is common and non-worrisome.  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 Due to persistent, progressive rectal pain, and MRI pelvis was performed on 02/28/23 showing a fistulous communication between the distal rectum spaceOAR consistent with rectal perforation related to the hydrogel spacer.  I have been discussing this patient's care Dr. Cliffton Asters of general surgery about management of SpaceOAR related rectal injury using a strategy which is similar to the  conservative manage of diverticulitis with low residue diet and broad-spectrum antibiotics.  However, this patient's healing could potentially be delayed due to his regimen of Temodar and especially Avastin under the direction of the oncology team at [facility] and Dr. Arbutus Ped.  Accordingly, I sent an outside message to his oncologist at [facility] recommending a pause and chemotherapy for 2 to 4 weeks to promote rectal healing.  I spoke with the patient via telephone on 03/04/22 to review recent events and outline our current strategy.  He had completed a course of Cipro and Flagyl without relief so, under Dr. Lucilla Lame recommendation, [facility] antibiotics. He was discharged home on oral Cipro/Flagyl 03/09/23 which he completed on 03/16/23.  The patient reports that his pain level in the rectum and perineum has not improved in recent weeks and he is relying on Oxy/APAP 10/325 around the clock with gabapentin.  He is due for CT Chest/abd/pelvis tomorrow to restage his sarcoma, after 4 weeks off of Avastin/Temodar.  On review of systems, the patient states his pain is severe and there is not a component of the pain radiating down his legs.  ALLERGIES:  is allergic to aspirin and penicillins.  Meds: Current Outpatient Medications  Medication Sig Dispense Refill   albuterol (VENTOLIN HFA) 108 (90 Base) MCG/ACT inhaler Inhale 2 puffs into the lungs every 4 (four) hours as needed for wheezing or shortness of breath.     ciprofloxacin (CIPRO) 500 MG tablet Take 1 tablet (500 mg total) by mouth 2 (two) times daily. 14 tablet 0   docusate sodium (COLACE) 100 MG capsule Take 1 capsule (100 mg total) by mouth every  12 (twelve) hours. (Patient taking differently: Take 100 mg by mouth daily.) 60 capsule 0   folic acid (FOLVITE) 400 MCG tablet Take 400 mcg by mouth daily.     gabapentin (NEURONTIN) 100 MG capsule Take 300 mg by mouth 2 (two) times daily.     gabapentin  (NEURONTIN) 600 MG tablet Take 600 mg by mouth at bedtime.     Multiple Vitamin (MULTIVITAMIN) capsule Take 1 capsule by mouth daily.     oxyCODONE-acetaminophen (PERCOCET/ROXICET) 5-325 MG tablet Take 1-2 tablets by mouth every 8 (eight) hours as needed for severe pain. 30 tablet 0   polyethylene glycol (MIRALAX / GLYCOLAX) 17 g packet Take 17 g by mouth 2 (two) times daily.     senna-docusate (SENOKOT-S) 8.6-50 MG tablet Take 1 tablet by mouth at bedtime as needed for mild constipation.     tamsulosin (FLOMAX) 0.4 MG CAPS capsule Take 0.4 mg by mouth at bedtime. Urology started 8/27     No current facility-administered medications for this visit.   Facility-Administered Medications Ordered in Other Visits  Medication Dose Route Frequency Provider Last Rate Last Admin   sodium phosphate (FLEET) 7-19 GM/118ML enema 1 enema  1 enema Rectal Once Noel Christmas, MD       sodium phosphate (FLEET) 7-19 GM/118ML enema 1 enema  1 enema Rectal Once Noel Christmas, MD        Physical Findings:  vitals were not taken for this visit.   /10 Unable to assess due to telephone follow up format.  Lab Findings: Lab Results  Component Value Date   WBC 7.6 03/09/2023   HGB 11.1 (L) 03/09/2023   HCT 34.7 (L) 03/09/2023   MCV 98.0 03/09/2023   PLT 204 03/09/2023     Radiographic Findings: CT PELVIS W CONTRAST  Result Date: 03/05/2023 CLINICAL DATA:  fistula, anorectal  abscess EXAM: CT PELVIS WITH CONTRAST TECHNIQUE: Multidetector CT imaging of the pelvis was performed using the standard protocol following the bolus administration of intravenous contrast. RADIATION DOSE REDUCTION: This exam was performed according to the departmental dose-optimization program which includes automated exposure control, adjustment of the mA and/or kV according to patient size and/or use of iterative reconstruction technique. CONTRAST:  80mL OMNIPAQUE IOHEXOL 300 MG/ML  SOLN COMPARISON:  MRI from 02/28/2023.  FINDINGS: Urinary Tract: There is a 1.7 x 2.1 cm simple cyst in the right kidney lower pole. Unremarkable urinary bladder. Bowel: No disproportionate dilation of the visualized small or large bowel loops. No evidence of abnormal bowel wall thickening or inflammatory changes. The appendix is unremarkable. Vascular/Lymphatic: No ascites or pneumoperitoneum. No pelvic lymphadenopathy, by size criteria. No aneurysmal dilation of the major arteries. There are mild peripheral atherosclerotic vascular calcifications of the aorta and its major branches. Reproductive: Prostate gland with radiation fiducial markers noted. Other: There is small amount of free fluid in the dependent pelvis, which is abnormal in the patient of this age group. However, overall, there is interval decrease in the amount of fluid when compared to the recent MRI. There is small amount of air along the left posteroinferior aspect of the prostate gland, which appears similar to the recent MRI pelvis. However, distinct fistulous tract is not seen on this exam. Musculoskeletal: No suspicious osseous lesions. There are mild multilevel degenerative changes in the visualized spine. IMPRESSION: 1. Small amount of free fluid in the dependent pelvis, which is abnormal in the patient of this age group. However, overall, there is interval decrease in the amount of  fluid when compared to the recent MRI from 02/28/2023. 2. Small amount of air along the left posteroinferior aspect of the prostate gland, which appears similar to the recent MRI pelvis. Aortic Atherosclerosis (ICD10-I70.0). Electronically Signed   By: Jules Schick M.D.   On: 03/05/2023 15:23   MR PELVIS W WO CONTRAST  Result Date: 03/03/2023 CLINICAL DATA:  Evaluate anorectal abscess. Rectal pain for 6 weeks. Radiation seeds for prostate cancer in May. History of known metastatic sarcoma. History of prostate cancer. EXAM: MRI PELVIS WITHOUT AND WITH CONTRAST TECHNIQUE: Multiplanar multisequence  MR imaging of the pelvis was performed both before and after administration of intravenous contrast. CONTRAST:  7 cc Vueway COMPARISON:  Outside CT from [facility] dated 01/21/2023. prostate MRI of 01/10/2022. FINDINGS: Focused exam of the low pelvis was performed. Urinary Tract: Normal appearance of the urinary bladder, without intravesicular air. Bowel: The distal anus is normal. Involving the upper anus and rectal anal junction is edema ill definition of the anterior and left wall with a fistulous tract arising from approximately the 1 o'clock position, extending anteriorly and minimally superiorly to communicate with a periprosthetic left pelvic floor gas collection. The fistulous tract is most apparent on images 11/9 and 20/2. The periprosthetic primarily gas collection measures 2.3 x 1.3 cm on 08/09 and contiguous more caudally into the deep pelvic floor at 1.7 x 2.0 cm on 10/19. This gas collection is more well-defined than on 01/21/2023 CT. Involving the approximately 10 o'clock position of the anorectal junction is a focal fluid signal outpouching including on 13/9 and 13/6 of 1.0 cm. Normal appearance of the ischioanal fossa. Vascular/Lymphatic: Limited evaluation for pelvic adenopathy. No pelvic aneurysm identified. Reproductive: Prostate is poorly evaluated on this nondedicated exam. Morphology is likely altered by recent radiation. Other: There may be small volume cul-de-sac complex fluid versus edematous fat including on 06/05. Musculoskeletal: No gross osseous abnormality. IMPRESSION: 1. Focused evaluation of the low pelvis performed. 2. Inflamed segment of anorectal junction (presumably radiation induced) with breakdown of the anterior left rectal wall resulting in fistulous communication to a deep left pelvic gas collection as detailed above. Focal fluid outpouching about the right rectal wall at this level could represent a separate focus of mucosal breakdown. 3. Suspect increased complex  cul-de-sac fluid versus edematous fat compared to the CT of 01/21/2023. Electronically Signed   By: Jeronimo Greaves M.D.   On: 03/03/2023 14:12    Impression/Plan: 1. 62 y.o. gentleman with Stage T1c adenocarcinoma of the prostate with Gleason score of 4+3, and PSA of 10.2. He will continue to follow up with urology for ongoing PSA determinations and has an appointment scheduled with Dr. Arita Miss at [facility].   He is suffering with a painful non-healing rectal perforation at the site of SpaceOAR which developed after completion of radiation.  His concurrent Avastin therapy is likely a primary contributing factor as this agent does prohibit wound healing.  We had hoped that a pause in Avastin along with Low Residue Diet and now 5 rounds of antibiotics that the wound would heal with conservative measures.  At this point, I think additional more aggressive measures should be considered.  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  Pelvic floor reconstruction may also be a less desirable option given wound healing issues.  At this time, we are increasing from percocet 10/325, to Oxy IR 15 mg q6 hours, plan to review CT tomorrow, and discuss options with the rest of the team.     Marguarite Arbour, PA-C    Margaretmary Dys, MD  [facility]  Radiation Oncology Direct Dial: [phone]  Fax: [phone] [email or web address]  Skype  [email or web address]    Page Me

## 2023-03-19 ENCOUNTER — Encounter: Payer: Self-pay | Admitting: Radiation Oncology

## 2023-03-19 ENCOUNTER — Ambulatory Visit
Admission: RE | Admit: 2023-03-19 | Discharge: 2023-03-19 | Disposition: A | Discharge status: Home / Self Care | Payer: BC Managed Care – PPO | Source: Ambulatory Visit | Attending: Radiation Oncology | Admitting: Radiation Oncology

## 2023-03-19 ENCOUNTER — Other Ambulatory Visit: Payer: Self-pay

## 2023-03-19 VITALS — BP 120/80 | HR 99 | Temp 98.4°F | Resp 18 | Ht 69.0 in | Wt 160.4 lb

## 2023-03-19 DIAGNOSIS — C61 Malignant neoplasm of prostate: Secondary | ICD-10-CM

## 2023-03-19 DIAGNOSIS — K605 Anorectal fistula, unspecified: Secondary | ICD-10-CM

## 2023-03-19 MED ORDER — OXYCODONE HCL 15 MG PO TABS: 15.0000 mg | ORAL_TABLET | Freq: Four times a day (QID) | ORAL | 0 refills | Status: DC | PRN

## 2023-03-20 ENCOUNTER — Ambulatory Visit (HOSPITAL_COMMUNITY)
Admission: RE | Admit: 2023-03-20 | Discharge: 2023-03-20 | Disposition: A | Discharge status: Home / Self Care | Payer: BC Managed Care – PPO | Source: Ambulatory Visit | Attending: Physician Assistant | Admitting: Physician Assistant

## 2023-03-20 DIAGNOSIS — C61 Malignant neoplasm of prostate: Secondary | ICD-10-CM | POA: Diagnosis present

## 2023-03-20 MED ORDER — IOHEXOL 300 MG/ML  SOLN
100.0000 mL | Freq: Once | INTRAMUSCULAR | Status: AC | PRN
  Administered 2023-03-20: 100 mL via INTRAVENOUS

## 2023-03-20 MED ORDER — HEPARIN SOD (PORK) LOCK FLUSH 100 UNIT/ML IV SOLN
INTRAVENOUS | Status: AC
  Filled 2023-03-20: qty 5

## 2023-03-20 MED ORDER — HEPARIN SOD (PORK) LOCK FLUSH 100 UNIT/ML IV SOLN
500.0000 [IU] | Freq: Once | INTRAVENOUS | Status: AC
  Administered 2023-03-20: 500 [IU] via INTRAVENOUS

## 2023-03-23 ENCOUNTER — Telehealth: Payer: Self-pay

## 2023-03-23 NOTE — Progress Notes (Deleted)
Palliative Medicine [facility]  [phone] [phone]   Name: Mathew [name] Date: 03/23/2023 MRN: [id number]  DOB: 15-Apr-1961  Patient Care Team: [name], MD as PCP - General (Internal Medicine) Cherlyn Cushing, RN as Oncology Nurse Navigator    REASON FOR CONSULTATION: Mathew [name] is a 62 y.o. male with oncologic medical history including lung cancer (11/2015) and prostate cancer (07/2022) which is being managed by urology. Palliative ask to see for symptom management and goals of care.    SOCIAL HISTORY:     reports that he has never smoked. He quit smokeless tobacco use about 46 years ago.  His smokeless tobacco use included chew. He reports that he does not currently use alcohol. He reports that he does not currently use drugs after having used the following drugs: Marijuana.  ADVANCE DIRECTIVES:  None on file  CODE STATUS: Full Code  PAST MEDICAL HISTORY: Past Medical History:  Diagnosis Date  . Anemia   . Clubbing of nails   . Deafness in left ear   . Encounter for antineoplastic chemotherapy   . History of kidney stones   . Hypertension   . Hypoglycemia due to neoplasm   . Malignant neoplasm prostate [facility]) 07/2022   urologist--- dr pace;   bx 01/ 2024, Gleason 4+3  . 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 Riedal/  local [place]-- dr Sofie Hartigan;  dx 06/ 2017  resection RLL mass ;  recurrent 11/ 2021  s/p resection pleura tumor's, metastatic ;   05/ 2023  s/p resection tumor's,  HG plemorphic sarcoma  . 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 OA (osteoarthritis)   . Rash    10-15-2022  per pt due to chemo  . Shortness of breath dyspnea    just initially  . Vitamin D deficiency   . Wears hearing aid in both ears     PAST SURGICAL HISTORY:  Past Surgical History:  Procedure Laterality Date  . GOLD SEED IMPLANT N/A 10/21/2022    Procedure: GOLD SEED IMPLANT;  Surgeon: Noel Christmas, MD;  Location: [facility];  Service: Urology;  Laterality: N/A;  . INTERCOSTAL NERVE BLOCK Right 04/27/2020   Procedure: INTERCOSTAL NERVE BLOCK;  Surgeon: Loreli Slot, MD;  Location: [facility] OR;  Service: Thoracic;  Laterality: Right;  . KNEE CARTILAGE SURGERY Left 1976  . RESECTION OF MEDIASTINAL MASS Right 01/23/2016   @[facility]  by dr Dorris Fetch;   resection chest wall mass w/ en bloc wedge resection of RLL  . RESECTION OF MEDIASTINAL MASS Right 11/13/2021   Procedure: RESECTION OF PLEURAL TUMORS;  Surgeon: Loreli Slot, MD;  Location: [facility] OR;  Service: Thoracic;  Laterality: Right;  . SPACE OAR INSTILLATION N/A 10/21/2022   Procedure: SPACE OAR INSTILLATION;  Surgeon: Noel Christmas, MD;  Location[facility];  Service: Urology;  Laterality: N/A;  . THORACOTOMY Right 04/27/2020   Procedure: THORACOTOMY;  Surgeon: Loreli Slot, MD;  Location: [facility] OR;  Service: Thoracic;  Laterality: Right;  . THORACOTOMY Right 11/13/2021   Procedure: REDO THORACOTOMY;  Surgeon: Loreli Slot, MD;  Location: [facility] OR;  Service: Thoracic;  Laterality: Right;  . TONSILLECTOMY     child  . VIDEO BRONCHOSCOPY WITH ENDOBRONCHIAL ULTRASOUND N/A 12/17/2015   Procedure: VIDEO BRONCHOSCOPY WITH ENDOBRONCHIAL ULTRASOUND;  Surgeon: Loreli Slot, MD;  Location: [facility] OR;  Service: Thoracic;  Laterality: N/A;    HEMATOLOGY/ONCOLOGY HISTORY:  Oncology History  Malignant neoplasm of prostate (HCC)  07/16/2022 Cancer Staging   Staging form: Prostate, AJCC 8th Edition - Clinical stage from 07/16/2022: Stage IIC (cT1c, cN0, cM0, PSA: 10.2, Grade Group: 3) - Signed by Marcello Fennel, PA-C on 10/22/2022 Histopathologic type: Adenocarcinoma, NOS Stage prefix: Initial diagnosis Prostate specific antigen (PSA) range: 10 to 19 Gleason primary pattern: 4 Gleason secondary pattern: 3 Gleason score: 7 Histologic  grading system: 5 grade system Number of biopsy cores examined: 16 Number of biopsy cores positive: 10 Location of positive needle core biopsies: One side   08/26/2022 Initial Diagnosis   Malignant neoplasm of prostate (HCC)     ALLERGIES:  is allergic to aspirin and penicillins.  MEDICATIONS:  Current Outpatient Medications  Medication Sig Dispense Refill  . docusate sodium (COLACE) 100 MG capsule Take 1 capsule (100 mg total) by mouth every 12 (twelve) hours. (Patient taking differently: Take 100 mg by mouth daily.) 60 capsule 0  . folic acid (FOLVITE) 400 MCG tablet Take 400 mcg by mouth daily.    Marland Kitchen gabapentin (NEURONTIN) 100 MG capsule Take 300 mg by mouth 2 (two) times daily.    Marland Kitchen gabapentin (NEURONTIN) 600 MG tablet Take 600 mg by mouth at bedtime.    . Multiple Vitamin (MULTIVITAMIN) capsule Take 1 capsule by mouth daily.    Marland Kitchen oxyCODONE (ROXICODONE) 15 MG immediate release tablet Take 1 tablet (15 mg total) by mouth every 6 (six) hours as needed for pain. 60 tablet 0  . polyethylene glycol (MIRALAX / GLYCOLAX) 17 g packet Take 17 g by mouth 2 (two) times daily.    . tamsulosin (FLOMAX) 0.4 MG CAPS capsule Take 0.4 mg by mouth at bedtime. Urology started 8/27     No current facility-administered medications for this visit.   Facility-Administered Medications Ordered in Other Visits  Medication Dose Route Frequency Provider Last Rate Last Admin  . sodium phosphate (FLEET) 7-19 GM/118ML enema 1 enema  1 enema Rectal Once Kasandra Knudsen D, MD      . sodium phosphate (FLEET) 7-19 GM/118ML enema 1 enema  1 enema Rectal Once Noel Christmas, MD        VITAL SIGNS: There were no vitals taken for this visit. There were no vitals filed for this visit.  Estimated body mass index is 23.69 kg/m as calculated from the following:   Height as of 03/19/23: 5\' 9"  (1.753 m).   Weight as of 03/19/23: 160 lb 6.4 oz (72.8 kg).  LABS: CBC:    Component Value Date/Time   WBC 7.6 03/09/2023  0526   HGB 11.1 (L) 03/09/2023 0526   HGB 12.9 (L) 02/26/2023 0820   HCT 34.7 (L) 03/09/2023 0526   PLT 204 03/09/2023 0526   PLT 279 02/26/2023 0820   MCV 98.0 03/09/2023 0526   NEUTROABS 8.8 (H) 03/05/2023 1013   LYMPHSABS 0.4 (L) 03/05/2023 1013   MONOABS 0.8 03/05/2023 1013   EOSABS 0.2 03/05/2023 1013   BASOSABS 0.0 03/05/2023 1013   Comprehensive Metabolic Panel:    Component Value Date/Time   NA 133 (L) 03/09/2023 0526   K 3.8 03/09/2023 0526   CL 99 03/09/2023 0526   CO2 26 03/09/2023 0526   BUN 16 03/09/2023 0526   CREATININE 0.95 03/09/2023 0526   CREATININE 1.03 02/26/2023 0822   GLUCOSE 100 (H) 03/09/2023 0526   CALCIUM 8.8 (L) 03/09/2023 0526   AST 14 (L) 03/05/2023 1013   AST 15  02/26/2023 0822   ALT 11 03/05/2023 1013   ALT 12 02/26/2023 0822   ALKPHOS 70 03/05/2023 1013   BILITOT 0.7 03/05/2023 1013   BILITOT 0.7 02/26/2023 0822   PROT 7.4 03/05/2023 1013   ALBUMIN 3.7 03/05/2023 1013    RADIOGRAPHIC STUDIES: CT CHEST ABDOMEN PELVIS W CONTRAST  Result Date: 03/23/2023 CLINICAL DATA:  Prostate cancer, and metastatic sarcoma involving the right lung pleura. Prior radiation therapy. Current chemotherapy. Anorectal fistula. Radiation therapy to the prostate region completed in July 2024. * Tracking Code: BO * EXAM: CT CHEST, ABDOMEN, AND PELVIS WITH CONTRAST TECHNIQUE: Multidetector CT imaging of the chest, abdomen and pelvis was performed following the standard protocol during bolus administration of intravenous contrast. RADIATION DOSE REDUCTION: This exam was performed according to the departmental dose-optimization program which includes automated exposure control, adjustment of the mA and/or kV according to patient size and/or use of iterative reconstruction technique. CONTRAST:  OMNIPAQUE IOHEXOL 300 MG/ML  SOLN COMPARISON:  Multiple exams, including pelvic CT 03/05/2023 and CT images from [facility] dated 01/21/2023 FINDINGS: CT  CHEST FINDINGS Cardiovascular: Right Port-A-Cath tip: Right atrium. Mild left anterior descending coronary atheromatous vascular disease. Mediastinum/Nodes: Some of the extensive right pleural masses may be slightly invading the mediastinum. For example, an anterior pleural or mediastinal mass measures 4.4 by 2.4 cm on image 31 series 2, previously same. Similarly tumor along the azygoesophageal recess could be invading the mediastinum adjacent to the esophagus. Lungs/Pleura: There is evidence of prior wedge resections including the right lower lobe and right middle lobe. Extensive right pleural metastatic disease as on prior exams, increased from previous, with substantial and increased invasion of the chest wall. For example, the 9.0 by 7.7 cm right posterior pleural mass on image 32 series 2 shows increased destruction of the right ninth rib, previously the rib was only partially eroded and currently there is a segment of complete destruction of the rib and a greater degree of invasion into the soft tissues of the chest wall. This tumor previously measured 8.3 by 5.3 cm. Similarly, tumor extending in the seventh and ninth intercostal space on image 53 series 2 is substantially increased. Stable 3 by 4 mm left lower lobe nodule on image 73 series 4. Dependent 1.1 by 0.4 cm filling defect in the posterior trachea on image 25 series 4, probably mucous. Musculoskeletal: As noted above there is substantially increased destruction of the right ninth rib compared to previous. Other rib deformities related to prior trauma. CT ABDOMEN PELVIS FINDINGS Hepatobiliary: Tumor along the costophrenic angle causes substantial concavity of the adjacent liver margin as before. I do not see obvious invasion of the hepatic parenchyma along the diaphragm. A lesion posteriorly in the lateral segment left hepatic lobe measures 4.7 by 3.9 cm and has increased extrahepatic component, previously 4.0 by 2.7 cm. Contracted gallbladder.  Pancreas: Unremarkable Spleen: Unremarkable Adrenals/Urinary Tract: Benign right renal cysts. No further imaging workup of these lesions is indicated. Two punctate nonobstructive left kidney lower pole calculi. Mild urinary bladder wall thickening, cystitis is not excluded. Adrenal glands unremarkable. Stomach/Bowel: Prominent stool throughout the colon favors constipation. There continues to be a gas collection tracking along the left and inferior margin of the prostate gland, associated with an anterior lower rectal fistula as on image 15 of series 2. This is not substantially changed from 02/28/2023. Vascular/Lymphatic: Mild aortoiliac atheromatous vascular disease. No pathologic adenopathy identified. Reproductive: Fiducials along the prostate gland. Peri prostatic abscess due to the fistula  from the rectum, similar to prior exams. Other: Low-level subcutaneous and mesenteric edema similar to prior. Edema along tissue planes in the lower pelvis possibly from prior radiation therapy. Paucity of intra-abdominal adipose tissue. Musculoskeletal: Bridging spurring of both sacroiliac joints. Loss of disc height at L4-5 and L5-S1. 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 IMPRESSION: 1. Substantial progression of extensive right pleural metastatic disease, with increased destruction of the right ninth rib and increased invasion into the chest wall. 2. A lesion posteriorly in the lateral segment left hepatic lobe has increased extrahepatic component, currently measuring 4.7 by 3.9 cm, previously 4.0 by 2.7 cm. 3. Similar appearance of the anterior fistula from the lower rectum extending to a gas collection in the left and inferior periprostatic space. 4. Mild urinary bladder wall thickening, cystitis is not excluded. 5. Prominent stool throughout the colon favors constipation. 6. 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 7. Mild left anterior descending coronary atheromatous vascular disease. 8. Aortic atherosclerosis. Aortic Atherosclerosis (ICD10-I70.0). Electronically Signed   By: Gaylyn Rong M.D.   On: 03/23/2023 14:40   CT PELVIS W CONTRAST  Result Date: 03/05/2023 CLINICAL DATA:  fistula, anorectal  abscess EXAM: CT PELVIS WITH CONTRAST TECHNIQUE: Multidetector CT imaging of the pelvis was performed using the standard protocol following the bolus administration of intravenous contrast. RADIATION DOSE REDUCTION: This exam was performed according to the departmental dose-optimization program which includes automated exposure control, adjustment of the mA and/or kV according to patient size and/or use of iterative reconstruction technique. CONTRAST:  80mL OMNIPAQUE IOHEXOL 300 MG/ML  SOLN COMPARISON:  MRI from 02/28/2023. FINDINGS: Urinary Tract: There is a 1.7 x 2.1 cm simple cyst in the right kidney lower pole. Unremarkable urinary bladder. Bowel: No disproportionate dilation of the visualized small or large bowel loops. No evidence of abnormal bowel wall thickening or inflammatory changes. The appendix is unremarkable. Vascular/Lymphatic: No ascites or pneumoperitoneum. No pelvic lymphadenopathy, by size criteria. No aneurysmal dilation of the major arteries. There are mild peripheral atherosclerotic vascular calcifications of the aorta and its major branches. Reproductive: Prostate gland with radiation fiducial markers noted. Other: There is small amount of free fluid in the dependent pelvis, which is abnormal in the patient of this age group. However, overall, there is interval decrease in the amount of fluid when compared to the recent MRI. There is small amount of air along the left posteroinferior aspect of the prostate gland, which appears similar to the recent MRI pelvis. However, distinct fistulous tract is not seen on this  exam. Musculoskeletal: No suspicious osseous lesions. There are mild multilevel degenerative changes in the visualized spine. IMPRESSION: 1. Small amount of free fluid in the dependent pelvis, which is abnormal in the patient of this age group. However, overall, there is interval decrease in the amount of fluid when compared to the recent MRI from 02/28/2023. 2. Small amount of air along the left posteroinferior aspect of the prostate gland, which appears similar to the recent MRI pelvis. Aortic Atherosclerosis (ICD10-I70.0). Electronically Signed   By: Jules Schick M.D.   On: 03/05/2023 15:23   MR PELVIS W WO CONTRAST  Result Date: 03/03/2023 CLINICAL DATA:  Evaluate anorectal abscess. Rectal pain for 6 weeks. Radiation seeds for prostate cancer in May. History of known metastatic sarcoma. History of prostate cancer. EXAM: MRI PELVIS WITHOUT AND WITH  CONTRAST TECHNIQUE: Multiplanar multisequence MR imaging of the pelvis was performed both before and after administration of intravenous contrast. CONTRAST:  7 cc Vueway COMPARISON:  Outside CT [facility] dated 01/21/2023. prostate MRI of 01/10/2022. FINDINGS: Focused exam of the low pelvis was performed. Urinary Tract: Normal appearance of the urinary bladder, without intravesicular air. Bowel: The distal anus is normal. Involving the upper anus and rectal anal junction is edema ill definition of the anterior and left wall with a fistulous tract arising from approximately the 1 o'clock position, extending anteriorly and minimally superiorly to communicate with a periprosthetic left pelvic floor gas collection. The fistulous tract is most apparent on images 11/9 and 20/2. The periprosthetic primarily gas collection measures 2.3 x 1.3 cm on 08/09 and contiguous more caudally into the deep pelvic floor at 1.7 x 2.0 cm on 10/19. This gas collection is more well-defined than on 01/21/2023 CT. Involving the approximately 10 o'clock position of the anorectal  junction is a focal fluid signal outpouching including on 13/9 and 13/6 of 1.0 cm. Normal appearance of the ischioanal fossa. Vascular/Lymphatic: Limited evaluation for pelvic adenopathy. No pelvic aneurysm identified. Reproductive: Prostate is poorly evaluated on this nondedicated exam. Morphology is likely altered by recent radiation. Other: There may be small volume cul-de-sac complex fluid versus edematous fat including on 06/05. Musculoskeletal: No gross osseous abnormality. IMPRESSION: 1. Focused evaluation of the low pelvis performed. 2. Inflamed segment of anorectal junction (presumably radiation induced) with breakdown of the anterior left rectal wall resulting in fistulous communication to a deep left pelvic gas collection as detailed above. Focal fluid outpouching about the right rectal wall at this level could represent a separate focus of mucosal breakdown. 3. Suspect increased complex cul-de-sac fluid versus edematous fat compared to the CT of 01/21/2023. Electronically Signed   By: Jeronimo Greaves M.D.   On: 03/03/2023 14:12    PERFORMANCE STATUS (ECOG) : {CHL ONC ECOG [id number]}  Review of Systems Unless otherwise noted, a complete review of systems is negative.  Physical Exam General: NAD Cardiovascular: regular rate and rhythm Pulmonary: clear ant fields Abdomen: soft, nontender, + bowel sounds Extremities: no edema, no joint deformities Skin: no rashes Neurological:  IMPRESSION: *** I introduced myself, Jalin Alicea RN, and Palliative's role in collaboration with the oncology team. Concept of Palliative Care was introduced as specialized medical care for people and their families living with serious illness.  It focuses on providing relief from the symptoms and stress of a serious illness.  The goal is to improve quality of life for both the patient and the family. Values and goals of care important to patient and family were attempted to be elicited.    We discussed *** current  illness and what it means in the larger context of *** on-going co-morbidities. Natural disease trajectory and expectations were discussed.  I discussed the importance of continued conversation with family and their medical providers regarding overall plan of care and treatment options, ensuring decisions are within the context of the patients values and GOCs.  PLAN: Established therapeutic relationship. Education provided on palliative's role in collaboration with their Oncology/Radiation team. I will plan to see patient back in 2-4 weeks in collaboration to other oncology appointments.    Patient expressed understanding and was in agreement with this plan. He also understands that He can call the clinic at any time with any questions, concerns, or complaints.   Thank you for your referral and allowing Palliative to assist in [name]'s  care.   Number and complexity of problems addressed: ***HIGH - 1 or more chronic illnesses with SEVERE exacerbation, progression, or side effects of treatment - advanced cancer, pain. Any controlled substances utilized were prescribed in the context of palliative care.   Visit consisted of counseling and education dealing with the complex and emotionally intense issues of symptom management and palliative care in the setting of serious and potentially life-threatening illness.Greater than 50%  of this time was spent counseling and coordinating care related to the above assessment and plan.  Signed by: Willette Alma, AGPCNP-BC Palliative Medicine Team/[facility]   *Please note that this is a verbal dictation therefore any spelling or grammatical errors are due to the "Dragon Medical One" system interpretation.

## 2023-03-23 NOTE — Telephone Encounter (Signed)
Our office received call from Lora Havens, RN) Dr. Sueanne Margarita nurse at [facility] to update [place] on recommend pain medication to patient recent visit on 03/20/2023.  Dr. Emeline Darling recommends long acting narcotic OxyContin 20 mg 1 tablet by mouth twice a day, changing Gabapentin to Lyrica 100 mg twice a day for neuropathic pain, and to continue Oxycodone 10-325 mg four times a day as needed.  If have any questions to call Dr. Molli Posey office at [phone].  Information was given to Dr. Kathrynn Running, [facility], & [place], PA-C.

## 2023-03-24 ENCOUNTER — Telehealth: Payer: Self-pay

## 2023-03-24 NOTE — Progress Notes (Unsigned)
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  PRIOR THERAPY: 1) status post resection and the patient has recurrence and October 2021 status post resection again under the care of Dr. Dorris Fetch. 2) Sunitinib 3 7.5 mg p.o. daily.  First dose started April 08, 2021.  Status post 3 months of treatment.  This treatment was discontinued secondary to disease progression. 3) Votrient (pazopanib) 800 mg p.o. daily.  Started August 10, 2021.  Status post 2 months of treatment discontinued secondary to disease progression. 4) status post redo right thoracotomy with resection of pleural tumors and lymph node dissection under the care of Dr. Dorris Fetch on Nov 13, 2021. 5) Systemic chemotherapy with doxorubicin 25 Mg/M2, ifosfamide 2500 Mg/M2 and mesna 2500 Mg/M2 days 1-3 every 3 weeks.  First dose February 12, 2022 at [facility].  Status post 2 cycles of treatment discontinued secondary to disease progression. 6) Radiation to the prostate cancer, under the care of Dr. Kathrynn Running. Last day of radiation on 12/17/22 7) Systemic chemotherapy with Temodar 200 Mg/M2 on days 1-7 and day 15-21 with a Avastin 5 Mg/KG on days 8 and 22 every 4 weeks.  Status post 11 cycles . Discontinued due to disease progression   CURRENT THERAPY: Re-evaluation by Dr. Prudencio Burly ([facility]) and Dr. Salomon Fick [facility]).   INTERVAL HISTORY: Mathew [name] 62 y.o. male returns to the clinic today for a follow-up visit accompanied by his wife.  The patient was last seen in the clinic on 02/26/2023.  The patient is being followed for his fibrous tumor.  He is currently on Avastin and Temodar.    He is also followed by urology, Dr.  Arita Miss, and radiation oncology for prostate cancer.  The patient had been endorsing pelvic pain.  He had imaging that showed fistulous communication between the distal rectum consistent with rectal perforation.  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 He completed antibiotics with flagyl and cefepime. General surgery was consulted. He is supposed to see Dr. Cliffton Asters as an outpatient on 04/03/23.   For pain, he is being treated with oxycodone in addition to lyrica. He is being weaned off gabapentin. He is being treated by pain management by Dr. Emeline Darling in [place].    The patient denies any fever, chills, night sweats, or unexplained weight loss. His energy is "good". He is not able to exercise as much due to the rectal pain.  He occasionally will have mild rectal bleeding with bowel movements.  However, he states is only a small amount of blood.  He denies any other abnormal bleeding or bruising such as epistaxis, gingival bleeding, hemoptysis, or hematemesis.  He states his breathing is okay".  He states is not getting any worse to his knowledge.  Denies any significant cough or chest pain. Denies any headaches.  Denies any nausea, vomiting, or diarrhea.he has been experiencing some mild constipation due to the pain medication.  He is taking Colace and MiraLAX approximately twice a week if needed.  The patient has previously been seen by Dr. Waymon Amato at [facility] and Dr. Salomon Fick at [facility].  They have overnighted the copy of his CT scan to their office.  He recently had a restaging CT scan.  He is here today for evaluation and to review his scan results.    MEDICAL HISTORY: Past Medical History:  Diagnosis Date   Anemia    Clubbing of nails    Deafness in left ear    Encounter for antineoplastic chemotherapy    History of kidney stones    Hypertension    Hypoglycemia due to neoplasm    Malignant neoplasm prostate [facility])  07/2022   urologist--- dr pace;   bx 01/ 2024, Gleason 4+3   Malignant solitary fibrous neoplasm (HCC) 11/2015   oncologist--- @ [facility] Dr R. 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  MEDICATIONS:  Current Outpatient Medications  Medication Sig Dispense Refill   docusate sodium (COLACE) 100 MG capsule Take 1 capsule (100 mg total) by mouth every 12 (twelve) hours. (Patient taking differently: Take 100 mg by mouth daily.) 60 capsule 0   folic acid (FOLVITE) 400 MCG tablet Take 400 mcg by mouth daily.     gabapentin (NEURONTIN) 600 MG tablet Take 600 mg by mouth 2 (two) times daily.     Multiple Vitamin (MULTIVITAMIN) capsule Take 1 capsule by mouth daily.     oxyCODONE (ROXICODONE) 15 MG immediate release tablet Take 1 tablet (15 mg total) by mouth every 6 (six) hours as needed for pain. 60 tablet 0   polyethylene glycol (MIRALAX / GLYCOLAX) 17 g packet Take 17 g by mouth 2 (two) times daily.     pregabalin (LYRICA) 100 MG capsule Take 100 mg by mouth 2 (two) times daily.     tamsulosin (FLOMAX) 0.4 MG CAPS capsule Take 0.4 mg by mouth at bedtime. Urology started 8/27     gabapentin (NEURONTIN) 100 MG capsule Take 300 mg by mouth 2 (two) times daily. (Patient not taking: Reported on 03/26/2023)     No current facility-administered medications for this visit.   Facility-Administered Medications Ordered in Other Visits  Medication Dose Route Frequency Provider Last Rate Last Admin   sodium phosphate (FLEET) 7-19 GM/118ML enema 1 enema  1  enema Rectal Once Noel Christmas, MD       sodium phosphate (FLEET) 7-19 GM/118ML enema 1 enema  1 enema Rectal Once Noel Christmas, MD        SURGICAL HISTORY:  Past Surgical History:  Procedure Laterality Date   GOLD SEED IMPLANT N/A 10/21/2022   Procedure: GOLD SEED IMPLANT;  Surgeon: Noel Christmas, MD;  Location: [facility];  Service: Urology;  Laterality: N/A;   INTERCOSTAL NERVE BLOCK Right 04/27/2020   Procedure: INTERCOSTAL NERVE BLOCK;  Surgeon: Loreli Slot, MD;  Location: [facility] OR;  Service: Thoracic;  Laterality: Right;   KNEE CARTILAGE SURGERY Left 1976   RESECTION OF MEDIASTINAL MASS Right 01/23/2016   @[facility]  by dr Dorris Fetch;   resection chest wall mass w/ en bloc wedge resection of RLL   RESECTION  OF MEDIASTINAL MASS Right 11/13/2021   Procedure: RESECTION OF PLEURAL TUMORS;  Surgeon: Loreli Slot, MD;  Location: [facility] OR;  Service: Thoracic;  Laterality: Right;   SPACE OAR INSTILLATION N/A 10/21/2022   Procedure: SPACE OAR INSTILLATION;  Surgeon: Noel Christmas, MD;  Location: [facility];  Service: Urology;  Laterality: N/A;   THORACOTOMY Right 04/27/2020   Procedure: THORACOTOMY;  Surgeon: Loreli Slot, MD;  Location: [facility] OR;  Service: Thoracic;  Laterality: Right;   THORACOTOMY Right 11/13/2021   Procedure: REDO THORACOTOMY;  Surgeon: Loreli Slot, MD;  Location: [facility] OR;  Service: Thoracic;  Laterality: Right;   TONSILLECTOMY     child   VIDEO BRONCHOSCOPY WITH ENDOBRONCHIAL ULTRASOUND N/A 12/17/2015   Procedure: VIDEO BRONCHOSCOPY WITH ENDOBRONCHIAL ULTRASOUND;  Surgeon: Loreli Slot, MD;  Location: [facility] OR;  Service: Thoracic;  Laterality: N/A;    REVIEW OF SYSTEMS:   Review of Systems  Constitutional: Positive for fatigue. Negative for appetite change, chills, fever and unexpected weight change.  HENT: Negative for mouth sores, nosebleeds, sore throat and trouble swallowing.   Eyes: Negative  for eye problems and icterus.  Respiratory: Negative for cough, hemoptysis, shortness of breath and wheezing.   Cardiovascular: Negative for chest pain and leg swelling.  Gastrointestinal: Positive for mild constipation. Positive for rectal pain and mild bleeding with bowel movements. Negative for abdominal pain,  diarrhea, nausea and vomiting.  Genitourinary: Negative for bladder incontinence, difficulty urinating, dysuria, frequency and hematuria.   Musculoskeletal: Negative for back pain, gait problem, neck pain and neck stiffness.  Skin: Negative for itching and rash.  Neurological: Negative for dizziness, extremity weakness, gait problem, headaches, light-headedness and seizures.  Hematological: Negative for adenopathy. Does not bruise/bleed easily.  Psychiatric/Behavioral: Negative for confusion, depression and sleep disturbance. The patient is not nervous/anxious.     PHYSICAL EXAMINATION:  Blood pressure 127/83, pulse 95, temperature 99.9 F (37.7 C), temperature source Oral, resp. rate 17, weight 161 lb 8 oz (73.3 kg), SpO2 100%.  ECOG PERFORMANCE STATUS: 1  Physical Exam  Constitutional: Oriented to person, place, and time and well-developed, well-nourished, and in no distress.  HENT:  Head: Normocephalic and atraumatic.  Mouth/Throat: Oropharynx is clear and moist. No oropharyngeal exudate.  Eyes: Conjunctivae are normal. Right eye exhibits no discharge. Left eye exhibits no discharge. No scleral icterus.  Neck: Normal range of motion. Neck supple.  Cardiovascular: Normal rate, regular rhythm, normal heart sounds and intact distal pulses.   Pulmonary/Chest: Effort normal.  Quiet breath sounds in the right lung.  No respiratory distress. No wheezes. No rales.  Abdominal: Soft. Bowel sounds are normal. Exhibits no distension and no mass. There is no tenderness. He had mild soreness over his umbilicus itself. He lifts weights and it may be musculoskeletal.  Musculoskeletal: Normal  range of motion. Lymphadenopathy:    No cervical adenopathy.  Neurological: Alert and oriented to person, place, and time. Exhibits normal muscle tone. Gait normal. Coordination normal.  Skin: Skin is warm and dry. No rash noted. Not diaphoretic. No erythema. No pallor.  Psychiatric: Mood, memory and judgment normal.  Vitals reviewed.  LABORATORY DATA: Lab Results  Component Value Date   WBC 8.1 03/26/2023   HGB 10.4 (L) 03/26/2023   HCT 32.7 (L) 03/26/2023   MCV 97.6 03/26/2023   PLT 329 03/26/2023      Chemistry      Component Value Date/Time   NA 133 (L) 03/09/2023 0526   K 3.8 03/09/2023 0526  CL 99 03/09/2023 0526   CO2 26 03/09/2023 0526   BUN 16 03/09/2023 0526   CREATININE 0.95 03/09/2023 0526   CREATININE 1.03 02/26/2023 0822      Component Value Date/Time   CALCIUM 8.8 (L) 03/09/2023 0526   ALKPHOS 70 03/05/2023 1013   AST 14 (L) 03/05/2023 1013   AST 15 02/26/2023 0822   ALT 11 03/05/2023 1013   ALT 12 02/26/2023 0822   BILITOT 0.7 03/05/2023 1013   BILITOT 0.7 02/26/2023 1610       RADIOGRAPHIC STUDIES:  CT CHEST ABDOMEN PELVIS W CONTRAST  Result Date: 03/23/2023 CLINICAL DATA:  Prostate cancer, and metastatic sarcoma involving the right lung pleura. Prior radiation therapy. Current chemotherapy. Anorectal fistula. Radiation therapy to the prostate region completed in July 2024. * Tracking Code: BO * EXAM: CT CHEST, ABDOMEN, AND PELVIS WITH CONTRAST TECHNIQUE: Multidetector CT imaging of the chest, abdomen and pelvis was performed following the standard protocol during bolus administration of intravenous contrast. RADIATION DOSE REDUCTION: This exam was performed according to the departmental dose-optimization program which includes automated exposure control, adjustment of the mA and/or kV according to patient size and/or use of iterative reconstruction technique. CONTRAST:  OMNIPAQUE IOHEXOL 300 MG/ML  SOLN COMPARISON:  Multiple exams, including  pelvic CT 03/05/2023 and CT [facility] from [facility] dated 01/21/2023 FINDINGS: CT CHEST FINDINGS Cardiovascular: Right Port-A-Cath tip: Right atrium. Mild left anterior descending coronary atheromatous vascular disease. Mediastinum/Nodes: Some of the extensive right pleural masses may be slightly invading the mediastinum. For example, an anterior pleural or mediastinal mass measures 4.4 by 2.4 cm on image 31 series 2, previously same. Similarly tumor along the azygoesophageal recess could be invading the mediastinum adjacent to the esophagus. Lungs/Pleura: There is evidence of prior wedge resections including the right lower lobe and right middle lobe. Extensive right pleural metastatic disease as on prior exams, increased from previous, with substantial and increased invasion of the chest wall. For example, the 9.0 by 7.7 cm right posterior pleural mass on image 32 series 2 shows increased destruction of the right ninth rib, previously the rib was only partially eroded and currently there is a segment of complete destruction of the rib and a greater degree of invasion into the soft tissues of the chest wall. This tumor previously measured 8.3 by 5.3 cm. Similarly, tumor extending in the seventh and ninth intercostal space on image 53 series 2 is substantially increased. Stable 3 by 4 mm left lower lobe nodule on image 73 series 4. Dependent 1.1 by 0.4 cm filling defect in the posterior trachea on image 25 series 4, probably mucous. Musculoskeletal: As noted above there is substantially increased destruction of the right ninth rib compared to previous. Other rib deformities related to prior trauma. CT ABDOMEN PELVIS FINDINGS Hepatobiliary: Tumor along the costophrenic angle causes substantial concavity of the adjacent liver margin as before. I do not see obvious invasion of the hepatic parenchyma along the diaphragm. A lesion posteriorly in the lateral segment left hepatic lobe measures 4.7 by 3.9  cm and has increased extrahepatic component, previously 4.0 by 2.7 cm. Contracted gallbladder. Pancreas: Unremarkable Spleen: Unremarkable Adrenals/Urinary Tract: Benign right renal cysts. No further imaging workup of these lesions is indicated. Two punctate nonobstructive left kidney lower pole calculi. Mild urinary bladder wall thickening, cystitis is not excluded. Adrenal glands unremarkable. Stomach/Bowel: Prominent stool throughout the colon favors constipation. There continues to be a gas collection tracking along the left and inferior margin of the  prostate gland, associated with an anterior lower rectal fistula as on image 15 of series 2. This is not substantially changed from 02/28/2023. Vascular/Lymphatic: Mild aortoiliac atheromatous vascular disease. No pathologic adenopathy identified. Reproductive: Fiducials along the prostate gland. Peri prostatic abscess due to the fistula from the rectum, similar to prior exams. Other: Low-level subcutaneous and mesenteric edema similar to prior. Edema along tissue planes in the lower pelvis possibly from prior radiation therapy. Paucity of intra-abdominal adipose tissue. Musculoskeletal: Bridging spurring of both sacroiliac joints. Loss of disc height at L4-5 and L5-S1. 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 IMPRESSION: 1. Substantial progression of extensive right pleural metastatic disease, with increased destruction of the right ninth rib and increased invasion into the chest wall. 2. A lesion posteriorly in the lateral segment left hepatic lobe has increased extrahepatic component, currently measuring 4.7 by 3.9 cm, previously 4.0 by 2.7 cm. 3. Similar appearance of the anterior fistula from the lower rectum extending to a gas collection in the left and inferior periprostatic space. 4. Mild urinary bladder wall thickening, cystitis is not excluded. 5. Prominent stool  throughout the colon favors constipation. 6. 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 7. Mild left anterior descending coronary atheromatous vascular disease. 8. Aortic atherosclerosis. Aortic Atherosclerosis (ICD10-I70.0). Electronically Signed   By: Gaylyn Rong M.D.   On: 03/23/2023 14:40   CT PELVIS W CONTRAST  Result Date: 03/05/2023 CLINICAL DATA:  fistula, anorectal  abscess EXAM: CT PELVIS WITH CONTRAST TECHNIQUE: Multidetector CT imaging of the pelvis was performed using the standard protocol following the bolus administration of intravenous contrast. RADIATION DOSE REDUCTION: This exam was performed according to the departmental dose-optimization program which includes automated exposure control, adjustment of the mA and/or kV according to patient size and/or use of iterative reconstruction technique. CONTRAST:  80mL OMNIPAQUE IOHEXOL 300 MG/ML  SOLN COMPARISON:  MRI from 02/28/2023. FINDINGS: Urinary Tract: There is a 1.7 x 2.1 cm simple cyst in the right kidney lower pole. Unremarkable urinary bladder. Bowel: No disproportionate dilation of the visualized small or large bowel loops. No evidence of abnormal bowel wall thickening or inflammatory changes. The appendix is unremarkable. Vascular/Lymphatic: No ascites or pneumoperitoneum. No pelvic lymphadenopathy, by size criteria. No aneurysmal dilation of the major arteries. There are mild peripheral atherosclerotic vascular calcifications of the aorta and its major branches. Reproductive: Prostate gland with radiation fiducial markers noted. Other: There is small amount of free fluid in the dependent pelvis, which is abnormal in the patient of this age group. However, overall, there is interval decrease in the amount of fluid when compared to the recent MRI. There is small amount of air along the left posteroinferior aspect of the prostate gland, which  appears similar to the recent MRI pelvis. However, distinct fistulous tract is not seen on this exam. Musculoskeletal: No suspicious osseous lesions. There are mild multilevel degenerative changes in the visualized spine. IMPRESSION: 1. Small amount of free fluid in the dependent pelvis, which is abnormal in the patient of this age group. However, overall, there is interval decrease in the amount of fluid when compared to the recent MRI from 02/28/2023. 2. Small amount of air along the left posteroinferior aspect of the prostate gland, which appears similar to the recent MRI pelvis. Aortic Atherosclerosis (ICD10-I70.0). Electronically Signed   By: Jules Schick M.D.   On:  03/05/2023 15:23   MR PELVIS W WO CONTRAST  Result Date: 03/03/2023 CLINICAL DATA:  Evaluate anorectal abscess. Rectal pain for 6 weeks. Radiation seeds for prostate cancer in May. History of known metastatic sarcoma. History of prostate cancer. EXAM: MRI PELVIS WITHOUT AND WITH CONTRAST TECHNIQUE: Multiplanar multisequence MR imaging of the pelvis was performed both before and after administration of intravenous contrast. CONTRAST:  7 cc Vueway COMPARISON:  Outside CT from [facility] dated 01/21/2023. prostate MRI of 01/10/2022. FINDINGS: Focused exam of the low pelvis was performed. Urinary Tract: Normal appearance of the urinary bladder, without intravesicular air. Bowel: The distal anus is normal. Involving the upper anus and rectal anal junction is edema ill definition of the anterior and left wall with a fistulous tract arising from approximately the 1 o'clock position, extending anteriorly and minimally superiorly to communicate with a periprosthetic left pelvic floor gas collection. The fistulous tract is most apparent on images 11/9 and 20/2. The periprosthetic primarily gas collection measures 2.3 x 1.3 cm on 08/09 and contiguous more caudally into the deep pelvic floor at 1.7 x 2.0 cm on 10/19. This gas collection is more  well-defined than on 01/21/2023 CT. Involving the approximately 10 o'clock position of the anorectal junction is a focal fluid signal outpouching including on 13/9 and 13/6 of 1.0 cm. Normal appearance of the ischioanal fossa. Vascular/Lymphatic: Limited evaluation for pelvic adenopathy. No pelvic aneurysm identified. Reproductive: Prostate is poorly evaluated on this nondedicated exam. Morphology is likely altered by recent radiation. Other: There may be small volume cul-de-sac complex fluid versus edematous fat including on 06/05. Musculoskeletal: No gross osseous abnormality. IMPRESSION: 1. Focused evaluation of the low pelvis performed. 2. Inflamed segment of anorectal junction (presumably radiation induced) with breakdown of the anterior left rectal wall resulting in fistulous communication to a deep left pelvic gas collection as detailed above. Focal fluid outpouching about the right rectal wall at this level could represent a separate focus of mucosal breakdown. 3. Suspect increased complex cul-de-sac fluid versus edematous fat compared to the CT of 01/21/2023. Electronically Signed   By: Jeronimo Greaves M.D.   On: 03/03/2023 14:12     ASSESSMENT/PLAN:  This is a very pleasant 62 year old male with  1) recurrent solitary fibrous tumor of the right lung.  This was initially diagnosed in June 2017.  2) prostate adenocarcinoma with Gleason score of 7 (4+3) diagnosed in February 2024 followed by urology.   He is status post resection.  He had recurrence in October 2021 and underwent re-resection under the care of Dr. Dorris Fetch.  He had been on observation as there is no role for adjuvant therapy for his condition.  He was seen by Dr. Lin Givens at [facility] who recommended observation and consideration of treatment with Sunitinib followed by surgery if the patient had disease recurrence.   The patient had a new 1.4 cm nodule posteriorly and the patient underwent treatment with sunitinib 37.5 mg  p.o. daily.  He was on this for 3 months. He had been tolerating this fairly well except for hypertension.  Unfortunately scan showed evidence of disease progression with interval development of multiple new pleural-based nodules in the right hemithorax measuring up to 2.9 cm which is concerning for recurrent/metastatic disease.    He was then seen by Dr. Dorris Fetch who offered surgical resection.  Dr. Lin Givens recommended neoadjuvant treatment with pazopanib for 8 weeks followed by surgical resection if he had improvement in his disease followed by continued adjuvant treatment for another  6 to 8 months.  Unfortunately, after 2 months of treatment the CT scan showed clear evidence of disease progression with enlarging right pleural-based masses.  He was referred to Dr. 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  Molecular studies by foundation 1 and PD-L1 did not show any actionable mutations and his PD-L1 expression was negative.    The patient then saw Dr. Waymon Amato at [facility] who recommended inpatient systemic chemotherapy with doxorubicin, ifosfamide, and mesna IV every 3 weeks.  He had a 2D echo as well as a Port-A-Cath placed.  This was discontinued due to evidence of disease progression.    Dr. Waymon Amato recommended for the patient treatment with Temodar 200 Mg/M2 on days 1-7 and 15-21 in addition to Avastin 5 Mg/M2 on days 8 and 22 every 4 weeks.  He is status post 11 cycles.   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 His avastin can cause delayed wound healing.   The patient was seen with Dr. Arbutus Ped today.  Dr. Arbutus Ped personally and independently reviewed the scan and discussed results with the patient today.  The scan showed disease progression with  extensive right pleural metastatic disease, with increased destruction of the right ninth rib and increased invasion into the chest wall. There is also increase in lesion in the hepatic lobe .  Dr. Arbutus Ped reviewed this with the patient.  Unfortunately, his current treatment is not working.  Additionally because of the fistula, he is not a good candidate for Avastin to continue.  Dr. Arbutus Ped recommends having the patient see expert at [place] [facility] to see if there are any additional options for him or clinical trials.  The patient's CT scan disc was overnighted to their office.  Additionally we will call radiology to see if we can push his CT scan images through the canopy system.  Dr. Arbutus Ped also did discuss with the patient Palliative care and hospice.  We be happy to arrange for any future treatment locally after he is evaluated by Dr. Salomon Fick and/or Dr. Waymon Amato.  We will leave his appointment open for now.  He will continue to follow with Dr. Emeline Darling for pain management.  He will continue to take Colace and laxatives if needed to keep his bowel movements soft which exacerbates his rectal pain if he has constipation.  He is scheduled to see Dr. Cliffton Asters from general surgery on 04/03/2023  The patient was advised to call immediately if he has any concerning symptoms in the interval. The patient voices understanding of current disease status and treatment options and is in agreement with the current care plan. All questions were answered. The patient knows to call the clinic with any problems, questions or concerns. We can certainly see the patient much sooner if necessary  No orders of the defined types were placed in this encounter.     Spence Soberano L Amora Sheehy, PA-C 03/26/23  ADDENDUM: Hematology/Oncology Attending:  I had a face-to-face encounter with the patient today.  I reviewed his record, lab, scan and recommended his care plan.  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  The patient status post several treatment regimens for the solitary fibrous tumor and most recently has been on treatment with Temodar and Avastin status post 11 cycles. He had repeat CT scan of the chest abdomen and pelvis performed recently.  I personally and independently reviewed the scan images and discussed the result and showed the images to the patient and his wife.  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  There was also a lesion posteriorly in the lateral segment of the left hepatic lobe that has increased extrahepatic component.  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 I had a lengthy discussion with the patient and his wife about his current condition. He is seen by 2 of the sarcoma expert Dr. Waymon Amato at [facility] and Dr. Salomon Fick at [facility] in [facility] and they are waiting for his scan results to give additional recommendation.  He may be a candidate for clinical trial at [facility] and he will wait to hear from Dr. Salomon Fick. I also discussed with the patient briefly the option of palliative care and hospice if he has no other effective treatment options. I will wait to hear from Dr. Waymon Amato or Dr. Salomon Fick about the next step in his treatment and we will be happy to do his treatment locally if needed. For the right pleural metastatic disease with destruction of the right ninth rib and chest wall  invasion, I offered the patient palliative radiotherapy to this area and he will think about it.  He is currently on pain medication by the palliative care team. The patient was advised to call immediately if he has any other concerning symptoms in the interval. The total time spent in the appointment was 30 minutes. Disclaimer: This note was dictated with voice recognition software. Similar sounding words can inadvertently be transcribed and may be missed upon review. Lajuana Matte, MD

## 2023-03-24 NOTE — Telephone Encounter (Signed)
RN to call Mr. Newcom to follow-up on recommended pain medication and if Dr. Emeline Darling is to continue to address pain management at this point to avoid duplication of medications per Marcello Fennel, PA-C.  RN spoke with Mrs. Lowell Guitar and she reports Mr. Evilsizer did receive medication orders mentioned in the previous note.  They are currently waiting on authorization from insurance for the OxyContin.  She said Mr. Barg is taking per Dr. Emeline Darling both Gabapentin and Lyric together to wean off the gabapentin for pain.  Dr. Emeline Darling will be addressing his pain management at this point per wife.  She also said he will have a follow-up appointment but at this time she doesn't know when, but Dr. Emeline Darling has been in contact with Mr. Corl.  RN to inform [employer], PA-C.

## 2023-03-25 ENCOUNTER — Telehealth: Payer: Self-pay | Admitting: Physician Assistant

## 2023-03-25 ENCOUNTER — Encounter: Payer: Self-pay | Admitting: Internal Medicine

## 2023-03-25 ENCOUNTER — Telehealth: Payer: Self-pay | Admitting: Nurse Practitioner

## 2023-03-25 NOTE — Telephone Encounter (Signed)
Patient's spouse left voicemail to cancel patient's 9AM palliative appointment. Called and spoke with patient who stated his pain is being managed by another party. Forwarding other concerns to APP and RN. Patient stated he is not supposed to be scheduled for treatment at this time due to fistula.

## 2023-03-26 ENCOUNTER — Inpatient Hospital Stay: Payer: BC Managed Care – PPO | Attending: Internal Medicine

## 2023-03-26 ENCOUNTER — Inpatient Hospital Stay: Payer: BC Managed Care – PPO

## 2023-03-26 ENCOUNTER — Inpatient Hospital Stay (HOSPITAL_BASED_OUTPATIENT_CLINIC_OR_DEPARTMENT_OTHER): Payer: BC Managed Care – PPO | Admitting: Physician Assistant

## 2023-03-26 DIAGNOSIS — K605 Anorectal fistula, unspecified: Secondary | ICD-10-CM | POA: Insufficient documentation

## 2023-03-26 DIAGNOSIS — Z7952 Long term (current) use of systemic steroids: Secondary | ICD-10-CM | POA: Diagnosis not present

## 2023-03-26 DIAGNOSIS — Z7689 Persons encountering health services in other specified circumstances: Secondary | ICD-10-CM

## 2023-03-26 DIAGNOSIS — E559 Vitamin D deficiency, unspecified: Secondary | ICD-10-CM | POA: Insufficient documentation

## 2023-03-26 DIAGNOSIS — Z87442 Personal history of urinary calculi: Secondary | ICD-10-CM | POA: Insufficient documentation

## 2023-03-26 DIAGNOSIS — Z79899 Other long term (current) drug therapy: Secondary | ICD-10-CM | POA: Diagnosis not present

## 2023-03-26 DIAGNOSIS — D492 Neoplasm of unspecified behavior of bone, soft tissue, and skin: Secondary | ICD-10-CM | POA: Diagnosis not present

## 2023-03-26 DIAGNOSIS — R21 Rash and other nonspecific skin eruption: Secondary | ICD-10-CM | POA: Diagnosis not present

## 2023-03-26 DIAGNOSIS — M1990 Unspecified osteoarthritis, unspecified site: Secondary | ICD-10-CM | POA: Insufficient documentation

## 2023-03-26 DIAGNOSIS — Z95828 Presence of other vascular implants and grafts: Secondary | ICD-10-CM

## 2023-03-26 DIAGNOSIS — I10 Essential (primary) hypertension: Secondary | ICD-10-CM | POA: Insufficient documentation

## 2023-03-26 DIAGNOSIS — C61 Malignant neoplasm of prostate: Secondary | ICD-10-CM | POA: Diagnosis present

## 2023-03-26 DIAGNOSIS — I700 Atherosclerosis of aorta: Secondary | ICD-10-CM | POA: Diagnosis not present

## 2023-03-26 DIAGNOSIS — Z5111 Encounter for antineoplastic chemotherapy: Secondary | ICD-10-CM

## 2023-03-26 DIAGNOSIS — R918 Other nonspecific abnormal finding of lung field: Secondary | ICD-10-CM

## 2023-03-26 LAB — CBC WITH DIFFERENTIAL (CANCER CENTER ONLY)
Abs Immature Granulocytes: 0.03 10*3/uL (ref 0.00–0.07)
Basophils Absolute: 0 10*3/uL (ref 0.0–0.1)
Basophils Relative: 0 %
Eosinophils Absolute: 0.2 10*3/uL (ref 0.0–0.5)
Eosinophils Relative: 2 %
HCT: 32.7 % — ABNORMAL LOW (ref 39.0–52.0)
Hemoglobin: 10.4 g/dL — ABNORMAL LOW (ref 13.0–17.0)
Immature Granulocytes: 0 %
Lymphocytes Relative: 6 %
Lymphs Abs: 0.5 10*3/uL — ABNORMAL LOW (ref 0.7–4.0)
MCH: 31 pg (ref 26.0–34.0)
MCHC: 31.8 g/dL (ref 30.0–36.0)
MCV: 97.6 fL (ref 80.0–100.0)
Monocytes Absolute: 0.7 10*3/uL (ref 0.1–1.0)
Monocytes Relative: 9 %
Neutro Abs: 6.7 10*3/uL (ref 1.7–7.7)
Neutrophils Relative %: 83 %
Platelet Count: 329 10*3/uL (ref 150–400)
RBC: 3.35 MIL/uL — ABNORMAL LOW (ref 4.22–5.81)
RDW: 13 % (ref 11.5–15.5)
WBC Count: 8.1 10*3/uL (ref 4.0–10.5)
nRBC: 0 % (ref 0.0–0.2)

## 2023-03-26 LAB — TOTAL PROTEIN, URINE DIPSTICK: Protein, ur: NEGATIVE mg/dL

## 2023-03-26 MED ORDER — HEPARIN SOD (PORK) LOCK FLUSH 100 UNIT/ML IV SOLN
500.0000 [IU] | Freq: Once | INTRAVENOUS | Status: AC
  Administered 2023-03-26: 500 [IU]

## 2023-03-26 MED ORDER — SODIUM CHLORIDE 0.9% FLUSH
10.0000 mL | Freq: Once | INTRAVENOUS | Status: AC
  Administered 2023-03-26: 10 mL

## 2023-03-26 NOTE — Addendum Note (Signed)
Addended by: Charma Igo on: 03/26/2023 11:15 AM   Modules accepted: Orders

## 2023-03-27 ENCOUNTER — Encounter: Payer: Self-pay | Admitting: Internal Medicine

## 2023-04-01 ENCOUNTER — Encounter: Payer: Self-pay | Admitting: Internal Medicine

## 2023-04-02 ENCOUNTER — Telehealth: Payer: Self-pay | Admitting: Medical Oncology

## 2023-04-02 NOTE — Telephone Encounter (Signed)
Wife called and said pt needs appt next week with [facility]. Dr Cristina Gong @ [facility] " ? Had suggested next treatment ".

## 2023-04-06 NOTE — Progress Notes (Unsigned)
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  PRIOR THERAPY: 1) status post resection and the patient has recurrence and October 2021 status post resection again under the care of Dr. Dorris Fetch. 2) Sunitinib 3 7.5 mg p.o. daily.  First dose started April 08, 2021.  Status post 3 months of treatment.  This treatment was discontinued secondary to disease progression. 3) Votrient (pazopanib) 800 mg p.o. daily.  Started August 10, 2021.  Status post 2 months of treatment discontinued secondary to disease progression. 4) status post redo right thoracotomy with resection of pleural tumors and lymph node dissection under the care of Dr. Dorris Fetch on Nov 13, 2021. 5) Systemic chemotherapy with doxorubicin 25 Mg/M2, ifosfamide 2500 Mg/M2 and mesna 2500 Mg/M2 days 1-3 every 3 weeks.  First dose February 12, 2022 at [facility] center.  Status post 2 cycles of treatment discontinued secondary to disease progression. 6) Radiation to the prostate cancer, under the care of Dr. Kathrynn Running. Last day of radiation on 12/17/22 7) Systemic chemotherapy with Temodar 200 Mg/M2 on days 1-7 and day 15-21 with a Avastin 5 Mg/KG on days 8 and 22 every 4 weeks.  Status post 11 cycles . Discontinued due to disease progression  CURRENT THERAPY: Palliative systemic chemotherapy with gemcitabine 900 mg/m on days 1 and 8 and docetaxel 75 to 100 mg/m on day 8 IV every 3 weeks.  He will receive Neulasta support on day 9.  INTERVAL HISTORY: Mathew [name] 62 y.o. male returns to the clinic today for a follow-up visit accompanied by his wife.  The patient was last seen in the clinic on 03/26/23. At that point in time, he had a recent CT  scan that showed disease progression. Therefore, his treatment was discontinued with avastin and temodar. 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 In the interval since last being seen, he saw Dr. Cliffton Asters from general surgery to discuss temporary colostomy. After discussing the risks and benefits, they decided not to pursue this at this time.  For pain, he is being treated with oxycodone in addition to lyrica. He is being weaned off gabapentin. He is being treated by pain management by Dr. Emeline Darling in [place].   He also saw Dr. Prudencio Burly from [facility] to discuss alternative options given his recent disease progression. He recommended docetaxel and gemcitabine.   He is here today to discuss initiating treatment.   Since last being seen, he denies any major changes in his health. The patient denies any fever, chills, night sweats, or unexplained weight loss. His energy is "good". He is not able to exercise as much due to the rectal pain.  He occasionally will have mild rectal bleeding with bowel movements.  However, he states is only a small amount of blood.  He denies any other abnormal bleeding or bruising such as epistaxis, gingival bleeding, hemoptysis, or hematemesis.  He states his breathing is okay".  He states is not getting any worse to his knowledge.  Denies any significant cough or chest pain. Denies any headaches.  Denies any nausea, vomiting, or diarrhea.he has been experiencing some mild constipation due to the pain medication.  He is taking Colace and MiraLAX approximately twice a week if  needed. He discussed with Dr. Cliffton Asters the importance of avoiding constipation.   He is here today for evaluation and to discuss next steps.   MEDICAL HISTORY: Past Medical History:  Diagnosis Date   Anemia    Clubbing of nails    Deafness in left ear    Encounter for antineoplastic chemotherapy    History of kidney stones    Hypertension    Hypoglycemia due to  neoplasm    Malignant neoplasm prostate [facility]) 07/2022   urologist--- dr pace;   bx 01/ 2024, Gleason 4+3   Malignant solitary fibrous neoplasm (HCC) 11/2015   oncologist--- @ [facility] Dr R. 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  MEDICATIONS:  Current Outpatient Medications  Medication Sig Dispense Refill   docusate sodium (COLACE) 100 MG capsule Take 1 capsule (100 mg total) by mouth every 12 (twelve) hours. (Patient taking differently: Take 100 mg by mouth daily.) 60 capsule 0   folic acid (FOLVITE) 400 MCG tablet Take 400 mcg by mouth daily.     gabapentin (NEURONTIN) 100 MG capsule Take 300 mg by mouth 2 (two) times daily. (Patient not taking: Reported on 03/26/2023)     gabapentin (NEURONTIN) 600 MG tablet Take 600 mg by mouth 2 (two) times daily.     Multiple Vitamin (MULTIVITAMIN) capsule Take 1 capsule by mouth daily.     oxyCODONE (ROXICODONE) 15 MG immediate release tablet Take 1 tablet (15 mg total) by mouth every 6 (six) hours as needed for pain. 60 tablet 0   polyethylene glycol (MIRALAX / GLYCOLAX) 17 g packet Take 17 g by mouth 2 (two) times daily.     pregabalin (LYRICA) 100 MG capsule Take 100 mg by mouth 2 (two) times daily.     tamsulosin (FLOMAX) 0.4 MG CAPS capsule Take 0.4 mg by mouth at bedtime. Urology started 8/27     No current facility-administered medications for this visit.   Facility-Administered Medications Ordered in Other Visits  Medication Dose Route Frequency Provider Last Rate Last Admin   sodium  phosphate (FLEET) 7-19 GM/118ML enema 1 enema  1 enema Rectal Once Noel Christmas, MD       sodium phosphate (FLEET) 7-19 GM/118ML enema 1 enema  1 enema Rectal Once Noel Christmas, MD        SURGICAL HISTORY:  Past Surgical History:  Procedure Laterality Date   GOLD SEED IMPLANT N/A 10/21/2022   Procedure: GOLD SEED IMPLANT;  Surgeon: Noel Christmas, MD;  Location: [facility];  Service: Urology;  Laterality: N/A;   INTERCOSTAL NERVE BLOCK Right 04/27/2020   Procedure: INTERCOSTAL NERVE BLOCK;  Surgeon: Loreli Slot, MD;  Location: [facility] OR;  Service: Thoracic;  Laterality: Right;   KNEE CARTILAGE SURGERY Left 1976   RESECTION OF MEDIASTINAL MASS Right 01/23/2016   @[facility]  by dr Dorris Fetch;   resection chest wall mass w/ en bloc wedge resection of RLL  RESECTION OF MEDIASTINAL MASS Right 11/13/2021   Procedure: RESECTION OF PLEURAL TUMORS;  Surgeon: Loreli Slot, MD;  Location: [facility] OR;  Service: Thoracic;  Laterality: Right;   SPACE OAR INSTILLATION N/A 10/21/2022   Procedure: SPACE OAR INSTILLATION;  Surgeon: Noel Christmas, MD;  Location: [facility];  Service: Urology;  Laterality: N/A;   THORACOTOMY Right 04/27/2020   Procedure: THORACOTOMY;  Surgeon: Loreli Slot, MD;  Location: [facility] OR;  Service: Thoracic;  Laterality: Right;   THORACOTOMY Right 11/13/2021   Procedure: REDO THORACOTOMY;  Surgeon: Loreli Slot, MD;  Location: [facility] OR;  Service: Thoracic;  Laterality: Right;   TONSILLECTOMY     child   VIDEO BRONCHOSCOPY WITH ENDOBRONCHIAL ULTRASOUND N/A 12/17/2015   Procedure: VIDEO BRONCHOSCOPY WITH ENDOBRONCHIAL ULTRASOUND;  Surgeon: Loreli Slot, MD;  Location: [facility] OR;  Service: Thoracic;  Laterality: N/A;    REVIEW OF SYSTEMS:   Review of Systems  Constitutional: Negative for appetite change, chills, fatigue, fever and unexpected weight change.  HENT:   Negative for mouth sores, nosebleeds, sore throat and  trouble swallowing.   Eyes: Negative for eye problems and icterus.  Respiratory: Negative for cough, hemoptysis, shortness of breath and wheezing.   Cardiovascular: Negative for chest pain and leg swelling.  Gastrointestinal: Negative for abdominal pain, constipation, diarrhea, nausea and vomiting.  Genitourinary: Negative for bladder incontinence, difficulty urinating, dysuria, frequency and hematuria.   Musculoskeletal: Negative for back pain, gait problem, neck pain and neck stiffness.  Skin: Negative for itching and rash.  Neurological: Negative for dizziness, extremity weakness, gait problem, headaches, light-headedness and seizures.  Hematological: Negative for adenopathy. Does not bruise/bleed easily.  Psychiatric/Behavioral: Negative for confusion, depression and sleep disturbance. The patient is not nervous/anxious.     PHYSICAL EXAMINATION:  There were no vitals taken for this visit.  ECOG PERFORMANCE STATUS: {CHL ONC ECOG [id number]  Physical Exam  Constitutional: Oriented to person, place, and time and well-developed, well-nourished, and in no distress. No distress.  HENT:  Head: Normocephalic and atraumatic.  Mouth/Throat: Oropharynx is clear and moist. No oropharyngeal exudate.  Eyes: Conjunctivae are normal. Right eye exhibits no discharge. Left eye exhibits no discharge. No scleral icterus.  Neck: Normal range of motion. Neck supple.  Cardiovascular: Normal rate, regular rhythm, normal heart sounds and intact distal pulses.   Pulmonary/Chest: Effort normal and breath sounds normal. No respiratory distress. No wheezes. No rales.  Abdominal: Soft. Bowel sounds are normal. Exhibits no distension and no mass. There is no tenderness.  Musculoskeletal: Normal range of motion. Exhibits no edema.  Lymphadenopathy:    No cervical adenopathy.  Neurological: Alert and oriented to person, place, and time. Exhibits normal muscle tone. Gait normal. Coordination normal.  Skin:  Skin is warm and dry. No rash noted. Not diaphoretic. No erythema. No pallor.  Psychiatric: Mood, memory and judgment normal.  Vitals reviewed.  LABORATORY DATA: Lab Results  Component Value Date   WBC 8.1 03/26/2023   HGB 10.4 (L) 03/26/2023   HCT 32.7 (L) 03/26/2023   MCV 97.6 03/26/2023   PLT 329 03/26/2023      Chemistry      Component Value Date/Time   NA 133 (L) 03/09/2023 0526   K 3.8 03/09/2023 0526   CL 99 03/09/2023 0526   CO2 26 03/09/2023 0526   BUN 16 03/09/2023 0526   CREATININE 0.95 03/09/2023 0526   CREATININE 1.03 02/26/2023 0822      Component Value Date/Time   CALCIUM 8.8 (  L) 03/09/2023 0526   ALKPHOS 70 03/05/2023 1013   AST 14 (L) 03/05/2023 1013   AST 15 02/26/2023 0822   ALT 11 03/05/2023 1013   ALT 12 02/26/2023 0822   BILITOT 0.7 03/05/2023 1013   BILITOT 0.7 02/26/2023 3086       RADIOGRAPHIC STUDIES:  CT CHEST ABDOMEN PELVIS W CONTRAST  Result Date: 03/23/2023 CLINICAL DATA:  Prostate cancer, and metastatic sarcoma involving the right lung pleura. Prior radiation therapy. Current chemotherapy. Anorectal fistula. Radiation therapy to the prostate region completed in July 2024. * Tracking Code: BO * EXAM: CT CHEST, ABDOMEN, AND PELVIS WITH CONTRAST TECHNIQUE: Multidetector CT imaging of the chest, abdomen and pelvis was performed following the standard protocol during bolus administration of intravenous contrast. RADIATION DOSE REDUCTION: This exam was performed according to the departmental dose-optimization program which includes automated exposure control, adjustment of the mA and/or kV according to patient size and/or use of iterative reconstruction technique. CONTRAST:  OMNIPAQUE IOHEXOL 300 MG/ML  SOLN COMPARISON:  Multiple exams, including pelvic CT 03/05/2023 and CT images from [facility] dated 01/21/2023 FINDINGS: CT CHEST FINDINGS Cardiovascular: Right Port-A-Cath tip: Right atrium. Mild left anterior descending  coronary atheromatous vascular disease. Mediastinum/Nodes: Some of the extensive right pleural masses may be slightly invading the mediastinum. For example, an anterior pleural or mediastinal mass measures 4.4 by 2.4 cm on image 31 series 2, previously same. Similarly tumor along the azygoesophageal recess could be invading the mediastinum adjacent to the esophagus. Lungs/Pleura: There is evidence of prior wedge resections including the right lower lobe and right middle lobe. Extensive right pleural metastatic disease as on prior exams, increased from previous, with substantial and increased invasion of the chest wall. For example, the 9.0 by 7.7 cm right posterior pleural mass on image 32 series 2 shows increased destruction of the right ninth rib, previously the rib was only partially eroded and currently there is a segment of complete destruction of the rib and a greater degree of invasion into the soft tissues of the chest wall. This tumor previously measured 8.3 by 5.3 cm. Similarly, tumor extending in the seventh and ninth intercostal space on image 53 series 2 is substantially increased. Stable 3 by 4 mm left lower lobe nodule on image 73 series 4. Dependent 1.1 by 0.4 cm filling defect in the posterior trachea on image 25 series 4, probably mucous. Musculoskeletal: As noted above there is substantially increased destruction of the right ninth rib compared to previous. Other rib deformities related to prior trauma. CT ABDOMEN PELVIS FINDINGS Hepatobiliary: Tumor along the costophrenic angle causes substantial concavity of the adjacent liver margin as before. I do not see obvious invasion of the hepatic parenchyma along the diaphragm. A lesion posteriorly in the lateral segment left hepatic lobe measures 4.7 by 3.9 cm and has increased extrahepatic component, previously 4.0 by 2.7 cm. Contracted gallbladder. Pancreas: Unremarkable Spleen: Unremarkable Adrenals/Urinary Tract: Benign right renal cysts. No  further imaging workup of these lesions is indicated. Two punctate nonobstructive left kidney lower pole calculi. Mild urinary bladder wall thickening, cystitis is not excluded. Adrenal glands unremarkable. Stomach/Bowel: Prominent stool throughout the colon favors constipation. There continues to be a gas collection tracking along the left and inferior margin of the prostate gland, associated with an anterior lower rectal fistula as on image 15 of series 2. This is not substantially changed from 02/28/2023. Vascular/Lymphatic: Mild aortoiliac atheromatous vascular disease. No pathologic adenopathy identified. Reproductive: Fiducials along the prostate gland. Peri  prostatic abscess due to the fistula from the rectum, similar to prior exams. Other: Low-level subcutaneous and mesenteric edema similar to prior. Edema along tissue planes in the lower pelvis possibly from prior radiation therapy. Paucity of intra-abdominal adipose tissue. Musculoskeletal: Bridging spurring of both sacroiliac joints. Loss of disc height at L4-5 and L5-S1. 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 IMPRESSION: 1. Substantial progression of extensive right pleural metastatic disease, with increased destruction of the right ninth rib and increased invasion into the chest wall. 2. A lesion posteriorly in the lateral segment left hepatic lobe has increased extrahepatic component, currently measuring 4.7 by 3.9 cm, previously 4.0 by 2.7 cm. 3. Similar appearance of the anterior fistula from the lower rectum extending to a gas collection in the left and inferior periprostatic space. 4. Mild urinary bladder wall thickening, cystitis is not excluded. 5. Prominent stool throughout the colon favors constipation. 6. 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 7. Mild left  anterior descending coronary atheromatous vascular disease. 8. Aortic atherosclerosis. Aortic Atherosclerosis (ICD10-I70.0). Electronically Signed   By: Gaylyn Rong M.D.   On: 03/23/2023 14:40     ASSESSMENT/PLAN:  This is a very pleasant 62 year old male with  1) recurrent solitary fibrous tumor of the right lung.  This was initially diagnosed in June 2017.  2) prostate adenocarcinoma with Gleason score of 7 (4+3) diagnosed in February 2024 followed by urology.   He is status post resection.  He had recurrence in October 2021 and underwent re-resection under the care of Dr. Dorris Fetch.  He had been on observation as there is no role for adjuvant therapy for his condition.  He was seen by Dr. Lin Givens at [facility] who recommended observation and consideration of treatment with Sunitinib followed by surgery if the patient had disease recurrence.    The patient had a new 1.4 cm nodule posteriorly and the patient underwent treatment with sunitinib 37.5 mg p.o. daily.  He was on this for 3 months. He had been tolerating this fairly well except for hypertension.  Unfortunately scan showed evidence of disease progression with interval development of multiple new pleural-based nodules in the right hemithorax measuring up to 2.9 cm which is concerning for recurrent/metastatic disease.    He was then seen by Dr. Dorris Fetch who offered surgical resection.  Dr. Lin Givens recommended neoadjuvant treatment with pazopanib for 8 weeks followed by surgical resection if he had improvement in his disease followed by continued adjuvant treatment for another 6 to 8 months.  Unfortunately, after 2 months of treatment the CT scan showed clear evidence of disease progression with enlarging right pleural-based masses.  He was referred to Dr. Dorris Fetch who underwent redo right thoracotomy with resection of the pleural tumors and lymph node dissection and the final pathology showed morphologic features  consistent with high-grade pleomorphic sarcoma and many of the removed areas as well as the visceral pleura.  Molecular studies by foundation 1 and PD-L1 did not show any actionable mutations and his PD-L1 expression was negative.    The patient then saw Dr. Waymon Amato at [facility] who recommended inpatient systemic chemotherapy with doxorubicin, ifosfamide, and mesna IV every 3 weeks.  He had a 2D echo as well as a Port-A-Cath placed.  This was discontinued due to evidence of disease progression.    Dr. Waymon Amato recommended for  the patient treatment with Temodar 200 Mg/M2 on days 1-7 and 15-21 in addition to Avastin 5 Mg/M2 on days 8 and 22 every 4 weeks.  He is status post 11 cycles.    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 His avastin can cause delayed wound healing. Additionally, he was found to have disease progression so treatment was discontinued.   He saw Dr. Cherylann Ratel last week who recommended gemcitabine 900 mg/m IV on days 1 and 8 and docetaxel 75100 mg/m on day 8 with Neulasta support. On day 9. For outpatient premedications, he recommenced dexamethasone 8 mg p.o. twice a day the day before and the day after day 8 of chemotherapy.   The patient understands that that chemotherapy can cause delayed wound healing.  ***Defer decision about starting chemotherapy to Dr. Arbutus Ped.  If chemo starts later than 30 days after 03/20/2023 imaging then new baseline imaging should be obtained.  Repeat imaging every 3 cycles.  Continue for 6 cycles before if stable then repeat imaging after 6 cycles before***  The patient was seen with Dr. Berna Bue. Dr. Arbutus Ped discussed this with the patient. He is in agreement with this plan and is expected to start on ***  The adverse side effects of treatment were discussed including but not limited to alopecia, risk of infection, peripheral neuropathy, nausea, vomiting, myelosuppression, kidney, and liver dysfunction.   We will see him back in 2  weeks for evaluation and repeat blood work and to manage any adverse side effects of treatment.   ***I wil send him for a prescription for Decadron 4 mg 2 tablets twice a day the day before and the day after chemotherapy.  Antiemetics?  We will arrange for labs and a weekly basis.  The patient was advised to call immediately if se has any concerning symptoms in the interval. The patient voices understanding of current disease status and treatment options and is in agreement with the current care plan. All questions were answered. The patient knows to call the clinic with any problems, questions or concerns. We can certainly see the patient much sooner if necessary   No orders of the defined types were placed in this encounter.    I spent {CHL ONC TIME VISIT - ZOXWR:[id number]} counseling the patient face to face. The total time spent in the appointment was {CHL ONC TIME VISIT - BJYNW:[id number]}.  Mathew Puleo L [place] Folden, PA-C 04/06/23

## 2023-04-07 ENCOUNTER — Encounter: Payer: Self-pay | Admitting: Internal Medicine

## 2023-04-07 ENCOUNTER — Inpatient Hospital Stay: Payer: BC Managed Care – PPO

## 2023-04-07 ENCOUNTER — Inpatient Hospital Stay (HOSPITAL_BASED_OUTPATIENT_CLINIC_OR_DEPARTMENT_OTHER): Payer: BC Managed Care – PPO | Admitting: Physician Assistant

## 2023-04-07 ENCOUNTER — Other Ambulatory Visit: Payer: Self-pay | Admitting: Physician Assistant

## 2023-04-07 ENCOUNTER — Other Ambulatory Visit: Payer: Self-pay

## 2023-04-07 VITALS — BP 133/79 | HR 100 | Temp 98.7°F | Resp 16 | Wt 163.2 lb

## 2023-04-07 DIAGNOSIS — C61 Malignant neoplasm of prostate: Secondary | ICD-10-CM

## 2023-04-07 DIAGNOSIS — D492 Neoplasm of unspecified behavior of bone, soft tissue, and skin: Secondary | ICD-10-CM

## 2023-04-07 DIAGNOSIS — Z7189 Other specified counseling: Secondary | ICD-10-CM | POA: Insufficient documentation

## 2023-04-07 DIAGNOSIS — Z5111 Encounter for antineoplastic chemotherapy: Secondary | ICD-10-CM

## 2023-04-07 LAB — CBC WITH DIFFERENTIAL (CANCER CENTER ONLY)
Abs Immature Granulocytes: 0.05 10*3/uL (ref 0.00–0.07)
Basophils Absolute: 0 10*3/uL (ref 0.0–0.1)
Basophils Relative: 0 %
Eosinophils Absolute: 0.1 10*3/uL (ref 0.0–0.5)
Eosinophils Relative: 1 %
HCT: 32.6 % — ABNORMAL LOW (ref 39.0–52.0)
Hemoglobin: 10.3 g/dL — ABNORMAL LOW (ref 13.0–17.0)
Immature Granulocytes: 1 %
Lymphocytes Relative: 6 %
Lymphs Abs: 0.5 10*3/uL — ABNORMAL LOW (ref 0.7–4.0)
MCH: 30.2 pg (ref 26.0–34.0)
MCHC: 31.6 g/dL (ref 30.0–36.0)
MCV: 95.6 fL (ref 80.0–100.0)
Monocytes Absolute: 0.9 10*3/uL (ref 0.1–1.0)
Monocytes Relative: 9 %
Neutro Abs: 7.9 10*3/uL — ABNORMAL HIGH (ref 1.7–7.7)
Neutrophils Relative %: 83 %
Platelet Count: 320 10*3/uL (ref 150–400)
RBC: 3.41 MIL/uL — ABNORMAL LOW (ref 4.22–5.81)
RDW: 13.3 % (ref 11.5–15.5)
WBC Count: 9.5 10*3/uL (ref 4.0–10.5)
nRBC: 0 % (ref 0.0–0.2)

## 2023-04-07 LAB — CMP (CANCER CENTER ONLY)
ALT: 9 U/L (ref 0–44)
AST: 15 U/L (ref 15–41)
Albumin: 3.1 g/dL — ABNORMAL LOW (ref 3.5–5.0)
Alkaline Phosphatase: 76 U/L (ref 38–126)
Anion gap: 6 (ref 5–15)
BUN: 14 mg/dL (ref 8–23)
CO2: 32 mmol/L (ref 22–32)
Calcium: 9.6 mg/dL (ref 8.9–10.3)
Chloride: 97 mmol/L — ABNORMAL LOW (ref 98–111)
Creatinine: 1.07 mg/dL (ref 0.61–1.24)
GFR, Estimated: >60 mL/min (ref >60)
Glucose, Bld: 120 mg/dL — ABNORMAL HIGH (ref 70–99)
Potassium: 4.4 mmol/L (ref 3.5–5.1)
Sodium: 135 mmol/L (ref 135–145)
Total Bilirubin: 0.4 mg/dL (ref 0.3–1.2)
Total Protein: 6.8 g/dL (ref 6.5–8.1)

## 2023-04-07 MED ORDER — DEXAMETHASONE 4 MG PO TABS
ORAL_TABLET | ORAL | 1 refills | Status: DC
Start: 2023-04-13 — End: 2023-07-06

## 2023-04-07 MED ORDER — DEXAMETHASONE 4 MG PO TABS
ORAL_TABLET | ORAL | 2 refills | Status: DC
Start: 2023-04-07 — End: 2023-04-07

## 2023-04-07 MED ORDER — ONDANSETRON HCL 8 MG PO TABS: 8.0000 mg | ORAL_TABLET | Freq: Three times a day (TID) | ORAL | 1 refills | Status: DC | PRN

## 2023-04-07 NOTE — Progress Notes (Signed)
DISCONTINUE ON PATHWAY REGIMEN - Sarcoma     A cycle is every 28 days:     Bevacizumab-xxxx      Temozolomide   **Always confirm dose/schedule in your pharmacy ordering system**  REASON: Disease Progression PRIOR TREATMENT: [id number]: Temozolomide 150 mg/m2 PO D1-7, 15-21 + Bevacizumab 5 mg/kg D8,22 q28 Days TREATMENT RESPONSE: Stable Disease (SD)  START OFF PATHWAY REGIMEN - Sarcoma   [id number]:Docetaxel 75 mg/m2 D8 + Gemcitabine 900 mg/m2 D1,8 q21 Days:   A cycle is every 21 days:     Gemcitabine      Docetaxel      Pegfilgrastim-xxxx   **Always confirm dose/schedule in your pharmacy ordering system**  Patient Characteristics: Other Sarcoma Subtypes, Unresectable / Metastatic, Solitary Fibrous Tumor Histology/Anatomic Site: Solitary Fibrous Tumor  Intent of Therapy: Non-Curative / Palliative Intent, Discussed with Patient

## 2023-04-09 ENCOUNTER — Inpatient Hospital Stay: Payer: BC Managed Care – PPO

## 2023-04-09 ENCOUNTER — Inpatient Hospital Stay: Payer: BC Managed Care – PPO | Admitting: Internal Medicine

## 2023-04-10 ENCOUNTER — Encounter: Payer: Self-pay | Admitting: Internal Medicine

## 2023-04-11 ENCOUNTER — Other Ambulatory Visit: Payer: Self-pay

## 2023-04-13 ENCOUNTER — Telehealth: Payer: Self-pay | Admitting: Internal Medicine

## 2023-04-13 ENCOUNTER — Encounter: Payer: Self-pay | Admitting: Internal Medicine

## 2023-04-13 NOTE — Telephone Encounter (Signed)
Called patient regarding all upcoming appointments, left a voicemail.

## 2023-04-14 ENCOUNTER — Inpatient Hospital Stay: Payer: BC Managed Care – PPO

## 2023-04-14 VITALS — BP 106/65 | HR 81 | Temp 98.7°F | Resp 18 | Wt 164.1 lb

## 2023-04-14 DIAGNOSIS — C61 Malignant neoplasm of prostate: Secondary | ICD-10-CM | POA: Diagnosis not present

## 2023-04-14 DIAGNOSIS — D492 Neoplasm of unspecified behavior of bone, soft tissue, and skin: Secondary | ICD-10-CM

## 2023-04-14 LAB — CMP (CANCER CENTER ONLY)
ALT: 9 U/L (ref 0–44)
AST: 14 U/L — ABNORMAL LOW (ref 15–41)
Albumin: 3 g/dL — ABNORMAL LOW (ref 3.5–5.0)
Alkaline Phosphatase: 71 U/L (ref 38–126)
Anion gap: 5 (ref 5–15)
BUN: 15 mg/dL (ref 8–23)
CO2: 31 mmol/L (ref 22–32)
Calcium: 9.4 mg/dL (ref 8.9–10.3)
Chloride: 99 mmol/L (ref 98–111)
Creatinine: 1.08 mg/dL (ref 0.61–1.24)
GFR, Estimated: >60 mL/min (ref >60)
Glucose, Bld: 129 mg/dL — ABNORMAL HIGH (ref 70–99)
Potassium: 3.9 mmol/L (ref 3.5–5.1)
Sodium: 135 mmol/L (ref 135–145)
Total Bilirubin: 0.6 mg/dL (ref 0.3–1.2)
Total Protein: 6.6 g/dL (ref 6.5–8.1)

## 2023-04-14 LAB — CBC WITH DIFFERENTIAL (CANCER CENTER ONLY)
Abs Immature Granulocytes: 0.03 10*3/uL (ref 0.00–0.07)
Basophils Absolute: 0 10*3/uL (ref 0.0–0.1)
Basophils Relative: 0 %
Eosinophils Absolute: 0.1 10*3/uL (ref 0.0–0.5)
Eosinophils Relative: 1 %
HCT: 31.1 % — ABNORMAL LOW (ref 39.0–52.0)
Hemoglobin: 9.7 g/dL — ABNORMAL LOW (ref 13.0–17.0)
Immature Granulocytes: 0 %
Lymphocytes Relative: 5 %
Lymphs Abs: 0.5 10*3/uL — ABNORMAL LOW (ref 0.7–4.0)
MCH: 29.2 pg (ref 26.0–34.0)
MCHC: 31.2 g/dL (ref 30.0–36.0)
MCV: 93.7 fL (ref 80.0–100.0)
Monocytes Absolute: 0.8 10*3/uL (ref 0.1–1.0)
Monocytes Relative: 8 %
Neutro Abs: 8.2 10*3/uL — ABNORMAL HIGH (ref 1.7–7.7)
Neutrophils Relative %: 86 %
Platelet Count: 334 10*3/uL (ref 150–400)
RBC: 3.32 MIL/uL — ABNORMAL LOW (ref 4.22–5.81)
RDW: 14 % (ref 11.5–15.5)
WBC Count: 9.6 10*3/uL (ref 4.0–10.5)
nRBC: 0 % (ref 0.0–0.2)

## 2023-04-14 MED ORDER — SODIUM CHLORIDE 0.9 % IV SOLN
INTRAVENOUS | Status: DC
Start: 2023-04-14 — End: 2023-04-14

## 2023-04-14 MED ORDER — SODIUM CHLORIDE 0.9 % IV SOLN
900.0000 mg/m2 | Freq: Once | INTRAVENOUS | Status: AC
  Administered 2023-04-14: 1711 mg via INTRAVENOUS
  Filled 2023-04-14: qty 45

## 2023-04-14 MED ORDER — PROCHLORPERAZINE MALEATE 10 MG PO TABS
10.0000 mg | ORAL_TABLET | Freq: Once | ORAL | Status: AC
  Administered 2023-04-14: 10 mg via ORAL
  Filled 2023-04-14: qty 1

## 2023-04-14 MED ORDER — HEPARIN SOD (PORK) LOCK FLUSH 100 UNIT/ML IV SOLN
500.0000 [IU] | Freq: Once | INTRAVENOUS | Status: AC | PRN
  Administered 2023-04-14: 500 [IU]

## 2023-04-14 MED ORDER — SODIUM CHLORIDE 0.9% FLUSH
10.0000 mL | INTRAVENOUS | Status: DC | PRN
  Administered 2023-04-14: 10 mL

## 2023-04-14 NOTE — Patient Instructions (Addendum)
[facility]  Discharge Instructions: Thank you for choosing [facility] to provide your oncology and hematology care.   If you have a lab appointment with the Cancer Center, please go directly to the Cancer Center and check in at the registration area.   Wear comfortable clothing and clothing appropriate for easy access to any Portacath or PICC line.   We strive to give you quality time with your provider. You may need to reschedule your appointment if you arrive late (15 or more minutes).  Arriving late affects you and other patients whose appointments are after yours.  Also, if you miss three or more appointments without notifying the office, you may be dismissed from the clinic at the provider's discretion.      For prescription refill requests, have your pharmacy contact our office and allow 72 hours for refills to be completed.    Today you received the following chemotherapy and/or immunotherapy agents: Gemcitabine.       To help prevent nausea and vomiting after your treatment, we encourage you to take your nausea medication as directed.  BELOW ARE SYMPTOMS THAT SHOULD BE REPORTED IMMEDIATELY: *FEVER GREATER THAN 100.4 F (38 C) OR HIGHER *CHILLS OR SWEATING *NAUSEA AND VOMITING THAT IS NOT CONTROLLED WITH YOUR NAUSEA MEDICATION *UNUSUAL SHORTNESS OF BREATH *UNUSUAL BRUISING OR BLEEDING *URINARY PROBLEMS (pain or burning when urinating, or frequent urination) *BOWEL PROBLEMS (unusual diarrhea, constipation, pain near the anus) TENDERNESS IN MOUTH AND THROAT WITH OR WITHOUT PRESENCE OF ULCERS (sore throat, sores in mouth, or a toothache) UNUSUAL RASH, SWELLING OR PAIN  UNUSUAL VAGINAL DISCHARGE OR ITCHING   Items with * indicate a potential emergency and should be followed up as soon as possible or go to the Emergency Department if any problems should occur.  Please show the CHEMOTHERAPY ALERT CARD or IMMUNOTHERAPY ALERT CARD at  check-in to the Emergency Department and triage nurse.  Should you have questions after your visit or need to cancel or reschedule your appointment, please contact [facility]  Dept: [phone]  and follow the prompts.  Office hours are 8:00 a.m. to 4:30 p.m. Monday - Friday. Please note that voicemails left after 4:00 p.m. may not be returned until the following business day.  We are closed weekends and major holidays. You have access to a nurse at all times for urgent questions. Please call the main number to the clinic Dept: [phone] and follow the prompts.   For any non-urgent questions, you may also contact your provider using MyChart. We now offer e-Visits for anyone 45 and older to request care online for non-urgent symptoms. For details visit [email or web address].   Also download the MyChart app! Go to the app store, search "MyChart", open the app, select [place], and log in with your MyChart username and password.  Gemcitabine Injection What is this medication? GEMCITABINE (jem SYE ta been) treats some types of cancer. It works by slowing down the growth of cancer cells. This medicine may be used for other purposes; ask your health care provider or pharmacist if you have questions. COMMON BRAND NAME(S): Gemzar, Infugem What should I tell my care team before I take this medication? They need to know if you have any of these conditions: Blood disorders Infection Kidney disease Liver disease Lung or breathing disease, such as asthma or COPD Recent or ongoing radiation therapy An unusual or allergic reaction to gemcitabine, other medications, foods, dyes,  or preservatives If you or your partner are pregnant or trying to get pregnant Breast-feeding How should I use this medication? This medication is injected into a vein. It is given by your care team in a hospital or clinic setting. Talk to your care team about the use of this  medication in children. Special care may be needed. Overdosage: If you think you have taken too much of this medicine contact a poison control center or emergency room at once. NOTE: This medicine is only for you. Do not share this medicine with others. What if I miss a dose? Keep appointments for follow-up doses. It is important not to miss your dose. Call your care team if you are unable to keep an appointment. What may interact with this medication? Interactions have not been studied. This list may not describe all possible interactions. Give your health care provider a list of all the medicines, herbs, non-prescription drugs, or dietary supplements you use. Also tell them if you smoke, drink alcohol, or use illegal drugs. Some items may interact with your medicine. What should I watch for while using this medication? Your condition will be monitored carefully while you are receiving this medication. This medication may make you feel generally unwell. This is not uncommon, as chemotherapy can affect healthy cells as well as cancer cells. Report any side effects. Continue your course of treatment even though you feel ill unless your care team tells you to stop. In some cases, you may be given additional medications to help with side effects. Follow all directions for their use. This medication may increase your risk of getting an infection. Call your care team for advice if you get a fever, chills, sore throat, or other symptoms of a cold or flu. Do not treat yourself. Try to avoid being around people who are sick. This medication may increase your risk to bruise or bleed. Call your care team if you notice any unusual bleeding. Be careful brushing or flossing your teeth or using a toothpick because you may get an infection or bleed more easily. If you have any dental work done, tell your dentist you are receiving this medication. Avoid taking medications that contain aspirin, acetaminophen,  ibuprofen, naproxen, or ketoprofen unless instructed by your care team. These medications may hide a fever. Talk to your care team if you or your partner wish to become pregnant or think you might be pregnant. This medication can cause serious birth defects if taken during pregnancy and for 6 months after the last dose. A negative pregnancy test is required before starting this medication. A reliable form of contraception is recommended while taking this medication and for 6 months after the last dose. Talk to your care team about effective forms of contraception. Do not father a child while taking this medication and for 3 months after the last dose. Use a condom while having sex during this time period. Do not breastfeed while taking this medication and for at least 1 week after the last dose. This medication may cause infertility. Talk to your care team if you are concerned about your fertility. What side effects may I notice from receiving this medication? Side effects that you should report to your care team as soon as possible: Allergic reactions--skin rash, itching, hives, swelling of the face, lips, tongue, or throat Capillary leak syndrome--stomach or muscle pain, unusual weakness or fatigue, feeling faint or lightheaded, decrease in the amount of urine, swelling of the ankles, hands, or feet, trouble breathing  Infection--fever, chills, cough, sore throat, wounds that don't heal, pain or trouble when passing urine, general feeling of discomfort or being unwell Liver injury--right upper belly pain, loss of appetite, nausea, light-colored stool, dark yellow or brown urine, yellowing skin or eyes, unusual weakness or fatigue Low red blood cell level--unusual weakness or fatigue, dizziness, headache, trouble breathing Lung injury--shortness of breath or trouble breathing, cough, spitting up blood, chest pain, fever Stomach pain, bloody diarrhea, pale skin, unusual weakness or fatigue, decrease in  the amount of urine, which may be signs of hemolytic uremic syndrome Sudden and severe headache, confusion, change in vision, seizures, which may be signs of posterior reversible encephalopathy syndrome (PRES) Unusual bruising or bleeding Side effects that usually do not require medical attention (report to your care team if they continue or are bothersome): Diarrhea Drowsiness Hair loss Nausea Pain, redness, or swelling with sores inside the mouth or throat Vomiting This list may not describe all possible side effects. Call your doctor for medical advice about side effects. You may report side effects to FDA at 1-800-FDA-1088. Where should I keep my medication? This medication is given in a hospital or clinic. It will not be stored at home. NOTE: This sheet is a summary. It may not cover all possible information. If you have questions about this medicine, talk to your doctor, pharmacist, or health care provider.  2024 [employer]/Gold Standard (2021-10-08 00:00:00)

## 2023-04-15 ENCOUNTER — Telehealth: Payer: Self-pay

## 2023-04-15 NOTE — Telephone Encounter (Signed)
-----   Message from Nurse Threasa Beards sent at 04/14/2023 11:40 AM EDT ----- Regarding: Dr Arbutus Ped pt, first time Gemcitabine. Dr Arbutus Ped pt came in 10/29 for first time Gemcitabine. Tolerated well. Needs call back.

## 2023-04-15 NOTE — Telephone Encounter (Signed)
LM for patient that this nurse was calling to see how they were doing after their treatment. Please call back to Dr. Mohamed's nurse at [phone] if they have any questions or concerns regarding the treatment.  

## 2023-04-17 ENCOUNTER — Encounter: Payer: Self-pay | Admitting: Internal Medicine

## 2023-04-17 NOTE — Progress Notes (Unsigned)
[facility] OFFICE PROGRESS NOTE  [name], MD 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  PRIOR THERAPY:  1) status post resection and the patient has recurrence and October 2021 status post resection again under the care of Dr. Dorris Fetch. 2) Sunitinib 3 7.5 mg p.o. daily.  First dose started April 08, 2021.  Status post 3 months of treatment.  This treatment was discontinued secondary to disease progression. 3) Votrient (pazopanib) 800 mg p.o. daily.  Started August 10, 2021.  Status post 2 months of treatment discontinued secondary to disease progression. 4) status post redo right thoracotomy with resection of pleural tumors and lymph node dissection under the care of Dr. Dorris Fetch on Nov 13, 2021. 5) Systemic chemotherapy with doxorubicin 25 Mg/M2, ifosfamide 2500 Mg/M2 and mesna 2500 Mg/M2 days 1-3 every 3 weeks.  First dose February 12, 2022 at [facility] center.  Status post 2 cycles of treatment discontinued secondary to disease progression. 6) Radiation to the prostate cancer, under the care of Dr. Kathrynn Running. Last day of radiation on 12/17/22 7) Systemic chemotherapy with Temodar 200 Mg/M2 on days 1-7 and day 15-21 with a Avastin 5 Mg/KG on days 8 and 22 every 4 weeks.  Status post 11 cycles . Discontinued due to disease progression  CURRENT THERAPY: Palliative systemic chemotherapy with gemcitabine 900 mg/m on days 1 and 8 and docetaxel 75 mg/m on day 8 IV every 3 weeks. He will receive Neulasta support on day 9. First dose on 04/07/23.   INTERVAL HISTORY: DINA MOBLEY 62 y.o. male returns to the clinic today for a follow-up visit.  The patient was last seen in the clinic on 04/07/2023.  The patient was recently found to have  evidence of disease progression.  Therefore,  his treatment was switched to IV chemotherapy with gemcitabine and docetaxel.  He is status post day 1 cycle 1 and he tolerated it fairly well except for fatigue.  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  For pain he is being managed by Dr. Emeline Darling in [place] with oxycodone, Marlowe Kays, in addition to Lyrica. He feels like his pain is about stable compared to last being seen. Some days are better than other. The pain is exacerbated by bowel movements and being active.   They have not received the recommendation from Dr. Salomon Fick at this time.   Otherwise he denies any changes in his health.  Denies any fever or chills. He has some night sweats which just started. He denies signs of infection or sick contacts. Denies URI, cough, skin infections, abdominal pain, or dysuria. Denies any unexplained weight loss. His anemia is getting a little worse. He was previously on iron but is no longer on that due to concerns it will cause constipation. He denies any visible bleeding. He has not had any rectal bleeding in about 2 weeks.  His breathing is stable/slightly more labored with rest but ok with exertion. He states after chemo he had a weird sensation in his tumor but denies pain. Denies any cough or chest pain.  Denies any headaches.  Denies any nausea, vomiting, or diarrhea.  He is regulating his bowel movements with Colace and MiraLAX.  He is here today for evaluation reviewed blood work before undergoing day 8 cycle  1.      MEDICAL HISTORY: Past Medical History:  Diagnosis Date   Anemia    Clubbing of nails    Deafness in left ear    Encounter for antineoplastic chemotherapy    History of kidney stones    Hypertension    Hypoglycemia due to neoplasm    Malignant neoplasm prostate [facility]) 07/2022   urologist--- dr pace;   bx 01/ 2024, Gleason 4+3   Malignant solitary fibrous neoplasm (HCC) 11/2015   oncologist--- @ [facility] Dr R. 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  MEDICATIONS:  Current Outpatient Medications  Medication Sig Dispense Refill   dexamethasone (DECADRON) 4 MG tablet Take 2 tabs by mouth 2 times daily starting day before chemo. Then take 2 tabs daily for 2 days starting day after chemo. Take with food. 30 tablet 1   docusate sodium (COLACE) 100 MG capsule Take 1 capsule (100 mg total) by mouth every 12 (twelve) hours. (Patient taking differently: Take 100 mg by mouth daily.) 60 capsule 0   folic acid (FOLVITE) 400 MCG tablet Take 400 mcg by mouth daily.     gabapentin (NEURONTIN) 100 MG capsule Take 300 mg by mouth 2 (two) times daily. (Patient not taking: Reported on 03/26/2023)     gabapentin (NEURONTIN) 600 MG tablet Take 600 mg by mouth 2 (two) times daily.     Multiple Vitamin (MULTIVITAMIN) capsule Take 1 capsule by mouth daily.     ondansetron (ZOFRAN) 8 MG tablet Take 1 tablet (8 mg total) by mouth every 8 (eight) hours as needed for nausea or vomiting. 30 tablet 1   oxyCODONE (ROXICODONE) 15 MG immediate release tablet Take 1 tablet (15 mg total) by mouth every 6 (six) hours as needed for pain. 60 tablet 0   polyethylene glycol (MIRALAX / GLYCOLAX) 17 g packet Take 17 g by mouth 2 (two) times daily.     pregabalin (LYRICA) 100 MG capsule Take 100 mg by mouth 2 (two) times daily.     tamsulosin (FLOMAX) 0.4 MG CAPS capsule Take 0.4 mg by mouth at bedtime. Urology started 8/27     No current facility-administered medications for this visit.    Facility-Administered Medications Ordered in Other Visits  Medication Dose Route Frequency Provider Last Rate Last Admin   sodium phosphate (FLEET) 7-19 GM/118ML enema 1 enema  1 enema Rectal Once Noel Christmas, MD       sodium phosphate (FLEET) 7-19 GM/118ML enema 1 enema  1 enema Rectal Once Noel Christmas, MD        SURGICAL HISTORY:  Past Surgical History:  Procedure Laterality Date   GOLD SEED IMPLANT N/A 10/21/2022   Procedure: GOLD SEED IMPLANT;  Surgeon: Noel Christmas, MD;  Location: [facility];  Service: Urology;  Laterality: N/A;   INTERCOSTAL NERVE BLOCK Right 04/27/2020   Procedure: INTERCOSTAL NERVE BLOCK;  Surgeon: Loreli Slot,  MD;  Location: [facility] OR;  Service: Thoracic;  Laterality: Right;   KNEE CARTILAGE SURGERY Left 1976   RESECTION OF MEDIASTINAL MASS Right 01/23/2016   @[facility]  by dr Dorris Fetch;   resection chest wall mass w/ en bloc wedge resection of RLL   RESECTION OF MEDIASTINAL MASS Right 11/13/2021   Procedure: RESECTION OF PLEURAL TUMORS;  Surgeon: Loreli Slot, MD;  Location: [facility] OR;  Service: Thoracic;  Laterality: Right;   SPACE OAR INSTILLATION N/A 10/21/2022   Procedure: SPACE OAR INSTILLATION;  Surgeon: Noel Christmas, MD;  Location: [facility];  Service: Urology;  Laterality: N/A;   THORACOTOMY Right 04/27/2020   Procedure: THORACOTOMY;  Surgeon: Loreli Slot, MD;  Location: [facility] OR;  Service: Thoracic;  Laterality: Right;   THORACOTOMY Right 11/13/2021   Procedure: REDO THORACOTOMY;  Surgeon: Loreli Slot, MD;  Location: [facility] OR;  Service: Thoracic;  Laterality: Right;   TONSILLECTOMY     child   VIDEO BRONCHOSCOPY WITH ENDOBRONCHIAL ULTRASOUND N/A 12/17/2015   Procedure: VIDEO BRONCHOSCOPY WITH ENDOBRONCHIAL ULTRASOUND;  Surgeon: Loreli Slot, MD;  Location: [facility] OR;  Service: Thoracic;  Laterality: N/A;    REVIEW OF SYSTEMS:   Constitutional: Positive for fatigue. Negative  for appetite change, chills, fever and unexpected weight change.  HENT: Negative for mouth sores, nosebleeds, sore throat and trouble swallowing.   Eyes: Negative for eye problems and icterus.  Respiratory: Mild occasional dyspnea. Negative for cough, hemoptysis, and wheezing.   Cardiovascular: Negative for chest pain and leg swelling.  Gastrointestinal: Positive for rectal pain. Negative for abdominal pain,  diarrhea, nausea and vomiting.  Genitourinary: Negative for bladder incontinence, difficulty urinating, dysuria, frequency and hematuria.   Musculoskeletal: Negative for back pain, gait problem, neck pain and neck stiffness.  Skin: Negative for itching and rash.  Neurological: Negative for dizziness, extremity weakness, gait problem, headaches, light-headedness and seizures.  Hematological: Negative for adenopathy. Does not bruise/bleed easily.  Psychiatric/Behavioral: Negative for confusion, depression and sleep disturbance. The patient is not nervous/anxious.        PHYSICAL EXAMINATION:  Blood pressure 123/82, pulse 88, temperature 98.3 F (36.8 C), temperature source Oral, resp. rate 16, weight 160 lb 11.2 oz (72.9 kg), SpO2 100%.  ECOG PERFORMANCE STATUS: 1  Physical Exam  Constitutional: Oriented to person, place, and time and well-developed, well-nourished, and in no distress.  HENT:  Head: Normocephalic and atraumatic.  Mouth/Throat: Oropharynx is clear and moist. No oropharyngeal exudate.  Eyes: Conjunctivae are normal. Right eye exhibits no discharge. Left eye exhibits no discharge. No scleral icterus.  Neck: Normal range of motion. Neck supple.  Cardiovascular: Normal rate, regular rhythm, normal heart sounds and intact distal pulses.   Pulmonary/Chest: Effort normal.  Quiet breath sounds in the right lung.  No respiratory distress. No wheezes. No rales.  Abdominal: Soft. Bowel sounds are normal. Exhibits no distension and no mass. There is no tenderness.   Musculoskeletal: Normal range of motion. Exhibits no edema.  Lymphadenopathy:    No cervical adenopathy.  Neurological: Alert and oriented to person, place, and time. Exhibits normal muscle tone. Gait normal. Coordination normal.  Skin: Skin is warm and dry. No rash noted. Not diaphoretic. No erythema. No pallor.  Psychiatric: Mood, memory and judgment normal.  Vitals reviewed.  LABORATORY DATA: Lab Results  Component Value Date   WBC 6.5 04/21/2023   HGB 8.9 (L) 04/21/2023   HCT 28.2 (L) 04/21/2023   MCV 91.9 04/21/2023   PLT 221 04/21/2023  Chemistry      Component Value Date/Time   NA 135 04/14/2023 0754   K 3.9 04/14/2023 0754   CL 99 04/14/2023 0754   CO2 31 04/14/2023 0754   BUN 15 04/14/2023 0754   CREATININE 1.08 04/14/2023 0754      Component Value Date/Time   CALCIUM 9.4 04/14/2023 0754   ALKPHOS 71 04/14/2023 0754   AST 14 (L) 04/14/2023 0754   ALT 9 04/14/2023 0754   BILITOT 0.6 04/14/2023 0754       RADIOGRAPHIC STUDIES:  No results found.   ASSESSMENT/PLAN:  This is a very pleasant 62 year old male with  1) recurrent solitary fibrous tumor of the right lung.  This was initially diagnosed in June 2017.  2) prostate adenocarcinoma with Gleason score of 7 (4+3) diagnosed in February 2024 followed by urology.   He is status post resection.  He had recurrence in October 2021 and underwent re-resection under the care of Dr. Dorris Fetch.  He had been on observation as there is no role for adjuvant therapy for his condition.  He was seen by Dr. Lin Givens at [facility] who recommended observation and consideration of treatment with Sunitinib followed by surgery if the patient had disease recurrence.    The patient had a new 1.4 cm nodule posteriorly and the patient underwent treatment with sunitinib 37.5 mg p.o. daily.  He was on this for 3 months. He had been tolerating this fairly well except for hypertension.  Unfortunately scan showed evidence  of disease progression with interval development of multiple new pleural-based nodules in the right hemithorax measuring up to 2.9 cm which is concerning for recurrent/metastatic disease.    He was then seen by Dr. Dorris Fetch who offered surgical resection.  Dr. Lin Givens recommended neoadjuvant treatment with pazopanib for 8 weeks followed by surgical resection if he had improvement in his disease followed by continued adjuvant treatment for another 6 to 8 months.  Unfortunately, after 2 months of treatment the CT scan showed clear evidence of disease progression with enlarging right pleural-based masses.  He was referred to Dr. 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  Molecular studies by foundation 1 and PD-L1 did not show any actionable mutations and his PD-L1 expression was negative.    The patient then saw Dr. Waymon Amato at [facility] who recommended inpatient systemic chemotherapy with doxorubicin, ifosfamide, and mesna IV every 3 weeks.  He had a 2D echo as well as a Port-A-Cath placed.  This was discontinued due to evidence of disease progression.    Dr. Waymon Amato recommended for the patient treatment with Temodar 200 Mg/M2 on days 1-7 and 15-21 in addition to Avastin 5 Mg/M2 on days 8 and 22 every 4 weeks.  He is status post 11 cycles.    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 His avastin can cause delayed wound healing. Additionally, he was found to have disease progression so treatment was discontinued.    He saw Dr. Cherylann Ratel last week who recommended gemcitabine 900 mg/m IV on days 1 and 8 and docetaxel 75 mg/m on day 8 with Neulasta support on day 9. For outpatient premedications, he recommenced dexamethasone 8 mg p.o. twice a day the day before  and the day after day 8 of chemotherapy.   The patient started this  last week and tolerated it well except for fatigue and worsening anemia. Labs were reviewed. Recommend he proceed with treatment today as scheduled. I will add sample to blood bank with his future lab visits and we would consider him for a blood transfusion if his hemoglobin were less than 8.   The patient understands that that chemotherapy can cause delayed wound healing.  Dr. Cherylann Ratel recommends repeat imaging every 3 cycles.    We will see him back in 2 weeks for evaluation and repeat blood work with day 1 cycle 2.   He will use claritin for 4-7 days after the neulasta injection.    He will let us know when he gets in touch with Dr. Salomon Fick if they have any different recommendations.  He talked about possibly resuming an iron supplement.  He is going to try this.  If it exacerbates his constipation then he will discontinue it.  Addendum: Dr. Arbutus Ped spoke with Dr. Salomon Fick from [facility].  Dr. Salomon Fick is in agreement with the patient's current regimen, but of course, the concern for infection is there with his fistula.  They do not have any phase 1 clinical trials at this time.  If he had progression in the future she would recommend possibly dacarbazine or Doxil.    The patient was advised to call immediately if she has any concerning symptoms in the interval. The patient voices understanding of current disease status and treatment options and is in agreement with the current care plan. All questions were answered. The patient knows to call the clinic with any problems, questions or concerns. We can certainly see the patient much sooner if necessary    Orders Placed This Encounter  Procedures   Sample to Blood Bank    Standing Status:   Standing    Number of Occurrences:   10    Standing Expiration Date:   04/20/2024      The total time spent in the appointment was 20-29 minutes  Qianna Clagett L Wrigley Plasencia, PA-C 04/21/23

## 2023-04-21 ENCOUNTER — Other Ambulatory Visit: Payer: Self-pay

## 2023-04-21 ENCOUNTER — Inpatient Hospital Stay: Payer: BC Managed Care – PPO | Attending: Internal Medicine

## 2023-04-21 ENCOUNTER — Inpatient Hospital Stay: Payer: BC Managed Care – PPO | Attending: Internal Medicine | Admitting: Physician Assistant

## 2023-04-21 ENCOUNTER — Inpatient Hospital Stay: Payer: BC Managed Care – PPO

## 2023-04-21 VITALS — BP 129/73 | HR 79 | Temp 98.1°F | Resp 16

## 2023-04-21 VITALS — BP 123/82 | HR 88 | Temp 98.3°F | Resp 16 | Wt 160.7 lb

## 2023-04-21 DIAGNOSIS — E559 Vitamin D deficiency, unspecified: Secondary | ICD-10-CM | POA: Insufficient documentation

## 2023-04-21 DIAGNOSIS — D696 Thrombocytopenia, unspecified: Secondary | ICD-10-CM | POA: Insufficient documentation

## 2023-04-21 DIAGNOSIS — R918 Other nonspecific abnormal finding of lung field: Secondary | ICD-10-CM

## 2023-04-21 DIAGNOSIS — C61 Malignant neoplasm of prostate: Secondary | ICD-10-CM | POA: Diagnosis present

## 2023-04-21 DIAGNOSIS — Z5111 Encounter for antineoplastic chemotherapy: Secondary | ICD-10-CM | POA: Insufficient documentation

## 2023-04-21 DIAGNOSIS — Z87442 Personal history of urinary calculi: Secondary | ICD-10-CM | POA: Diagnosis not present

## 2023-04-21 DIAGNOSIS — Z95828 Presence of other vascular implants and grafts: Secondary | ICD-10-CM

## 2023-04-21 DIAGNOSIS — Z7952 Long term (current) use of systemic steroids: Secondary | ICD-10-CM | POA: Diagnosis not present

## 2023-04-21 DIAGNOSIS — Z5189 Encounter for other specified aftercare: Secondary | ICD-10-CM | POA: Insufficient documentation

## 2023-04-21 DIAGNOSIS — D492 Neoplasm of unspecified behavior of bone, soft tissue, and skin: Secondary | ICD-10-CM

## 2023-04-21 DIAGNOSIS — D649 Anemia, unspecified: Secondary | ICD-10-CM | POA: Diagnosis not present

## 2023-04-21 DIAGNOSIS — R5383 Other fatigue: Secondary | ICD-10-CM | POA: Diagnosis not present

## 2023-04-21 DIAGNOSIS — Z9221 Personal history of antineoplastic chemotherapy: Secondary | ICD-10-CM | POA: Diagnosis not present

## 2023-04-21 DIAGNOSIS — R0602 Shortness of breath: Secondary | ICD-10-CM | POA: Insufficient documentation

## 2023-04-21 DIAGNOSIS — D6481 Anemia due to antineoplastic chemotherapy: Secondary | ICD-10-CM

## 2023-04-21 DIAGNOSIS — T451X5A Adverse effect of antineoplastic and immunosuppressive drugs, initial encounter: Secondary | ICD-10-CM | POA: Diagnosis not present

## 2023-04-21 DIAGNOSIS — I10 Essential (primary) hypertension: Secondary | ICD-10-CM | POA: Diagnosis not present

## 2023-04-21 DIAGNOSIS — E8809 Other disorders of plasma-protein metabolism, not elsewhere classified: Secondary | ICD-10-CM | POA: Insufficient documentation

## 2023-04-21 DIAGNOSIS — K5900 Constipation, unspecified: Secondary | ICD-10-CM | POA: Diagnosis not present

## 2023-04-21 DIAGNOSIS — Z79899 Other long term (current) drug therapy: Secondary | ICD-10-CM | POA: Insufficient documentation

## 2023-04-21 DIAGNOSIS — M1990 Unspecified osteoarthritis, unspecified site: Secondary | ICD-10-CM | POA: Diagnosis not present

## 2023-04-21 DIAGNOSIS — R61 Generalized hyperhidrosis: Secondary | ICD-10-CM | POA: Insufficient documentation

## 2023-04-21 DIAGNOSIS — K605 Anorectal fistula, unspecified: Secondary | ICD-10-CM | POA: Diagnosis not present

## 2023-04-21 DIAGNOSIS — Z7689 Persons encountering health services in other specified circumstances: Secondary | ICD-10-CM

## 2023-04-21 DIAGNOSIS — Z923 Personal history of irradiation: Secondary | ICD-10-CM | POA: Insufficient documentation

## 2023-04-21 LAB — CBC WITH DIFFERENTIAL (CANCER CENTER ONLY)
Abs Immature Granulocytes: 0.02 10*3/uL (ref 0.00–0.07)
Basophils Absolute: 0 10*3/uL (ref 0.0–0.1)
Basophils Relative: 0 %
Eosinophils Absolute: 0 10*3/uL (ref 0.0–0.5)
Eosinophils Relative: 0 %
HCT: 28.2 % — ABNORMAL LOW (ref 39.0–52.0)
Hemoglobin: 8.9 g/dL — ABNORMAL LOW (ref 13.0–17.0)
Immature Granulocytes: 0 %
Lymphocytes Relative: 7 %
Lymphs Abs: 0.5 10*3/uL — ABNORMAL LOW (ref 0.7–4.0)
MCH: 29 pg (ref 26.0–34.0)
MCHC: 31.6 g/dL (ref 30.0–36.0)
MCV: 91.9 fL (ref 80.0–100.0)
Monocytes Absolute: 0.6 10*3/uL (ref 0.1–1.0)
Monocytes Relative: 9 %
Neutro Abs: 5.5 10*3/uL (ref 1.7–7.7)
Neutrophils Relative %: 84 %
Platelet Count: 221 10*3/uL (ref 150–400)
RBC: 3.07 MIL/uL — ABNORMAL LOW (ref 4.22–5.81)
RDW: 14.4 % (ref 11.5–15.5)
WBC Count: 6.5 10*3/uL (ref 4.0–10.5)
nRBC: 0 % (ref 0.0–0.2)

## 2023-04-21 LAB — CMP (CANCER CENTER ONLY)
ALT: 6 U/L (ref 0–44)
AST: 10 U/L — ABNORMAL LOW (ref 15–41)
Albumin: 2.9 g/dL — ABNORMAL LOW (ref 3.5–5.0)
Alkaline Phosphatase: 79 U/L (ref 38–126)
Anion gap: 6 (ref 5–15)
BUN: 16 mg/dL (ref 8–23)
CO2: 31 mmol/L (ref 22–32)
Calcium: 9.4 mg/dL (ref 8.9–10.3)
Chloride: 99 mmol/L (ref 98–111)
Creatinine: 0.83 mg/dL (ref 0.61–1.24)
GFR, Estimated: >60 mL/min
Glucose, Bld: 105 mg/dL — ABNORMAL HIGH (ref 70–99)
Potassium: 3.9 mmol/L (ref 3.5–5.1)
Sodium: 136 mmol/L (ref 135–145)
Total Bilirubin: 0.3 mg/dL
Total Protein: 6.6 g/dL (ref 6.5–8.1)

## 2023-04-21 MED ORDER — SODIUM CHLORIDE 0.9 % IV SOLN
75.0000 mg/m2 | Freq: Once | INTRAVENOUS | Status: AC
  Administered 2023-04-21: 143 mg via INTRAVENOUS
  Filled 2023-04-21: qty 14.3

## 2023-04-21 MED ORDER — SODIUM CHLORIDE 0.9 % IV SOLN: INTRAVENOUS | Status: DC

## 2023-04-21 MED ORDER — SODIUM CHLORIDE 0.9% FLUSH
10.0000 mL | INTRAVENOUS | Status: DC | PRN
Start: 2023-04-21 — End: 2023-04-21
  Administered 2023-04-21: 10 mL

## 2023-04-21 MED ORDER — HEPARIN SOD (PORK) LOCK FLUSH 100 UNIT/ML IV SOLN
500.0000 [IU] | Freq: Once | INTRAVENOUS | Status: AC | PRN
  Administered 2023-04-21: 500 [IU]

## 2023-04-21 MED ORDER — SODIUM CHLORIDE 0.9% FLUSH
10.0000 mL | Freq: Once | INTRAVENOUS | Status: AC
  Administered 2023-04-21: 10 mL

## 2023-04-21 MED ORDER — SODIUM CHLORIDE 0.9 % IV SOLN
900.0000 mg/m2 | Freq: Once | INTRAVENOUS | Status: AC
  Administered 2023-04-21: 1711 mg via INTRAVENOUS
  Filled 2023-04-21: qty 45

## 2023-04-21 MED ORDER — PEGFILGRASTIM-CBQV 6 MG/0.6ML ~~LOC~~ SOSY
6.0000 mg | PREFILLED_SYRINGE | Freq: Once | SUBCUTANEOUS | Status: DC
Start: 2023-04-21 — End: 2023-04-21

## 2023-04-21 MED ORDER — DEXAMETHASONE SODIUM PHOSPHATE 10 MG/ML IJ SOLN
10.0000 mg | Freq: Once | INTRAMUSCULAR | Status: AC
  Administered 2023-04-21: 10 mg via INTRAVENOUS
  Filled 2023-04-21: qty 1

## 2023-04-21 NOTE — Patient Instructions (Signed)
[facility] - A DEPT OF [facility]  Discharge Instructions: Thank you for choosing [facility] to provide your oncology and hematology care.   If you have a lab appointment with the Cancer Center, please go directly to the Cancer Center and check in at the registration area.   Wear comfortable clothing and clothing appropriate for easy access to any Portacath or PICC line.   We strive to give you quality time with your provider. You may need to reschedule your appointment if you arrive late (15 or more minutes).  Arriving late affects you and other patients whose appointments are after yours.  Also, if you miss three or more appointments without notifying the office, you may be dismissed from the clinic at the provider's discretion.      For prescription refill requests, have your pharmacy contact our office and allow 72 hours for refills to be completed.    Today you received the following chemotherapy and/or immunotherapy agents: Gemzar, Docetaxel      To help prevent nausea and vomiting after your treatment, we encourage you to take your nausea medication as directed.  BELOW ARE SYMPTOMS THAT SHOULD BE REPORTED IMMEDIATELY: *FEVER GREATER THAN 100.4 F (38 C) OR HIGHER *CHILLS OR SWEATING *NAUSEA AND VOMITING THAT IS NOT CONTROLLED WITH YOUR NAUSEA MEDICATION *UNUSUAL SHORTNESS OF BREATH *UNUSUAL BRUISING OR BLEEDING *URINARY PROBLEMS (pain or burning when urinating, or frequent urination) *BOWEL PROBLEMS (unusual diarrhea, constipation, pain near the anus) TENDERNESS IN MOUTH AND THROAT WITH OR WITHOUT PRESENCE OF ULCERS (sore throat, sores in mouth, or a toothache) UNUSUAL RASH, SWELLING OR PAIN  UNUSUAL VAGINAL DISCHARGE OR ITCHING   Items with * indicate a potential emergency and should be followed up as soon as possible or go to the Emergency Department if any problems should occur.  Please show the CHEMOTHERAPY ALERT CARD or  IMMUNOTHERAPY ALERT CARD at check-in to the Emergency Department and triage nurse.  Should you have questions after your visit or need to cancel or reschedule your appointment, please contact [facility] - A DEPT OF [facility]  Dept: [phone]  and follow the prompts.  Office hours are 8:00 a.m. to 4:30 p.m. Monday - Friday. Please note that voicemails left after 4:00 p.m. may not be returned until the following business day.  We are closed weekends and major holidays. You have access to a nurse at all times for urgent questions. Please call the main number to the clinic Dept: [phone] and follow the prompts.   For any non-urgent questions, you may also contact your provider using MyChart. We now offer e-Visits for anyone 52 and older to request care online for non-urgent symptoms. For details visit [email or web address].   Also download the MyChart app! Go to the app store, search "MyChart", open the app, select [facility], and log in with your MyChart username and password.  Gemcitabine Injection What is this medication? GEMCITABINE (jem SYE ta been) treats some types of cancer. It works by slowing down the growth of cancer cells. This medicine may be used for other purposes; ask your health care provider or pharmacist if you have questions. COMMON BRAND NAME(S): Gemzar, Infugem What should I tell my care team before I take this medication? They need to know if you have any of these conditions: Blood disorders Infection Kidney disease Liver disease Lung or breathing disease, such as asthma or COPD Recent or ongoing radiation therapy An  unusual or allergic reaction to gemcitabine, other medications, foods, dyes, or preservatives If you or your partner are pregnant or trying to get pregnant Breast-feeding How should I use this medication? This medication is injected into a vein. It is given by your care team in a hospital or clinic setting. Talk  to your care team about the use of this medication in children. Special care may be needed. Overdosage: If you think you have taken too much of this medicine contact a poison control center or emergency room at once. NOTE: This medicine is only for you. Do not share this medicine with others. What if I miss a dose? Keep appointments for follow-up doses. It is important not to miss your dose. Call your care team if you are unable to keep an appointment. What may interact with this medication? Interactions have not been studied. This list may not describe all possible interactions. Give your health care provider a list of all the medicines, herbs, non-prescription drugs, or dietary supplements you use. Also tell them if you smoke, drink alcohol, or use illegal drugs. Some items may interact with your medicine. What should I watch for while using this medication? Your condition will be monitored carefully while you are receiving this medication. This medication may make you feel generally unwell. This is not uncommon, as chemotherapy can affect healthy cells as well as cancer cells. Report any side effects. Continue your course of treatment even though you feel ill unless your care team tells you to stop. In some cases, you may be given additional medications to help with side effects. Follow all directions for their use. This medication may increase your risk of getting an infection. Call your care team for advice if you get a fever, chills, sore throat, or other symptoms of a cold or flu. Do not treat yourself. Try to avoid being around people who are sick. This medication may increase your risk to bruise or bleed. Call your care team if you notice any unusual bleeding. Be careful brushing or flossing your teeth or using a toothpick because you may get an infection or bleed more easily. If you have any dental work done, tell your dentist you are receiving this medication. Avoid taking medications that  contain aspirin, acetaminophen, ibuprofen, naproxen, or ketoprofen unless instructed by your care team. These medications may hide a fever. Talk to your care team if you or your partner wish to become pregnant or think you might be pregnant. This medication can cause serious birth defects if taken during pregnancy and for 6 months after the last dose. A negative pregnancy test is required before starting this medication. A reliable form of contraception is recommended while taking this medication and for 6 months after the last dose. Talk to your care team about effective forms of contraception. Do not father a child while taking this medication and for 3 months after the last dose. Use a condom while having sex during this time period. Do not breastfeed while taking this medication and for at least 1 week after the last dose. This medication may cause infertility. Talk to your care team if you are concerned about your fertility. What side effects may I notice from receiving this medication? Side effects that you should report to your care team as soon as possible: Allergic reactions--skin rash, itching, hives, swelling of the face, lips, tongue, or throat Capillary leak syndrome--stomach or muscle pain, unusual weakness or fatigue, feeling faint or lightheaded, decrease in the amount of  urine, swelling of the ankles, hands, or feet, trouble breathing Infection--fever, chills, cough, sore throat, wounds that don't heal, pain or trouble when passing urine, general feeling of discomfort or being unwell Liver injury--right upper belly pain, loss of appetite, nausea, light-colored stool, dark yellow or brown urine, yellowing skin or eyes, unusual weakness or fatigue Low red blood cell level--unusual weakness or fatigue, dizziness, headache, trouble breathing Lung injury--shortness of breath or trouble breathing, cough, spitting up blood, chest pain, fever Stomach pain, bloody diarrhea, pale skin, unusual  weakness or fatigue, decrease in the amount of urine, which may be signs of hemolytic uremic syndrome Sudden and severe headache, confusion, change in vision, seizures, which may be signs of posterior reversible encephalopathy syndrome (PRES) Unusual bruising or bleeding Side effects that usually do not require medical attention (report to your care team if they continue or are bothersome): Diarrhea Drowsiness Hair loss Nausea Pain, redness, or swelling with sores inside the mouth or throat Vomiting This list may not describe all possible side effects. Call your doctor for medical advice about side effects. You may report side effects to FDA at 1-800-FDA-1088. Where should I keep my medication? This medication is given in a hospital or clinic. It will not be stored at home. NOTE: This sheet is a summary. It may not cover all possible information. If you have questions about this medicine, talk to your doctor, pharmacist, or health care provider.  2024 Elsevier/Gold Standard (2021-10-08 00:00:00)  Docetaxel Injection What is this medication? DOCETAXEL (doe se TAX el) treats some types of cancer. It works by slowing down the growth of cancer cells. This medicine may be used for other purposes; ask your health care provider or pharmacist if you have questions. COMMON BRAND NAME(S): Docefrez, Docivyx, Taxotere What should I tell my care team before I take this medication? They need to know if you have any of these conditions: Kidney disease Liver disease Low white blood cell levels Tingling of the fingers or toes or other nerve disorder An unusual or allergic reaction to docetaxel, polysorbate 80, other medications, foods, dyes, or preservatives Pregnant or trying to get pregnant Breast-feeding How should I use this medication? This medication is injected into a vein. It is given by your care team in a hospital or clinic setting. Talk to your care team about the use of this medication in  children. Special care may be needed. Overdosage: If you think you have taken too much of this medicine contact a poison control center or emergency room at once. NOTE: This medicine is only for you. Do not share this medicine with others. What if I miss a dose? Keep appointments for follow-up doses. It is important not to miss your dose. Call your care team if you are unable to keep an appointment. What may interact with this medication? Do not take this medication with any of the following: Live virus vaccines This medication may also interact with the following: Certain antibiotics, such as clarithromycin, telithromycin Certain antivirals for HIV or hepatitis Certain medications for fungal infections, such as itraconazole, ketoconazole, voriconazole Grapefruit juice Nefazodone Supplements, such as St. John's wort This list may not describe all possible interactions. Give your health care provider a list of all the medicines, herbs, non-prescription drugs, or dietary supplements you use. Also tell them if you smoke, drink alcohol, or use illegal drugs. Some items may interact with your medicine. What should I watch for while using this medication? This medication may make you feel generally unwell.  This is not uncommon as chemotherapy can affect healthy cells as well as cancer cells. Report any side effects. Continue your course of treatment even though you feel ill unless your care team tells you to stop. You may need blood work done while you are taking this medication. This medication can cause serious side effects and infusion reactions. To reduce the risk, your care team may give you other medications to take before receiving this one. Be sure to follow the directions from your care team. This medication may increase your risk of getting an infection. Call your care team for advice if you get a fever, chills, sore throat, or other symptoms of a cold or flu. Do not treat yourself. Try to  avoid being around people who are sick. Avoid taking medications that contain aspirin, acetaminophen, ibuprofen, naproxen, or ketoprofen unless instructed by your care team. These medications may hide a fever. Be careful brushing or flossing your teeth or using a toothpick because you may get an infection or bleed more easily. If you have any dental work done, tell your dentist you are receiving this medication. Some products may contain alcohol. Ask your care team if this medication contains alcohol. Be sure to tell all care teams you are taking this medicine. Certain medications, like metronidazole and disulfiram, can cause an unpleasant reaction when taken with alcohol. The reaction includes flushing, headache, nausea, vomiting, sweating, and increased thirst. The reaction can last from 30 minutes to several hours. This medication may affect your coordination, reaction time, or judgement. Do not drive or operate machinery until you know how this medication affects you. Sit up or stand slowly to reduce the risk of dizzy or fainting spells. Drinking alcohol with this medication can increase the risk of these side effects. Talk to your care team about your risk of cancer. You may be more at risk for certain types of cancer if you take this medication. Talk to your care team if you wish to become pregnant or think you might be pregnant. This medication can cause serious birth defects if taken during pregnancy or if you get pregnant within 2 months after stopping therapy. A negative pregnancy test is required before starting this medication. A reliable form of contraception is recommended while taking this medication and for 2 months after stopping it. Talk to your care team about reliable forms of contraception. Do not breast-feed while taking this medication and for 1 week after stopping therapy. Use a condom during sex and for 4 months after stopping therapy. Tell your care team right away if you think your  partner might be pregnant. This medication can cause serious birth defects. This medication may cause infertility. Talk to your care team if you are concerned about your fertility. What side effects may I notice from receiving this medication? Side effects that you should report to your care team as soon as possible: Allergic reactions--skin rash, itching, hives, swelling of the face, lips, tongue, or throat Change in vision such as blurry vision, seeing halos around lights, vision loss Infection--fever, chills, cough, or sore throat Infusion reactions--chest pain, shortness of breath or trouble breathing, feeling faint or lightheaded Low red blood cell level--unusual weakness or fatigue, dizziness, headache, trouble breathing Pain, tingling, or numbness in the hands or feet Painful swelling, warmth, or redness of the skin, blisters or sores at the infusion site Redness, blistering, peeling, or loosening of the skin, including inside the mouth Sudden or severe stomach pain, bloody diarrhea, fever, nausea, vomiting  Swelling of the ankles, hands, or feet Tumor lysis syndrome (TLS)--nausea, vomiting, diarrhea, decrease in the amount of urine, dark urine, unusual weakness or fatigue, confusion, muscle pain or cramps, fast or irregular heartbeat, joint pain Unusual bruising or bleeding Side effects that usually do not require medical attention (report to your care team if they continue or are bothersome): Change in nail shape, thickness, or color Change in taste Hair loss Increased tears This list may not describe all possible side effects. Call your doctor for medical advice about side effects. You may report side effects to FDA at 1-800-FDA-1088. Where should I keep my medication? This medication is given in a hospital or clinic. It will not be stored at home. NOTE: This sheet is a summary. It may not cover all possible information. If you have questions about this medicine, talk to your doctor,  pharmacist, or health care provider.  2024 Elsevier/Gold Standard (2021-08-08 00:00:00)

## 2023-04-22 ENCOUNTER — Encounter: Payer: Self-pay | Admitting: Physician Assistant

## 2023-04-22 ENCOUNTER — Telehealth: Payer: Self-pay

## 2023-04-22 NOTE — Telephone Encounter (Signed)
Ms. Lisbon states that Anh is doing fine. He is eating, drinking, and urinating well. They know to call the office at [phone] if they have any questions or concerns.

## 2023-04-22 NOTE — Telephone Encounter (Signed)
-----   Message from Nurse Lanora Manis A sent at 04/21/2023  3:54 PM EST ----- Regarding: First timer 04/21/23- First time Docetaxel (with Gemzar-second dose) -tolerated well. Dr. Arbutus Ped

## 2023-04-23 ENCOUNTER — Inpatient Hospital Stay: Payer: BC Managed Care – PPO

## 2023-04-23 ENCOUNTER — Other Ambulatory Visit: Payer: BC Managed Care – PPO

## 2023-04-23 ENCOUNTER — Ambulatory Visit: Payer: BC Managed Care – PPO | Admitting: Internal Medicine

## 2023-04-23 ENCOUNTER — Ambulatory Visit: Payer: BC Managed Care – PPO

## 2023-04-23 DIAGNOSIS — C61 Malignant neoplasm of prostate: Secondary | ICD-10-CM | POA: Diagnosis not present

## 2023-04-23 DIAGNOSIS — D492 Neoplasm of unspecified behavior of bone, soft tissue, and skin: Secondary | ICD-10-CM

## 2023-04-23 MED ORDER — PEGFILGRASTIM-JMDB 6 MG/0.6ML ~~LOC~~ SOSY
6.0000 mg | PREFILLED_SYRINGE | Freq: Once | SUBCUTANEOUS | Status: AC
Start: 2023-04-23 — End: 2023-04-23
  Administered 2023-04-23: 6 mg via SUBCUTANEOUS

## 2023-04-23 NOTE — Patient Instructions (Signed)

## 2023-04-28 ENCOUNTER — Inpatient Hospital Stay: Payer: BC Managed Care – PPO

## 2023-04-28 DIAGNOSIS — D6481 Anemia due to antineoplastic chemotherapy: Secondary | ICD-10-CM

## 2023-04-28 DIAGNOSIS — D492 Neoplasm of unspecified behavior of bone, soft tissue, and skin: Secondary | ICD-10-CM

## 2023-04-28 DIAGNOSIS — Z95828 Presence of other vascular implants and grafts: Secondary | ICD-10-CM

## 2023-04-28 DIAGNOSIS — C61 Malignant neoplasm of prostate: Secondary | ICD-10-CM | POA: Diagnosis not present

## 2023-04-28 LAB — CBC WITH DIFFERENTIAL (CANCER CENTER ONLY)
Abs Immature Granulocytes: 0.27 10*3/uL — ABNORMAL HIGH (ref 0.00–0.07)
Basophils Absolute: 0.1 10*3/uL (ref 0.0–0.1)
Basophils Relative: 1 %
Eosinophils Absolute: 0 10*3/uL (ref 0.0–0.5)
Eosinophils Relative: 0 %
HCT: 26.7 % — ABNORMAL LOW (ref 39.0–52.0)
Hemoglobin: 8.3 g/dL — ABNORMAL LOW (ref 13.0–17.0)
Immature Granulocytes: 3 %
Lymphocytes Relative: 4 %
Lymphs Abs: 0.4 10*3/uL — ABNORMAL LOW (ref 0.7–4.0)
MCH: 28.6 pg (ref 26.0–34.0)
MCHC: 31.1 g/dL (ref 30.0–36.0)
MCV: 92.1 fL (ref 80.0–100.0)
Monocytes Absolute: 0.9 10*3/uL (ref 0.1–1.0)
Monocytes Relative: 8 %
Neutro Abs: 9.3 10*3/uL — ABNORMAL HIGH (ref 1.7–7.7)
Neutrophils Relative %: 84 %
Platelet Count: 49 10*3/uL — ABNORMAL LOW (ref 150–400)
RBC: 2.9 MIL/uL — ABNORMAL LOW (ref 4.22–5.81)
RDW: 15.3 % (ref 11.5–15.5)
Smear Review: DECREASED
WBC Count: 10.9 10*3/uL — ABNORMAL HIGH (ref 4.0–10.5)
nRBC: 0.8 % — ABNORMAL HIGH (ref 0.0–0.2)

## 2023-04-28 LAB — CMP (CANCER CENTER ONLY)
ALT: 8 U/L (ref 0–44)
AST: 12 U/L — ABNORMAL LOW (ref 15–41)
Albumin: 2.8 g/dL — ABNORMAL LOW (ref 3.5–5.0)
Alkaline Phosphatase: 111 U/L (ref 38–126)
Anion gap: 7 (ref 5–15)
BUN: 17 mg/dL (ref 8–23)
CO2: 30 mmol/L (ref 22–32)
Calcium: 8.9 mg/dL (ref 8.9–10.3)
Chloride: 101 mmol/L (ref 98–111)
Creatinine: 0.99 mg/dL (ref 0.61–1.24)
GFR, Estimated: >60 mL/min (ref >60)
Glucose, Bld: 112 mg/dL — ABNORMAL HIGH (ref 70–99)
Potassium: 3.8 mmol/L (ref 3.5–5.1)
Sodium: 138 mmol/L (ref 135–145)
Total Bilirubin: 0.4 mg/dL (ref <1.2)
Total Protein: 6 g/dL — ABNORMAL LOW (ref 6.5–8.1)

## 2023-04-28 MED ORDER — HEPARIN SOD (PORK) LOCK FLUSH 100 UNIT/ML IV SOLN
500.0000 [IU] | Freq: Once | INTRAVENOUS | Status: AC
  Administered 2023-04-28: 500 [IU] via INTRAVENOUS

## 2023-04-28 MED ORDER — SODIUM CHLORIDE 0.9% FLUSH
10.0000 mL | Freq: Once | INTRAVENOUS | Status: AC
  Administered 2023-04-28: 10 mL via INTRAVENOUS

## 2023-04-29 ENCOUNTER — Telehealth: Payer: Self-pay

## 2023-04-29 NOTE — Telephone Encounter (Signed)
-----   Message from Cassandra L Heilingoetter sent at 04/28/2023  3:38 PM EST ----- Can you call him and just review bleeding precautions ----- Message ----- From: Interface, [facility] [place] Sent: 04/28/2023   2:56 PM EST To: Cassandra L Heilingoetter, PA-C

## 2023-04-29 NOTE — Telephone Encounter (Signed)
Spoke with pt in regards to below. Pt verbalized understanding and was told to call with any concerns or questions.

## 2023-05-03 NOTE — Progress Notes (Unsigned)
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 Dorris Fetch. 2) Sunitinib 3 7.5 mg p.o. daily.  First dose started April 08, 2021.  Status post 3 months of treatment.  This treatment was discontinued secondary to disease progression. 3) Votrient (pazopanib) 800 mg p.o. daily.  Started August 10, 2021.  Status post 2 months of treatment discontinued secondary to disease progression. 4) status post redo right thoracotomy with resection of pleural tumors and lymph node dissection under the care of Dr. Dorris Fetch on Nov 13, 2021. 5) Systemic chemotherapy with doxorubicin 25 Mg/M2, ifosfamide 2500 Mg/M2 and mesna 2500 Mg/M2 days 1-3 every 3 weeks.  First dose February 12, 2022 at [facility] cancer center.  Status post 2 cycles of treatment discontinued secondary to disease progression. 6) Radiation to the prostate cancer, under the care of Dr. Kathrynn Running. Last day of radiation on 12/17/22 7) Systemic chemotherapy with Temodar 200 Mg/M2 on days 1-7 and day 15-21 with a Avastin 5 Mg/KG on days 8 and 22 every 4 weeks.  Status post 11 cycles . Discontinued due to disease progression  CURRENT THERAPY: Palliative systemic chemotherapy with gemcitabine 900 mg/m on days 1 and 8 and docetaxel 75 mg/m on day 8 IV every 3 weeks. He will receive Neulasta support on day 9. First dose on 04/07/23. Status post 1 cycle  INTERVAL HISTORY: Mathew [name] 62 y.o. male returns to the clinic today for a follow-up visit.  The patient was last seen in the clinic on 04/21/23. The patient was recently found  to have evidence of disease progression.  Therefore,  his treatment was switched to IV chemotherapy with gemcitabine and docetaxel.  He is status post day 1 cycle 1 and he tolerated it fairly well except for fatigue and thrombocytopenia.   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  For pain he is being managed by Dr. Emeline Darling in [place] with oxycodone, Marlowe Kays, in addition to Lyrica. He feels like his pain is about stable compared to last being seen. Some days are better than other. The pain is exacerbated by bowel movements and being active.   He saw Dr. Salomon Fick from [facility] and they agree with the current treatment, of course, there is concern for infection with the fistula. If he has progression, then options include decarbazine or Doxil.   Otherwise he denies any changes in his health.  Denies any fever or chills. He has some night sweats which just started. He denies signs of infection or sick contacts. Denies URI, cough, skin infections, abdominal pain, or dysuria. Denies any unexplained weight loss. His anemia is getting a little worse. He was previously on iron but is no longer on that due to concerns it will cause constipation. He denies any visible bleeding. He has not had any rectal bleeding in about *** weeks.  His breathing is stable/slightly more labored with rest but ok with exertion. He states after chemo he had a weird sensation in his tumor but denies pain. Denies any cough or chest pain.  Denies any headaches.  Denies any nausea, vomiting, or diarrhea.  He is regulating his bowel movements with Colace and MiraLAX.  He is here today for evaluation reviewed blood work before undergoing day 1 cycle 2.   MEDICAL HISTORY: Past Medical History:  Diagnosis Date   Anemia    Clubbing of nails    Deafness in left ear    Encounter for antineoplastic chemotherapy    History of kidney stones    Hypertension    Hypoglycemia due to neoplasm    Malignant neoplasm prostate [facility]) 07/2022    urologist--- dr pace;   bx 01/ 2024, Gleason 4+3   Malignant solitary fibrous neoplasm (HCC) 11/2015   oncologist--- @ [facility] Dr R. 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  MEDICATIONS:  Current Outpatient Medications  Medication Sig Dispense Refill   dexamethasone (DECADRON) 4 MG tablet Take 2 tabs by mouth 2 times daily starting day before chemo. Then take 2 tabs daily for 2 days starting day after chemo. Take with food. 30 tablet 1   docusate sodium (COLACE) 100 MG capsule Take 1 capsule (100 mg total) by mouth every 12 (twelve) hours. (Patient taking differently: Take 100 mg by mouth daily.) 60 capsule 0   folic acid (FOLVITE) 400 MCG tablet Take 400 mcg by mouth daily.     gabapentin (NEURONTIN) 100 MG capsule Take 300 mg by mouth 2 (two) times daily. (Patient not taking: Reported on 03/26/2023)     gabapentin (NEURONTIN) 600 MG tablet Take 600 mg by mouth 2 (two) times daily.     Multiple Vitamin (MULTIVITAMIN) capsule Take 1 capsule by mouth daily.     ondansetron (ZOFRAN) 8 MG tablet Take 1 tablet (8 mg total) by mouth every 8 (eight) hours as needed for nausea or vomiting. 30 tablet 1   oxyCODONE (ROXICODONE) 15 MG immediate release tablet Take 1 tablet (15 mg total) by mouth every 6 (six) hours as needed for pain. 60 tablet 0   polyethylene glycol (MIRALAX / GLYCOLAX) 17 g packet Take 17 g by mouth 2 (two) times daily.     pregabalin (LYRICA) 100 MG capsule Take 100 mg by mouth 2 (two) times daily.     tamsulosin (FLOMAX) 0.4 MG  CAPS capsule Take 0.4 mg by mouth at bedtime. Urology started 8/27     No current facility-administered medications for this visit.   Facility-Administered Medications Ordered in Other Visits  Medication Dose Route Frequency Provider Last Rate Last Admin   sodium phosphate (FLEET) 7-19 GM/118ML enema 1 enema  1 enema Rectal Once Noel Christmas, MD       sodium phosphate (FLEET) 7-19 GM/118ML enema 1 enema  1 enema Rectal Once Noel Christmas, MD        SURGICAL HISTORY:  Past Surgical History:  Procedure Laterality Date   GOLD SEED IMPLANT N/A 10/21/2022   Procedure: GOLD SEED IMPLANT;  Surgeon: Noel Christmas, MD;  Location: [facility];  Service:  Urology;  Laterality: N/A;   INTERCOSTAL NERVE BLOCK Right 04/27/2020   Procedure: INTERCOSTAL NERVE BLOCK;  Surgeon: Loreli Slot, MD;  Location: [facility] OR;  Service: Thoracic;  Laterality: Right;   KNEE CARTILAGE SURGERY Left 1976   RESECTION OF MEDIASTINAL MASS Right 01/23/2016   @[facility]  by dr Dorris Fetch;   resection chest wall mass w/ en bloc wedge resection of RLL   RESECTION OF MEDIASTINAL MASS Right 11/13/2021   Procedure: RESECTION OF PLEURAL TUMORS;  Surgeon: Loreli Slot, MD;  Location: [facility] OR;  Service: Thoracic;  Laterality: Right;   SPACE OAR INSTILLATION N/A 10/21/2022   Procedure: SPACE OAR INSTILLATION;  Surgeon: Noel Christmas, MD;  Location: [facility];  Service: Urology;  Laterality: N/A;   THORACOTOMY Right 04/27/2020   Procedure: THORACOTOMY;  Surgeon: Loreli Slot, MD;  Location: [facility] OR;  Service: Thoracic;  Laterality: Right;   THORACOTOMY Right 11/13/2021   Procedure: REDO THORACOTOMY;  Surgeon: Loreli Slot, MD;  Location: [facility] OR;  Service: Thoracic;  Laterality: Right;   TONSILLECTOMY     child   VIDEO BRONCHOSCOPY WITH ENDOBRONCHIAL ULTRASOUND N/A 12/17/2015   Procedure: VIDEO BRONCHOSCOPY WITH ENDOBRONCHIAL ULTRASOUND;  Surgeon: Loreli Slot,  MD;  Location: [facility] OR;  Service: Thoracic;  Laterality: N/A;    REVIEW OF SYSTEMS:   Review of Systems  Constitutional: Negative for appetite change, chills, fatigue, fever and unexpected weight change.  HENT:   Negative for mouth sores, nosebleeds, sore throat and trouble swallowing.   Eyes: Negative for eye problems and icterus.  Respiratory: Negative for cough, hemoptysis, shortness of breath and wheezing.   Cardiovascular: Negative for chest pain and leg swelling.  Gastrointestinal: Negative for abdominal pain, constipation, diarrhea, nausea and vomiting.  Genitourinary: Negative for bladder incontinence, difficulty urinating, dysuria, frequency and hematuria.   Musculoskeletal: Negative for back pain, gait problem, neck pain and neck stiffness.  Skin: Negative for itching and rash.  Neurological: Negative for dizziness, extremity weakness, gait problem, headaches, light-headedness and seizures.  Hematological: Negative for adenopathy. Does not bruise/bleed easily.  Psychiatric/Behavioral: Negative for confusion, depression and sleep disturbance. The patient is not nervous/anxious.     PHYSICAL EXAMINATION:  There were no vitals taken for this visit.  ECOG PERFORMANCE STATUS: {CHL ONC ECOG [id number]  Physical Exam  Constitutional: Oriented to person, place, and time and well-developed, well-nourished, and in no distress. No distress.  HENT:  Head: Normocephalic and atraumatic.  Mouth/Throat: Oropharynx is clear and moist. No oropharyngeal exudate.  Eyes: Conjunctivae are normal. Right eye exhibits no discharge. Left eye exhibits no discharge. No scleral icterus.  Neck: Normal range of motion. Neck supple.  Cardiovascular: Normal rate, regular rhythm, normal heart sounds and intact distal pulses.   Pulmonary/Chest: Effort normal and breath sounds normal. No respiratory distress. No wheezes. No rales.  Abdominal: Soft. Bowel sounds are normal. Exhibits no distension and no  mass. There is no tenderness.  Musculoskeletal: Normal range of motion. Exhibits no edema.  Lymphadenopathy:    No cervical adenopathy.  Neurological: Alert and oriented to person, place, and time. Exhibits normal muscle tone. Gait normal. Coordination normal.  Skin: Skin is warm and dry. No rash noted. Not diaphoretic. No erythema. No pallor.  Psychiatric: Mood, memory and judgment normal.  Vitals reviewed.  LABORATORY DATA: Lab Results  Component Value Date   WBC 10.9 (H) 04/28/2023   HGB 8.3 (L) 04/28/2023   HCT 26.7 (L) 04/28/2023   MCV 92.1 04/28/2023   PLT 49 (  L) 04/28/2023      Chemistry      Component Value Date/Time   NA 138 04/28/2023 1429   K 3.8 04/28/2023 1429   CL 101 04/28/2023 1429   CO2 30 04/28/2023 1429   BUN 17 04/28/2023 1429   CREATININE 0.99 04/28/2023 1429      Component Value Date/Time   CALCIUM 8.9 04/28/2023 1429   ALKPHOS 111 04/28/2023 1429   AST 12 (L) 04/28/2023 1429   ALT 8 04/28/2023 1429   BILITOT 0.4 04/28/2023 1429       RADIOGRAPHIC STUDIES:  No results found.   ASSESSMENT/PLAN:  This is a very pleasant 62 year old male with  1) recurrent solitary fibrous tumor of the right lung.  This was initially diagnosed in June 2017.  2) prostate adenocarcinoma with Gleason score of 7 (4+3) diagnosed in February 2024 followed by urology.  He is status post resection.  He had recurrence in October 2021 and underwent re-resection under the care of Dr. Dorris Fetch.  He had been on observation as there is no role for adjuvant therapy for his condition.  He was seen by Dr. Lin Givens at [facility] who recommended observation and consideration of treatment with Sunitinib followed by surgery if the patient had disease recurrence.    The patient had a new 1.4 cm nodule posteriorly and the patient underwent treatment with sunitinib 37.5 mg p.o. daily.  He was on this for 3 months. He had been tolerating this fairly well except for  hypertension.  Unfortunately scan showed evidence of disease progression with interval development of multiple new pleural-based nodules in the right hemithorax measuring up to 2.9 cm which is concerning for recurrent/metastatic disease.    He was then seen by Dr. Dorris Fetch who offered surgical resection.  Dr. Lin Givens recommended neoadjuvant treatment with pazopanib for 8 weeks followed by surgical resection if he had improvement in his disease followed by continued adjuvant treatment for another 6 to 8 months.  Unfortunately, after 2 months of treatment the CT scan showed clear evidence of disease progression with enlarging right pleural-based masses.  He was referred to Dr. 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  Molecular studies by foundation 1 and PD-L1 did not show any actionable mutations and his PD-L1 expression was negative.    The patient then saw Dr. Waymon Amato at [facility] who recommended inpatient systemic chemotherapy with doxorubicin, ifosfamide, and mesna IV every 3 weeks.  He had a 2D echo as well as a Port-A-Cath placed.  This was discontinued due to evidence of disease progression.    Dr. Waymon Amato recommended for the patient treatment with Temodar 200 Mg/M2 on days 1-7 and 15-21 in addition to Avastin 5 Mg/M2 on days 8 and 22 every 4 weeks.  He is status post 11 cycles.    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 His avastin can cause delayed wound healing. Additionally, he was found to have disease progression so treatment was discontinued. Dr. Arbutus Ped spoke with Dr. Salomon Fick from [facility].  Dr. Salomon Fick is in agreement with the patient's current regimen, but of course, the concern for infection is there with his fistula.  They do not have any phase 1  clinical trials at this time.  If he had progression in the future she would recommend possibly  dacarbazine or Doxil.    He saw Dr. Cherylann Ratel who recommended gemcitabine 900 mg/m IV on days 1 and 8 and docetaxel 75 mg/m on day 8 with Neulasta support on day 9. For outpatient premedications, he recommenced dexamethasone 8 mg p.o. twice a day the day before and the day after day 8 of chemotherapy.   The patient was seen with Dr. Arbutus Ped. Labs were reviewed. Recommend ***.   ***Dose ***  We will see him in 3 weeks with cycle #2.    He will continue to follow with Dr. Emeline Darling for pain management.   Iron ***  The patient was advised to call immediately if he has any concerning symptoms in the interval. The patient voices understanding of current disease status and treatment options and is in agreement with the current care plan. All questions were answered. The patient knows to call the clinic with any problems, questions or concerns. We can certainly see the patient much sooner if necessary        No orders of the defined types were placed in this encounter.    I spent {CHL ONC TIME VISIT - WUJWJ:[id number]} counseling the patient face to face. The total time spent in the appointment was {CHL ONC TIME VISIT - OZHYQ:[id number]}.  Tiara Maultsby L Deannah Rossi, PA-C 05/03/23

## 2023-05-05 ENCOUNTER — Inpatient Hospital Stay: Payer: BC Managed Care – PPO

## 2023-05-05 ENCOUNTER — Inpatient Hospital Stay (HOSPITAL_BASED_OUTPATIENT_CLINIC_OR_DEPARTMENT_OTHER): Payer: BC Managed Care – PPO | Admitting: Physician Assistant

## 2023-05-05 VITALS — BP 130/69 | HR 99 | Temp 98.5°F | Resp 17 | Wt 169.6 lb

## 2023-05-05 DIAGNOSIS — D492 Neoplasm of unspecified behavior of bone, soft tissue, and skin: Secondary | ICD-10-CM

## 2023-05-05 DIAGNOSIS — R300 Dysuria: Secondary | ICD-10-CM

## 2023-05-05 DIAGNOSIS — D6481 Anemia due to antineoplastic chemotherapy: Secondary | ICD-10-CM

## 2023-05-05 DIAGNOSIS — T451X5A Adverse effect of antineoplastic and immunosuppressive drugs, initial encounter: Secondary | ICD-10-CM

## 2023-05-05 DIAGNOSIS — C61 Malignant neoplasm of prostate: Secondary | ICD-10-CM

## 2023-05-05 DIAGNOSIS — R609 Edema, unspecified: Secondary | ICD-10-CM

## 2023-05-05 DIAGNOSIS — Z5111 Encounter for antineoplastic chemotherapy: Secondary | ICD-10-CM

## 2023-05-05 LAB — URINALYSIS, COMPLETE (UACMP) WITH MICROSCOPIC
Bacteria, UA: NONE SEEN
Bilirubin Urine: NEGATIVE
Glucose, UA: NEGATIVE mg/dL
Hgb urine dipstick: NEGATIVE
Ketones, ur: NEGATIVE mg/dL
Leukocytes,Ua: NEGATIVE
Nitrite: NEGATIVE
Protein, ur: 30 mg/dL — AB
Specific Gravity, Urine: 1.021 (ref 1.005–1.030)
pH: 5 (ref 5.0–8.0)

## 2023-05-05 LAB — CBC WITH DIFFERENTIAL (CANCER CENTER ONLY)
Abs Immature Granulocytes: 0.72 10*3/uL — ABNORMAL HIGH (ref 0.00–0.07)
Basophils Absolute: 0.1 10*3/uL (ref 0.0–0.1)
Basophils Relative: 0 %
Eosinophils Absolute: 0 10*3/uL (ref 0.0–0.5)
Eosinophils Relative: 0 %
HCT: 27.1 % — ABNORMAL LOW (ref 39.0–52.0)
Hemoglobin: 8.4 g/dL — ABNORMAL LOW (ref 13.0–17.0)
Immature Granulocytes: 5 %
Lymphocytes Relative: 4 %
Lymphs Abs: 0.6 10*3/uL — ABNORMAL LOW (ref 0.7–4.0)
MCH: 28.6 pg (ref 26.0–34.0)
MCHC: 31 g/dL (ref 30.0–36.0)
MCV: 92.2 fL (ref 80.0–100.0)
Monocytes Absolute: 0.8 10*3/uL (ref 0.1–1.0)
Monocytes Relative: 5 %
Neutro Abs: 12.4 10*3/uL — ABNORMAL HIGH (ref 1.7–7.7)
Neutrophils Relative %: 86 %
Platelet Count: 323 10*3/uL (ref 150–400)
RBC: 2.94 MIL/uL — ABNORMAL LOW (ref 4.22–5.81)
RDW: 16.2 % — ABNORMAL HIGH (ref 11.5–15.5)
WBC Count: 14.6 10*3/uL — ABNORMAL HIGH (ref 4.0–10.5)
nRBC: 0.3 % — ABNORMAL HIGH (ref 0.0–0.2)

## 2023-05-05 LAB — CMP (CANCER CENTER ONLY)
ALT: 7 U/L (ref 0–44)
AST: 12 U/L — ABNORMAL LOW (ref 15–41)
Albumin: 2.7 g/dL — ABNORMAL LOW (ref 3.5–5.0)
Alkaline Phosphatase: 100 U/L (ref 38–126)
Anion gap: 6 (ref 5–15)
BUN: 9 mg/dL (ref 8–23)
CO2: 30 mmol/L (ref 22–32)
Calcium: 8.4 mg/dL — ABNORMAL LOW (ref 8.9–10.3)
Chloride: 102 mmol/L (ref 98–111)
Creatinine: 0.91 mg/dL (ref 0.61–1.24)
GFR, Estimated: >60 mL/min (ref >60)
Glucose, Bld: 128 mg/dL — ABNORMAL HIGH (ref 70–99)
Potassium: 3.4 mmol/L — ABNORMAL LOW (ref 3.5–5.1)
Sodium: 138 mmol/L (ref 135–145)
Total Bilirubin: 0.3 mg/dL (ref <1.2)
Total Protein: 5.8 g/dL — ABNORMAL LOW (ref 6.5–8.1)

## 2023-05-05 MED ORDER — SODIUM CHLORIDE 0.9 % IV SOLN: INTRAVENOUS | Status: DC

## 2023-05-05 MED ORDER — SODIUM CHLORIDE 0.9 % IV SOLN
900.0000 mg/m2 | Freq: Once | INTRAVENOUS | Status: AC
  Administered 2023-05-05: 1711 mg via INTRAVENOUS
  Filled 2023-05-05: qty 45

## 2023-05-05 MED ORDER — PROCHLORPERAZINE MALEATE 10 MG PO TABS
10.0000 mg | ORAL_TABLET | Freq: Once | ORAL | Status: AC
  Administered 2023-05-05: 10 mg via ORAL
  Filled 2023-05-05: qty 1

## 2023-05-05 MED ORDER — HEPARIN SOD (PORK) LOCK FLUSH 100 UNIT/ML IV SOLN
500.0000 [IU] | Freq: Once | INTRAVENOUS | Status: AC | PRN
  Administered 2023-05-05: 500 [IU]

## 2023-05-05 MED ORDER — FUROSEMIDE 20 MG PO TABS: 20.0000 mg | ORAL_TABLET | Freq: Every day | ORAL | 0 refills | Status: DC | PRN

## 2023-05-05 MED ORDER — SODIUM CHLORIDE 0.9% FLUSH
10.0000 mL | INTRAVENOUS | Status: DC | PRN
Start: 2023-05-05 — End: 2023-05-05
  Administered 2023-05-05: 10 mL

## 2023-05-05 NOTE — Patient Instructions (Signed)
 [facility] - A DEPT OF [facility]  Discharge Instructions: Thank you for choosing [facility] to provide your oncology and hematology care.   If you have a lab appointment with the Cancer Center, please go directly to the Cancer Center and check in at the registration area.   Wear comfortable clothing and clothing appropriate for easy access to any Portacath or PICC line.   We strive to give you quality time with your provider. You may need to reschedule your appointment if you arrive late (15 or more minutes).  Arriving late affects you and other patients whose appointments are after yours.  Also, if you miss three or more appointments without notifying the office, you may be dismissed from the clinic at the provider's discretion.      For prescription refill requests, have your pharmacy contact our office and allow 72 hours for refills to be completed.    Today you received the following chemotherapy and/or immunotherapy agents: Gemcitabine.       To help prevent nausea and vomiting after your treatment, we encourage you to take your nausea medication as directed.  BELOW ARE SYMPTOMS THAT SHOULD BE REPORTED IMMEDIATELY: *FEVER GREATER THAN 100.4 F (38 C) OR HIGHER *CHILLS OR SWEATING *NAUSEA AND VOMITING THAT IS NOT CONTROLLED WITH YOUR NAUSEA MEDICATION *UNUSUAL SHORTNESS OF BREATH *UNUSUAL BRUISING OR BLEEDING *URINARY PROBLEMS (pain or burning when urinating, or frequent urination) *BOWEL PROBLEMS (unusual diarrhea, constipation, pain near the anus) TENDERNESS IN MOUTH AND THROAT WITH OR WITHOUT PRESENCE OF ULCERS (sore throat, sores in mouth, or a toothache) UNUSUAL RASH, SWELLING OR PAIN  UNUSUAL VAGINAL DISCHARGE OR ITCHING   Items with * indicate a potential emergency and should be followed up as soon as possible or go to the Emergency Department if any problems should occur.  Please show the CHEMOTHERAPY ALERT CARD or  IMMUNOTHERAPY ALERT CARD at check-in to the Emergency Department and triage nurse.  Should you have questions after your visit or need to cancel or reschedule your appointment, please contact [facility] - A DEPT OF [facility]  Dept: [phone]  and follow the prompts.  Office hours are 8:00 a.m. to 4:30 p.m. Monday - Friday. Please note that voicemails left after 4:00 p.m. may not be returned until the following business day.  We are closed weekends and major holidays. You have access to a nurse at all times for urgent questions. Please call the main number to the clinic Dept: [phone] and follow the prompts.   For any non-urgent questions, you may also contact your provider using MyChart. We now offer e-Visits for anyone 57 and older to request care online for non-urgent symptoms. For details visit [email or web address].   Also download the MyChart app! Go to the app store, search "MyChart", open the app, select [place], and log in with your MyChart username and password.

## 2023-05-06 LAB — URINE CULTURE: Culture: NO GROWTH

## 2023-05-11 ENCOUNTER — Other Ambulatory Visit: Payer: Self-pay | Admitting: *Deleted

## 2023-05-11 ENCOUNTER — Inpatient Hospital Stay: Payer: BC Managed Care – PPO

## 2023-05-11 ENCOUNTER — Other Ambulatory Visit: Payer: Self-pay | Admitting: Medical Oncology

## 2023-05-11 VITALS — BP 106/67 | HR 73 | Temp 98.7°F | Resp 16

## 2023-05-11 DIAGNOSIS — D492 Neoplasm of unspecified behavior of bone, soft tissue, and skin: Secondary | ICD-10-CM

## 2023-05-11 DIAGNOSIS — C61 Malignant neoplasm of prostate: Secondary | ICD-10-CM | POA: Diagnosis not present

## 2023-05-11 DIAGNOSIS — D649 Anemia, unspecified: Secondary | ICD-10-CM

## 2023-05-11 DIAGNOSIS — Z95828 Presence of other vascular implants and grafts: Secondary | ICD-10-CM

## 2023-05-11 LAB — CBC WITH DIFFERENTIAL (CANCER CENTER ONLY)
Abs Immature Granulocytes: 0.05 10*3/uL (ref 0.00–0.07)
Basophils Absolute: 0 10*3/uL (ref 0.0–0.1)
Basophils Relative: 0 %
Eosinophils Absolute: 0 10*3/uL (ref 0.0–0.5)
Eosinophils Relative: 0 %
HCT: 23.5 % — ABNORMAL LOW (ref 39.0–52.0)
Hemoglobin: 7.4 g/dL — ABNORMAL LOW (ref 13.0–17.0)
Immature Granulocytes: 1 %
Lymphocytes Relative: 5 %
Lymphs Abs: 0.4 10*3/uL — ABNORMAL LOW (ref 0.7–4.0)
MCH: 28.6 pg (ref 26.0–34.0)
MCHC: 31.5 g/dL (ref 30.0–36.0)
MCV: 90.7 fL (ref 80.0–100.0)
Monocytes Absolute: 0.8 10*3/uL (ref 0.1–1.0)
Monocytes Relative: 10 %
Neutro Abs: 6.9 10*3/uL (ref 1.7–7.7)
Neutrophils Relative %: 84 %
Platelet Count: 316 10*3/uL (ref 150–400)
RBC: 2.59 MIL/uL — ABNORMAL LOW (ref 4.22–5.81)
RDW: 16.1 % — ABNORMAL HIGH (ref 11.5–15.5)
WBC Count: 8.2 10*3/uL (ref 4.0–10.5)
nRBC: 0 % (ref 0.0–0.2)

## 2023-05-11 LAB — PREPARE RBC (CROSSMATCH)

## 2023-05-11 LAB — CMP (CANCER CENTER ONLY)
ALT: 11 U/L (ref 0–44)
AST: 16 U/L (ref 15–41)
Albumin: 2.9 g/dL — ABNORMAL LOW (ref 3.5–5.0)
Alkaline Phosphatase: 124 U/L (ref 38–126)
Anion gap: 5 (ref 5–15)
BUN: 16 mg/dL (ref 8–23)
CO2: 30 mmol/L (ref 22–32)
Calcium: 9.2 mg/dL (ref 8.9–10.3)
Chloride: 101 mmol/L (ref 98–111)
Creatinine: 0.73 mg/dL (ref 0.61–1.24)
GFR, Estimated: >60 mL/min (ref >60)
Glucose, Bld: 111 mg/dL — ABNORMAL HIGH (ref 70–99)
Potassium: 3.9 mmol/L (ref 3.5–5.1)
Sodium: 136 mmol/L (ref 135–145)
Total Bilirubin: 0.3 mg/dL (ref <1.2)
Total Protein: 6.4 g/dL — ABNORMAL LOW (ref 6.5–8.1)

## 2023-05-11 LAB — SAMPLE TO BLOOD BANK

## 2023-05-11 MED ORDER — HEPARIN SOD (PORK) LOCK FLUSH 100 UNIT/ML IV SOLN: 500.0000 [IU] | Freq: Once | INTRAVENOUS | Status: DC

## 2023-05-11 MED ORDER — SODIUM CHLORIDE 0.9% FLUSH: 10.0000 mL | Freq: Once | INTRAVENOUS | Status: DC

## 2023-05-11 MED ORDER — SODIUM CHLORIDE 0.9 % IV SOLN
900.0000 mg/m2 | Freq: Once | INTRAVENOUS | Status: AC
Start: 2023-05-11 — End: 2023-05-11
  Administered 2023-05-11: 1711 mg via INTRAVENOUS
  Filled 2023-05-11: qty 45

## 2023-05-11 MED ORDER — SODIUM CHLORIDE 0.9% FLUSH
10.0000 mL | INTRAVENOUS | Status: DC | PRN
  Administered 2023-05-11: 10 mL via INTRAVENOUS

## 2023-05-11 MED ORDER — HEPARIN SOD (PORK) LOCK FLUSH 100 UNIT/ML IV SOLN
500.0000 [IU] | Freq: Once | INTRAVENOUS | Status: AC | PRN
  Administered 2023-05-11: 500 [IU]

## 2023-05-11 MED ORDER — SODIUM CHLORIDE 0.9% FLUSH
10.0000 mL | INTRAVENOUS | Status: DC | PRN
  Administered 2023-05-11: 10 mL

## 2023-05-11 MED ORDER — SODIUM CHLORIDE 0.9 % IV SOLN
75.0000 mg/m2 | Freq: Once | INTRAVENOUS | Status: AC
Start: 2023-05-11 — End: 2023-05-11
  Administered 2023-05-11: 143 mg via INTRAVENOUS
  Filled 2023-05-11: qty 14.3

## 2023-05-11 MED ORDER — DEXAMETHASONE SODIUM PHOSPHATE 10 MG/ML IJ SOLN
10.0000 mg | Freq: Once | INTRAMUSCULAR | Status: AC
Start: 2023-05-11 — End: 2023-05-11
  Administered 2023-05-11: 10 mg via INTRAVENOUS
  Filled 2023-05-11: qty 1

## 2023-05-11 MED ORDER — ALTEPLASE 2 MG IJ SOLR: 2.0000 mg | Freq: Once | INTRAMUSCULAR | Status: DC | PRN

## 2023-05-11 MED ORDER — SODIUM CHLORIDE 0.9 % IV SOLN: INTRAVENOUS | Status: DC

## 2023-05-11 NOTE — Progress Notes (Signed)
Sample to BB ordered.

## 2023-05-11 NOTE — Patient Instructions (Signed)
[facility] - A DEPT OF [facility]  Discharge Instructions: Thank you for choosing [facility] to provide your oncology and hematology care.   If you have a lab appointment with the Cancer Center, please go directly to the Cancer Center and check in at the registration area.   Wear comfortable clothing and clothing appropriate for easy access to any Portacath or PICC line.   We strive to give you quality time with your provider. You may need to reschedule your appointment if you arrive late (15 or more minutes).  Arriving late affects you and other patients whose appointments are after yours.  Also, if you miss three or more appointments without notifying the office, you may be dismissed from the clinic at the provider's discretion.      For prescription refill requests, have your pharmacy contact our office and allow 72 hours for refills to be completed.    Today you received the following chemotherapy and/or immunotherapy agents: Gemzar/Docetaxel    To help prevent nausea and vomiting after your treatment, we encourage you to take your nausea medication as directed.  BELOW ARE SYMPTOMS THAT SHOULD BE REPORTED IMMEDIATELY: *FEVER GREATER THAN 100.4 F (38 C) OR HIGHER *CHILLS OR SWEATING *NAUSEA AND VOMITING THAT IS NOT CONTROLLED WITH YOUR NAUSEA MEDICATION *UNUSUAL SHORTNESS OF BREATH *UNUSUAL BRUISING OR BLEEDING *URINARY PROBLEMS (pain or burning when urinating, or frequent urination) *BOWEL PROBLEMS (unusual diarrhea, constipation, pain near the anus) TENDERNESS IN MOUTH AND THROAT WITH OR WITHOUT PRESENCE OF ULCERS (sore throat, sores in mouth, or a toothache) UNUSUAL RASH, SWELLING OR PAIN  UNUSUAL VAGINAL DISCHARGE OR ITCHING   Items with * indicate a potential emergency and should be followed up as soon as possible or go to the Emergency Department if any problems should occur.  Please show the CHEMOTHERAPY ALERT CARD or  IMMUNOTHERAPY ALERT CARD at check-in to the Emergency Department and triage nurse.  Should you have questions after your visit or need to cancel or reschedule your appointment, please contact [facility] - A DEPT OF [facility]  Dept: [phone]  and follow the prompts.  Office hours are 8:00 a.m. to 4:30 p.m. Monday - Friday. Please note that voicemails left after 4:00 p.m. may not be returned until the following business day.  We are closed weekends and major holidays. You have access to a nurse at all times for urgent questions. Please call the main number to the clinic Dept: [phone] and follow the prompts.   For any non-urgent questions, you may also contact your provider using MyChart. We now offer e-Visits for anyone 69 and older to request care online for non-urgent symptoms. For details visit [email or web address].   Also download the MyChart app! Go to the app store, search "MyChart", open the app, select [place], and log in with your MyChart username and password.

## 2023-05-11 NOTE — Progress Notes (Signed)
Blood transfusion orders entered.

## 2023-05-12 ENCOUNTER — Ambulatory Visit: Payer: BC Managed Care – PPO

## 2023-05-12 ENCOUNTER — Other Ambulatory Visit: Payer: BC Managed Care – PPO

## 2023-05-12 ENCOUNTER — Inpatient Hospital Stay: Payer: BC Managed Care – PPO

## 2023-05-12 DIAGNOSIS — D649 Anemia, unspecified: Secondary | ICD-10-CM

## 2023-05-12 DIAGNOSIS — C61 Malignant neoplasm of prostate: Secondary | ICD-10-CM | POA: Diagnosis not present

## 2023-05-12 MED ORDER — HEPARIN SOD (PORK) LOCK FLUSH 100 UNIT/ML IV SOLN
500.0000 [IU] | Freq: Once | INTRAVENOUS | Status: AC
  Administered 2023-05-12: 500 [IU]

## 2023-05-12 MED ORDER — ACETAMINOPHEN 325 MG PO TABS
650.0000 mg | ORAL_TABLET | Freq: Once | ORAL | Status: AC
  Administered 2023-05-12: 650 mg via ORAL
  Filled 2023-05-12: qty 2

## 2023-05-12 MED ORDER — DIPHENHYDRAMINE HCL 25 MG PO CAPS
25.0000 mg | ORAL_CAPSULE | Freq: Once | ORAL | Status: AC
  Administered 2023-05-12: 25 mg via ORAL
  Filled 2023-05-12: qty 1

## 2023-05-12 MED ORDER — SODIUM CHLORIDE 0.9% IV SOLUTION
250.0000 mL | Freq: Once | INTRAVENOUS | Status: AC
  Administered 2023-05-12: 100 mL via INTRAVENOUS

## 2023-05-12 MED ORDER — SODIUM CHLORIDE 0.9% FLUSH
10.0000 mL | Freq: Once | INTRAVENOUS | Status: AC
  Administered 2023-05-12: 10 mL

## 2023-05-12 NOTE — Patient Instructions (Signed)

## 2023-05-13 ENCOUNTER — Inpatient Hospital Stay: Payer: BC Managed Care – PPO

## 2023-05-13 VITALS — BP 104/61 | HR 100 | Temp 98.5°F | Resp 17

## 2023-05-13 DIAGNOSIS — D492 Neoplasm of unspecified behavior of bone, soft tissue, and skin: Secondary | ICD-10-CM

## 2023-05-13 DIAGNOSIS — C61 Malignant neoplasm of prostate: Secondary | ICD-10-CM | POA: Diagnosis not present

## 2023-05-13 MED ORDER — PEGFILGRASTIM-JMDB 6 MG/0.6ML ~~LOC~~ SOSY
6.0000 mg | PREFILLED_SYRINGE | Freq: Once | SUBCUTANEOUS | Status: AC
Start: 2023-05-13 — End: 2023-05-13
  Administered 2023-05-13: 6 mg via SUBCUTANEOUS
  Filled 2023-05-13: qty 0.6

## 2023-05-15 LAB — TYPE AND SCREEN
ABO/RH(D): O POS
Antibody Screen: NEGATIVE
Unit division: 0

## 2023-05-15 LAB — BPAM RBC
Blood Product Expiration Date: 202412262359
ISSUE DATE / TIME: 202411261141
Unit Type and Rh: 5100

## 2023-05-19 ENCOUNTER — Inpatient Hospital Stay: Payer: BC Managed Care – PPO | Attending: Internal Medicine

## 2023-05-19 ENCOUNTER — Telehealth: Payer: Self-pay | Admitting: Physician Assistant

## 2023-05-19 ENCOUNTER — Other Ambulatory Visit: Payer: Self-pay | Admitting: Physician Assistant

## 2023-05-19 DIAGNOSIS — Z7952 Long term (current) use of systemic steroids: Secondary | ICD-10-CM | POA: Insufficient documentation

## 2023-05-19 DIAGNOSIS — Z87442 Personal history of urinary calculi: Secondary | ICD-10-CM | POA: Diagnosis not present

## 2023-05-19 DIAGNOSIS — Z5111 Encounter for antineoplastic chemotherapy: Secondary | ICD-10-CM | POA: Diagnosis not present

## 2023-05-19 DIAGNOSIS — D492 Neoplasm of unspecified behavior of bone, soft tissue, and skin: Secondary | ICD-10-CM

## 2023-05-19 DIAGNOSIS — C61 Malignant neoplasm of prostate: Secondary | ICD-10-CM | POA: Insufficient documentation

## 2023-05-19 DIAGNOSIS — K604 Rectal fistula, unspecified: Secondary | ICD-10-CM | POA: Insufficient documentation

## 2023-05-19 DIAGNOSIS — R0989 Other specified symptoms and signs involving the circulatory and respiratory systems: Secondary | ICD-10-CM | POA: Insufficient documentation

## 2023-05-19 DIAGNOSIS — D649 Anemia, unspecified: Secondary | ICD-10-CM | POA: Diagnosis not present

## 2023-05-19 DIAGNOSIS — I10 Essential (primary) hypertension: Secondary | ICD-10-CM | POA: Diagnosis not present

## 2023-05-19 DIAGNOSIS — E559 Vitamin D deficiency, unspecified: Secondary | ICD-10-CM | POA: Diagnosis not present

## 2023-05-19 DIAGNOSIS — R110 Nausea: Secondary | ICD-10-CM | POA: Insufficient documentation

## 2023-05-19 DIAGNOSIS — R0602 Shortness of breath: Secondary | ICD-10-CM | POA: Insufficient documentation

## 2023-05-19 DIAGNOSIS — R918 Other nonspecific abnormal finding of lung field: Secondary | ICD-10-CM | POA: Diagnosis not present

## 2023-05-19 DIAGNOSIS — M1990 Unspecified osteoarthritis, unspecified site: Secondary | ICD-10-CM | POA: Diagnosis not present

## 2023-05-19 DIAGNOSIS — Z79899 Other long term (current) drug therapy: Secondary | ICD-10-CM | POA: Insufficient documentation

## 2023-05-19 DIAGNOSIS — Z923 Personal history of irradiation: Secondary | ICD-10-CM | POA: Diagnosis not present

## 2023-05-19 DIAGNOSIS — D6481 Anemia due to antineoplastic chemotherapy: Secondary | ICD-10-CM

## 2023-05-19 DIAGNOSIS — Z95828 Presence of other vascular implants and grafts: Secondary | ICD-10-CM

## 2023-05-19 LAB — CBC WITH DIFFERENTIAL (CANCER CENTER ONLY)
Abs Immature Granulocytes: 1.08 10*3/uL — ABNORMAL HIGH (ref 0.00–0.07)
Basophils Absolute: 0.1 10*3/uL (ref 0.0–0.1)
Basophils Relative: 0 %
Eosinophils Absolute: 0.1 10*3/uL (ref 0.0–0.5)
Eosinophils Relative: 0 %
HCT: 25.7 % — ABNORMAL LOW (ref 39.0–52.0)
Hemoglobin: 7.9 g/dL — ABNORMAL LOW (ref 13.0–17.0)
Immature Granulocytes: 5 %
Lymphocytes Relative: 3 %
Lymphs Abs: 0.5 10*3/uL — ABNORMAL LOW (ref 0.7–4.0)
MCH: 28.2 pg (ref 26.0–34.0)
MCHC: 30.7 g/dL (ref 30.0–36.0)
MCV: 91.8 fL (ref 80.0–100.0)
Monocytes Absolute: 1 10*3/uL (ref 0.1–1.0)
Monocytes Relative: 5 %
Neutro Abs: 17.1 10*3/uL — ABNORMAL HIGH (ref 1.7–7.7)
Neutrophils Relative %: 87 %
Platelet Count: 59 10*3/uL — ABNORMAL LOW (ref 150–400)
RBC: 2.8 MIL/uL — ABNORMAL LOW (ref 4.22–5.81)
RDW: 16.9 % — ABNORMAL HIGH (ref 11.5–15.5)
WBC Count: 19.9 10*3/uL — ABNORMAL HIGH (ref 4.0–10.5)
nRBC: 0.8 % — ABNORMAL HIGH (ref 0.0–0.2)

## 2023-05-19 LAB — SAMPLE TO BLOOD BANK

## 2023-05-19 LAB — CMP (CANCER CENTER ONLY)
ALT: 10 U/L (ref 0–44)
AST: 14 U/L — ABNORMAL LOW (ref 15–41)
Albumin: 3 g/dL — ABNORMAL LOW (ref 3.5–5.0)
Alkaline Phosphatase: 151 U/L — ABNORMAL HIGH (ref 38–126)
Anion gap: 5 (ref 5–15)
BUN: 16 mg/dL (ref 8–23)
CO2: 32 mmol/L (ref 22–32)
Calcium: 9.2 mg/dL (ref 8.9–10.3)
Chloride: 103 mmol/L (ref 98–111)
Creatinine: 0.84 mg/dL (ref 0.61–1.24)
GFR, Estimated: >60 mL/min (ref >60)
Glucose, Bld: 89 mg/dL (ref 70–99)
Potassium: 3.6 mmol/L (ref 3.5–5.1)
Sodium: 140 mmol/L (ref 135–145)
Total Bilirubin: 0.3 mg/dL (ref <1.2)
Total Protein: 6.1 g/dL — ABNORMAL LOW (ref 6.5–8.1)

## 2023-05-19 LAB — PREPARE RBC (CROSSMATCH)

## 2023-05-19 MED ORDER — SODIUM CHLORIDE 0.9% FLUSH
10.0000 mL | Freq: Once | INTRAVENOUS | Status: AC
Start: 2023-05-19 — End: 2023-05-19
  Administered 2023-05-19: 10 mL

## 2023-05-19 MED ORDER — HEPARIN SOD (PORK) LOCK FLUSH 100 UNIT/ML IV SOLN
500.0000 [IU] | Freq: Once | INTRAVENOUS | Status: AC
  Administered 2023-05-19: 500 [IU]

## 2023-05-19 NOTE — Telephone Encounter (Signed)
I called the patient regarding arranging for another 1 unit of blood. We are able to administer this Friday at 11:30. He confirmed appointment time. Orders entered. He is aware to keep his blue bracelet on.

## 2023-05-22 ENCOUNTER — Inpatient Hospital Stay: Payer: BC Managed Care – PPO

## 2023-05-22 VITALS — BP 132/76 | HR 105 | Temp 98.0°F | Resp 18 | Wt 160.4 lb

## 2023-05-22 DIAGNOSIS — Z95828 Presence of other vascular implants and grafts: Secondary | ICD-10-CM

## 2023-05-22 DIAGNOSIS — D6481 Anemia due to antineoplastic chemotherapy: Secondary | ICD-10-CM

## 2023-05-22 DIAGNOSIS — C61 Malignant neoplasm of prostate: Secondary | ICD-10-CM | POA: Diagnosis not present

## 2023-05-22 MED ORDER — DIPHENHYDRAMINE HCL 25 MG PO CAPS
25.0000 mg | ORAL_CAPSULE | Freq: Once | ORAL | Status: AC
  Administered 2023-05-22: 25 mg via ORAL
  Filled 2023-05-22: qty 1

## 2023-05-22 MED ORDER — HEPARIN SOD (PORK) LOCK FLUSH 100 UNIT/ML IV SOLN
500.0000 [IU] | Freq: Once | INTRAVENOUS | Status: AC
  Administered 2023-05-22: 500 [IU]

## 2023-05-22 MED ORDER — SODIUM CHLORIDE 0.9% FLUSH
10.0000 mL | Freq: Once | INTRAVENOUS | Status: AC
  Administered 2023-05-22: 10 mL

## 2023-05-22 MED ORDER — SODIUM CHLORIDE 0.9% IV SOLUTION
250.0000 mL | INTRAVENOUS | Status: DC
  Administered 2023-05-22: 250 mL via INTRAVENOUS

## 2023-05-22 MED ORDER — ACETAMINOPHEN 325 MG PO TABS
650.0000 mg | ORAL_TABLET | Freq: Once | ORAL | Status: AC
  Administered 2023-05-22: 650 mg via ORAL
  Filled 2023-05-22: qty 2

## 2023-05-23 LAB — TYPE AND SCREEN
ABO/RH(D): O POS
Antibody Screen: NEGATIVE
Unit division: 0

## 2023-05-23 LAB — BPAM RBC
Blood Product Expiration Date: 202412272359
ISSUE DATE / TIME: 202412061213
Unit Type and Rh: 5100

## 2023-05-25 ENCOUNTER — Inpatient Hospital Stay (HOSPITAL_BASED_OUTPATIENT_CLINIC_OR_DEPARTMENT_OTHER): Payer: BC Managed Care – PPO | Admitting: Internal Medicine

## 2023-05-25 ENCOUNTER — Inpatient Hospital Stay: Payer: BC Managed Care – PPO

## 2023-05-25 VITALS — BP 120/77 | HR 100 | Resp 17

## 2023-05-25 VITALS — BP 110/73 | HR 113 | Temp 98.7°F | Resp 18 | Wt 159.3 lb

## 2023-05-25 DIAGNOSIS — C61 Malignant neoplasm of prostate: Secondary | ICD-10-CM

## 2023-05-25 DIAGNOSIS — D6481 Anemia due to antineoplastic chemotherapy: Secondary | ICD-10-CM

## 2023-05-25 DIAGNOSIS — D492 Neoplasm of unspecified behavior of bone, soft tissue, and skin: Secondary | ICD-10-CM | POA: Diagnosis not present

## 2023-05-25 DIAGNOSIS — Z95828 Presence of other vascular implants and grafts: Secondary | ICD-10-CM

## 2023-05-25 LAB — CBC WITH DIFFERENTIAL (CANCER CENTER ONLY)
Abs Immature Granulocytes: 0.78 10*3/uL — ABNORMAL HIGH (ref 0.00–0.07)
Basophils Absolute: 0.1 10*3/uL (ref 0.0–0.1)
Basophils Relative: 1 %
Eosinophils Absolute: 0.2 10*3/uL (ref 0.0–0.5)
Eosinophils Relative: 1 %
HCT: 30.6 % — ABNORMAL LOW (ref 39.0–52.0)
Hemoglobin: 9.4 g/dL — ABNORMAL LOW (ref 13.0–17.0)
Immature Granulocytes: 4 %
Lymphocytes Relative: 3 %
Lymphs Abs: 0.6 10*3/uL — ABNORMAL LOW (ref 0.7–4.0)
MCH: 27.9 pg (ref 26.0–34.0)
MCHC: 30.7 g/dL (ref 30.0–36.0)
MCV: 90.8 fL (ref 80.0–100.0)
Monocytes Absolute: 1.5 10*3/uL — ABNORMAL HIGH (ref 0.1–1.0)
Monocytes Relative: 8 %
Neutro Abs: 16.2 10*3/uL — ABNORMAL HIGH (ref 1.7–7.7)
Neutrophils Relative %: 83 %
Platelet Count: 409 10*3/uL — ABNORMAL HIGH (ref 150–400)
RBC: 3.37 MIL/uL — ABNORMAL LOW (ref 4.22–5.81)
RDW: 17.8 % — ABNORMAL HIGH (ref 11.5–15.5)
WBC Count: 19.3 10*3/uL — ABNORMAL HIGH (ref 4.0–10.5)
nRBC: 0.2 % (ref 0.0–0.2)

## 2023-05-25 LAB — CMP (CANCER CENTER ONLY)
ALT: 8 U/L (ref 0–44)
AST: 13 U/L — ABNORMAL LOW (ref 15–41)
Albumin: 2.8 g/dL — ABNORMAL LOW (ref 3.5–5.0)
Alkaline Phosphatase: 140 U/L — ABNORMAL HIGH (ref 38–126)
Anion gap: 6 (ref 5–15)
BUN: 12 mg/dL (ref 8–23)
CO2: 32 mmol/L (ref 22–32)
Calcium: 9 mg/dL (ref 8.9–10.3)
Chloride: 102 mmol/L (ref 98–111)
Creatinine: 0.97 mg/dL (ref 0.61–1.24)
GFR, Estimated: >60 mL/min (ref >60)
Glucose, Bld: 123 mg/dL — ABNORMAL HIGH (ref 70–99)
Potassium: 3.7 mmol/L (ref 3.5–5.1)
Sodium: 140 mmol/L (ref 135–145)
Total Bilirubin: 0.4 mg/dL (ref <1.2)
Total Protein: 6.2 g/dL — ABNORMAL LOW (ref 6.5–8.1)

## 2023-05-25 LAB — SAMPLE TO BLOOD BANK

## 2023-05-25 MED ORDER — SODIUM CHLORIDE 0.9% FLUSH
10.0000 mL | Freq: Once | INTRAVENOUS | Status: AC
  Administered 2023-05-25: 10 mL

## 2023-05-25 MED ORDER — SODIUM CHLORIDE 0.9 % IV SOLN
900.0000 mg/m2 | Freq: Once | INTRAVENOUS | Status: AC
  Administered 2023-05-25: 1711 mg via INTRAVENOUS
  Filled 2023-05-25: qty 45

## 2023-05-25 MED ORDER — SODIUM CHLORIDE 0.9% FLUSH
10.0000 mL | INTRAVENOUS | Status: DC | PRN
  Administered 2023-05-25: 10 mL

## 2023-05-25 MED ORDER — SODIUM CHLORIDE 0.9 % IV SOLN
INTRAVENOUS | Status: DC
Start: 2023-05-25 — End: 2023-05-25

## 2023-05-25 MED ORDER — PROCHLORPERAZINE MALEATE 10 MG PO TABS
10.0000 mg | ORAL_TABLET | Freq: Once | ORAL | Status: AC
  Administered 2023-05-25: 10 mg via ORAL
  Filled 2023-05-25: qty 1

## 2023-05-25 MED ORDER — HEPARIN SOD (PORK) LOCK FLUSH 100 UNIT/ML IV SOLN
500.0000 [IU] | Freq: Once | INTRAVENOUS | Status: AC | PRN
  Administered 2023-05-25: 500 [IU]

## 2023-05-25 NOTE — Patient Instructions (Signed)
 [facility] - A DEPT OF [facility]  Discharge Instructions: Thank you for choosing [facility] to provide your oncology and hematology care.   If you have a lab appointment with the Cancer Center, please go directly to the Cancer Center and check in at the registration area.   Wear comfortable clothing and clothing appropriate for easy access to any Portacath or PICC line.   We strive to give you quality time with your provider. You may need to reschedule your appointment if you arrive late (15 or more minutes).  Arriving late affects you and other patients whose appointments are after yours.  Also, if you miss three or more appointments without notifying the office, you may be dismissed from the clinic at the provider's discretion.      For prescription refill requests, have your pharmacy contact our office and allow 72 hours for refills to be completed.    Today you received the following chemotherapy and/or immunotherapy agents Gemzar      To help prevent nausea and vomiting after your treatment, we encourage you to take your nausea medication as directed.  BELOW ARE SYMPTOMS THAT SHOULD BE REPORTED IMMEDIATELY: *FEVER GREATER THAN 100.4 F (38 C) OR HIGHER *CHILLS OR SWEATING *NAUSEA AND VOMITING THAT IS NOT CONTROLLED WITH YOUR NAUSEA MEDICATION *UNUSUAL SHORTNESS OF BREATH *UNUSUAL BRUISING OR BLEEDING *URINARY PROBLEMS (pain or burning when urinating, or frequent urination) *BOWEL PROBLEMS (unusual diarrhea, constipation, pain near the anus) TENDERNESS IN MOUTH AND THROAT WITH OR WITHOUT PRESENCE OF ULCERS (sore throat, sores in mouth, or a toothache) UNUSUAL RASH, SWELLING OR PAIN  UNUSUAL VAGINAL DISCHARGE OR ITCHING   Items with * indicate a potential emergency and should be followed up as soon as possible or go to the Emergency Department if any problems should occur.  Please show the CHEMOTHERAPY ALERT CARD or IMMUNOTHERAPY  ALERT CARD at check-in to the Emergency Department and triage nurse.  Should you have questions after your visit or need to cancel or reschedule your appointment, please contact [facility]  Dept: [phone]  and follow the prompts.  Office hours are 8:00 a.m. to 4:30 p.m. Monday - Friday. Please note that voicemails left after 4:00 p.m. may not be returned until the following business day.  We are closed weekends and major holidays. You have access to a nurse at all times for urgent questions. Please call the main number to the clinic Dept: [phone] and follow the prompts.   For any non-urgent questions, you may also contact your provider using MyChart. We now offer e-Visits for anyone 81 and older to request care online for non-urgent symptoms. For details visit [email or web address].   Also download the MyChart app! Go to the app store, search "MyChart", open the app, select [place], and log in with your MyChart username and password.

## 2023-05-25 NOTE — Progress Notes (Signed)
[facility] [phone]   [phone]  OFFICE PROGRESS NOTE  [name], MD [address] 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   PRIOR THERAPY: 1) status post resection and the patient has recurrence and October 2021 status post resection again under the care of Dr. Dorris Fetch. 2) Sunitinib 3 7.5 mg p.o. daily.  First dose started April 08, 2021.  Status post 3 months of treatment.  This treatment was discontinued secondary to disease progression. 3) Votrient (pazopanib) 800 mg p.o. daily.  Started August 10, 2021.  Status post 2 months of treatment discontinued secondary to disease progression. 4) status post redo right thoracotomy with resection of pleural tumors and lymph node dissection under the care of Dr. Dorris Fetch on Nov 13, 2021. 5) Systemic chemotherapy with doxorubicin 25 Mg/M2, ifosfamide 2500 Mg/M2 and mesna 2500 Mg/M2 days 1-3 every 3 weeks.  First dose February 12, 2022 at [facility] center.  Status post 2 cycles of treatment discontinued secondary to disease progression. 6) Radiation to the prostate cancer, under the care of Dr. Kathrynn Running. Last day of radiation on 12/17/22 7) Systemic chemotherapy with Temodar 200 Mg/M2 on days 1-7 and day 15-21 with a Avastin 5 Mg/KG on days 8 and 22 every 4 weeks.  Status post 11 cycles . Discontinued due to disease progression   CURRENT THERAPY: Palliative systemic chemotherapy with gemcitabine 900 mg/m on days 1 and 8 and docetaxel 75 mg/m on day 8 IV every 3 weeks.  He will receive Neulasta support on day 9.  He is status post 2 cycles.  INTERVAL HISTORY: Mathew [name] 62 y.o. male returns to the clinic today for follow-up visit accompanied by his wife.Discussed the use of  AI scribe software for clinical note transcription with the patient, who gave verbal consent to proceed.  History of Present Illness   The patient, a 62 year old individual with a history of solitary fibrous tumor of the lung diagnosed in 2017, has been on a regimen of Docetaxel and Gemcitabine for the past 9 weeks. The treatment involves receiving Gemcitabine on the first day, followed by a combination of Gemcitabine and Docetaxel on day 8 every three weeks.  In addition to lung cancer, the patient was diagnosed with prostate cancer in February 2024, which was treated with radiation. 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 The patient has been referred to a colorectal surgeon, but the earliest appointment is not until April.  Regarding the solitary fibrous tumor of the lung, the patient reports that his lung capacity has remained stable throughout the treatment. However, in the past three weeks, he has experienced shortness of breath and increased chest congestion, which he believes is separate from the tumor. There has been a slight chest pain on the right side, where the tumor is located.  The patient has experienced occasional nausea since starting chemotherapy, but it has not been severe. He has not experienced significant bone pain following the Neulasta injection after chemotherapy, which he manages with Claritin.  The patient also reports lethargy, which could be attributed to anemia and pain medication. He is currently receiving pain management from a doctor in [place], [place]. Despite these challenges, the patient's blood work appears satisfactory for  ongoing treatment.       MEDICAL HISTORY: Past Medical History:  Diagnosis Date   Anemia    Clubbing of nails    Deafness in left ear    Encounter for antineoplastic chemotherapy    History of kidney stones    Hypertension    Hypoglycemia due to neoplasm    Malignant  neoplasm prostate [facility]) 07/2022   urologist--- dr pace;   bx 01/ 2024, Gleason 4+3   Malignant solitary fibrous neoplasm (HCC) 11/2015   oncologist--- @ [facility] Dr R. 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  MEDICATIONS:  Current Outpatient Medications  Medication Sig Dispense Refill   dexamethasone (DECADRON) 4 MG tablet Take 2 tabs by mouth 2 times daily starting day before chemo. Then take 2 tabs daily for 2 days starting day after chemo. Take with food. 30 tablet 1   docusate sodium (COLACE) 100 MG capsule Take 1 capsule (100 mg total) by mouth every 12 (twelve) hours. (Patient taking differently: Take 100 mg by mouth daily.) 60 capsule 0   folic acid (FOLVITE) 400 MCG tablet Take 400 mcg by mouth daily.     furosemide (LASIX) 20 MG tablet Take 1 tablet (20 mg total) by mouth daily as needed. 10 tablet 0   gabapentin (NEURONTIN) 100 MG capsule Take 300 mg by mouth 2 (two) times daily. (Patient not taking: Reported on 03/26/2023)     gabapentin (NEURONTIN) 600 MG tablet Take 600 mg by mouth 2 (two) times daily.     Multiple Vitamin (MULTIVITAMIN) capsule Take 1 capsule by mouth daily.     ondansetron (ZOFRAN) 8 MG tablet Take 1 tablet (8 mg total) by mouth every 8 (eight) hours as needed for nausea or vomiting. 30 tablet 1   oxyCODONE (ROXICODONE) 15 MG immediate release tablet Take 1 tablet (15 mg total) by mouth every 6 (six) hours as needed for pain. 60 tablet 0   polyethylene glycol (MIRALAX / GLYCOLAX) 17 g packet Take 17 g by mouth 2  (two) times daily.     pregabalin (LYRICA) 100 MG capsule Take 100 mg by mouth 2 (two) times daily.     tamsulosin (FLOMAX) 0.4 MG CAPS capsule Take 0.4 mg by mouth at bedtime. Urology started 8/27     No current facility-administered medications for this visit.   Facility-Administered Medications Ordered in Other Visits  Medication Dose Route Frequency Provider Last Rate Last Admin   sodium phosphate (FLEET) 7-19 GM/118ML enema 1 enema  1 enema Rectal Once Noel Christmas, MD       sodium phosphate (FLEET) 7-19 GM/118ML enema 1 enema  1 enema Rectal Once Noel Christmas, MD        SURGICAL HISTORY:  Past Surgical History:  Procedure Laterality Date   GOLD SEED IMPLANT N/A 10/21/2022   Procedure: GOLD SEED IMPLANT;  Surgeon: Noel Christmas, MD;  Location: [facility];  Service: Urology;  Laterality: N/A;   INTERCOSTAL NERVE BLOCK Right 04/27/2020   Procedure: INTERCOSTAL NERVE BLOCK;  Surgeon: Loreli Slot, MD;  Location: [facility] OR;  Service: Thoracic;  Laterality: Right;   KNEE CARTILAGE SURGERY Left 1976   RESECTION OF MEDIASTINAL MASS Right 01/23/2016   @[facility]  by dr Dorris Fetch;   resection chest wall mass w/ en bloc wedge resection of RLL   RESECTION OF MEDIASTINAL MASS Right 11/13/2021   Procedure: RESECTION OF PLEURAL TUMORS;  Surgeon: Loreli Slot, MD;  Location: [facility] OR;  Service: Thoracic;  Laterality: Right;   SPACE OAR INSTILLATION N/A 10/21/2022   Procedure: SPACE OAR INSTILLATION;  Surgeon: Noel Christmas, MD;  Location: [facility];  Service: Urology;  Laterality: N/A;   THORACOTOMY Right 04/27/2020   Procedure: THORACOTOMY;  Surgeon: Loreli Slot, MD;  Location: [facility] OR;  Service: Thoracic;  Laterality: Right;   THORACOTOMY Right 11/13/2021   Procedure: REDO THORACOTOMY;  Surgeon: Loreli Slot, MD;  Location: [facility] OR;  Service: Thoracic;  Laterality: Right;   TONSILLECTOMY     child   VIDEO BRONCHOSCOPY WITH  ENDOBRONCHIAL ULTRASOUND N/A 12/17/2015   Procedure: VIDEO BRONCHOSCOPY WITH ENDOBRONCHIAL ULTRASOUND;  Surgeon: Loreli Slot, MD;  Location: [facility] OR;  Service: Thoracic;  Laterality: N/A;    REVIEW OF SYSTEMS:  Constitutional: positive for anorexia and fatigue Eyes: negative Ears, nose, mouth, throat, and face: negative Respiratory: positive for dyspnea on exertion and pleurisy/chest pain Cardiovascular: negative Gastrointestinal: negative Genitourinary:negative Integument/breast: negative Hematologic/lymphatic: negative Musculoskeletal:negative Neurological: negative Behavioral/Psych: negative Endocrine: negative Allergic/Immunologic: negative   PHYSICAL EXAMINATION: General appearance: alert, cooperative, fatigued, and no distress Head: Normocephalic, without obvious abnormality, atraumatic Neck: no adenopathy, no JVD, supple, symmetrical, trachea midline, and thyroid not enlarged, symmetric, no tenderness/mass/nodules Lymph nodes: Cervical, supraclavicular, and axillary nodes normal. Resp: diminished breath sounds RLL and dullness to percussion RLL Back: symmetric, no curvature. ROM normal. No CVA tenderness. Cardio: regular rate and rhythm, S1, S2 normal, no murmur, click, rub or gallop GI: soft, non-tender; bowel sounds normal; no masses,  no organomegaly Extremities: extremities normal, atraumatic, no cyanosis or edema Neurologic: Grossly normal  ECOG PERFORMANCE STATUS: 1 - Symptomatic but completely ambulatory  Blood pressure 110/73, pulse (!) 113, temperature 98.7 F (37.1 C), temperature source Temporal, resp. rate 18, weight 159 lb 4.8 oz (72.3 kg), SpO2 100%.  LABORATORY DATA: Lab Results  Component Value Date   WBC 19.3 (H) 05/25/2023   HGB 9.4 (L) 05/25/2023   HCT 30.6 (L) 05/25/2023   MCV 90.8 05/25/2023   PLT 409 (H) 05/25/2023      Chemistry      Component Value Date/Time   NA 140 05/25/2023 1111   K 3.7 05/25/2023 1111   CL 102 05/25/2023  1111   CO2 32 05/25/2023 1111   BUN 12 05/25/2023 1111   CREATININE 0.97 05/25/2023 1111      Component Value Date/Time   CALCIUM 9.0 05/25/2023 1111   ALKPHOS 140 (H) 05/25/2023 1111   AST 13 (L) 05/25/2023 1111   ALT 8 05/25/2023 1111   BILITOT 0.4 05/25/2023 1111       RADIOGRAPHIC STUDIES: No results found.  ASSESSMENT AND PLAN:  This is a very pleasant 62 years old male with recurrent solitary fibrous tumor involving the right side of the chest initially diagnosed in June 2017 status post several treatment including several treatment with sunitinib, Votrient as well as systemic chemotherapy with doxorubicin, ifosfamide and mesna and most recently  treated with Temodar and Avastin discontinued after 11 cycles secondary to disease progression.  The patient also has a history of prostate adenocarcinoma status post radiotherapy under the care of Dr. Kathrynn Running. He had repeat imaging studies recently that showed clear evidence for disease progression.  The patient has a discussion with Dr. Waymon Amato the sarcoma expert at [facility] who recommended for him treatment locally with a combination of gemcitabine 900 Mg/M2 on days 1 and 8 in addition to docetaxel 75 Mg/M2 on day 8 with Neulasta support.  He is currently undergoing this treatment status post 2 cycles and tolerating it fairly well except for the fatigue from the chemotherapy-induced anemia.    Metastatic solitary fibrous tumor of the Lung Metastatic solitary fibrous tumor of the lung diagnosed in 2017, currently on docetaxel and gemcitabine for three years. Reports recent shortness of breath and chest congestion over the last three weeks. Mild right-sided chest pain likely tumor-related. Hemoglobin over 9, no transfusion needed. No significant nausea or vomiting. Bone pain from Neulasta managed with Claritin. Scan in two weeks to assess treatment efficacy. - Order chest CT scan in two weeks - Schedule follow-up appointment in three  weeks  Anemia Lethargy likely due to anemia and pain medications. Hemoglobin levels over 9, no immediate transfusion needed. 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 Daily pain from the fistula. Referred to a colorectal surgeon at [facility] with an appointment in April. Previous consultation with Dr. Cliffton Asters, who was not comfortable performing the surgery. - Continue pain management with current provider in [place] - Follow up with colorectal surgeon at [facility] in April.   The patient was advised to call immediately if he has any other concerning symptoms in the interval. The patient voices understanding of current disease status and treatment options and is in agreement with the current care plan.  All questions were answered. The patient knows to call the clinic with any problems, questions or concerns. We can certainly see the patient much sooner if necessary.  The total time spent in the appointment was 30 minutes.  Disclaimer: This note was dictated with voice recognition software. Similar sounding words can inadvertently be transcribed and may not be corrected upon review.

## 2023-05-28 ENCOUNTER — Encounter: Payer: Self-pay | Admitting: Internal Medicine

## 2023-06-02 ENCOUNTER — Inpatient Hospital Stay: Payer: BC Managed Care – PPO

## 2023-06-02 ENCOUNTER — Encounter: Payer: Self-pay | Admitting: Internal Medicine

## 2023-06-02 ENCOUNTER — Other Ambulatory Visit: Payer: BC Managed Care – PPO

## 2023-06-02 ENCOUNTER — Other Ambulatory Visit: Payer: Self-pay | Admitting: Physician Assistant

## 2023-06-02 ENCOUNTER — Ambulatory Visit: Payer: BC Managed Care – PPO

## 2023-06-02 VITALS — BP 102/67 | HR 97 | Temp 98.4°F | Resp 16 | Wt 159.5 lb

## 2023-06-02 DIAGNOSIS — Z95828 Presence of other vascular implants and grafts: Secondary | ICD-10-CM

## 2023-06-02 DIAGNOSIS — D492 Neoplasm of unspecified behavior of bone, soft tissue, and skin: Secondary | ICD-10-CM

## 2023-06-02 DIAGNOSIS — R609 Edema, unspecified: Secondary | ICD-10-CM

## 2023-06-02 DIAGNOSIS — G893 Neoplasm related pain (acute) (chronic): Secondary | ICD-10-CM | POA: Diagnosis not present

## 2023-06-02 DIAGNOSIS — T451X5A Adverse effect of antineoplastic and immunosuppressive drugs, initial encounter: Secondary | ICD-10-CM

## 2023-06-02 LAB — CBC WITH DIFFERENTIAL (CANCER CENTER ONLY)
Abs Immature Granulocytes: 0.29 10*3/uL — ABNORMAL HIGH (ref 0.00–0.07)
Basophils Absolute: 0 10*3/uL (ref 0.0–0.1)
Basophils Relative: 0 %
Eosinophils Absolute: 0 10*3/uL (ref 0.0–0.5)
Eosinophils Relative: 0 %
HCT: 27.4 % — ABNORMAL LOW (ref 39.0–52.0)
Hemoglobin: 8.3 g/dL — ABNORMAL LOW (ref 13.0–17.0)
Immature Granulocytes: 2 %
Lymphocytes Relative: 3 %
Lymphs Abs: 0.5 10*3/uL — ABNORMAL LOW (ref 0.7–4.0)
MCH: 27.2 pg (ref 26.0–34.0)
MCHC: 30.3 g/dL (ref 30.0–36.0)
MCV: 89.8 fL (ref 80.0–100.0)
Monocytes Absolute: 2 10*3/uL — ABNORMAL HIGH (ref 0.1–1.0)
Monocytes Relative: 10 %
Neutro Abs: 16.6 10*3/uL — ABNORMAL HIGH (ref 1.7–7.7)
Neutrophils Relative %: 85 %
Platelet Count: 324 10*3/uL (ref 150–400)
RBC: 3.05 MIL/uL — ABNORMAL LOW (ref 4.22–5.81)
RDW: 18 % — ABNORMAL HIGH (ref 11.5–15.5)
WBC Count: 19.5 10*3/uL — ABNORMAL HIGH (ref 4.0–10.5)
nRBC: 0.1 % (ref 0.0–0.2)

## 2023-06-02 LAB — CMP (CANCER CENTER ONLY)
ALT: 8 U/L (ref 0–44)
AST: 15 U/L (ref 15–41)
Albumin: 3 g/dL — ABNORMAL LOW (ref 3.5–5.0)
Alkaline Phosphatase: 173 U/L — ABNORMAL HIGH (ref 38–126)
Anion gap: 8 (ref 5–15)
BUN: 20 mg/dL (ref 8–23)
CO2: 31 mmol/L (ref 22–32)
Calcium: 9.7 mg/dL (ref 8.9–10.3)
Chloride: 102 mmol/L (ref 98–111)
Creatinine: 0.74 mg/dL (ref 0.61–1.24)
GFR, Estimated: >60 mL/min (ref >60)
Glucose, Bld: 107 mg/dL — ABNORMAL HIGH (ref 70–99)
Potassium: 3.8 mmol/L (ref 3.5–5.1)
Sodium: 141 mmol/L (ref 135–145)
Total Bilirubin: 0.4 mg/dL (ref <1.2)
Total Protein: 6.1 g/dL — ABNORMAL LOW (ref 6.5–8.1)

## 2023-06-02 LAB — SAMPLE TO BLOOD BANK

## 2023-06-02 MED ORDER — DEXAMETHASONE SODIUM PHOSPHATE 10 MG/ML IJ SOLN
10.0000 mg | Freq: Once | INTRAMUSCULAR | Status: AC
  Administered 2023-06-02: 10 mg via INTRAVENOUS
  Filled 2023-06-02: qty 1

## 2023-06-02 MED ORDER — GEMCITABINE HCL CHEMO INJECTION 1 GM/26.3ML
900.0000 mg/m2 | Freq: Once | INTRAVENOUS | Status: AC
  Administered 2023-06-02: 1711 mg via INTRAVENOUS
  Filled 2023-06-02: qty 45

## 2023-06-02 MED ORDER — SODIUM CHLORIDE 0.9% FLUSH
10.0000 mL | INTRAVENOUS | Status: DC | PRN
  Administered 2023-06-02: 10 mL

## 2023-06-02 MED ORDER — SODIUM CHLORIDE 0.9 % IV SOLN: INTRAVENOUS | Status: DC

## 2023-06-02 MED ORDER — SODIUM CHLORIDE 0.9 % IV SOLN
75.0000 mg/m2 | Freq: Once | INTRAVENOUS | Status: AC
  Administered 2023-06-02: 143 mg via INTRAVENOUS
  Filled 2023-06-02: qty 14.3

## 2023-06-02 MED ORDER — SODIUM CHLORIDE 0.9% FLUSH
10.0000 mL | Freq: Once | INTRAVENOUS | Status: AC
  Administered 2023-06-02: 10 mL

## 2023-06-02 MED ORDER — HEPARIN SOD (PORK) LOCK FLUSH 100 UNIT/ML IV SOLN
500.0000 [IU] | Freq: Once | INTRAVENOUS | Status: AC | PRN
  Administered 2023-06-02: 500 [IU]

## 2023-06-02 NOTE — Patient Instructions (Signed)
[facility]  Discharge Instructions: Thank you for choosing [facility] to provide your oncology and hematology care.   If you have a lab appointment with the Cancer Center, please go directly to the Cancer Center and check in at the registration area.   Wear comfortable clothing and clothing appropriate for easy access to any Portacath or PICC line.   We strive to give you quality time with your provider. You may need to reschedule your appointment if you arrive late (15 or more minutes).  Arriving late affects you and other patients whose appointments are after yours.  Also, if you miss three or more appointments without notifying the office, you may be dismissed from the clinic at the provider's discretion.      For prescription refill requests, have your pharmacy contact our office and allow 72 hours for refills to be completed.    Today you received the following chemotherapy and/or immunotherapy agents: Gemzar/Docetaxel    To help prevent nausea and vomiting after your treatment, we encourage you to take your nausea medication as directed.  BELOW ARE SYMPTOMS THAT SHOULD BE REPORTED IMMEDIATELY: *FEVER GREATER THAN 100.4 F (38 C) OR HIGHER *CHILLS OR SWEATING *NAUSEA AND VOMITING THAT IS NOT CONTROLLED WITH YOUR NAUSEA MEDICATION *UNUSUAL SHORTNESS OF BREATH *UNUSUAL BRUISING OR BLEEDING *URINARY PROBLEMS (pain or burning when urinating, or frequent urination) *BOWEL PROBLEMS (unusual diarrhea, constipation, pain near the anus) TENDERNESS IN MOUTH AND THROAT WITH OR WITHOUT PRESENCE OF ULCERS (sore throat, sores in mouth, or a toothache) UNUSUAL RASH, SWELLING OR PAIN  UNUSUAL VAGINAL DISCHARGE OR ITCHING   Items with * indicate a potential emergency and should be followed up as soon as possible or go to the Emergency Department if any problems should occur.  Please show the CHEMOTHERAPY ALERT CARD or  IMMUNOTHERAPY ALERT CARD at check-in to the Emergency Department and triage nurse.  Should you have questions after your visit or need to cancel or reschedule your appointment, please contact [facility]  Dept: [phone]  and follow the prompts.  Office hours are 8:00 a.m. to 4:30 p.m. Monday - Friday. Please note that voicemails left after 4:00 p.m. may not be returned until the following business day.  We are closed weekends and major holidays. You have access to a nurse at all times for urgent questions. Please call the main number to the clinic Dept: [phone] and follow the prompts.   For any non-urgent questions, you may also contact your provider using MyChart. We now offer e-Visits for anyone 17 and older to request care online for non-urgent symptoms. For details visit [email or web address].   Also download the MyChart app! Go to the app store, search "MyChart", open the app, select [place], and log in with your MyChart username and password.

## 2023-06-03 ENCOUNTER — Encounter: Payer: Self-pay | Admitting: Internal Medicine

## 2023-06-03 ENCOUNTER — Encounter (HOSPITAL_COMMUNITY): Payer: Self-pay

## 2023-06-03 ENCOUNTER — Other Ambulatory Visit: Payer: Self-pay

## 2023-06-03 ENCOUNTER — Inpatient Hospital Stay (HOSPITAL_COMMUNITY): Payer: BC Managed Care – PPO

## 2023-06-03 ENCOUNTER — Inpatient Hospital Stay (HOSPITAL_COMMUNITY)
Admission: EM | Admit: 2023-06-03 | Discharge: 2023-06-05 | DRG: 947 | Disposition: A | Discharge status: Hospice | Payer: BC Managed Care – PPO | Attending: Internal Medicine | Admitting: Internal Medicine

## 2023-06-03 ENCOUNTER — Telehealth: Payer: Self-pay | Admitting: Medical Oncology

## 2023-06-03 ENCOUNTER — Emergency Department (HOSPITAL_COMMUNITY): Payer: BC Managed Care – PPO

## 2023-06-03 DIAGNOSIS — J9601 Acute respiratory failure with hypoxia: Secondary | ICD-10-CM | POA: Diagnosis present

## 2023-06-03 DIAGNOSIS — Z79899 Other long term (current) drug therapy: Secondary | ICD-10-CM

## 2023-06-03 DIAGNOSIS — S22008A Other fracture of unspecified thoracic vertebra, initial encounter for closed fracture: Secondary | ICD-10-CM

## 2023-06-03 DIAGNOSIS — Z66 Do not resuscitate: Secondary | ICD-10-CM | POA: Diagnosis present

## 2023-06-03 DIAGNOSIS — Z515 Encounter for palliative care: Secondary | ICD-10-CM

## 2023-06-03 DIAGNOSIS — R180 Malignant ascites: Secondary | ICD-10-CM | POA: Diagnosis present

## 2023-06-03 DIAGNOSIS — I959 Hypotension, unspecified: Secondary | ICD-10-CM | POA: Diagnosis not present

## 2023-06-03 DIAGNOSIS — I10 Essential (primary) hypertension: Secondary | ICD-10-CM | POA: Diagnosis present

## 2023-06-03 DIAGNOSIS — Z880 Allergy status to penicillin: Secondary | ICD-10-CM

## 2023-06-03 DIAGNOSIS — D649 Anemia, unspecified: Secondary | ICD-10-CM | POA: Diagnosis not present

## 2023-06-03 DIAGNOSIS — G893 Neoplasm related pain (acute) (chronic): Principal | ICD-10-CM | POA: Diagnosis present

## 2023-06-03 DIAGNOSIS — K605 Anorectal fistula, unspecified: Secondary | ICD-10-CM | POA: Diagnosis present

## 2023-06-03 DIAGNOSIS — J942 Hemothorax: Secondary | ICD-10-CM | POA: Diagnosis present

## 2023-06-03 DIAGNOSIS — C3491 Malignant neoplasm of unspecified part of right bronchus or lung: Secondary | ICD-10-CM

## 2023-06-03 DIAGNOSIS — I952 Hypotension due to drugs: Secondary | ICD-10-CM | POA: Diagnosis not present

## 2023-06-03 DIAGNOSIS — J910 Malignant pleural effusion: Secondary | ICD-10-CM | POA: Diagnosis present

## 2023-06-03 DIAGNOSIS — M8448XA Pathological fracture, other site, initial encounter for fracture: Secondary | ICD-10-CM | POA: Diagnosis present

## 2023-06-03 DIAGNOSIS — R140 Abdominal distension (gaseous): Secondary | ICD-10-CM | POA: Diagnosis present

## 2023-06-03 DIAGNOSIS — C782 Secondary malignant neoplasm of pleura: Secondary | ICD-10-CM | POA: Diagnosis present

## 2023-06-03 DIAGNOSIS — R601 Generalized edema: Secondary | ICD-10-CM | POA: Diagnosis not present

## 2023-06-03 DIAGNOSIS — H9192 Unspecified hearing loss, left ear: Secondary | ICD-10-CM | POA: Diagnosis present

## 2023-06-03 DIAGNOSIS — C781 Secondary malignant neoplasm of mediastinum: Secondary | ICD-10-CM | POA: Diagnosis present

## 2023-06-03 DIAGNOSIS — Z8546 Personal history of malignant neoplasm of prostate: Secondary | ICD-10-CM

## 2023-06-03 DIAGNOSIS — Z8249 Family history of ischemic heart disease and other diseases of the circulatory system: Secondary | ICD-10-CM | POA: Diagnosis not present

## 2023-06-03 DIAGNOSIS — C499 Malignant neoplasm of connective and soft tissue, unspecified: Secondary | ICD-10-CM | POA: Diagnosis present

## 2023-06-03 DIAGNOSIS — Z7189 Other specified counseling: Secondary | ICD-10-CM

## 2023-06-03 DIAGNOSIS — T451X5A Adverse effect of antineoplastic and immunosuppressive drugs, initial encounter: Secondary | ICD-10-CM | POA: Diagnosis present

## 2023-06-03 DIAGNOSIS — C787 Secondary malignant neoplasm of liver and intrahepatic bile duct: Secondary | ICD-10-CM | POA: Diagnosis present

## 2023-06-03 DIAGNOSIS — Z833 Family history of diabetes mellitus: Secondary | ICD-10-CM

## 2023-06-03 DIAGNOSIS — E8770 Fluid overload, unspecified: Secondary | ICD-10-CM | POA: Diagnosis present

## 2023-06-03 DIAGNOSIS — C61 Malignant neoplasm of prostate: Secondary | ICD-10-CM | POA: Diagnosis not present

## 2023-06-03 DIAGNOSIS — Z886 Allergy status to analgesic agent status: Secondary | ICD-10-CM

## 2023-06-03 DIAGNOSIS — J9811 Atelectasis: Secondary | ICD-10-CM | POA: Diagnosis present

## 2023-06-03 DIAGNOSIS — D630 Anemia in neoplastic disease: Secondary | ICD-10-CM | POA: Diagnosis present

## 2023-06-03 DIAGNOSIS — Z923 Personal history of irradiation: Secondary | ICD-10-CM | POA: Diagnosis not present

## 2023-06-03 DIAGNOSIS — K6289 Other specified diseases of anus and rectum: Secondary | ICD-10-CM | POA: Diagnosis present

## 2023-06-03 DIAGNOSIS — C7951 Secondary malignant neoplasm of bone: Secondary | ICD-10-CM | POA: Diagnosis present

## 2023-06-03 DIAGNOSIS — Z87891 Personal history of nicotine dependence: Secondary | ICD-10-CM

## 2023-06-03 DIAGNOSIS — C786 Secondary malignant neoplasm of retroperitoneum and peritoneum: Secondary | ICD-10-CM | POA: Diagnosis present

## 2023-06-03 DIAGNOSIS — R1084 Generalized abdominal pain: Principal | ICD-10-CM

## 2023-06-03 DIAGNOSIS — Z87442 Personal history of urinary calculi: Secondary | ICD-10-CM

## 2023-06-03 DIAGNOSIS — Z974 Presence of external hearing-aid: Secondary | ICD-10-CM

## 2023-06-03 LAB — COMPREHENSIVE METABOLIC PANEL
ALT: 13 U/L (ref 0–44)
AST: 25 U/L (ref 15–41)
Albumin: 2.6 g/dL — ABNORMAL LOW (ref 3.5–5.0)
Alkaline Phosphatase: 173 U/L — ABNORMAL HIGH (ref 38–126)
Anion gap: 11 (ref 5–15)
BUN: 19 mg/dL (ref 8–23)
CO2: 28 mmol/L (ref 22–32)
Calcium: 9.2 mg/dL (ref 8.9–10.3)
Chloride: 99 mmol/L (ref 98–111)
Creatinine, Ser: 0.78 mg/dL (ref 0.61–1.24)
GFR, Estimated: >60 mL/min (ref >60)
Glucose, Bld: 118 mg/dL — ABNORMAL HIGH (ref 70–99)
Potassium: 3.8 mmol/L (ref 3.5–5.1)
Sodium: 138 mmol/L (ref 135–145)
Total Bilirubin: 0.6 mg/dL (ref <1.2)
Total Protein: 6.8 g/dL (ref 6.5–8.1)

## 2023-06-03 LAB — RETICULOCYTES
Immature Retic Fract: 23 % — ABNORMAL HIGH (ref 2.3–15.9)
RBC.: 3.64 MIL/uL — ABNORMAL LOW (ref 4.22–5.81)
Retic Count, Absolute: 100.1 10*3/uL (ref 19.0–186.0)
Retic Ct Pct: 2.8 % (ref 0.4–3.1)

## 2023-06-03 LAB — CBC WITH DIFFERENTIAL/PLATELET
Abs Immature Granulocytes: 0.14 10*3/uL — ABNORMAL HIGH (ref 0.00–0.07)
Basophils Absolute: 0 10*3/uL (ref 0.0–0.1)
Basophils Relative: 0 %
Eosinophils Absolute: 0 10*3/uL (ref 0.0–0.5)
Eosinophils Relative: 0 %
HCT: 33.3 % — ABNORMAL LOW (ref 39.0–52.0)
Hemoglobin: 10.1 g/dL — ABNORMAL LOW (ref 13.0–17.0)
Immature Granulocytes: 1 %
Lymphocytes Relative: 2 %
Lymphs Abs: 0.2 10*3/uL — ABNORMAL LOW (ref 0.7–4.0)
MCH: 27.7 pg (ref 26.0–34.0)
MCHC: 30.3 g/dL (ref 30.0–36.0)
MCV: 91.2 fL (ref 80.0–100.0)
Monocytes Absolute: 0.5 10*3/uL (ref 0.1–1.0)
Monocytes Relative: 5 %
Neutro Abs: 9.1 10*3/uL — ABNORMAL HIGH (ref 1.7–7.7)
Neutrophils Relative %: 92 %
Platelets: 250 10*3/uL (ref 150–400)
RBC: 3.65 MIL/uL — ABNORMAL LOW (ref 4.22–5.81)
RDW: 18.4 % — ABNORMAL HIGH (ref 11.5–15.5)
WBC: 9.9 10*3/uL (ref 4.0–10.5)
nRBC: 0.2 % (ref 0.0–0.2)

## 2023-06-03 LAB — PHOSPHORUS: Phosphorus: 3.6 mg/dL (ref 2.5–4.6)

## 2023-06-03 LAB — LACTIC ACID, PLASMA
Lactic Acid, Venous: 1.9 mmol/L (ref 0.5–1.9)
Lactic Acid, Venous: 1.7 mmol/L (ref 0.5–1.9)

## 2023-06-03 LAB — MAGNESIUM: Magnesium: 2.1 mg/dL (ref 1.7–2.4)

## 2023-06-03 LAB — LIPASE, BLOOD: Lipase: 23 U/L (ref 11–51)

## 2023-06-03 LAB — CK: Total CK: 34 U/L — ABNORMAL LOW (ref 49–397)

## 2023-06-03 MED ORDER — MIDODRINE HCL 5 MG PO TABS: 5.0000 mg | ORAL_TABLET | Freq: Three times a day (TID) | ORAL | Status: DC

## 2023-06-03 MED ORDER — MIDODRINE HCL 5 MG PO TABS
5.0000 mg | ORAL_TABLET | Freq: Three times a day (TID) | ORAL | Status: DC
  Administered 2023-06-03 – 2023-06-05 (×6): 5 mg via ORAL
  Filled 2023-06-03 (×6): qty 1

## 2023-06-03 MED ORDER — IOHEXOL 300 MG/ML  SOLN
100.0000 mL | Freq: Once | INTRAMUSCULAR | Status: AC | PRN
  Administered 2023-06-03: 100 mL via INTRAVENOUS

## 2023-06-03 MED ORDER — HYDROMORPHONE HCL 1 MG/ML IJ SOLN
1.0000 mg | Freq: Once | INTRAMUSCULAR | Status: AC
Start: 2023-06-03 — End: 2023-06-03
  Administered 2023-06-03: 1 mg via INTRAVENOUS
  Filled 2023-06-03: qty 1

## 2023-06-03 MED ORDER — SODIUM CHLORIDE 0.9 % IV BOLUS
500.0000 mL | Freq: Once | INTRAVENOUS | Status: AC
  Administered 2023-06-03: 500 mL via INTRAVENOUS

## 2023-06-03 MED ORDER — HYDROMORPHONE HCL 1 MG/ML IJ SOLN: 1.0000 mg | Freq: Once | INTRAMUSCULAR | Status: DC

## 2023-06-03 MED ORDER — SODIUM CHLORIDE 0.9 % IV BOLUS
1000.0000 mL | Freq: Once | INTRAVENOUS | Status: AC
Start: 2023-06-03 — End: 2023-06-03
  Administered 2023-06-03: 1000 mL via INTRAVENOUS

## 2023-06-03 MED ORDER — ONDANSETRON HCL 4 MG/2ML IJ SOLN
4.0000 mg | Freq: Once | INTRAMUSCULAR | Status: AC
  Administered 2023-06-03: 4 mg via INTRAVENOUS
  Filled 2023-06-03: qty 2

## 2023-06-03 NOTE — Assessment & Plan Note (Signed)
Transiently O2 sats 88 on room air started on 2 L patient  received pain medications Obtain chest x-ray to evaluate for any evidence of fluid overload Avoid over aggressive fluid resuscitation Patient and family would like to avoid over aggressive testing at this time

## 2023-06-03 NOTE — ED Provider Notes (Signed)
[place] EMERGENCY DEPARTMENT AT [facility] Provider Note   CSN: [id number] Arrival date & time: 06/03/23  1524     History  Chief Complaint  Patient presents with   Abdominal Pain   Vomiting    TIA Mathew [name] is a 62 y.o. male, hx of prostate cancer, solitary fibrous tumor of right lung, who presents to the ED 2/2 to nausea, vomiting, severe abdominal pain, has been going on for the last 24 hours.  He states he developed it yesterday after having chemotherapy.  He states this is his second dose of this type of chemotherapy, and he tolerated the first initially.  His oncologist is Dr. Shirline Frees, and his chemotherapy regimen is gemcitabine and docetaxel as well as neulasta support.  He denies any kind of diarrhea, constipation, fever, chills.  No history of intra-abdominal surgeries. Denies SOB or chest pain.   Home Medications Prior to Admission medications   Medication Sig Start Date End Date Taking? Authorizing Provider  dexamethasone (DECADRON) 4 MG tablet Take 2 tabs by mouth 2 times daily starting day before chemo. Then take 2 tabs daily for 2 days starting day after chemo. Take with food. 04/13/23   Si Gaul, MD  docusate sodium (COLACE) 100 MG capsule Take 1 capsule (100 mg total) by mouth every 12 (twelve) hours. Patient taking differently: Take 100 mg by mouth daily. 01/18/23   Eleonore Chiquito, FNP  folic acid (FOLVITE) 400 MCG tablet Take 400 mcg by mouth daily.    [provider]  furosemide (LASIX) 20 MG tablet TAKE 1 TABLET BY MOUTH EVERY DAY AS NEEDED 06/02/23   Heilingoetter, Cassandra L, PA-C  gabapentin (NEURONTIN) 100 MG capsule Take 300 mg by mouth 2 (two) times daily. Patient not taking: Reported on 03/26/2023    [provider]  gabapentin (NEURONTIN) 600 MG tablet Take 600 mg by mouth 2 (two) times daily.    [provider]  Multiple Vitamin (MULTIVITAMIN) capsule Take 1 capsule by mouth daily.    [provider]  ondansetron (ZOFRAN) 8 MG tablet Take 1 tablet (8 mg total) by mouth every 8 (eight) hours as needed for nausea or vomiting. 04/13/23   Si Gaul, MD  oxyCODONE (ROXICODONE) 15 MG immediate release tablet Take 1 tablet (15 mg total) by mouth every 6 (six) hours as needed for pain. 03/19/23   Bruning, Ashlyn, PA-C  polyethylene glycol (MIRALAX / GLYCOLAX) 17 g packet Take 17 g by mouth 2 (two) times daily. 03/09/23   Joseph Art, DO  pregabalin (LYRICA) 100 MG capsule Take 100 mg by mouth 2 (two) times daily. 03/20/23   [provider]  tamsulosin (FLOMAX) 0.4 MG CAPS capsule Take 0.4 mg by mouth at bedtime. Urology started 8/27    [provider]      Allergies    Aspirin and Penicillins    Review of Systems   Review of Systems  Respiratory:  Negative for shortness of breath.   Cardiovascular:  Negative for chest pain.  Gastrointestinal:  Positive for abdominal pain, nausea and vomiting.    Physical Exam Updated Vital Signs BP (!) 102/52   Pulse (!) 101   Temp 98.2 F (36.8 C) (Oral)   Resp 15   Ht 5\' 9"  (1.753 m)   SpO2 92%   BMI 23.55 kg/m  Physical Exam Vitals and nursing note reviewed.  Constitutional:      General: He is not in acute distress.    Appearance: He is  well-developed.  HENT:     Head: Normocephalic and atraumatic.  Eyes:     Conjunctiva/sclera: Conjunctivae normal.  Cardiovascular:     Rate and Rhythm: Normal rate and regular rhythm.     Heart sounds: No murmur heard. Pulmonary:     Effort: Pulmonary effort is normal. No respiratory distress.     Breath sounds: Normal breath sounds.  Abdominal:     Palpations: Abdomen is soft.     Tenderness: There is generalized abdominal tenderness. There is guarding.  Musculoskeletal:        General: No swelling.     Cervical back: Neck supple.  Skin:    General: Skin is warm and dry.     Capillary Refill: Capillary refill takes less than 2 seconds.  Neurological:      Mental Status: He is alert.  Psychiatric:        Mood and Affect: Mood normal.     ED Results / Procedures / Treatments   Labs (all labs ordered are listed, but only abnormal results are displayed) Labs Reviewed  CBC WITH DIFFERENTIAL/PLATELET - Abnormal; Notable for the following components:      Result Value   RBC 3.65 (*)    Hemoglobin 10.1 (*)    HCT 33.3 (*)    RDW 18.4 (*)    Neutro Abs 9.1 (*)    Lymphs Abs 0.2 (*)    Abs Immature Granulocytes 0.14 (*)    All other components within normal limits  COMPREHENSIVE METABOLIC PANEL - Abnormal; Notable for the following components:   Glucose, Bld 118 (*)    Albumin 2.6 (*)    Alkaline Phosphatase 173 (*)    All other components within normal limits  CK - Abnormal; Notable for the following components:   Total CK 34 (*)    All other components within normal limits  RETICULOCYTES - Abnormal; Notable for the following components:   RBC. 3.64 (*)    Immature Retic Fract 23.0 (*)    All other components within normal limits  LIPASE, BLOOD  LACTIC ACID, PLASMA  LACTIC ACID, PLASMA  MAGNESIUM  PHOSPHORUS  URINALYSIS, COMPLETE (UACMP) WITH MICROSCOPIC  VITAMIN B12  FOLATE  IRON AND TIBC  FERRITIN    EKG None  Radiology DG CHEST PORT 1 VIEW Result Date: 06/03/2023 CLINICAL DATA:  Hypoxia EXAM: PORTABLE CHEST 1 VIEW COMPARISON:  Chest x-ray 02/23/2022.  CT chest 03/20/2023. FINDINGS: There is new right lateral pleural thickening or loculated fluid. Right basilar opacities appear similar to prior. The cardiomediastinal silhouette is stable. Left lung is clear. There is no pneumothorax. Right chest port catheter tip ends in the distal SVC. No acute fractures are identified. IMPRESSION: 1. New right lateral pleural thickening or loculated fluid. 2. Right basilar opacities appear similar to prior. Electronically Signed   By: Darliss Cheney M.D.   On: 06/03/2023 22:57   CT ABDOMEN PELVIS W CONTRAST Result Date:  06/03/2023 CLINICAL DATA:  Acute abdominal pain. Undergoing chemotherapy. Prostate cancer. EXAM: CT ABDOMEN AND PELVIS WITH CONTRAST TECHNIQUE: Multidetector CT imaging of the abdomen and pelvis was performed using the standard protocol following bolus administration of intravenous contrast. RADIATION DOSE REDUCTION: This exam was performed according to the departmental dose-optimization program which includes automated exposure control, adjustment of the mA and/or kV according to patient size and/or use of iterative reconstruction technique. CONTRAST:  OMNIPAQUE IOHEXOL 300 MG/ML  SOLN COMPARISON:  CT chest abdomen and pelvis 03/20/2023 FINDINGS: Lower chest: Multiple soft tissue tumors  occupying the lower right hemithorax extending through the diaphragm. Largest measures 8.2 x 11.9 cm image 2/16. This is not significantly changed. There is soft tissue mass in the lower mediastinum the right of the esophagus at the level of the diaphragm measuring 7.9 x 11.6 cm which has significantly increased in size (previously 3.8 by 4.4 cm). Hepatobiliary: There is a stable left hepatic mass measuring 2.9 cm. No definite new hepatic masses are identified. No biliary ductal dilatation. The gallbladder is within normal limits. Pancreas: Unremarkable. No pancreatic ductal dilatation or surrounding inflammatory changes. Spleen: Normal in size without focal abnormality. Adrenals/Urinary Tract: There are 2 cysts in the right kidney measuring up to 2.6 cm. There are punctate nonobstructing left renal calculi. Cortical hypodensities in the left kidney are too Kahlen Morais to characterize, also likely cysts. No hydronephrosis identified. The right adrenal gland is not well defined. The left adrenal gland is within normal limits. Bladder is within normal limits. Stomach/Bowel: Stomach is within normal limits. Appendix appears normal. No evidence of bowel wall thickening, distention, or inflammatory changes. Vascular/Lymphatic: No  significant vascular findings are present. No enlarged abdominal or pelvic lymph nodes. Reproductive: Prostate radiotherapy seeds are present. Other: There is new Lora Chavers volume ascites throughout the abdomen and pelvis with some smooth peritoneal enhancement in the right paracolic gutter and pelvis. There is diffuse body wall edema which is new from prior. Musculoskeletal: There are new pathologic fractures of the lateral aspects of T9 and T10. Multilevel degenerative changes affect the spine. IMPRESSION: 1. New Lauri Purdum volume ascites throughout the abdomen and pelvis with some smooth peritoneal enhancement in the right paracolic gutter and pelvis. 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 2. New pathologic fractures of the lateral aspects of T9 and T10. 3. Multiple soft tissue tumors occupying the lower right hemithorax extending through the diaphragm, grossly unchanged, but incompletely evaluated. 4. Stable left hepatic mass. 5. Lower mediastinal soft tissue mass has significantly increased in size. 6. New diffuse body wall edema. 7. Nonobstructing left renal calculi. Electronically Signed   By: Darliss Cheney M.D.   On: 06/03/2023 20:12    Procedures Procedures    Medications Ordered in ED Medications  HYDROmorphone (DILAUDID) injection 1 mg (0 mg Intravenous Hold 06/03/23 1851)  midodrine (PROAMATINE) tablet 5 mg (5 mg Oral Given 06/03/23 2258)  sodium chloride 0.9 % bolus 1,000 mL (0 mLs Intravenous Stopped 06/03/23 1842)  ondansetron (ZOFRAN) injection 4 mg (4 mg Intravenous Given 06/03/23 1705)  HYDROmorphone (DILAUDID) injection 1 mg (1 mg Intravenous Given 06/03/23 1705)  iohexol (OMNIPAQUE) 300 MG/ML solution 100 mL (100 mLs Intravenous Contrast Given 06/03/23 1816)  sodium chloride 0.9 % bolus 500 mL (500 mLs Intravenous New Bag/Given 06/03/23 2245)    ED Course/ Medical Decision Making/ A&P                                 Medical Decision Making Patient is a 62 year old  male, history of prostate cancer, here for severe abdominal pain, that started after he had his chemotherapy yesterday.  He endorses nausea and vomiting.  He has severe tenderness to palpation of the abdomen, we will obtain a CT on pelvis for further evaluation as well as blood work.  Zofran, and Dilaudid ordered for pain control and nausea  Amount and/or Complexity of Data Reviewed Labs: ordered.    Details: No acute findings Radiology: ordered.    Details: CT on pelvis, shows findings concerning  for peritoneal carcinomatosis, as well as T9 and T10 fractures Discussion of management or test interpretation with external provider(s): Discussed with patient, he does have tenderness to the T9, T10, area of his back, I ordered a TLSO brace, patient refused this.  He was informed on risk associated with this, and voiced understanding.  Additionally he had finding of peritoneal carcinomatosis, this is markedly worse, then his last scans, I reach out to Dr. Shirline Frees, his oncologist, and he recommends admission to hospitalist, for pain Management, and he will see in AM.  Likely benefit from palliative consult.  Admitted to Dr.Doutova for further management  Risk Prescription drug management. Decision regarding hospitalization.    Final Clinical Impression(s) / ED Diagnoses Final diagnoses:  Generalized abdominal pain  Anasarca  Other closed fracture of thoracic vertebra, unspecified thoracic vertebral level, initial encounter [facility])    Rx / DC Orders ED Discharge Orders     None         Mathew Emley Elbert Ewings, PA 06/03/23 2341    Coral Spikes, DO 06/03/23 2341

## 2023-06-03 NOTE — Assessment & Plan Note (Addendum)
Appreciate oncology consult recommend palliative care consult will order. Would avoid Dilaudid given hypotension. Discussed with family goals of care at this point patient would like to be DO NOT RESUSCITATE DO NOT INTUBATE family and patient not ready for complete comfort care but would like to avoid aggressive interventions

## 2023-06-03 NOTE — Assessment & Plan Note (Addendum)
Currently soft blood pressures will hold home medications

## 2023-06-03 NOTE — Subjective & Objective (Addendum)
Started to have abdominal pain nausea and vomiting since received chemotherapy yesterday large BM yesterday  Hx of solitary fibrous tumor of the right lung followed by Dr. Arbutus Ped has been on a regimen of Docetaxel and Gemcitabine  Recent diagnosis of prostate cancer in February 2024 treated with radiation. 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

## 2023-06-03 NOTE — ED Triage Notes (Signed)
Pt arrived reporting vomiting and abdominal pain since yesterday after chemo. Reports vomiting has stopped. Denies any other symptoms.

## 2023-06-03 NOTE — Telephone Encounter (Signed)
"   I feel pretty bad. I feel like I have  increased pressure in abdomen".I instructed Mathew [name] to go to ED at [facility] and bring his chemo alert card.  He is going .

## 2023-06-03 NOTE — Assessment & Plan Note (Signed)
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

## 2023-06-03 NOTE — H&P (Signed)
Mathew [name]:[id number] DOB: 12-Apr-1961 DOA: 06/03/2023     PCP: [name], MD   Outpatient Specialists:     Oncology  Dr. Shirline Frees     Patient arrived to ER on 06/03/23 at 1524 Referred by Attending Coral Spikes, DO   Patient coming from:    home Lives With family     Chief Complaint:   Chief Complaint  Patient presents with   Abdominal Pain   Vomiting    HPI: Mathew [name] is a 62 y.o. 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 Arbutus Ped has been on a regimen of Docetaxel and Gemcitabine  Recent diagnosis of prostate cancer in February 2024 treated with radiation. 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    Patient did receive Neulasta support no fevers or chills no history of abdominal surgeries but have had radiation to the abdomen no shortness of breath or chest pain  Denies significant ETOH intake   Does not smoke   Lab Results  Component Value Date   SARSCOV2NAA NEGATIVE 02/23/2022   SARSCOV2NAA NEGATIVE 11/12/2021   SARSCOV2NAA NEGATIVE 04/25/2020      Regarding pertinent Chronic problems:         Last echogram in August 2023 showed preserved EF   BPH - on Flomax,        Chronic anemia - baseline hg Hemoglobin & Hematocrit  Recent Labs    05/25/23 1111 06/02/23 0926 06/03/23 1700  HGB 9.4* 8.3* 10.1*       Cancer: History of lung cancer on chemotherapy currently receiving Neulasta support last treatment was yesterday 06/02/2023 History of 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 Hemoglobin stable    While in ER:   CT scan showing 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     Lab Orders         CBC with Differential         Comprehensive metabolic panel         Lipase, blood         Lactic acid, plasma     CXR - ordered  CTabd/pelvis - scan showing 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 New pathologic fractures of T9-T10  Left hepatic mass stable increasing lower mediastinal soft tissues Diffuse body wall edema  Following Medications were ordered in ER: Medications  HYDROmorphone (DILAUDID) injection 1 mg (0 mg Intravenous Hold 06/03/23 1851)  sodium chloride 0.9 % bolus 1,000 mL (0 mLs Intravenous Stopped 06/03/23 1842)  ondansetron (ZOFRAN) injection 4 mg (4 mg Intravenous Given 06/03/23 1705)  HYDROmorphone (DILAUDID) injection 1 mg (1 mg Intravenous Given 06/03/23 1705)  iohexol (OMNIPAQUE) 300 MG/ML solution 100 mL (100 mLs Intravenous Contrast Given 06/03/23 1816)    _______________________________________________________ ER Provider Called:      Oncology  Dr. Arbutus Ped They Recommend admit to medicine  Will see in AM  pain control     ED Triage Vitals  Encounter Vitals Group     BP 06/03/23 1545 (!) 135/90     Systolic BP Percentile --      Diastolic BP Percentile --      Pulse Rate 06/03/23 1545 (!) 111  Resp 06/03/23 1545 18     Temp 06/03/23 1545 98 F (36.7 C)     Temp Source 06/03/23 1545 Oral     SpO2 06/03/23 1545 97 %     Weight --      Height 06/03/23 1602 5\' 9"  (1.753 m)     Head Circumference --      Peak Flow --      Pain Score 06/03/23 1602 4     Pain Loc --      Pain Education --      Exclude from Growth Chart --   QMVH(84)@     _________________________________________ Significant initial  Findings: Abnormal Labs Reviewed  CBC WITH DIFFERENTIAL/PLATELET - Abnormal; Notable for the following components:      Result Value   RBC 3.65 (*)    Hemoglobin 10.1 (*)    HCT 33.3 (*)    RDW 18.4 (*)    Neutro Abs 9.1 (*)    Lymphs Abs 0.2 (*)    Abs Immature Granulocytes 0.14 (*)    All other components within  normal limits  COMPREHENSIVE METABOLIC PANEL - Abnormal; Notable for the following components:   Glucose, Bld 118 (*)    Albumin 2.6 (*)    Alkaline Phosphatase 173 (*)    All other components within normal limits     Cardiac Panel (last 3 results) Recent Labs    06/03/23 1700  CKTOTAL 34*     ECG: Ordered Personally reviewed and interpreted by me showing: HR : 109 Rhythm: Sinus tachycardia Ventricular premature complex Prolonged PR interval Biatrial enlargement Borderline ST elevation, lateral leads similar to prior QTC 402   BNP (last 3 results) No results for input(s): "BNP" in the last 8760 hours.   COVID-19 Labs  No results for input(s): "DDIMER", "FERRITIN", "LDH", "CRP" in the last 72 hours.  Lab Results  Component Value Date   SARSCOV2NAA NEGATIVE 02/23/2022   SARSCOV2NAA NEGATIVE 11/12/2021   SARSCOV2NAA NEGATIVE 04/25/2020     The recent clinical data is shown below. Vitals:   06/03/23 1545 06/03/23 1602 06/03/23 1848 06/03/23 1946  BP: (!) 135/90  102/67   Pulse: (!) 111  100   Resp: 18  14   Temp: 98 F (36.7 C)   98.2 F (36.8 C)  TempSrc: Oral   Oral  SpO2: 97%  100%   Height:  5\' 9"  (1.753 m)      WBC     Component Value Date/Time   WBC 9.9 06/03/2023 1700   LYMPHSABS 0.2 (L) 06/03/2023 1700   MONOABS 0.5 06/03/2023 1700   EOSABS 0.0 06/03/2023 1700   BASOSABS 0.0 06/03/2023 1700    Lactic Acid, Venous    Component Value Date/Time   LATICACIDVEN 1.7 06/03/2023 1853       UA  ordered    Results for orders placed or performed in visit on 05/05/23  Urine Culture     Status: None   Collection Time: 05/05/23  9:23 AM   Specimen: Urine, Clean Catch  Result Value Ref Range Status   Specimen Description   Final    URINE, CLEAN CATCH Performed at [facility], [address], [place], [place] [address]    Special Requests   Final    NONE Performed at [facility], [address], [place], [place] [address]    Culture   Final    NO GROWTH Performed at [facility], [address], [place], [place] [address]    Report Status 05/06/2023 FINAL  Final    __________________________________________________________ Recent Labs  Lab 06/02/23 0926 06/03/23 1700  NA 141 138  K 3.8 3.8  CO2 31 28  GLUCOSE 107* 118*  BUN 20 19  CREATININE 0.74 0.78  CALCIUM 9.7 9.2    Cr    stable,  Lab Results  Component Value Date   CREATININE 0.78 06/03/2023   CREATININE 0.74 06/02/2023   CREATININE 0.97 05/25/2023    Recent Labs  Lab 06/02/23 0926 06/03/23 1700  AST 15 25  ALT 8 13  ALKPHOS 173* 173*  BILITOT 0.4 0.6  PROT 6.1* 6.8  ALBUMIN 3.0* 2.6*   Lab Results  Component Value Date   CALCIUM 9.2 06/03/2023   PHOS 2.2 (L) 02/25/2022        Plt: Lab Results  Component Value Date   PLT 250 06/03/2023       Recent Labs  Lab 06/02/23 0926 06/03/23 1700  WBC 19.5* 9.9  NEUTROABS 16.6* 9.1*  HGB 8.3* 10.1*  HCT 27.4* 33.3*  MCV 89.8 91.2  PLT 324 250    HG/HCT  stable,     Component Value Date/Time   HGB 10.1 (L) 06/03/2023 1700   HGB 8.3 (L) 06/02/2023 0926   HCT 33.3 (L) 06/03/2023 1700   MCV 91.2 06/03/2023 1700     Recent Labs  Lab 06/03/23 1700  LIPASE 23     _____________________________________ Hospitalist was called for admission for cancer related pain    The following Work up has been ordered so far:  Orders Placed This Encounter  Procedures   CT ABDOMEN PELVIS W CONTRAST   CBC with Differential   Comprehensive metabolic panel   Lipase, blood   Lactic acid, plasma   Apply TLSO brace   Maintain TLSO brace   Consult to oncology   Consult to hospitalist   ED EKG     OTHER Significant initial  Findings:  labs showing:     DM  labs:  HbA1C: No results for input(s): "HGBA1C" in the last 8760 hours.     CBG (last 3)  No results for input(s): "GLUCAP" in the last 72 hours.        Cultures:     Component Value Date/Time   SDES  05/05/2023 0923    URINE, CLEAN CATCH Performed at [facility], [address] [place], [place] [address]    SPECREQUEST  05/05/2023 3086    NONE Performed at [facility], [address], [place], [place] [address]    CULT  05/05/2023 [phone]    NO GROWTH Performed at [facility], [address] [place], [place] [address]    REPTSTATUS 05/06/2023 FINAL 05/05/2023 3244     Radiological Exams on Admission: CT ABDOMEN PELVIS W CONTRAST Result Date: 06/03/2023 CLINICAL DATA:  Acute abdominal pain. Undergoing chemotherapy. Prostate cancer. EXAM: CT ABDOMEN AND PELVIS WITH CONTRAST TECHNIQUE: Multidetector CT imaging of the abdomen and pelvis was performed using the standard protocol following bolus administration of intravenous contrast. RADIATION DOSE REDUCTION: This exam was performed according to the departmental dose-optimization program which includes automated exposure control, adjustment of the mA and/or kV according to patient size and/or use of iterative reconstruction technique. CONTRAST:  OMNIPAQUE IOHEXOL 300 MG/ML  SOLN COMPARISON:  CT chest abdomen and pelvis 03/20/2023 FINDINGS: Lower chest: Multiple soft tissue tumors occupying the lower right hemithorax extending through the diaphragm.  Largest measures 8.2 x 11.9 cm image 2/16. This is not significantly changed. There is soft tissue mass in the lower mediastinum the right of the esophagus at the level of the diaphragm measuring 7.9 x 11.6 cm which has significantly increased in size (previously 3.8 by 4.4 cm). Hepatobiliary: There is a stable left hepatic mass measuring 2.9 cm. No definite new hepatic masses are identified. No biliary ductal dilatation. The gallbladder is within normal limits. Pancreas: Unremarkable. No pancreatic ductal dilatation or surrounding inflammatory changes. Spleen: Normal in size without focal abnormality.  Adrenals/Urinary Tract: There are 2 cysts in the right kidney measuring up to 2.6 cm. There are punctate nonobstructing left renal calculi. Cortical hypodensities in the left kidney are too small to characterize, also likely cysts. No hydronephrosis identified. The right adrenal gland is not well defined. The left adrenal gland is within normal limits. Bladder is within normal limits. Stomach/Bowel: Stomach is within normal limits. Appendix appears normal. No evidence of bowel wall thickening, distention, or inflammatory changes. Vascular/Lymphatic: No significant vascular findings are present. No enlarged abdominal or pelvic lymph nodes. Reproductive: Prostate radiotherapy seeds are present. Other: There is new small volume ascites throughout the abdomen and pelvis with some smooth peritoneal enhancement in the right paracolic gutter and pelvis. There is diffuse body wall edema which is new from prior. Musculoskeletal: There are new pathologic fractures of the lateral aspects of T9 and T10. Multilevel degenerative changes affect the spine. IMPRESSION: 1. New small volume ascites throughout the abdomen and pelvis with some smooth peritoneal enhancement in the right paracolic gutter and pelvis. 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 2. New pathologic fractures of the lateral aspects of T9 and T10. 3. Multiple soft tissue tumors occupying the lower right hemithorax extending through the diaphragm, grossly unchanged, but incompletely evaluated. 4. Stable left hepatic mass. 5. Lower mediastinal soft tissue mass has significantly increased in size. 6. New diffuse body wall edema. 7. Nonobstructing left renal calculi. Electronically Signed   By: Darliss Cheney M.D.   On: 06/03/2023 20:12   _______________________________________________________________________________________________________ Latest  Blood pressure 102/67, pulse 100, temperature 98.2 F (36.8 C), temperature source Oral, resp. rate 14,  height 5\' 9"  (1.753 m), SpO2 100%.   Vitals  labs and radiology finding personally reviewed  Review of Systems:    Pertinent positives include:   abdominal pain, nausea, vomiting,   Constitutional:  No weight loss, night sweats, Fevers, chills, fatigue, weight loss  HEENT:  No headaches, Difficulty swallowing,Tooth/dental problems,Sore throat,  No sneezing, itching, ear ache, nasal congestion, post nasal drip,  Cardio-vascular:  No chest pain, Orthopnea, PND, anasarca, dizziness, palpitations.no Bilateral lower extremity swelling  GI:  No heartburn, indigestion,diarrhea, change in bowel habits, loss of appetite, melena, blood in stool, hematemesis Resp:  no shortness of breath at rest. No dyspnea on exertion, No excess mucus, no productive cough, No non-productive cough, No coughing up of blood.No change in color of mucus.No wheezing. Skin:  no rash or lesions. No jaundice GU:  no dysuria, change in color of urine, no urgency or frequency. No straining to urinate.  No flank pain.  Musculoskeletal:  No joint pain or no joint swelling. No decreased range of motion. No back pain.  Psych:  No change in mood or affect. No depression or anxiety. No memory loss.  Neuro: no localizing neurological complaints, no tingling, no weakness, no double vision, no gait abnormality, no slurred speech, no confusion  All systems reviewed and apart from HOPI all are negative _______________________________________________________________________________________________  Past Medical History:   Past Medical History:  Diagnosis Date   Anemia    Clubbing of nails    Deafness in left ear    Encounter for antineoplastic chemotherapy    History of kidney stones    Hypertension    Hypoglycemia due to neoplasm    Malignant neoplasm prostate [facility]) 07/2022   urologist--- dr pace;   bx 01/ 2024, Gleason 4+3   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   OA (osteoarthritis)    Rash    10-15-2022  per pt due to chemo   Shortness of breath dyspnea    just initially   Vitamin D deficiency    Wears hearing aid in both ears     Past Surgical History:  Procedure Laterality Date   GOLD SEED IMPLANT N/A 10/21/2022   Procedure: GOLD SEED IMPLANT;  Surgeon: Noel Christmas, MD;  Location: [facility];  Service: Urology;  Laterality: N/A;   INTERCOSTAL NERVE BLOCK Right 04/27/2020   Procedure: INTERCOSTAL NERVE BLOCK;  Surgeon: Loreli Slot, MD;  Location: [facility] OR;  Service: Thoracic;  Laterality: Right;   KNEE CARTILAGE SURGERY Left 1976   RESECTION OF MEDIASTINAL MASS Right 01/23/2016   @[facility]  by dr Dorris Fetch;   resection chest wall mass w/ en bloc wedge resection of RLL   RESECTION OF MEDIASTINAL MASS Right 11/13/2021   Procedure: RESECTION OF PLEURAL TUMORS;  Surgeon: Loreli Slot, MD;  Location: [facility] OR;  Service: Thoracic;  Laterality: Right;   SPACE OAR INSTILLATION N/A 10/21/2022   Procedure: SPACE OAR INSTILLATION;  Surgeon: Noel Christmas, MD;  Location: [facility];  Service: Urology;  Laterality: N/A;   THORACOTOMY Right 04/27/2020   Procedure: THORACOTOMY;  Surgeon: Loreli Slot, MD;  Location: [facility] OR;  Service: Thoracic;  Laterality: Right;   THORACOTOMY Right 11/13/2021   Procedure: REDO THORACOTOMY;  Surgeon: Loreli Slot, MD;  Location: [facility] OR;  Service: Thoracic;  Laterality: Right;   TONSILLECTOMY     child   VIDEO BRONCHOSCOPY WITH ENDOBRONCHIAL ULTRASOUND N/A 12/17/2015   Procedure: VIDEO BRONCHOSCOPY WITH ENDOBRONCHIAL ULTRASOUND;  Surgeon: Loreli Slot, MD;  Location: [facility] OR;  Service: Thoracic;   Laterality: N/A;    Social History:  Ambulatory   independently      reports that he has never smoked. He quit smokeless tobacco use about 46 years ago.  His smokeless tobacco use included chew. He reports that he does not currently use alcohol. He reports that he does not currently use drugs after having used the following drugs: Marijuana.   Family History:   Family History  Problem Relation Age of Onset   Diabetes type II Mother    Hypertension Father    ______________________________________________________________________________________________ Allergies: Allergies  Allergen Reactions   Aspirin Shortness Of Breath    Other Reaction(s): Wheezing   Penicillins Rash    Has patient had a PCN reaction causing immediate rash, facial/tongue/throat swelling, SOB or lightheadedness with hypotension:YES Has patient had a PCN reaction causing severe rash involving mucus membranes or skin necrosis: NO Has patient had a PCN reaction that required hospitalization NO Has  patient had a PCN reaction occurring within the last 10 years: NO If all of the above answers are "NO", then may proceed with Cephalosporin use.     Prior to Admission medications   Medication Sig Start Date End Date Taking? Authorizing Provider  dexamethasone (DECADRON) 4 MG tablet Take 2 tabs by mouth 2 times daily starting day before chemo. Then take 2 tabs daily for 2 days starting day after chemo. Take with food. 04/13/23   Si Gaul, MD  docusate sodium (COLACE) 100 MG capsule Take 1 capsule (100 mg total) by mouth every 12 (twelve) hours. Patient taking differently: Take 100 mg by mouth daily. 01/18/23   Eleonore Chiquito, FNP  folic acid (FOLVITE) 400 MCG tablet Take 400 mcg by mouth daily.    [provider]  furosemide (LASIX) 20 MG tablet TAKE 1 TABLET BY MOUTH EVERY DAY AS NEEDED 06/02/23   Heilingoetter, Cassandra L, PA-C  gabapentin (NEURONTIN) 100 MG capsule Take 300 mg by mouth 2 (two) times  daily. Patient not taking: Reported on 03/26/2023    [provider]  gabapentin (NEURONTIN) 600 MG tablet Take 600 mg by mouth 2 (two) times daily.    [provider]  Multiple Vitamin (MULTIVITAMIN) capsule Take 1 capsule by mouth daily.    [provider]  ondansetron (ZOFRAN) 8 MG tablet Take 1 tablet (8 mg total) by mouth every 8 (eight) hours as needed for nausea or vomiting. 04/13/23   Si Gaul, MD  oxyCODONE (ROXICODONE) 15 MG immediate release tablet Take 1 tablet (15 mg total) by mouth every 6 (six) hours as needed for pain. 03/19/23   Bruning, Ashlyn, PA-C  polyethylene glycol (MIRALAX / GLYCOLAX) 17 g packet Take 17 g by mouth 2 (two) times daily. 03/09/23   Joseph Art, DO  pregabalin (LYRICA) 100 MG capsule Take 100 mg by mouth 2 (two) times daily. 03/20/23   [provider]  tamsulosin (FLOMAX) 0.4 MG CAPS capsule Take 0.4 mg by mouth at bedtime. Urology started 8/27    [provider]    ___________________________________________________________________________________________________ Physical Exam:    06/03/2023    6:48 PM 06/03/2023    4:02 PM 06/03/2023    3:45 PM  Vitals with BMI  Height  5\' 9"    Systolic 102  135  Diastolic 67  90  Pulse 100  111     1. General:  in No  Acute distress   Chronically ill   -appearing 2. Psychological: Alert and   Oriented 3. Head/ENT:    Dry Mucous Membranes                          Head Non traumatic, neck supple                           Poor Dentition 4. SKIN: decreased Skin turgor,  Skin clean Dry and intact no rash    5. Heart: Regular rate and rhythm no  Murmur, no Rub or gallop 6. Lungs:   no wheezes or crackles   7. Abdomen: Soft, -tender,   distended  bowel sounds decreased 8. Lower extremities: no clubbing, cyanosis, 2+ edema 9. Neurologically Grossly intact, moving all 4 extremities equally   10. MSK: Normal range of motion    Chart has been  reviewed  ______________________________________________________________________________________________  Assessment/Plan  62 y.o. male with medical history significant of Lung Ca, prostate cancer, rectal fistula,  anemia, HTN,    Admitted for cancer related pain    Present on Admission:  Cancer associated pain  Anemia  Anorectal fistula  Hypertension  Malignant neoplasm of prostate (HCC)  Acute respiratory failure with hypoxia (HCC)     Anemia Obtain anemia panel  Transfuse for Hg <7 , rapidly dropping or  if symptomatic   Anorectal fistula Supportive management  Cancer associated pain Appreciate oncology consult recommend palliative care consult will order. Would avoid Dilaudid given hypotension. Discussed with family goals of care at this point patient would like to be DO NOT RESUSCITATE DO NOT INTUBATE family and patient not ready for complete comfort care but would like to avoid aggressive interventions  Hypertension Currently soft blood pressures will hold home medications  Malignant neoplasm of prostate (HCC) Status post radiation resulted in fistula formation with significant pain supportive management with pain control  Acute respiratory failure with hypoxia (HCC) Transiently O2 sats 88 on room air started on 2 L patient  received pain medications Obtain chest x-ray to evaluate for any evidence of fluid overload Avoid over aggressive fluid resuscitation Patient and family would like to avoid over aggressive testing at this time    Other plan as per orders.  DVT prophylaxis:  SCD         Code Status:  DNR/DNI  as per patient  family  I had personally discussed CODE STATUS with patient and family  At this point would like to avoid overaggressive interventions ok with gentle IV fluids and IV pain meds if BP allows ACP   none    Family Communication:   Family  at  Bedside  plan of care was discussed  with   Wife  Diet regular   Disposition Plan:        To home once workup is complete and patient is stable   Following barriers for discharge:                                                           Pain controlled with PO medications                                                           Will need consultants to evaluate patient prior to discharge       Consult Orders  (From admission, onward)           Start     Ordered   06/03/23 2030  Consult to hospitalist  Once       Provider:  (Not yet assigned)  Question Answer Comment  Place call to: [facility]   Reason for Consult Admit      06/03/23 2029                               Palliative care    consulted   Consults called: Oncology is aware will see patient in consult tomorrow   Admission status:  ED Disposition     ED Disposition  Admit   Condition  --   Comment  Hospital  Area: [facility] [[id number]]  Level of Care: Telemetry [5]  Admit to tele based on following criteria: Monitor QTC interval  May admit patient to [facility] or [facility] if equivalent level of care is available:: No  Covid Evaluation: Asymptomatic - no recent exposure (last 10 days) testing not required  Diagnosis: Cancer associated pain [309085]  Admitting Physician: Therisa Doyne [[id number]]  Attending Physician: Therisa Doyne [[id number]]  Certification:: I certify this patient will need inpatient services for at least 2 midnights  Expected Medical Readiness: 06/07/2023           inpatient     I Expect 2 midnight stay secondary to severity of patient's current illness need for inpatient interventions justified by the following:  hemodynamic instability despite optimal treatment (tachycardia  )  Severe lab/radiological/exam abnormalities including:   Evidence of cancer progression resulting in carcinomatosis and extensive comorbidities including:  malignancy,  That are currently affecting medical management.   I expect  patient to be  hospitalized for 2 midnights requiring inpatient medical care.  Patient is at high risk for adverse outcome (such as loss of life or disability) if not treated.  Indication for inpatient stay as follows:    severe pain requiring acute inpatient management,     Need for  IV pain medications,     Level of care     tele  For  24H       Sharona Rovner 06/03/2023, 9:55 PM    [facility]     after 2 AM please page floor coverage PA If 7AM-7PM, please contact the day team taking care of the patient using [email or web address]

## 2023-06-03 NOTE — Telephone Encounter (Signed)
GI-Since yesterday during tx and through the night he told his wife he had abdominal pain w/ gas and  bloating and that he had a large BM in infusion yesterday . He was restless all night and did not sleep well.   Today he told his wife that he vomited x 2 and had 2 large BM today . Wife was not at home at the time.  "He is sleeping really good now".

## 2023-06-03 NOTE — ED Notes (Signed)
Per ortho tech, brace will be applied in the AM so patient is not uncomfortable wearing it while on the stretcher

## 2023-06-03 NOTE — Assessment & Plan Note (Signed)
Supportive management 

## 2023-06-03 NOTE — Assessment & Plan Note (Signed)
 Obtain anemia panel  Transfuse for Hg <7 , rapidly dropping or  if symptomatic

## 2023-06-03 NOTE — Progress Notes (Signed)
Orthopedic Tech Progress Note Patient Details:  Mathew [name] [birth year] [id number]  Patient ID: [name], male   DOB: Aug 07, 1960, 62 y.o.   MRN: [id number]  Kizzie Fantasia 06/03/2023, 8:42 PM Patient declined TLSO. This Clinical research associate will check with patient again in morning

## 2023-06-03 NOTE — ED Notes (Signed)
Pt was able to ambulate to restroom with assistance to have bm. Pt was instructed to catch urine in hat so it can be collected. Pt pour urine in the toilet from hat and flushed it. Pt was given a urinal an asked to use it next time if possible.

## 2023-06-04 ENCOUNTER — Encounter (HOSPITAL_COMMUNITY): Payer: Self-pay | Admitting: Internal Medicine

## 2023-06-04 ENCOUNTER — Inpatient Hospital Stay (HOSPITAL_COMMUNITY): Payer: BC Managed Care – PPO

## 2023-06-04 ENCOUNTER — Inpatient Hospital Stay: Payer: BC Managed Care – PPO

## 2023-06-04 DIAGNOSIS — C7951 Secondary malignant neoplasm of bone: Secondary | ICD-10-CM

## 2023-06-04 DIAGNOSIS — K605 Anorectal fistula, unspecified: Secondary | ICD-10-CM | POA: Diagnosis not present

## 2023-06-04 DIAGNOSIS — Z79899 Other long term (current) drug therapy: Secondary | ICD-10-CM

## 2023-06-04 DIAGNOSIS — Z7189 Other specified counseling: Secondary | ICD-10-CM

## 2023-06-04 DIAGNOSIS — R601 Generalized edema: Secondary | ICD-10-CM | POA: Diagnosis not present

## 2023-06-04 DIAGNOSIS — G893 Neoplasm related pain (acute) (chronic): Secondary | ICD-10-CM | POA: Diagnosis not present

## 2023-06-04 DIAGNOSIS — Z515 Encounter for palliative care: Secondary | ICD-10-CM | POA: Diagnosis not present

## 2023-06-04 DIAGNOSIS — C3491 Malignant neoplasm of unspecified part of right bronchus or lung: Secondary | ICD-10-CM

## 2023-06-04 LAB — VITAMIN B12: Vitamin B-12: 2449 pg/mL — ABNORMAL HIGH (ref 180–914)

## 2023-06-04 LAB — COMPREHENSIVE METABOLIC PANEL
ALT: 12 U/L (ref 0–44)
AST: 20 U/L (ref 15–41)
Albumin: 2.1 g/dL — ABNORMAL LOW (ref 3.5–5.0)
Alkaline Phosphatase: 131 U/L — ABNORMAL HIGH (ref 38–126)
Anion gap: 9 (ref 5–15)
BUN: 23 mg/dL (ref 8–23)
CO2: 29 mmol/L (ref 22–32)
Calcium: 8.8 mg/dL — ABNORMAL LOW (ref 8.9–10.3)
Chloride: 102 mmol/L (ref 98–111)
Creatinine, Ser: 0.88 mg/dL (ref 0.61–1.24)
GFR, Estimated: >60 mL/min (ref >60)
Glucose, Bld: 116 mg/dL — ABNORMAL HIGH (ref 70–99)
Potassium: 3.8 mmol/L (ref 3.5–5.1)
Sodium: 140 mmol/L (ref 135–145)
Total Bilirubin: 0.6 mg/dL (ref <1.2)
Total Protein: 5.4 g/dL — ABNORMAL LOW (ref 6.5–8.1)

## 2023-06-04 LAB — IRON AND TIBC
Iron: 79 ug/dL (ref 45–182)
Saturation Ratios: 68 % — ABNORMAL HIGH (ref 17.9–39.5)
TIBC: 116 ug/dL — ABNORMAL LOW (ref 250–450)
UIBC: 37 ug/dL

## 2023-06-04 LAB — CBC
HCT: 27.1 % — ABNORMAL LOW (ref 39.0–52.0)
Hemoglobin: 7.8 g/dL — ABNORMAL LOW (ref 13.0–17.0)
MCH: 27 pg (ref 26.0–34.0)
MCHC: 28.8 g/dL — ABNORMAL LOW (ref 30.0–36.0)
MCV: 93.8 fL (ref 80.0–100.0)
Platelets: 222 10*3/uL (ref 150–400)
RBC: 2.89 MIL/uL — ABNORMAL LOW (ref 4.22–5.81)
RDW: 18.4 % — ABNORMAL HIGH (ref 11.5–15.5)
WBC: 10.4 10*3/uL (ref 4.0–10.5)
nRBC: 0 % (ref 0.0–0.2)

## 2023-06-04 LAB — URINALYSIS, COMPLETE (UACMP) WITH MICROSCOPIC
Bacteria, UA: NONE SEEN
Bilirubin Urine: NEGATIVE
Glucose, UA: NEGATIVE mg/dL
Hgb urine dipstick: NEGATIVE
Ketones, ur: NEGATIVE mg/dL
Leukocytes,Ua: NEGATIVE
Nitrite: NEGATIVE
Protein, ur: 30 mg/dL — AB
Specific Gravity, Urine: 1.038 — ABNORMAL HIGH (ref 1.005–1.030)
pH: 5 (ref 5.0–8.0)

## 2023-06-04 LAB — FOLATE: Folate: 7.8 ng/mL (ref >5.9)

## 2023-06-04 LAB — FERRITIN: Ferritin: 841 ng/mL — ABNORMAL HIGH (ref 24–336)

## 2023-06-04 LAB — MAGNESIUM: Magnesium: 1.9 mg/dL (ref 1.7–2.4)

## 2023-06-04 LAB — MRSA NEXT GEN BY PCR, NASAL: MRSA by PCR Next Gen: NOT DETECTED

## 2023-06-04 LAB — PHOSPHORUS: Phosphorus: 3.8 mg/dL (ref 2.5–4.6)

## 2023-06-04 MED ORDER — SODIUM CHLORIDE 0.9 % IV BOLUS
250.0000 mL | Freq: Once | INTRAVENOUS | Status: AC
  Administered 2023-06-04: 250 mL via INTRAVENOUS

## 2023-06-04 MED ORDER — SODIUM CHLORIDE 0.9% FLUSH
10.0000 mL | INTRAVENOUS | Status: DC | PRN
  Administered 2023-06-05: 10 mL

## 2023-06-04 MED ORDER — CHLORHEXIDINE GLUCONATE CLOTH 2 % EX PADS
6.0000 | MEDICATED_PAD | Freq: Every day | CUTANEOUS | Status: DC
  Administered 2023-06-04: 6 via TOPICAL

## 2023-06-04 MED ORDER — MESALAMINE 1000 MG RE SUPP
1000.0000 mg | Freq: Every day | RECTAL | Status: DC
  Administered 2023-06-04: 1000 mg via RECTAL
  Filled 2023-06-04 (×2): qty 1

## 2023-06-04 MED ORDER — SODIUM CHLORIDE 0.9 % IV SOLN
500.0000 mg | INTRAVENOUS | Status: DC
  Administered 2023-06-04: 500 mg via INTRAVENOUS
  Filled 2023-06-04: qty 5

## 2023-06-04 MED ORDER — SODIUM CHLORIDE 0.9% FLUSH
10.0000 mL | Freq: Two times a day (BID) | INTRAVENOUS | Status: DC
  Administered 2023-06-04 – 2023-06-05 (×2): 10 mL

## 2023-06-04 MED ORDER — DOCUSATE SODIUM 100 MG PO CAPS
200.0000 mg | ORAL_CAPSULE | Freq: Two times a day (BID) | ORAL | Status: DC
  Administered 2023-06-04 – 2023-06-05 (×2): 200 mg via ORAL
  Filled 2023-06-04 (×2): qty 2

## 2023-06-04 MED ORDER — HYDROCODONE-ACETAMINOPHEN 5-325 MG PO TABS
1.0000 | ORAL_TABLET | ORAL | Status: DC | PRN
  Administered 2023-06-04: 2 via ORAL
  Filled 2023-06-04: qty 2

## 2023-06-04 MED ORDER — FENTANYL CITRATE PF 50 MCG/ML IJ SOSY: 12.5000 ug | PREFILLED_SYRINGE | INTRAMUSCULAR | Status: DC | PRN

## 2023-06-04 MED ORDER — PREGABALIN 75 MG PO CAPS
200.0000 mg | ORAL_CAPSULE | Freq: Three times a day (TID) | ORAL | Status: DC
  Administered 2023-06-04 – 2023-06-05 (×3): 200 mg via ORAL
  Filled 2023-06-04 (×3): qty 2

## 2023-06-04 MED ORDER — SODIUM CHLORIDE 0.9 % IV SOLN: INTRAVENOUS | Status: AC | PRN

## 2023-06-04 MED ORDER — TAMSULOSIN HCL 0.4 MG PO CAPS
0.4000 mg | ORAL_CAPSULE | Freq: Every day | ORAL | Status: DC
  Administered 2023-06-04: 0.4 mg via ORAL
  Filled 2023-06-04: qty 1

## 2023-06-04 MED ORDER — ACETAMINOPHEN 650 MG RE SUPP: 650.0000 mg | Freq: Four times a day (QID) | RECTAL | Status: DC | PRN

## 2023-06-04 MED ORDER — OXYCODONE HCL ER 15 MG PO T12A
30.0000 mg | EXTENDED_RELEASE_TABLET | Freq: Two times a day (BID) | ORAL | Status: DC
  Administered 2023-06-04 – 2023-06-05 (×2): 30 mg via ORAL
  Filled 2023-06-04 (×2): qty 2

## 2023-06-04 MED ORDER — ACETAMINOPHEN 325 MG PO TABS: 650.0000 mg | ORAL_TABLET | Freq: Four times a day (QID) | ORAL | Status: DC | PRN

## 2023-06-04 MED ORDER — ONDANSETRON HCL 4 MG/2ML IJ SOLN
4.0000 mg | Freq: Four times a day (QID) | INTRAMUSCULAR | Status: DC | PRN
  Administered 2023-06-04: 4 mg via INTRAVENOUS
  Filled 2023-06-04: qty 2

## 2023-06-04 MED ORDER — SODIUM CHLORIDE 0.9 % IV SOLN: INTRAVENOUS | Status: AC

## 2023-06-04 MED ORDER — IOHEXOL 300 MG/ML  SOLN
75.0000 mL | Freq: Once | INTRAMUSCULAR | Status: AC | PRN
  Administered 2023-06-04: 75 mL via INTRAVENOUS

## 2023-06-04 MED ORDER — SODIUM CHLORIDE 0.9 % IV SOLN
2.0000 g | INTRAVENOUS | Status: DC
  Administered 2023-06-04: 2 g via INTRAVENOUS
  Filled 2023-06-04: qty 20

## 2023-06-04 MED ORDER — GABAPENTIN 400 MG PO CAPS
800.0000 mg | ORAL_CAPSULE | Freq: Every day | ORAL | Status: DC
  Administered 2023-06-04: 800 mg via ORAL
  Filled 2023-06-04: qty 2

## 2023-06-04 MED ORDER — SENNA 8.6 MG PO TABS
2.0000 | ORAL_TABLET | Freq: Once | ORAL | Status: AC
  Administered 2023-06-04: 17.2 mg via ORAL
  Filled 2023-06-04: qty 2

## 2023-06-04 MED ORDER — OXYCODONE HCL 5 MG PO TABS
15.0000 mg | ORAL_TABLET | Freq: Four times a day (QID) | ORAL | Status: DC | PRN
  Administered 2023-06-04 – 2023-06-05 (×2): 15 mg via ORAL
  Filled 2023-06-04 (×2): qty 3

## 2023-06-04 MED ORDER — ONDANSETRON HCL 4 MG PO TABS: 4.0000 mg | ORAL_TABLET | Freq: Four times a day (QID) | ORAL | Status: DC | PRN

## 2023-06-04 NOTE — Progress Notes (Addendum)
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  Evaluation in the ED CT abdomen and pelvis showed 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, new pathologic fracture T9-T10, left hepatic mass stable from previous, increasing lower mediastinal soft tissue mass, diffuse body wall edema.  Patient had low blood pressure and was started on midodrine.  He is DNR, but will agree with IV pressors.  He would also agree with thoracentesis and paracentesis if needed   Assessment & Plan:   Principal Problem:   Cancer associated pain Active Problems:   Hypertension   Anemia   Malignant neoplasm of prostate (HCC)   Anorectal fistula   Acute respiratory failure with hypoxia (HCC)   Anasarca   Malignant neoplasm of right lung (HCC)   Malignant neoplasm metastatic to bone (HCC)   High risk medication use   Counseling and coordination of care   Palliative care encounter  1-Acute hypoxic respiratory failure Pleural effusion,Loculated Fibrous tumor of the right lung Patient was noted to be hypoxic, oxygen saturation 88 on room air placed on oxygen supplementation.  Chest x-ray showed new right lateral pleural thickening or loculated fluid.  Dr. Vassie Loll review x-ray and he report patient has pleural malignancy, less likely fluid no need for thoracentesis at this point. Will follow-up CT chest, depending on results we will ask pulmonology to reevaluate -Continue with IV antibiotics, ceftriaxone, azithro  2-Ascites, abdominal distention: Present with nausea vomiting, worsening abdominal distention and  pain: -CT: with small amount of ascites fluid.  Finding worrisome for peritoneal carcinomatosis -Concern for malignant ascites -Limited ultrasound ordered, will ask IR to evaluate for paracentesis  New T9 and T10 pathologic fracture: Cancer associated pain: Palliative care has been consulted for pain management  Anemia of malignancy: Monitor hemoglobin transfuse for hemoglobin less than 7  Rectal fistula: Continue with supportive care, resume mesalamine, steroid and nifedipine  Hypertension: Blood pressure was low holding blood pressure medication  Hypotension: Started on low-dose midodrine, added IV antibiotics.   Estimated body mass index is 22.98 kg/m as calculated from the following:   Height as of this encounter: 5\' 9"  (1.753 m).   Weight as of this encounter: 70.6 kg.   DVT prophylaxis: SCDs Code Status: DNR Family Communication: Multiple family at bedside Disposition Plan:  Status is: Inpatient Remains inpatient appropriate because: Management of respiratory failure    Consultants:  Dr. Shirline Frees  Procedures:  Ultrasound  Antimicrobials:  Ceftriaxone and azithromycin  Subjective: He is alert, in mild distress, he reports shortness of breath, feels abdomen is distended.  Is on 2 L of oxygen   Objective: Vitals:   06/04/23 0521 06/04/23 0752 06/04/23 0932 06/04/23 1109  BP:  100/69  137/76  Pulse:  95  93  Resp:  18  (!) 22  Temp: 98 F (36.7 C)  98.6 F (37 C) 98.3 F (36.8 C)  TempSrc: Oral   Oral  SpO2:  100%  99%  Weight:    70.6 kg  Height:        Intake/Output Summary (Last 24 hours) at 06/04/2023 1557 Last data filed at 06/04/2023 0458 Gross per 24 hour  Intake  253.49 ml  Output --  Net 253.49 ml   Filed Weights   06/04/23 1109  Weight: 70.6 kg    Examination:  General exam: Appears calm and comfortable  Respiratory system: tachypnea, BL ronchus Cardiovascular system: S1 & S2 heard, RRR.  Gastrointestinal system: Abdomen is  nondistended, soft and nontender. No organomegaly or masses felt. Normal bowel sounds heard. Central nervous system: Alert and oriented. Extremities: Symmetric 5 x 5 power.     Data Reviewed: I have personally reviewed following labs and imaging studies  CBC: Recent Labs  Lab 06/02/23 0926 06/03/23 1700 06/04/23 0500  WBC 19.5* 9.9 10.4  NEUTROABS 16.6* 9.1*  --   HGB 8.3* 10.1* 7.8*  HCT 27.4* 33.3* 27.1*  MCV 89.8 91.2 93.8  PLT 324 250 222   Basic Metabolic Panel: Recent Labs  Lab 06/02/23 0926 06/03/23 1700 06/04/23 0500  NA 141 138 140  K 3.8 3.8 3.8  CL 102 99 102  CO2 31 28 29   GLUCOSE 107* 118* 116*  BUN 20 19 23   CREATININE 0.74 0.78 0.88  CALCIUM 9.7 9.2 8.8*  MG  --  2.1 1.9  PHOS  --  3.6 3.8   GFR: Estimated Creatinine Clearance: 84.7 mL/min (by C-G formula based on SCr of 0.88 mg/dL). Liver Function Tests: Recent Labs  Lab 06/02/23 0926 06/03/23 1700 06/04/23 0500  AST 15 25 20   ALT 8 13 12   ALKPHOS 173* 173* 131*  BILITOT 0.4 0.6 0.6  PROT 6.1* 6.8 5.4*  ALBUMIN 3.0* 2.6* 2.1*   Recent Labs  Lab 06/03/23 1700  LIPASE 23   No results for input(s): "AMMONIA" in the last 168 hours. Coagulation Profile: No results for input(s): "INR", "PROTIME" in the last 168 hours. Cardiac Enzymes: Recent Labs  Lab 06/03/23 1700  CKTOTAL 34*   BNP (last 3 results) No results for input(s): "PROBNP" in the last 8760 hours. HbA1C: No results for input(s): "HGBA1C" in the last 72 hours. CBG: No results for input(s): "GLUCAP" in the last 168 hours. Lipid Profile: No results for input(s): "CHOL", "HDL", "LDLCALC", "TRIG", "CHOLHDL", "LDLDIRECT" in the last 72 hours. Thyroid Function Tests: No results for input(s): "TSH", "T4TOTAL", "FREET4", "T3FREE", "THYROIDAB" in the last 72 hours. Anemia Panel: Recent Labs    06/03/23 1700 06/04/23 0500  VITAMINB12  --  2,449*  FOLATE  --  7.8  FERRITIN  --  841*  TIBC  --  116*  IRON  --  79   RETICCTPCT 2.8  --    Sepsis Labs: Recent Labs  Lab 06/03/23 1700 06/03/23 1853  LATICACIDVEN 1.9 1.7    Recent Results (from the past 240 hours)  MRSA Next Gen by PCR, Nasal     Status: None   Collection Time: 06/04/23 11:09 AM   Specimen: Nasal Mucosa; Nasal Swab  Result Value Ref Range Status   MRSA by PCR Next Gen NOT DETECTED NOT DETECTED Final    Comment: (NOTE) The GeneXpert MRSA Assay (FDA approved for NASAL specimens only), is one component of a comprehensive MRSA colonization surveillance program. It is not intended to diagnose MRSA infection nor to guide or monitor treatment for MRSA infections. Test performance is not FDA approved in patients less than 68 years old. Performed at [facility], [address], [place], [place] [address]          Radiology Studies: US Abdomen Limited Result Date: 06/04/2023 CLINICAL DATA:  Ascites EXAM: LIMITED ABDOMEN ULTRASOUND FOR ASCITES TECHNIQUE: Limited ultrasound  survey for ascites was performed in all four abdominal quadrants. COMPARISON:  CT abdomen pelvis, 06/03/2023 FINDINGS: Small volume ascites throughout the abdomen and pelvis. Maximum pocket depth on provided images 4.8 cm in the right lower quadrant. IMPRESSION: Small volume ascites throughout the abdomen and pelvis. Maximum pocket depth on provided images 4.8 cm in the right lower quadrant. Electronically Signed   By: Jearld Lesch M.D.   On: 06/04/2023 11:34   DG CHEST PORT 1 VIEW Result Date: 06/03/2023 CLINICAL DATA:  Hypoxia EXAM: PORTABLE CHEST 1 VIEW COMPARISON:  Chest x-ray 02/23/2022.  CT chest 03/20/2023. FINDINGS: There is new right lateral pleural thickening or loculated fluid. Right basilar opacities appear similar to prior. The cardiomediastinal silhouette is stable. Left lung is clear. There is no pneumothorax. Right chest port catheter tip ends in the distal SVC. No acute fractures are identified. IMPRESSION: 1. New right lateral  pleural thickening or loculated fluid. 2. Right basilar opacities appear similar to prior. Electronically Signed   By: Darliss Cheney M.D.   On: 06/03/2023 22:57   CT ABDOMEN PELVIS W CONTRAST Result Date: 06/03/2023 CLINICAL DATA:  Acute abdominal pain. Undergoing chemotherapy. Prostate cancer. EXAM: CT ABDOMEN AND PELVIS WITH CONTRAST TECHNIQUE: Multidetector CT imaging of the abdomen and pelvis was performed using the standard protocol following bolus administration of intravenous contrast. RADIATION DOSE REDUCTION: This exam was performed according to the departmental dose-optimization program which includes automated exposure control, adjustment of the mA and/or kV according to patient size and/or use of iterative reconstruction technique. CONTRAST:  OMNIPAQUE IOHEXOL 300 MG/ML  SOLN COMPARISON:  CT chest abdomen and pelvis 03/20/2023 FINDINGS: Lower chest: Multiple soft tissue tumors occupying the lower right hemithorax extending through the diaphragm. Largest measures 8.2 x 11.9 cm image 2/16. This is not significantly changed. There is soft tissue mass in the lower mediastinum the right of the esophagus at the level of the diaphragm measuring 7.9 x 11.6 cm which has significantly increased in size (previously 3.8 by 4.4 cm). Hepatobiliary: There is a stable left hepatic mass measuring 2.9 cm. No definite new hepatic masses are identified. No biliary ductal dilatation. The gallbladder is within normal limits. Pancreas: Unremarkable. No pancreatic ductal dilatation or surrounding inflammatory changes. Spleen: Normal in size without focal abnormality. Adrenals/Urinary Tract: There are 2 cysts in the right kidney measuring up to 2.6 cm. There are punctate nonobstructing left renal calculi. Cortical hypodensities in the left kidney are too small to characterize, also likely cysts. No hydronephrosis identified. The right adrenal gland is not well defined. The left adrenal gland is within normal limits.  Bladder is within normal limits. Stomach/Bowel: Stomach is within normal limits. Appendix appears normal. No evidence of bowel wall thickening, distention, or inflammatory changes. Vascular/Lymphatic: No significant vascular findings are present. No enlarged abdominal or pelvic lymph nodes. Reproductive: Prostate radiotherapy seeds are present. Other: There is new small volume ascites throughout the abdomen and pelvis with some smooth peritoneal enhancement in the right paracolic gutter and pelvis. There is diffuse body wall edema which is new from prior. Musculoskeletal: There are new pathologic fractures of the lateral aspects of T9 and T10. Multilevel degenerative changes affect the spine. IMPRESSION: 1. New small volume ascites throughout the abdomen and pelvis with some smooth peritoneal enhancement in the right paracolic gutter and pelvis. 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 2. New pathologic fractures of the lateral aspects of T9 and T10. 3. Multiple soft tissue tumors occupying the lower right hemithorax extending through the diaphragm,  grossly unchanged, but incompletely evaluated. 4. Stable left hepatic mass. 5. Lower mediastinal soft tissue mass has significantly increased in size. 6. New diffuse body wall edema. 7. Nonobstructing left renal calculi. Electronically Signed   By: Darliss Cheney M.D.   On: 06/03/2023 20:12        Scheduled Meds:  Chlorhexidine Gluconate Cloth  6 each Topical Daily   gabapentin  800 mg Oral QHS   mesalamine  1,000 mg Rectal QHS   midodrine  5 mg Oral TID WC   oxyCODONE  30 mg Oral Q12H   pregabalin  200 mg Oral TID   tamsulosin  0.4 mg Oral QPC supper   Continuous Infusions:   LOS: 1 day    Time spent: 35 minutes    Joneric Streight A Alcides Nutting, MD [facility]   If 7PM-7AM, please contact night-coverage [email or web address]  06/04/2023, 3:57 PM

## 2023-06-04 NOTE — Progress Notes (Signed)
Orthopedic Tech Progress Note Patient Details:  Mathew [name] Apr 24, 1961 [id number]  Patient ID: [name], male   DOB: January 27, 1961, 62 y.o.   MRN: [id number]  Kizzie Fantasia 06/04/2023, 12:32 PM Patient declined tlso brace a second time.

## 2023-06-04 NOTE — Progress Notes (Addendum)
Mathew [name]   DOB:[birth year]   ZO#:[id number]      ASSESSMENT & PLAN:  1.  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   -He had recurrence in 2021 for which he was started on sunitinib and subsequently pazopanib, both discontinued due to disease progression.  -Surgically, he underwent right thoracotomy with resection of pleural tumors and lymph node dissection in 2023.   -Patient had systemic chemotherapy with doxorubicin, ifosfamide, and mesna in 2023.  This treatment was also discontinued due to disease progression.  Subsequently had Temodar with IV Avastin, also discontinued due to disease progression. -Currently patient has been receiving palliative systemic chemotherapy with gemcitabine and docetaxel every 3 weeks he is status post 3 cycles.  G-CSF support with Neulasta has been given per protocol.   -Chemotherapy will be on hold during hospitalization. -Medical oncology/Dr. Arbutus Ped following closely.  2.  Ascites/peritoneal carcinomatosis -Likely due to malignancy -CT A/P done 06/03/2023 shows small volume ascites throughout abdomen and pelvis, findings worrisome for peritoneal carcinomatosis.  Also shows stable left hepatic mass.  Lower mediastinal soft tissue mass has significantly increased in size. -Also shows new pathologic fractures of T9-T10. -Medical oncology following closely  3.  Prostate adenocarcinoma  -with Gleason score of 7 -Diagnosed February 2024 -Patient is status post radiation therapy, last day of RT was 12/17/2022 -Follows with urology  4.  Pleural effusion -Likely due to malignancy -CT chest is ordered we will follow results -Pulmonary eval recommended  5. 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  Anemia, normocytic -Likely multifactorial due to recent chemo + malignancy+ chronic disease -Hemoglobin low 7.8 today.  Repeat CBC with differential in AM -Transfuse PRBC for Hgb less than 7.0 -Monitor closely   Code Status DNR-Limited  Goals of care: Incurable, treatable.  Discharge planning: Home when medically optimized  Subjective:  Patient seen awake and somewhat lethargic, thin, and ill appearing laying in bed.  Patient admits to DOE x 1 week with abdominal pain greater than 1 week.  Denies dizziness, chest pain, active bleeding.  Denies chills, fevers.  Admits to nausea immediately after chemo only.  Feels fatigued.  Family at bedside.  Objective:  Vitals:   06/04/23 0752 06/04/23 0932  BP: 100/69   Pulse: 95   Resp: 18   Temp:  98.6 F (37 C)  SpO2: 100%      Intake/Output Summary (Last 24 hours) at 06/04/2023 1009 Last data filed at 06/04/2023 0458 Gross per 24 hour  Intake 253.49 ml  Output --  Net 253.49 ml     REVIEW OF SYSTEMS:   Constitutional: +fatigue, Denies fevers, chills or abnormal night sweats Eyes: Denies blurriness of vision, double vision or watery eyes Ears, nose, mouth, throat, and face: Denies mucositis or sore throat Respiratory: Denies cough,  +Dyspnea Cardiovascular: Denies palpitation, chest discomfort or lower extremity swelling Gastrointestinal: +intermittent nausea, +slight abd pain  Skin: Denies abnormal skin rashes Lymphatics: Denies new lymphadenopathy or easy bruising Neurological: Denies numbness, tingling or new weaknesses Behavioral/Psych: Mood is stable, no new changes  All other systems were reviewed with the patient and are negative.  PHYSICAL EXAMINATION: ECOG PERFORMANCE STATUS: 3 - Symptomatic, >50% confined to bed  Vitals:   06/04/23 0752 06/04/23 0932  BP: 100/69   Pulse: 95  Resp: 18   Temp:  98.6 F (37 C)  SpO2: 100%    There were no vitals filed for this visit.  GENERAL: alert, +  thin +ill-appearing  SKIN: skin color, texture, turgor are normal, no rashes or significant lesions EYES: normal, conjunctiva are pink and non-injected, sclera clear OROPHARYNX: no exudate, no erythema and lips, buccal mucosa, and tongue normal  NECK: supple, thyroid normal size, non-tender, without nodularity LYMPH: no palpable lymphadenopathy in the cervical, axillary or inguinal LUNGS: +diminished breath sounds, right >left  HEART: regular rate & rhythm and no murmurs and no lower extremity edema ABDOMEN: abdomen soft, non-tender and normal bowel sounds MUSCULOSKELETAL: no cyanosis of digits and no clubbing  PSYCH: alert & oriented x 3 with fluent speech, +somewhat lethargic NEURO: no focal motor/sensory deficits   All questions were answered. The patient knows to call the clinic with any problems, questions or concerns.   The total time spent in the appointment was 55 minutes encounter with patient including review of chart and various tests results, discussions about plan of care and coordination of care plan  Mathew Bills, NP 06/04/2023 10:09 AM    Labs Reviewed:  Lab Results  Component Value Date   WBC 10.4 06/04/2023   HGB 7.8 (L) 06/04/2023   HCT 27.1 (L) 06/04/2023   MCV 93.8 06/04/2023   PLT 222 06/04/2023   Recent Labs    06/02/23 0926 06/03/23 1700 06/04/23 0500  NA 141 138 140  K 3.8 3.8 3.8  CL 102 99 102  CO2 31 28 29   GLUCOSE 107* 118* 116*  BUN 20 19 23   CREATININE 0.74 0.78 0.88  CALCIUM 9.7 9.2 8.8*  GFRNONAA >60 >60 >60  PROT 6.1* 6.8 5.4*  ALBUMIN 3.0* 2.6* 2.1*  AST 15 25 20   ALT 8 13 12   ALKPHOS 173* 173* 131*  BILITOT 0.4 0.6 0.6    Studies Reviewed:  DG CHEST PORT 1 VIEW Result Date: 06/03/2023 CLINICAL DATA:  Hypoxia EXAM: PORTABLE CHEST 1 VIEW COMPARISON:  Chest x-ray 02/23/2022.  CT chest 03/20/2023. FINDINGS: There is new right lateral pleural thickening or loculated fluid. Right basilar opacities appear similar to prior. The  cardiomediastinal silhouette is stable. Left lung is clear. There is no pneumothorax. Right chest port catheter tip ends in the distal SVC. No acute fractures are identified. IMPRESSION: 1. New right lateral pleural thickening or loculated fluid. 2. Right basilar opacities appear similar to prior. Electronically Signed   By: Darliss Cheney M.D.   On: 06/03/2023 22:57   CT ABDOMEN PELVIS W CONTRAST Result Date: 06/03/2023 CLINICAL DATA:  Acute abdominal pain. Undergoing chemotherapy. Prostate cancer. EXAM: CT ABDOMEN AND PELVIS WITH CONTRAST TECHNIQUE: Multidetector CT imaging of the abdomen and pelvis was performed using the standard protocol following bolus administration of intravenous contrast. RADIATION DOSE REDUCTION: This exam was performed according to the departmental dose-optimization program which includes automated exposure control, adjustment of the mA and/or kV according to patient size and/or use of iterative reconstruction technique. CONTRAST:  OMNIPAQUE IOHEXOL 300 MG/ML  SOLN COMPARISON:  CT chest abdomen and pelvis 03/20/2023 FINDINGS: Lower chest: Multiple soft tissue tumors occupying the lower right hemithorax extending through the diaphragm. Largest measures 8.2 x 11.9 cm image 2/16. This is not significantly changed. There is soft tissue mass in the lower mediastinum the right of the esophagus at the level of the diaphragm measuring 7.9 x 11.6 cm which has significantly increased in size (previously 3.8 by 4.4 cm). Hepatobiliary:  There is a stable left hepatic mass measuring 2.9 cm. No definite new hepatic masses are identified. No biliary ductal dilatation. The gallbladder is within normal limits. Pancreas: Unremarkable. No pancreatic ductal dilatation or surrounding inflammatory changes. Spleen: Normal in size without focal abnormality. Adrenals/Urinary Tract: There are 2 cysts in the right kidney measuring up to 2.6 cm. There are punctate nonobstructing left renal calculi. Cortical  hypodensities in the left kidney are too small to characterize, also likely cysts. No hydronephrosis identified. The right adrenal gland is not well defined. The left adrenal gland is within normal limits. Bladder is within normal limits. Stomach/Bowel: Stomach is within normal limits. Appendix appears normal. No evidence of bowel wall thickening, distention, or inflammatory changes. Vascular/Lymphatic: No significant vascular findings are present. No enlarged abdominal or pelvic lymph nodes. Reproductive: Prostate radiotherapy seeds are present. Other: There is new small volume ascites throughout the abdomen and pelvis with some smooth peritoneal enhancement in the right paracolic gutter and pelvis. There is diffuse body wall edema which is new from prior. Musculoskeletal: There are new pathologic fractures of the lateral aspects of T9 and T10. Multilevel degenerative changes affect the spine. IMPRESSION: 1. New small volume ascites throughout the abdomen and pelvis with some smooth peritoneal enhancement in the right paracolic gutter and pelvis. 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 2. New pathologic fractures of the lateral aspects of T9 and T10. 3. Multiple soft tissue tumors occupying the lower right hemithorax extending through the diaphragm, grossly unchanged, but incompletely evaluated. 4. Stable left hepatic mass. 5. Lower mediastinal soft tissue mass has significantly increased in size. 6. New diffuse body wall edema. 7. Nonobstructing left renal calculi. Electronically Signed   By: Darliss Cheney M.D.   On: 06/03/2023 20:12   ADDENDUM: Hematology/Oncology Attending: The patient is seen and examined today.  His wife and daughter were at the bedside.  This is a very pleasant 62 years old African-American male diagnosed with recurrent solitary fibrous tumor of the right lung in June 2017 status post multiple treatment including several surgeries for resection of his tumor as well as  palliative radiation.  He was also treated with sunitinib then pazopanib with disease progression.  The patient was then referred to [facility] center and seen by Dr. Waymon Amato who recommended for him treatment with doxorubicin ifosfamide and mesna in 2023 discontinued secondary to disease progression.  He was then treated with Temodar and Avastin again discontinued secondary to disease progression.  Recently he has been on systemic treatment with gemcitabine and docetaxel with Neulasta support status post 3 cycles.  He was supposed to have restaging scan tomorrow but the patient has been complaining of increasing abdominal pain and distention.  He presented to the emergency department for evaluation and imaging studies including CT scan of the abdomen and pelvis showed new small volume ascites throughout the abdomen and pelvis with some smooth peritoneal enhancement in the right paracolic gutter and pelvis.  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  There was also a new pathologic fractures of the lateral aspect of T9 and T10 with multiple soft tissue tumor occupying the lower right hemothorax extending through the diaphragm but unchanged.  There was also stable left hepatic mass and lower mediastinal soft tissue mass significantly increased in size with new diffuse body wall edema. I had a lengthy discussion with the patient and his family today about his condition.  The patient understand his poor prognosis with the current condition.  I will complete the  staging workup by ordering CT scan of the chest to rule out any disease progression in the chest. We will discuss his condition with Dr. Waymon Amato at [facility] to see if he has any other recommendation.  The patient was also seen by Dr. Salomon Fick a sarcoma specialist at [facility] who at some point recommended treatment with dacarbazine after failure of the current treatment. I also discussed with the  patient briefly the option of palliative care and hospice but he would like to consider further treatment if possible. For pain management he will continue with the current pain medication. Thank you for taking good care of Mr. Lowell Guitar, we will continue to follow-up the patient with you and assist in his management on as-needed basis. Disclaimer: This note was dictated with voice recognition software. Similar sounding words can inadvertently be transcribed and may be missed upon review. Lajuana Matte, MD

## 2023-06-04 NOTE — Telephone Encounter (Signed)
He is currently in the ED and is scheduled for injection today, scan tomorrow, and labs on Monday.  Could he get his labs tomorrow after his scan to avoid getting them on 12/23? Lorayne Marek, RN

## 2023-06-04 NOTE — ED Notes (Signed)
Ortho called and brace will be placed on day shift

## 2023-06-04 NOTE — Telephone Encounter (Signed)
He has been admitted to ICU-cancelled today's injection and Monday's lab. Lorayne Marek, RN

## 2023-06-04 NOTE — ED Notes (Signed)
Pt was able to ambulate to restroom with assistance. Another hat was placed in toilet to collect urine sample. Pt informed to leave it there an tech will collect when done. Once pt was done, tech noticed empty hat on the floor and toilet flushed. Pt had dumped urine in toilet again. Tech unable to collect sample.

## 2023-06-04 NOTE — Consult Note (Signed)
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  Upon presentation to the ER, patient receiving regiment for cancer associated pain and hypotension.  Imaging concerning for findings of peritoneal carcinomatosis, acute T9 and 10 pathologic fractures,and progression of the lower mediastinal soft tissue mass.  Oncology consulted for recommendations.  Palliative medicine team consulted to assist with complex medical decision making.  Extensive review of EMR prior to presenting to bedside.  As per EMR review, patient and family have already discussed CODE STATUS with multiple providers.  Patient expressing wishes for DNR/DNI though would accept transfer to ICU for hypotension support with pressors.  Continuing appropriate medical interventions at this time.  Presented to bedside to see patient in the ER.  Multiple family members at bedside including patient's wife, daughter, and son-in-law.  With permission able to engage in discussion.  Introduced myself and the role of the palliative medicine team and patient's medical care.  Family expressed concerns about palliative meaning hospice care as they were not ready for that to which spent time clarifying similarities and differences between palliative medicine and hospice.  Discussed that patient can  receive palliative medicine while seeking further cancer directed therapies and aggressive medical management.  Family acknowledged this.    Spent time discussing patient's symptom burden at this time.  Patient feels that his abdominal pain has been improved since admission.  Patient feels that his rectal pain was being well-managed by his chronic pain management physician.  Reviewed home medications which includes Xtampza 27 mg twice daily, oxycodone 15 mg every 6 hours as needed, Lyrica 200 mg 3 times daily, and gabapentin 800 mg nightly.  Patient and family confirmed these medication doses and frequencies.  They also discussed that patient was started on mesalamine 1000 mg suppository nightly and nifedipine 0.2%/hydrocortisone 2.5% cream twice daily.  Has chronic pain management provider.  While present at bedside, wife contacted pain management provider, Dr. Gwendel Hanson, and asked this provider to speak with him.  Again discussed acute pain management while here in the hospital.  Discussed plan to put patient back on home pain regimen dose.  Dr. Emeline Darling acknowledged this.  Dr. Emeline Darling stated that his office contact information will be [phone] if further communication is needed.  All questions answered at that time.  Noted palliative medicine team and continue to follow with patient's medical journey.  Discussed care with IDT including pharmacy for coordination of medications.  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   OA (osteoarthritis)    Rash    10-15-2022  per pt due to chemo   Shortness of breath dyspnea    just initially   Vitamin D deficiency    Wears hearing aid in both ears    Social History   Socioeconomic History   Marital status: Married    Spouse name: Aurther Loft   Number of children: 3   Years of education: Not on file   Highest education level: Not on file  Occupational History   Not on file  Tobacco Use   Smoking status: Never   Smokeless tobacco: Former    Types: Chew    Quit date: 1978  Vaping Use   Vaping status: Never Used  Substance and Sexual Activity   Alcohol use: Not Currently    Comment: occasional (2-3 per month)   Drug use: Not Currently    Types: Marijuana    Comment: 10-15-2022  per pt used decade ago   Sexual activity: Yes  Other Topics Concern   Not on file  Social History Narrative   Not on file   Social Drivers of Health   Financial Resource Strain: Low Risk  (03/17/2022)   Received from [facility], [facility]   Overall Financial Resource Strain (CARDIA)    Difficulty of Paying Living Expenses: Not very hard  Food Insecurity: No Food Insecurity (03/05/2023)   Hunger Vital Sign    Worried About Running Out of Food in the Last Year: Never true    Ran Out of Food in the Last Year: Never true  Transportation Needs: No Transportation Needs (03/05/2023)   PRAPARE - Administrator, Civil Service (Medical): No    Lack of Transportation (Non-Medical): No  Physical Activity: Not on file  Stress: Not on file   Social Connections: Not on file   Family History  Problem Relation Age of Onset   Diabetes type II Mother    Hypertension Father    Scheduled Meds:  midodrine  5 mg Oral TID WC   Continuous Infusions:  sodium chloride 75 mL/hr at 06/04/23 0210   PRN Meds:.acetaminophen **OR** acetaminophen, fentaNYL (SUBLIMAZE) injection, HYDROcodone-acetaminophen, ondansetron **OR** ondansetron (ZOFRAN) IV Allergies  Allergen Reactions   Aspirin Shortness Of Breath    Other Reaction(s): Wheezing  Other Reaction(s): Unknown   Penicillins Rash    Has patient had a PCN reaction causing immediate rash, facial/tongue/throat swelling, SOB or lightheadedness with hypotension:YES Has patient had a PCN reaction causing severe rash involving mucus membranes or skin necrosis: NO Has patient had a PCN reaction that required hospitalization NO Has patient had a PCN reaction occurring within the last 10 years: NO If all of the above answers are "NO", then may proceed with Cephalosporin use.   CBC:    Component Value Date/Time   WBC 10.4 06/04/2023 0500   HGB 7.8 (L) 06/04/2023 0500  HGB 8.3 (L) 06/02/2023 0926   HCT 27.1 (L) 06/04/2023 0500   PLT 222 06/04/2023 0500   PLT 324 06/02/2023 0926   MCV 93.8 06/04/2023 0500   NEUTROABS 9.1 (H) 06/03/2023 1700   LYMPHSABS 0.2 (L) 06/03/2023 1700   MONOABS 0.5 06/03/2023 1700   EOSABS 0.0 06/03/2023 1700   BASOSABS 0.0 06/03/2023 1700   Comprehensive Metabolic Panel:    Component Value Date/Time   NA 140 06/04/2023 0500   K 3.8 06/04/2023 0500   CL 102 06/04/2023 0500   CO2 29 06/04/2023 0500   BUN 23 06/04/2023 0500   CREATININE 0.88 06/04/2023 0500   CREATININE 0.74 06/02/2023 0926   GLUCOSE 116 (H) 06/04/2023 0500   CALCIUM 8.8 (L) 06/04/2023 0500   AST 20 06/04/2023 0500   AST 15 06/02/2023 0926   ALT 12 06/04/2023 0500   ALT 8 06/02/2023 0926   ALKPHOS 131 (H) 06/04/2023 0500   BILITOT 0.6 06/04/2023 0500   BILITOT 0.4 06/02/2023 0926    PROT 5.4 (L) 06/04/2023 0500   ALBUMIN 2.1 (L) 06/04/2023 0500    Physical Exam: Vital Signs: BP 100/69 (BP Location: Right Arm)   Pulse 95   Temp 98.6 F (37 C)   Resp 18   Ht 5\' 9"  (1.753 m)   SpO2 100%   BMI 23.55 kg/m  SpO2: SpO2: 100 % O2 Device: O2 Device: Nasal Cannula O2 Flow Rate: O2 Flow Rate (L/min): 2 L/min Intake/output summary:  Intake/Output Summary (Last 24 hours) at 06/04/2023 0941 Last data filed at 06/04/2023 0458 Gross per 24 hour  Intake 253.49 ml  Output --  Net 253.49 ml   LBM:   Baseline Weight:   Most recent weight:    General: NAD, alert, chronically ill-appearing, laying in bed Cardiovascular: RRR Respiratory: no increased work of breathing noted, not in respiratory distress Neuro: A&Ox4, following commands easily Psych: appropriately answers all questions          Palliative Performance Scale: 60%              Additional Data Reviewed: Recent Labs    06/03/23 1700 06/04/23 0500  WBC 9.9 10.4  HGB 10.1* 7.8*  PLT 250 222  NA 138 140  BUN 19 23  CREATININE 0.78 0.88    Imaging: DG CHEST PORT 1 VIEW CLINICAL DATA:  Hypoxia  EXAM: PORTABLE CHEST 1 VIEW  COMPARISON:  Chest x-ray 02/23/2022.  CT chest 03/20/2023.  FINDINGS: There is new right lateral pleural thickening or loculated fluid. Right basilar opacities appear similar to prior. The cardiomediastinal silhouette is stable. Left lung is clear. There is no pneumothorax. Right chest port catheter tip ends in the distal SVC. No acute fractures are identified.  IMPRESSION: 1. New right lateral pleural thickening or loculated fluid. 2. Right basilar opacities appear similar to prior.  Electronically Signed   By: Darliss Cheney M.D.   On: 06/03/2023 22:57 CT ABDOMEN PELVIS W CONTRAST CLINICAL DATA:  Acute abdominal pain. Undergoing chemotherapy. Prostate cancer.  EXAM: CT ABDOMEN AND PELVIS WITH CONTRAST  TECHNIQUE: Multidetector CT imaging of the abdomen and  pelvis was performed using the standard protocol following bolus administration of intravenous contrast.  RADIATION DOSE REDUCTION: This exam was performed according to the departmental dose-optimization program which includes automated exposure control, adjustment of the mA and/or kV according to patient size and/or use of iterative reconstruction technique.  CONTRAST:  OMNIPAQUE IOHEXOL 300 MG/ML  SOLN  COMPARISON:  CT chest abdomen and pelvis  03/20/2023  FINDINGS: Lower chest: Multiple soft tissue tumors occupying the lower right hemithorax extending through the diaphragm. Largest measures 8.2 x 11.9 cm image 2/16. This is not significantly changed. There is soft tissue mass in the lower mediastinum the right of the esophagus at the level of the diaphragm measuring 7.9 x 11.6 cm which has significantly increased in size (previously 3.8 by 4.4 cm).  Hepatobiliary: There is a stable left hepatic mass measuring 2.9 cm. No definite new hepatic masses are identified. No biliary ductal dilatation. The gallbladder is within normal limits.  Pancreas: Unremarkable. No pancreatic ductal dilatation or surrounding inflammatory changes.  Spleen: Normal in size without focal abnormality.  Adrenals/Urinary Tract: There are 2 cysts in the right kidney measuring up to 2.6 cm. There are punctate nonobstructing left renal calculi. Cortical hypodensities in the left kidney are too small to characterize, also likely cysts. No hydronephrosis identified. The right adrenal gland is not well defined. The left adrenal gland is within normal limits. Bladder is within normal limits.  Stomach/Bowel: Stomach is within normal limits. Appendix appears normal. No evidence of bowel wall thickening, distention, or inflammatory changes.  Vascular/Lymphatic: No significant vascular findings are present. No enlarged abdominal or pelvic lymph nodes.  Reproductive: Prostate radiotherapy seeds are  present.  Other: There is new small volume ascites throughout the abdomen and pelvis with some smooth peritoneal enhancement in the right paracolic gutter and pelvis. There is diffuse body wall edema which is new from prior.  Musculoskeletal: There are new pathologic fractures of the lateral aspects of T9 and T10. Multilevel degenerative changes affect the spine.  IMPRESSION: 1. New small volume ascites throughout the abdomen and pelvis with some smooth peritoneal enhancement in the right paracolic gutter and pelvis. 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 2. New pathologic fractures of the lateral aspects of T9 and T10. 3. Multiple soft tissue tumors occupying the lower right hemithorax extending through the diaphragm, grossly unchanged, but incompletely evaluated. 4. Stable left hepatic mass. 5. Lower mediastinal soft tissue mass has significantly increased in size. 6. New diffuse body wall edema. 7. Nonobstructing left renal calculi.  Electronically Signed   By: Darliss Cheney M.D.   On: 06/03/2023 20:12    I personally reviewed recent imaging.   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  Upon presentation to the ER, patient receiving regiment for cancer associated pain and hypotension.  Imaging concerning for findings of peritoneal carcinomatosis, acute T9 and 10 pathologic fractures,and progression of the lower mediastinal soft tissue mass.  Oncology consulted for recommendations.  Palliative  medicine team consulted to assist with complex medical decision making.  # Complex medical decision making/goals of care  -Patient and family have already discussed with providers that they would be willing for patient to receive medical management in the ICU including pressor support.  Wanting continue appropriate medical interventions at this time.  Patient has stated DNR/DNI status.  -  Code Status: Limited: Do not attempt resuscitation (DNR) -DNR-LIMITED -Do Not Intubate/DNI    # Symptom management  -Pain, acute on chronic in  setting of metastatic lung cancer with disease progression seen on imaging as well as pathologic fracture of T9/T10 Discussed care with patient chronic pain management doctor, Dr. Gwendel Hanson, as requested by family at bedside.  Noted will plan to restart patient on home medication doses and as was managing patient's rectal pain well as per patient.  Patient's abdominal pain was acute and has already improved as per patient's report.   Starting reported home medications of:    -Oxycodone 15 mg every 6 hours as needed    -Lyrica 200 mg 3 times daily    -Gabapentin 800 mg nightly    -Mesalamine 100 mg suppository nightly    -Nifedipine 0.2%/hydrocortisone 2.5% cream twice daily (wife to provide if unavailable through pharmacy)   -Unfortunately pharmacy does not carry patient's home medication of Xtampza 27mg  BID so will instead start OxyContin 30mg  BID (if patient wants to take Xtampza, family would need to bring in home medication)    -Has IV fentanyl 12.5-50 mcg every 2 hours as needed for breakthrough pain  # Psycho-social/Spiritual Support:  - Support System: wife, daughter, son-in-law  # Discharge Planning:  To Be Determined  Thank you for allowing the palliative care team to participate in the care [name].  Alvester Morin, DO Palliative Care Provider PMT # [phone]  If patient remains symptomatic despite maximum doses, please call PMT at  [phone] between 0700 and 1900. Outside of these hours, please call attending, as PMT does not have night coverage.  Personally spent 84 minutes in patient care including extensive chart review (labs, imaging, progress/consult notes, vital signs), medically appropraite exam, discussed with treatment team, education to patient, family, and staff, documenting clinical information, medication review and management, coordination of care, and available advanced directive documents.   *Please note that this is a verbal dictation therefore any spelling or grammatical errors are due to the "[employer]" system interpretation.

## 2023-06-05 ENCOUNTER — Inpatient Hospital Stay (HOSPITAL_COMMUNITY): Payer: BC Managed Care – PPO

## 2023-06-05 ENCOUNTER — Encounter (HOSPITAL_COMMUNITY): Payer: Self-pay

## 2023-06-05 ENCOUNTER — Ambulatory Visit (HOSPITAL_COMMUNITY): Payer: BC Managed Care – PPO

## 2023-06-05 DIAGNOSIS — G893 Neoplasm related pain (acute) (chronic): Secondary | ICD-10-CM | POA: Diagnosis not present

## 2023-06-05 DIAGNOSIS — C3491 Malignant neoplasm of unspecified part of right bronchus or lung: Secondary | ICD-10-CM | POA: Diagnosis not present

## 2023-06-05 DIAGNOSIS — Z79899 Other long term (current) drug therapy: Secondary | ICD-10-CM

## 2023-06-05 DIAGNOSIS — Z515 Encounter for palliative care: Secondary | ICD-10-CM | POA: Diagnosis not present

## 2023-06-05 DIAGNOSIS — C7951 Secondary malignant neoplasm of bone: Secondary | ICD-10-CM | POA: Diagnosis not present

## 2023-06-05 DIAGNOSIS — C61 Malignant neoplasm of prostate: Secondary | ICD-10-CM | POA: Diagnosis not present

## 2023-06-05 LAB — BASIC METABOLIC PANEL
Anion gap: 8 (ref 5–15)
BUN: 22 mg/dL (ref 8–23)
CO2: 29 mmol/L (ref 22–32)
Calcium: 8.6 mg/dL — ABNORMAL LOW (ref 8.9–10.3)
Chloride: 102 mmol/L (ref 98–111)
Creatinine, Ser: 0.52 mg/dL — ABNORMAL LOW (ref 0.61–1.24)
GFR, Estimated: >60 mL/min (ref >60)
Glucose, Bld: 110 mg/dL — ABNORMAL HIGH (ref 70–99)
Potassium: 4.1 mmol/L (ref 3.5–5.1)
Sodium: 139 mmol/L (ref 135–145)

## 2023-06-05 LAB — CBC
HCT: 26.1 % — ABNORMAL LOW (ref 39.0–52.0)
Hemoglobin: 7.3 g/dL — ABNORMAL LOW (ref 13.0–17.0)
MCH: 26.6 pg (ref 26.0–34.0)
MCHC: 28 g/dL — ABNORMAL LOW (ref 30.0–36.0)
MCV: 95.3 fL (ref 80.0–100.0)
Platelets: 190 10*3/uL (ref 150–400)
RBC: 2.74 MIL/uL — ABNORMAL LOW (ref 4.22–5.81)
RDW: 18.5 % — ABNORMAL HIGH (ref 11.5–15.5)
WBC: 9.7 10*3/uL (ref 4.0–10.5)
nRBC: 0.2 % (ref 0.0–0.2)

## 2023-06-05 LAB — PREPARE RBC (CROSSMATCH)

## 2023-06-05 MED ORDER — SENNOSIDES-DOCUSATE SODIUM 8.6-50 MG PO TABS
1.0000 | ORAL_TABLET | Freq: Two times a day (BID) | ORAL | Status: DC
  Administered 2023-06-05: 1 via ORAL
  Filled 2023-06-05: qty 1

## 2023-06-05 MED ORDER — MESALAMINE 1000 MG RE SUPP: 1000.0000 mg | Freq: Every day | RECTAL | 12 refills | Status: DC

## 2023-06-05 MED ORDER — AZITHROMYCIN 250 MG PO TABS: 250.0000 mg | ORAL_TABLET | Freq: Every day | ORAL | 0 refills | Status: DC

## 2023-06-05 MED ORDER — CEFDINIR 300 MG PO CAPS: 300.0000 mg | ORAL_CAPSULE | Freq: Two times a day (BID) | ORAL | 0 refills | Status: AC

## 2023-06-05 MED ORDER — ORAL CARE MOUTH RINSE: 15.0000 mL | OROMUCOSAL | Status: DC | PRN

## 2023-06-05 MED ORDER — SODIUM CHLORIDE 0.9% IV SOLUTION: Freq: Once | INTRAVENOUS | Status: DC

## 2023-06-05 MED ORDER — AZITHROMYCIN 250 MG PO TABS: 500.0000 mg | ORAL_TABLET | Freq: Every day | ORAL | Status: DC

## 2023-06-05 MED ORDER — HEPARIN SOD (PORK) LOCK FLUSH 100 UNIT/ML IV SOLN
500.0000 [IU] | INTRAVENOUS | Status: AC | PRN
  Administered 2023-06-05: 500 [IU]

## 2023-06-05 MED ORDER — LIDOCAINE HCL 1 % IJ SOLN
INTRAMUSCULAR | Status: AC
  Filled 2023-06-05: qty 20

## 2023-06-05 MED ORDER — MIDODRINE HCL 5 MG PO TABS: 5.0000 mg | ORAL_TABLET | Freq: Three times a day (TID) | ORAL | 0 refills | Status: DC

## 2023-06-05 NOTE — Progress Notes (Signed)
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   PRIOR THERAPY: 1) status post resection and the patient has recurrence and October 2021 status post resection again under the care of Dr. Dorris Fetch. 2) Sunitinib 3 7.5 mg p.o. daily.  First dose started April 08, 2021.  Status post 3 months of treatment.  This treatment was discontinued secondary to disease progression. 3) Votrient (pazopanib) 800 mg p.o. daily.  Started August 10, 2021.  Status post 2 months of treatment discontinued secondary to disease progression. 4) status post redo right thoracotomy with resection of pleural tumors and lymph node dissection under the care of Dr. Dorris Fetch on Nov 13, 2021. 5) Systemic chemotherapy with doxorubicin 25 Mg/M2, ifosfamide 2500 Mg/M2 and mesna 2500 Mg/M2 days 1-3 every 3 weeks.  First dose February 12, 2022 at [facility] center.  Status post 2 cycles of treatment discontinued secondary to disease progression. 6) Radiation to the prostate cancer, under the care of Dr. Kathrynn Running. Last day of radiation on 12/17/22 7) Systemic chemotherapy with Temodar 200 Mg/M2 on days 1-7 and day 15-21 with a Avastin 5 Mg/KG on days 8 and 22 every 4 weeks.  Status post 11 cycles . Discontinued due to disease progression   CURRENT THERAPY: Palliative systemic chemotherapy with gemcitabine 900 mg/m on days 1 and 8 and docetaxel 75 mg/m on day 8 IV every 3 weeks.  He will receive Neulasta support on day 9.  He is status post32 cycles.  Subjective: The patient is seen and evaluated today.  His wife and daughter were at the bedside.  I had a lengthy discussion with them today about his current condition and the recent CT scan of the chest results.  He is feeling a little bit better today with less distention in the abdomen and less pain in the chest.  He  denied having any current nausea or vomiting.  He continues to have shortness of breath.  Objective: Vital signs in last 24 hours: Temp:  [97.2 F (36.2 C)-98.1 F (36.7 C)] 97.2 F (36.2 C) (12/20 1532) Pulse Rate:  [83-100] 89 (12/20 1532) Resp:  [8-23] 18 (12/20 1532) BP: (102-145)/(56-90) 115/62 (12/20 1532) SpO2:  [92 %-100 %] 100 % (12/20 1515)  Intake/Output from previous day: 12/19 0701 - 12/20 0700 In: 671.2 [P.O.:270; I.V.:48.3; IV Piggyback:352.8] Out: -  Intake/Output this shift: Total I/O In: 10 [I.V.:10] Out: -   General appearance: alert, cooperative, fatigued, and no distress Resp: diminished breath sounds RLL and RML and dullness to percussion RLL and RML Cardio: regular rate and rhythm, S1, S2 normal, no murmur, click, rub or gallop GI: soft, non-tender; bowel sounds normal; no masses,  no organomegaly Extremities: extremities normal, atraumatic, no cyanosis or edema  Lab Results:  Recent Labs    06/04/23 0500 06/05/23 0303  WBC 10.4 9.7  HGB 7.8* 7.3*  HCT 27.1* 26.1*  PLT 222 190   BMET Recent Labs    06/04/23 0500 06/05/23 0303  NA 140 139  K 3.8 4.1  CL 102 102  CO2 29 29  GLUCOSE 116* 110*  BUN 23 22  CREATININE 0.88 0.52*  CALCIUM 8.8* 8.6*    Studies/Results: CT CHEST W CONTRAST Result Date: 06/05/2023 CLINICAL DATA:  Pulmonary nodule. 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 * Tracking Code: BO *  EXAM: CT CHEST WITH CONTRAST TECHNIQUE: Multidetector CT imaging of the chest was performed during intravenous contrast administration. RADIATION DOSE REDUCTION: This exam was performed according to the departmental dose-optimization program which includes automated exposure control, adjustment of the mA and/or kV according to patient size and/or use of iterative reconstruction technique. CONTRAST:  75mL OMNIPAQUE IOHEXOL 300 MG/ML  SOLN COMPARISON:  03/20/2023 FINDINGS:  Cardiovascular: Mild coronary artery calcification. Global cardiac size within normal limits. Marked enlargement of the posterior mediastinal metastatic mass now demonstrating significant mass effect upon the base of the heart posteriorly with displacement of the heart antral laterally to the left. Significant mass effect upon the inferior cavoatrial junction and coronary sinus which is not well evaluated due to the relatively early phase contrast enhancement. No pericardial effusion. Central pulmonary arteries are of normal caliber. Thoracic aorta is unremarkable. Right internal jugular chest port tip seen at the superior right atrium. Mediastinum/Nodes: Visualized thyroid is unremarkable. The right 3 vascular mass has markedly increased in size now measuring 6.0 x 6.8 cm at axial image # 62/2, previously measuring 3.3 x 4.0 cm when measured in similar fashion. Additional malignant masses within the anterior mediastinum have uniformly increased in size and become more confluent within the right cardiophrenic angle. As noted above, the dominant mass within the posterior mediastinum has markedly increased in size now measuring 7.3 x 12.0 cm, previously measuring roughly 3.6 x 4.6 cm. Lungs/Pleura: Confluent pleural in chest wall masses within the inferior right hemithorax have all uniformly increased in size and there is now near complete collapse of the right lower lobe and compressive atelectasis of the right upper lobe. Small right loculated pleural effusion noted. Left lung is clear. No pneumothorax. No central obstructing lesion. Upper Abdomen: Ascites has developed throughout the visualized upper abdomen. Numerous malignant masses within the right chest wall and pleural space demonstrate increasing mass effect upon the liver which is displaced medially. Musculoskeletal: No acute bone abnormality. Malignant destruction of the right ninth and tenth ribs laterally has developed, new since prior examination.  Extension of the extensive malignant disease into the right chest wall again noted. Increasing diffuse subcutaneous body wall edema. IMPRESSION: 1. Marked interval progression of disease. 2. Marked enlargement of the posterior mediastinal metastatic mass now demonstrating significant mass effect upon the base of the heart posteriorly with displacement of the heart anterolaterally to the left. Significant mass effect upon the inferior cavoatrial junction and coronary sinus which is not well evaluated due to the relatively early phase contrast enhancement. 3. Marked interval increase in size of the confluent pleural and chest wall masses within the inferior right hemithorax with now near complete collapse of the right lower lobe and compressive atelectasis of the right upper lobe. Small right loculated pleural effusion. 4. Interval development of malignant destruction of the right ninth and tenth ribs laterally. 5. Interval development of ascites throughout the visualized upper abdomen. Increasing diffuse subcutaneous body wall edema. 6. Mild coronary artery calcification. Electronically Signed   By: Helyn Numbers M.D.   On: 06/05/2023 02:00   US Abdomen Limited Result Date: 06/04/2023 CLINICAL DATA:  Ascites EXAM: LIMITED ABDOMEN ULTRASOUND FOR ASCITES TECHNIQUE: Limited ultrasound survey for ascites was performed in all four abdominal quadrants. COMPARISON:  CT abdomen pelvis, 06/03/2023 FINDINGS: Small volume ascites throughout the abdomen and pelvis. Maximum pocket depth on provided images 4.8 cm in the right lower quadrant. IMPRESSION: Small volume ascites throughout the abdomen and pelvis. Maximum pocket depth on provided images 4.8 cm in the  right lower quadrant. Electronically Signed   By: Jearld Lesch M.D.   On: 06/04/2023 11:34   DG CHEST PORT 1 VIEW Result Date: 06/03/2023 CLINICAL DATA:  Hypoxia EXAM: PORTABLE CHEST 1 VIEW COMPARISON:  Chest x-ray 02/23/2022.  CT chest 03/20/2023. FINDINGS: There  is new right lateral pleural thickening or loculated fluid. Right basilar opacities appear similar to prior. The cardiomediastinal silhouette is stable. Left lung is clear. There is no pneumothorax. Right chest port catheter tip ends in the distal SVC. No acute fractures are identified. IMPRESSION: 1. New right lateral pleural thickening or loculated fluid. 2. Right basilar opacities appear similar to prior. Electronically Signed   By: Darliss Cheney M.D.   On: 06/03/2023 22:57   CT ABDOMEN PELVIS W CONTRAST Result Date: 06/03/2023 CLINICAL DATA:  Acute abdominal pain. Undergoing chemotherapy. Prostate cancer. EXAM: CT ABDOMEN AND PELVIS WITH CONTRAST TECHNIQUE: Multidetector CT imaging of the abdomen and pelvis was performed using the standard protocol following bolus administration of intravenous contrast. RADIATION DOSE REDUCTION: This exam was performed according to the departmental dose-optimization program which includes automated exposure control, adjustment of the mA and/or kV according to patient size and/or use of iterative reconstruction technique. CONTRAST:  OMNIPAQUE IOHEXOL 300 MG/ML  SOLN COMPARISON:  CT chest abdomen and pelvis 03/20/2023 FINDINGS: Lower chest: Multiple soft tissue tumors occupying the lower right hemithorax extending through the diaphragm. Largest measures 8.2 x 11.9 cm image 2/16. This is not significantly changed. There is soft tissue mass in the lower mediastinum the right of the esophagus at the level of the diaphragm measuring 7.9 x 11.6 cm which has significantly increased in size (previously 3.8 by 4.4 cm). Hepatobiliary: There is a stable left hepatic mass measuring 2.9 cm. No definite new hepatic masses are identified. No biliary ductal dilatation. The gallbladder is within normal limits. Pancreas: Unremarkable. No pancreatic ductal dilatation or surrounding inflammatory changes. Spleen: Normal in size without focal abnormality. Adrenals/Urinary Tract: There are 2  cysts in the right kidney measuring up to 2.6 cm. There are punctate nonobstructing left renal calculi. Cortical hypodensities in the left kidney are too small to characterize, also likely cysts. No hydronephrosis identified. The right adrenal gland is not well defined. The left adrenal gland is within normal limits. Bladder is within normal limits. Stomach/Bowel: Stomach is within normal limits. Appendix appears normal. No evidence of bowel wall thickening, distention, or inflammatory changes. Vascular/Lymphatic: No significant vascular findings are present. No enlarged abdominal or pelvic lymph nodes. Reproductive: Prostate radiotherapy seeds are present. Other: There is new small volume ascites throughout the abdomen and pelvis with some smooth peritoneal enhancement in the right paracolic gutter and pelvis. There is diffuse body wall edema which is new from prior. Musculoskeletal: There are new pathologic fractures of the lateral aspects of T9 and T10. Multilevel degenerative changes affect the spine. IMPRESSION: 1. New small volume ascites throughout the abdomen and pelvis with some smooth peritoneal enhancement in the right paracolic gutter and pelvis. 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 2. New pathologic fractures of the lateral aspects of T9 and T10. 3. Multiple soft tissue tumors occupying the lower right hemithorax extending through the diaphragm, grossly unchanged, but incompletely evaluated. 4. Stable left hepatic mass. 5. Lower mediastinal soft tissue mass has significantly increased in size. 6. New diffuse body wall edema. 7. Nonobstructing left renal calculi. Electronically Signed   By: Darliss Cheney M.D.   On: 06/03/2023 20:12    Medications: I have reviewed the patient's current medications.  Assessment/Plan: This is a very pleasant 62 years old African-American male diagnosed with recurrent solitary fibrous tumor of the right lung in June 2017 status post multiple  treatment including several surgeries for resection of his tumor as well as palliative radiation. He was also treated with sunitinib then pazopanib with disease progression. The patient was then referred to [facility] cancer center and seen by Dr. Waymon Amato who recommended for him treatment with doxorubicin ifosfamide and mesna in 2023 discontinued secondary to disease progression. He was then treated with Temodar and Avastin again discontinued secondary to disease progression. Recently he has been on systemic treatment with gemcitabine and docetaxel with Neulasta support status post 3 cycles. He was supposed to have restaging scan tomorrow but the patient has been complaining of increasing abdominal pain and distention. He presented to the emergency department for evaluation and imaging studies including CT scan of the abdomen and pelvis showed new small volume ascites throughout the abdomen and pelvis with some smooth peritoneal enhancement in the right paracolic gutter and pelvis. 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 There was also a new pathologic fractures of the lateral aspect of T9 and T10 with multiple soft tissue tumor occupying the lower right hemothorax extending through the diaphragm but unchanged. There was also stable left hepatic mass and lower mediastinal soft tissue mass significantly increased in size with new diffuse body wall edema.  CT scan of the chest performed yesterday showed marked interval progression of disease with marked enlargement of the posterior mediastinal metastatic mass now demonstrated significant mass effect upon the base of the heart posteriorly with displacement of the heart anterolaterally to the left with significant mass effect upon the inferior cavoatrial junction and coronary sinus.  There was marked interval increase of the confluent pleural and chest wall masses within the inferior right hemothorax with now near complete collapse of the right  lower lobe and compressive atelectasis of the right upper lobe with a small right pleural loculated effusion.  There was also interval development of malignant destruction of the right ninth and 10th ribs bilaterally and interval development of ascites throughout the visualized upper abdomen with increasing diffuse subcutaneous body wall edema. I had a lengthy discussion with the patient and his family about his current condition.  Unfortunately the patient has very poor prognosis with the current state of his disease.  I strongly encouraged him to consider palliative care and hospice.  I doubt any future treatment will be of value for his condition. The patient and his family would like to take him home with hospice care. Thank you so much for taking good care of Mr. Rumsey.  Please call if I can help in his management in any way.    LOS: 2 days    Lajuana Matte 06/05/2023

## 2023-06-05 NOTE — Discharge Summary (Addendum)
Physician Discharge Summary   Patient: Mathew [name] MRN: [id number] DOB: [birth year]  Admit date:     06/03/2023  Discharge date: 06/05/23  Discharge Physician: Alba Cory   PCP: [name], MD   Recommendations at discharge:   Patient will be discharge home with Hospice.  He will be discharge with Oxygen.    Discharge Diagnoses: Principal Problem:   Cancer associated pain Active Problems:   Hypertension   Anemia   Malignant neoplasm of prostate (HCC)   Anorectal fistula   Acute respiratory failure with hypoxia (HCC)   Anasarca   Malignant neoplasm of right lung (HCC)   Malignant neoplasm metastatic to bone (HCC)   High risk medication use   Counseling and coordination of care   Palliative care encounter   Medication management  Resolved Problems:   * No resolved hospital problems. 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   Evaluation in the ED CT abdomen and pelvis showed 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, new pathologic fracture T9-T10, left hepatic mass stable from previous, increasing lower mediastinal soft tissue mass, diffuse body wall edema.  Patient had low blood pressure and was started on midodrine.  He is DNR, but will agree with IV pressors.  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  Chest x-ray showed new right lateral pleural thickening or loculated fluid.  Dr. Vassie Loll review x-ray and he  report patient has pleural malignancy, less likely fluid no need for thoracentesis at this point. -Treated  with IV antibiotics, ceftriaxone, Zithromax. Will discharge him on Zithromax and omnicef for 5 days.  -CT chest showed significantly progression of Diseases. Marked enlargement of the posterior mediastinal metastatic mass now demonstrating significant mass effect upon the base of the heart posteriorly with displacement of the heart anterolaterally to the left. Significant mass effect upon the inferior cavoatrial junction and coronary sinus which is not well evaluated due to the relatively early phase contrast enhancement. Marked interval increase in size of the confluent pleural and chest wall masses within the inferior right hemithorax with now near complete collapse of the right lower lobe and compressive atelectasis of the right upper lobe. Small right loculated pleural effusion. Dr Arbutus Ped evaluated patient and recommend Home with Hospice.  CM consulted, hospice referral. Wife and daughter are ok with hospice referral.  He will need home oxygen.   2-Ascites, abdominal distention: Present with nausea vomiting, worsening abdominal distention and pain: -CT: with small amount of ascites fluid.  Finding worrisome for peritoneal carcinomatosis -Concern for malignant ascites -Limited ultrasound ordered, will ask IR to evaluate for paracentesis   New T9 and T10 pathologic fracture: Cancer associated pain: Palliative care has been consulted for pain management Pain controlled.   Anemia of malignancy: Monitor hemoglobin transfuse for hemoglobin less than 7. Plan to proceed with one unit PRBC prior to discharge home today.    Rectal fistula: Continue with supportive care, resume mesalamine, steroid and nifedipine   Hypertension: Blood pressure was low holding blood pressure medication   Hypotension:  Started on low-dose midodrine, added IV antibiotics. Stable.         Consultants: Dr.   Arbutus Ped, Palliative care Procedures performed: [place] Disposition: Home Diet recommendation:  Discharge Diet Orders (From admission, onward)     Start     Ordered   06/05/23 0000  Diet - low sodium heart healthy        06/05/23 1502           Regular diet DISCHARGE MEDICATION: Allergies as of 06/05/2023       Reactions   Aspirin Shortness Of Breath   Other Reaction(s): Wheezing Other Reaction(s): Unknown   Penicillins Rash   Has patient had a PCN reaction causing immediate rash, facial/tongue/throat swelling, SOB or lightheadedness with hypotension:YES Has patient had a PCN reaction causing severe rash involving mucus membranes or skin necrosis: NO Has patient had a PCN reaction that required hospitalization NO Has patient had a PCN reaction occurring within the last 10 years: NO If all of the above answers are "NO", then may proceed with Cephalosporin use.        Medication List     TAKE these medications    albuterol 108 (90 Base) MCG/ACT inhaler Commonly known as: VENTOLIN HFA Inhale 1-2 puffs into the lungs every 6 (six) hours as needed for shortness of breath or wheezing.   azithromycin 250 MG tablet Commonly known as: ZITHROMAX Take 1 tablet (250 mg total) by mouth daily.   cefdinir 300 MG capsule Commonly known as: OMNICEF Take 1 capsule (300 mg total) by mouth 2 (two) times daily for 5 days.   dexamethasone 4 MG tablet Commonly known as: DECADRON Take 2 tabs by mouth 2 times daily starting day before chemo. Then take 2 tabs daily for 2 days starting day after chemo. Take with food. What changed:  how much to take how to take this when to take this   docusate sodium 100 MG capsule Commonly known as: COLACE Take 1 capsule (100 mg total) by mouth every 12 (twelve) hours. What changed: when to take this   folic acid 400 MCG tablet Commonly known as: FOLVITE Take 400 mcg by mouth daily.   furosemide 20 MG tablet Commonly known as: LASIX TAKE 1  TABLET BY MOUTH EVERY DAY AS NEEDED What changed: reasons to take this   gabapentin 400 MG capsule Commonly known as: NEURONTIN Take 800 mg by mouth daily. What changed: Another medication with the same name was removed. Continue taking this medication, and follow the directions you see here.   mesalamine 1000 MG suppository Commonly known as: CANASA Place 1 suppository (1,000 mg total) rectally at bedtime.   midodrine 5 MG tablet Commonly known as: PROAMATINE Take 1 tablet (5 mg total) by mouth 3 (three) times daily with meals.   multivitamin capsule Take 1 capsule by mouth daily.   ondansetron 8 MG tablet Commonly known as: Zofran Take 1 tablet (8 mg total) by mouth every 8 (eight) hours as needed for nausea or vomiting.   oxyCODONE 15 MG immediate release tablet Commonly known as: ROXICODONE Take 1 tablet (15 mg total) by mouth every 6 (six) hours as needed for pain.   polyethylene glycol 17 g packet Commonly known as: MIRALAX / GLYCOLAX Take 17 g by mouth 2 (two) times daily.   pregabalin 200 MG capsule Commonly known as: LYRICA Take 200 mg by mouth in the morning, at noon, and at bedtime. What changed: Another medication with the same name was removed. Continue taking  this medication, and follow the directions you see here.   tamsulosin 0.4 MG Caps capsule Commonly known as: FLOMAX Take 0.4 mg by mouth at bedtime. Urology started 8/27   Xtampza ER 27 MG C12a Generic drug: oxyCODONE ER Take 1 capsule by mouth 2 (two) times daily.               Durable Medical Equipment  (From admission, onward)           Start     Ordered   06/05/23 1012  For home use only DME oxygen  Once       Question Answer Comment  Length of Need Lifetime   Mode or (Route) Nasal cannula   Liters per Minute 4   Frequency Continuous (stationary and portable oxygen unit needed)   Oxygen delivery system Gas      06/05/23 1011            Follow-up Information     [facility]  Follow up.   Contact information: [facility] [address], [address] [place], [place] [address] [phone]               Discharge Exam: Ceasar Mons Weights   06/04/23 1109  Weight: 70.6 kg   General; NAD, chronic ill appearing.   Condition at discharge: poor  The results of significant diagnostics from this hospitalization (including imaging, microbiology, ancillary and laboratory) are listed below for reference.   Imaging Studies: CT CHEST W CONTRAST Result Date: 06/05/2023 CLINICAL DATA:  Pulmonary nodule. 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 * Tracking Code: BO * EXAM: CT CHEST WITH CONTRAST TECHNIQUE: Multidetector CT imaging of the chest was performed during intravenous contrast administration. RADIATION DOSE REDUCTION: This exam was performed according to the departmental dose-optimization program which includes automated exposure control, adjustment of the mA and/or kV according to patient size and/or use of iterative reconstruction technique. CONTRAST:  75mL OMNIPAQUE IOHEXOL 300 MG/ML  SOLN COMPARISON:  03/20/2023 FINDINGS: Cardiovascular: Mild coronary artery calcification. Global cardiac size within normal limits. Marked enlargement of the posterior mediastinal metastatic mass now demonstrating significant mass effect upon the base of the heart posteriorly with displacement of the heart antral laterally to the left. Significant mass effect upon the inferior cavoatrial junction and coronary sinus which is not well evaluated due to the relatively early phase contrast enhancement. No pericardial effusion. Central pulmonary arteries are of normal caliber. Thoracic aorta is unremarkable. Right internal jugular chest port tip seen at the superior right atrium. Mediastinum/Nodes: Visualized thyroid is unremarkable. The right 3 vascular mass has markedly increased in size now measuring 6.0 x 6.8 cm at axial  image # 62/2, previously measuring 3.3 x 4.0 cm when measured in similar fashion. Additional malignant masses within the anterior mediastinum have uniformly increased in size and become more confluent within the right cardiophrenic angle. As noted above, the dominant mass within the posterior mediastinum has markedly increased in size now measuring 7.3 x 12.0 cm, previously measuring roughly 3.6 x 4.6 cm. Lungs/Pleura: Confluent pleural in chest wall masses within the inferior right hemithorax have all uniformly increased in size and there is now near complete collapse of the right lower lobe and compressive atelectasis of the right upper lobe. Small right loculated pleural effusion noted. Left lung is clear. No pneumothorax. No central obstructing lesion. Upper Abdomen: Ascites has developed throughout the visualized upper abdomen. Numerous malignant masses within the right chest wall and pleural space  demonstrate increasing mass effect upon the liver which is displaced medially. Musculoskeletal: No acute bone abnormality. Malignant destruction of the right ninth and tenth ribs laterally has developed, new since prior examination. Extension of the extensive malignant disease into the right chest wall again noted. Increasing diffuse subcutaneous body wall edema. IMPRESSION: 1. Marked interval progression of disease. 2. Marked enlargement of the posterior mediastinal metastatic mass now demonstrating significant mass effect upon the base of the heart posteriorly with displacement of the heart anterolaterally to the left. Significant mass effect upon the inferior cavoatrial junction and coronary sinus which is not well evaluated due to the relatively early phase contrast enhancement. 3. Marked interval increase in size of the confluent pleural and chest wall masses within the inferior right hemithorax with now near complete collapse of the right lower lobe and compressive atelectasis of the right upper lobe. Small  right loculated pleural effusion. 4. Interval development of malignant destruction of the right ninth and tenth ribs laterally. 5. Interval development of ascites throughout the visualized upper abdomen. Increasing diffuse subcutaneous body wall edema. 6. Mild coronary artery calcification. Electronically Signed   By: Helyn Numbers M.D.   On: 06/05/2023 02:00   US Abdomen Limited Result Date: 06/04/2023 CLINICAL DATA:  Ascites EXAM: LIMITED ABDOMEN ULTRASOUND FOR ASCITES TECHNIQUE: Limited ultrasound survey for ascites was performed in all four abdominal quadrants. COMPARISON:  CT abdomen pelvis, 06/03/2023 FINDINGS: Small volume ascites throughout the abdomen and pelvis. Maximum pocket depth on provided images 4.8 cm in the right lower quadrant. IMPRESSION: Small volume ascites throughout the abdomen and pelvis. Maximum pocket depth on provided images 4.8 cm in the right lower quadrant. Electronically Signed   By: Jearld Lesch M.D.   On: 06/04/2023 11:34   DG CHEST PORT 1 VIEW Result Date: 06/03/2023 CLINICAL DATA:  Hypoxia EXAM: PORTABLE CHEST 1 VIEW COMPARISON:  Chest x-ray 02/23/2022.  CT chest 03/20/2023. FINDINGS: There is new right lateral pleural thickening or loculated fluid. Right basilar opacities appear similar to prior. The cardiomediastinal silhouette is stable. Left lung is clear. There is no pneumothorax. Right chest port catheter tip ends in the distal SVC. No acute fractures are identified. IMPRESSION: 1. New right lateral pleural thickening or loculated fluid. 2. Right basilar opacities appear similar to prior. Electronically Signed   By: Darliss Cheney M.D.   On: 06/03/2023 22:57   CT ABDOMEN PELVIS W CONTRAST Result Date: 06/03/2023 CLINICAL DATA:  Acute abdominal pain. Undergoing chemotherapy. Prostate cancer. EXAM: CT ABDOMEN AND PELVIS WITH CONTRAST TECHNIQUE: Multidetector CT imaging of the abdomen and pelvis was performed using the standard protocol following bolus  administration of intravenous contrast. RADIATION DOSE REDUCTION: This exam was performed according to the departmental dose-optimization program which includes automated exposure control, adjustment of the mA and/or kV according to patient size and/or use of iterative reconstruction technique. CONTRAST:  OMNIPAQUE IOHEXOL 300 MG/ML  SOLN COMPARISON:  CT chest abdomen and pelvis 03/20/2023 FINDINGS: Lower chest: Multiple soft tissue tumors occupying the lower right hemithorax extending through the diaphragm. Largest measures 8.2 x 11.9 cm image 2/16. This is not significantly changed. There is soft tissue mass in the lower mediastinum the right of the esophagus at the level of the diaphragm measuring 7.9 x 11.6 cm which has significantly increased in size (previously 3.8 by 4.4 cm). Hepatobiliary: There is a stable left hepatic mass measuring 2.9 cm. No definite new hepatic masses are identified. No biliary ductal dilatation. The gallbladder is within normal limits. Pancreas: Unremarkable.  No pancreatic ductal dilatation or surrounding inflammatory changes. Spleen: Normal in size without focal abnormality. Adrenals/Urinary Tract: There are 2 cysts in the right kidney measuring up to 2.6 cm. There are punctate nonobstructing left renal calculi. Cortical hypodensities in the left kidney are too small to characterize, also likely cysts. No hydronephrosis identified. The right adrenal gland is not well defined. The left adrenal gland is within normal limits. Bladder is within normal limits. Stomach/Bowel: Stomach is within normal limits. Appendix appears normal. No evidence of bowel wall thickening, distention, or inflammatory changes. Vascular/Lymphatic: No significant vascular findings are present. No enlarged abdominal or pelvic lymph nodes. Reproductive: Prostate radiotherapy seeds are present. Other: There is new small volume ascites throughout the abdomen and pelvis with some smooth peritoneal enhancement in  the right paracolic gutter and pelvis. There is diffuse body wall edema which is new from prior. Musculoskeletal: There are new pathologic fractures of the lateral aspects of T9 and T10. Multilevel degenerative changes affect the spine. IMPRESSION: 1. New small volume ascites throughout the abdomen and pelvis with some smooth peritoneal enhancement in the right paracolic gutter and pelvis. 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 2. New pathologic fractures of the lateral aspects of T9 and T10. 3. Multiple soft tissue tumors occupying the lower right hemithorax extending through the diaphragm, grossly unchanged, but incompletely evaluated. 4. Stable left hepatic mass. 5. Lower mediastinal soft tissue mass has significantly increased in size. 6. New diffuse body wall edema. 7. Nonobstructing left renal calculi. Electronically Signed   By: Darliss Cheney M.D.   On: 06/03/2023 20:12    Microbiology: Results for orders placed or performed during the hospital encounter of 06/03/23  MRSA Next Gen by PCR, Nasal     Status: None   Collection Time: 06/04/23 11:09 AM   Specimen: Nasal Mucosa; Nasal Swab  Result Value Ref Range Status   MRSA by PCR Next Gen NOT DETECTED NOT DETECTED Final    Comment: (NOTE) The GeneXpert MRSA Assay (FDA approved for NASAL specimens only), is one component of a comprehensive MRSA colonization surveillance program. It is not intended to diagnose MRSA infection nor to guide or monitor treatment for MRSA infections. Test performance is not FDA approved in patients less than 37 years old. Performed at [facility], [address] [place], [place] [address]     Labs: CBC: Recent Labs  Lab 06/02/23 0926 06/03/23 1700 06/04/23 0500 06/05/23 0303  WBC 19.5* 9.9 10.4 9.7  NEUTROABS 16.6* 9.1*  --   --   HGB 8.3* 10.1* 7.8* 7.3*  HCT 27.4* 33.3* 27.1* 26.1*  MCV 89.8 91.2 93.8 95.3  PLT 324 250 222 190   Basic Metabolic Panel: Recent  Labs  Lab 06/02/23 0926 06/03/23 1700 06/04/23 0500 06/05/23 0303  NA 141 138 140 139  K 3.8 3.8 3.8 4.1  CL 102 99 102 102  CO2 31 28 29 29   GLUCOSE 107* 118* 116* 110*  BUN 20 19 23 22   CREATININE 0.74 0.78 0.88 0.52*  CALCIUM 9.7 9.2 8.8* 8.6*  MG  --  2.1 1.9  --   PHOS  --  3.6 3.8  --    Liver Function Tests: Recent Labs  Lab 06/02/23 0926 06/03/23 1700 06/04/23 0500  AST 15 25 20   ALT 8 13 12   ALKPHOS 173* 173* 131*  BILITOT 0.4 0.6 0.6  PROT 6.1* 6.8 5.4*  ALBUMIN 3.0* 2.6* 2.1*   CBG: No results for input(s): "GLUCAP" in the last  168 hours.  Discharge time spent: greater than 30 minutes.  Signed: Alba Cory, MD [facility] 06/05/2023

## 2023-06-05 NOTE — Progress Notes (Signed)
SATURATION QUALIFICATIONS: (This note is used to comply with regulatory documentation for home oxygen)  Patient Saturations on Room Air at Rest = 85%  Patient Saturations on Room Air while Ambulating = 82%  Patient Saturations on 2 Liters of oxygen while Ambulating = 90%  Please briefly explain why patient needs home oxygen: Increased work of breathing and shortness of breath due to advancement of disease progress. 06/05/23  3:27 PM Elmer Bales, RN

## 2023-06-05 NOTE — Plan of Care (Signed)

## 2023-06-05 NOTE — TOC Transition Note (Signed)
Transition of Care [facility]) - Discharge Note   Patient Details  Name: Mathew [name] MRN: [id number] Date of Birth: 02-11-1961  Transition of Care [facility]) CM/SW Contact:  Adrian Prows, RN Phone Number: 06/05/2023, 4:53 PM   Clinical Narrative:    D/C orders received; Norm Parcel at [facility] was been given referral; oxygen travel tank will be delivered to room before pt discharge; pt's wife Dearrius Hartford says family will provide transportation home; she confirmed [facility] is not needed for transport; both agencies will contact pt's wife for coordination of services; no TOC needs.   Final next level of care: Home w Hospice Care Barriers to Discharge: No Barriers Identified   Patient Goals and CMS Choice Patient states their goals for this hospitalization and ongoing recovery are:: pt's dtr Hayden Gera says pt will return home CMS Medicare.gov Compare Post Acute Care list provided to:: Patient Represenative (must comment) Darrel Reach (dtr))   [place] ownership interest in [facility].provided to:: Adult Children    Discharge Placement                       Discharge Plan and Services Additional resources added to the After Visit Summary for     Discharge Planning Services: CM Consult Post Acute Care Choice: Durable Medical Equipment          DME Arranged: Oxygen DME Agency: [employer] Date DME Agency Contacted: 06/05/23 Time DME Agency Contacted: 1426 Representative spoke with at DME Agency: Vaughan Basta            Social Drivers of Health (SDOH) Interventions SDOH Screenings   Food Insecurity: No Food Insecurity (06/05/2023)  Housing: Low Risk  (06/05/2023)  Transportation Needs: No Transportation Needs (06/05/2023)  Utilities: Not At Risk (06/05/2023)  Alcohol Screen: Low Risk  (03/19/2023)  Depression (PHQ2-9): Low Risk  (03/19/2023)  Financial Resource Strain: Low Risk  (03/17/2022)   [facility] from [facility], [facility]  Tobacco Use: Medium Risk (06/03/2023)     Readmission Risk Interventions    06/05/2023    2:22 PM 03/06/2023    1:11 PM  Readmission Risk Prevention Plan  Post Dischage Appt  Complete  Medication Screening  Complete  Transportation Screening Complete Complete  PCP or Specialist Appt within 3-5 Days Complete   HRI or Home Care Consult Complete   Social Work Consult for Recovery Care Planning/Counseling Complete   Palliative Care Screening Complete   Medication Review Oceanographer) Complete

## 2023-06-05 NOTE — TOC Initial Note (Signed)
Transition of Care [facility]) - Initial/Assessment Note    Patient Details  Name: Mathew [name] MRN: [id number] Date of Birth: 07-Apr-1961  Transition of Care [facility]) CM/SW Contact:    Adrian Prows, RN Phone Number: 06/05/2023, 2:29 PM  Clinical Narrative:                 TOC for d/c planning; pt asleep; spoke w/ dtr Darrel Reach; she says pt lives at home w/ spouse Antravious Dhingra [phone]); family plans for him to return at d/c; verified pt has insurance/PCP; she denies pt experiencing SDOH risks; his will will provide transportation home; she says pt does not have DME, HH services, or home oxygen; pt's dtr agrees for pt to receive home oxygen; she does not have an agency preference; spoke w/ Vaughan Basta at [place]; he says agency can provide service, and a travel tank will be delivered to pt's room before d/c; Vaughan Basta was given pt's wife phone # to coordinate delivery of equipment; agency contact info placed in follow up provider section of d/c instructions; pt's dtr notified; TOC is following.  Expected Discharge Plan: Home/Self Care Barriers to Discharge: Continued Medical Work up   Patient Goals and CMS Choice Patient states their goals for this hospitalization and ongoing recovery are:: pt's dtr Jamen Abbondanza says pt will return home CMS Medicare.gov Compare Post Acute Care list provided to:: Patient Represenative (must comment) Darrel Reach (dtr))   [place] ownership interest in [facility].provided to:: Adult Children    Expected Discharge Plan and Services   Discharge Planning Services: CM Consult Post Acute Care Choice: Durable Medical Equipment Living arrangements for the past 2 months: Single Family Home                 DME Arranged: Oxygen DME Agency: [employer] Date DME Agency Contacted: 06/05/23 Time DME Agency Contacted: 1426 Representative spoke with at DME Agency: Vaughan Basta            Prior Living  Arrangements/Services Living arrangements for the past 2 months: Single Family Home Lives with:: Spouse Patient language and need for interpreter reviewed:: Yes Do you feel safe going back to the place where you live?: Yes      Need for Family Participation in Patient Care: Yes (Comment) Care giver support system in place?: Yes (comment) Current home services:  (n/a) Criminal Activity/Legal Involvement Pertinent to Current Situation/Hospitalization: No - Comment as needed  Activities of Daily Living      Permission Sought/Granted Permission sought to share information with : Case Manager Permission granted to share information with : Yes, Verbal Permission Granted  Share Information with NAME: Case Manager     Permission granted to share info w Relationship: Kaizen Brutus (spouse) [phone]     Emotional Assessment Appearance:: Appears stated age Attitude/Demeanor/Rapport: Unable to Assess Affect (typically observed): Unable to Assess Orientation: :  (unable to assess) Alcohol / Substance Use: Not Applicable Psych Involvement: No (comment)  Admission diagnosis:  Anasarca [R60.1] Generalized abdominal pain [R10.84] Cancer associated pain [G89.3] Other closed fracture of thoracic vertebra, unspecified thoracic vertebral level, initial encounter [facility]) [S22.008A] Patient Active Problem List   Diagnosis Date Noted   Medication management 06/05/2023   Anasarca 06/04/2023   Malignant neoplasm of right lung (HCC) 06/04/2023   Malignant neoplasm metastatic to bone (HCC) 06/04/2023   High risk medication use 06/04/2023   Counseling and coordination of care 06/04/2023   Palliative care encounter 06/04/2023   Cancer associated pain 06/03/2023  Acute respiratory failure with hypoxia (HCC) 06/03/2023   Goals of care, counseling/discussion 04/07/2023   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   Malignant neoplasm of prostate (HCC) 08/26/2022   Port-A-Cath in place 05/01/2022   At high risk  for infection due to neutropenia 03/13/2022   Prevention of chemotherapy-induced neutropenia 03/13/2022   Hypokalemia 02/24/2022   Hypoglycemia 02/24/2022   Thrombocytopenia (HCC) 02/24/2022   Anemia 02/24/2022   Fever 02/24/2022   Hypertension 04/29/2021   Encounter for antineoplastic chemotherapy 03/29/2021   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   Chest pain 02/01/2016   S/P thoracotomy 01/23/2016   Right lower lobe lung mass 12/11/2015   Clubbing of nails    PCP:  [name], MD Pharmacy:   [id number] [place], [place] - [address] [place] [address] Phone: [phone] Fax: [phone]  [facility] [address] [place] [address] Phone: [phone] Fax: [phone]     Social Drivers of Health (SDOH) Social History: SDOH Screenings   Food Insecurity: No Food Insecurity (06/05/2023)  Housing: Low Risk  (06/05/2023)  Transportation Needs: No Transportation Needs (06/05/2023)  Utilities: Not At Risk (06/05/2023)  Alcohol Screen: Low Risk  (03/19/2023)  Depression (PHQ2-9): Low Risk  (03/19/2023)  Financial Resource Strain: Low Risk  (03/17/2022)   [facility], [facility]  Tobacco Use: Medium Risk (06/03/2023)   SDOH Interventions: Food Insecurity Interventions: Intervention Not Indicated, Inpatient TOC Housing Interventions: Intervention Not Indicated, Inpatient TOC Transportation Interventions: Intervention Not Indicated, Inpatient TOC Utilities Interventions: Intervention Not Indicated, Inpatient TOC   Readmission Risk Interventions    06/05/2023    2:22 PM 03/06/2023    1:11 PM  Readmission Risk Prevention Plan  Post Dischage Appt  Complete  Medication Screening  Complete  Transportation Screening Complete Complete  PCP or Specialist Appt within 3-5 Days Complete   HRI or Home Care Consult Complete   Social Work  Consult for Recovery Care Planning/Counseling Complete   Palliative Care Screening Complete   Medication Review Oceanographer) Complete

## 2023-06-05 NOTE — TOC Progression Note (Addendum)
Transition of Care [facility]) - Progression Note    Patient Details  Name: Mathew [name] MRN: [id number] Date of Birth: [birth year]  Transition of Care [facility]) CM/SW Contact  Mathew Prows, RN Phone Number: 06/05/2023, 4:11 PM  Clinical Narrative:    [facility] consulted for home hospice; pt's dtr Mathew [name] given list of home hospice providers; she says she needs to talk w/ her mother; Mathew [name] at [place] [facility] notified pt will d/c today; he will have travel tank delivered to room; awaiting family's choice of agencies; will place referral at that time.  -1622- spoke w/ pt's wife Mathew [name]; she has selected home hospice w/ [facility]; LVM for [employer], Hospital Liaison;for referral; awaiting return call.  -1627- called [facility]; spoke w/ Mathew [name]; she will send information to [facility]; she also says pt will need transport by PTAR  -1635- spoke w/ Mathew [name] at [facility]; she was given referral and will start approval process; she will contact pt's wife; Mathew [name] says to continue home oxygen per [employer], and arrange transport by PTAR.  -1644- spoke w/ pt's wife; she confirmed d/c address [address] [place], [place] [address]; Mathew [name] also says family will transport pt home, and no PTAR needed; explained travel tank needs to be delivered to room before pt d/c's Mathew [name] at [place] and Mathew [name] at [facility] [place] notified of d/c address. Expected Discharge Plan: Home/Self Care Barriers to Discharge: Continued Medical Work up  Expected Discharge Plan and Services   Discharge Planning Services: CM Consult Post Acute Care Choice: Durable Medical Equipment Living arrangements for the past 2 months: Single Family Home Expected Discharge Date: 06/05/23               DME Arranged: Oxygen DME Agency: [employer] Date DME Agency Contacted: 06/05/23 Time DME Agency Contacted: 1426 Representative spoke with at DME Agency:  Mathew [name]             Social Determinants of Health (SDOH) Interventions SDOH Screenings   Food Insecurity: No Food Insecurity (06/05/2023)  Housing: Low Risk  (06/05/2023)  Transportation Needs: No Transportation Needs (06/05/2023)  Utilities: Not At Risk (06/05/2023)  Alcohol Screen: Low Risk  (03/19/2023)  Depression (PHQ2-9): Low Risk  (03/19/2023)  Financial Resource Strain: Low Risk  (03/17/2022)   [facility]  Tobacco Use: Medium Risk (06/03/2023)    Readmission Risk Interventions    06/05/2023    2:22 PM 03/06/2023    1:11 PM  Readmission Risk Prevention Plan  Post Dischage Appt  Complete  Medication Screening  Complete  Transportation Screening Complete Complete  PCP or Specialist Appt within 3-5 Days Complete   HRI or Home Care Consult Complete   Social Work Consult for Recovery Care Planning/Counseling Complete   Palliative Care Screening Complete   Medication Review Oceanographer) Complete

## 2023-06-05 NOTE — Plan of Care (Signed)
  Problem: Education: Goal: Knowledge of General Education information will improve Description: Including pain rating scale, medication(s)/side effects and non-pharmacologic comfort measures Outcome: Progressing   Problem: Health Behavior/Discharge Planning: Goal: Ability to manage health-related needs will improve Outcome: Progressing   Problem: Clinical Measurements: Goal: Ability to maintain clinical measurements within normal limits will improve Outcome: Progressing Goal: Will remain free from infection Outcome: Progressing Goal: Diagnostic test results will improve Outcome: Progressing Goal: Respiratory complications will improve Outcome: Progressing Goal: Cardiovascular complication will be avoided Outcome: Progressing   Problem: Nutrition: Goal: Adequate nutrition will be maintained Outcome: Progressing   Problem: Coping: Goal: Level of anxiety will decrease Outcome: Progressing   Problem: Elimination: Goal: Will not experience complications related to bowel motility Outcome: Progressing Goal: Will not experience complications related to urinary retention Outcome: Progressing   Problem: Pain Management: Goal: General experience of comfort will improve Outcome: Progressing   Problem: Safety: Goal: Ability to remain free from injury will improve Outcome: Progressing   Problem: Skin Integrity: Goal: Risk for impaired skin integrity will decrease Outcome: Progressing   Problem: Activity: Goal: Risk for activity intolerance will decrease Outcome: Not Progressing   Cindy S. Clelia Croft BSN, RN, [employer], CCRN 06/05/2023 3:22 AM

## 2023-06-05 NOTE — Progress Notes (Signed)
Daily Progress Note   Patient Name: Mathew [name]       Date: 06/05/2023 DOB: 11/19/1960  Age: 62 y.o. MRN#: [id number] Attending Physician: Alba Cory, MD Primary Care Physician: [name], MD Admit Date: 06/03/2023 Length of Stay: 2 days  Reason for Consultation/Follow-up: Establishing goals of care  Subjective:   CC: Patient notes pain current improved.  Following up regarding complex medical decision making and symptom management.  Subjective:  Reviewed EMR prior to presenting to bedside.  Patient has resumed home medications for pain management.  Patient required as needed dose of oxycodone 15 mg x 2 in past 24 hours.  Discussed care with RN for medical updates. Also able to discuss care with hospitalist.   Presented to bedside to meet with patient.  Patient laying comfortably in bed.  Patient's daughter and son-in-law present at bedside.  Again introduced myself as a member of the palliative medicine team.  Able to follow-up on patient's symptom burden at this time.  Patient feels that his symptoms are currently managed and he wants to continue with his home pain regimen. Denies adverse effects. Patient feels that the as needed dose of oxycodone helps for breakthrough pain as needed. Patient planning to discuss care plan with Dr. Shirline Frees later today.  Continuing appropriate medical interventions.  All questions answered at that time.  Noted palliative medicine would be available if needed for further symptom.  Thank patient and family for allowing me to visit with them to.  Review of Systems Pain improved Objective:   Vital Signs:  BP 112/60   Pulse 96   Temp 98 F (36.7 C) (Axillary)   Resp (!) 23   Ht 5\' 9"  (1.753 m)   Wt 70.6 kg   SpO2 96%   BMI 22.98 kg/m   Physical Exam: General: NAD, alert, chronically ill-appearing, laying in bed Cardiovascular: RRR Respiratory: no increased work of breathing noted, not in respiratory distress Neuro:  A&Ox4, following commands easily Psych: appropriately answers all questions   Imaging: I personally reviewed recent imaging.   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  Upon presentation to the ER, patient receiving regiment for cancer associated pain and hypotension.  Imaging concerning for findings of peritoneal carcinomatosis, acute T9 and 10 pathologic fractures,and progression of the lower mediastinal soft tissue mass.  Oncology consulted for recommendations.  Palliative medicine team consulted to assist with complex medical decision making.   Recommendations/Plan: # Complex medical decision making/goals of care:           -Patient and family have already discussed with providers that they would be willing for patient to receive medical management in the ICU including pressor support.  Wanting continue appropriate medical interventions at this time.  Patient has stated DNR/DNI status. Discussing planning with oncology as well.                 -  Code Status: Limited: Do not attempt resuscitation (DNR) -DNR-LIMITED -Do Not Intubate/DNI     # Symptom management                -Pain, acute on chronic in  setting of metastatic lung cancer with disease progression seen on imaging as well as pathologic fracture of T9/T10 Patient has chronic pain management doctor, Dr. Gwendel Hanson, for follow up.                                Continue home medications of:                                              -Oxycodone 15 mg every 6 hours as needed                                              -Lyrica 200 mg 3  times daily                                              -Gabapentin 800 mg nightly                                              -Mesalamine 100 mg suppository nightly                                              -Nifedipine 0.2%/hydrocortisone 2.5% cream twice daily (wife to provide if unavailable through pharmacy)                               -Unfortunately pharmacy does not carry patient's home medication of Xtampza 27mg  BID so will instead continue OxyContin 30mg  BID. Discharge back on home med of Xtampza.                               -Discontinue IV fentanyl    # Psycho-social/Spiritual Support:  - Support System: wife, daughter, son-in-law   # Discharge Planning:  Home; plans follow up with chronic pain management provider Dr. Gwendel Hanson  Discussed with: patient, RN, hospitalist, patient's daughter and son-in-law  Thank you for allowing the palliative care team to participate in the care Mathew [name]. Will plan to follow up as needed since goals for medical care currently determined and pain management. Please reach out if acute PMT needs arise. Thank you.   Alvester Morin, DO Palliative Care Provider PMT # [phone]  If patient remains symptomatic despite maximum doses, please call PMT at [phone] between 0700 and 1900. Outside of these hours, please call attending, as PMT does not have night coverage.  *Please note that this is a verbal dictation therefore any spelling or grammatical errors are due to the "Dragon Medical One" system interpretation.

## 2023-06-07 ENCOUNTER — Other Ambulatory Visit: Payer: Self-pay

## 2023-06-07 LAB — TYPE AND SCREEN
ABO/RH(D): O POS
Antibody Screen: NEGATIVE
Unit division: 0

## 2023-06-07 LAB — BPAM RBC
Blood Product Expiration Date: 202501172359
ISSUE DATE / TIME: 202412201459
Unit Type and Rh: 5100

## 2023-06-08 ENCOUNTER — Other Ambulatory Visit: Payer: BC Managed Care – PPO

## 2023-06-09 ENCOUNTER — Other Ambulatory Visit: Payer: BC Managed Care – PPO

## 2023-06-12 NOTE — Progress Notes (Deleted)
[facility] OFFICE PROGRESS NOTE  [name], MD 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 Dorris Fetch. 2) Sunitinib 3 7.5 mg p.o. daily.  First dose started April 08, 2021.  Status post 3 months of treatment.  This treatment was discontinued secondary to disease progression. 3) Votrient (pazopanib) 800 mg p.o. daily.  Started August 10, 2021.  Status post 2 months of treatment discontinued secondary to disease progression. 4) status post redo right thoracotomy with resection of pleural tumors and lymph node dissection under the care of Dr. Dorris Fetch on Nov 13, 2021. 5) Systemic chemotherapy with doxorubicin 25 Mg/M2, ifosfamide 2500 Mg/M2 and mesna 2500 Mg/M2 days 1-3 every 3 weeks.  First dose February 12, 2022 at [facility] cancer center.  Status post 2 cycles of treatment discontinued secondary to disease progression. 6) Radiation to the prostate cancer, under the care of Dr. Kathrynn Running. Last day of radiation on 12/17/22 7) Systemic chemotherapy with Temodar 200 Mg/M2 on days 1-7 and day 15-21 with a Avastin 5 Mg/KG on days 8 and 22 every 4 weeks.  Status post 11 cycles . Discontinued due to disease progression 8) Palliative systemic chemotherapy with gemcitabine 900 mg/m on days 1 and 8 and docetaxel 75 mg/m on day 8 IV every 3 weeks. He will receive Neulasta support on day 9. First dose on 04/07/23. Status post 3 cycles.   CURRENT THERAPY: Hospice  INTERVAL HISTORY: LEWELLYN PINAL 62 y.o. male returns to the clinic today for a follow up visit. The patient was last seen in the clinic on 05/25/23. In the interval  since last being seen, he presented to the ER for the chief complaint of abdominal pain. CT abdomen and pelvis showed 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, new pathologic fracture T9-T10, left hepatic mass stable from previous, increasing lower mediastinal soft tissue mass, diffuse body wall edema. Patient had low blood pressure and was started on midodrine. CT chest showed significantly progression of Disease. The patient was discharged on hospice.   Since last being seen, he is on supplemental oxygen. Swelling. He denies fevers, chills, or night sweats. Cough***. For pain, he takes  ***. He denies any more nausea, vomiting, or ***. Diarrhea or constipation? Ascites. He is here for evaluation and a more detailed discussion about his current condition and treatment options.     MEDICAL HISTORY: Past Medical History:  Diagnosis Date   Anemia    Clubbing of nails    Deafness in left ear    Encounter for antineoplastic chemotherapy    History of kidney stones    Hypertension    Hypoglycemia due to neoplasm    Malignant neoplasm prostate [facility]) 07/2022   urologist--- dr pace;   bx 01/ 2024, Gleason 4+3   Malignant solitary fibrous neoplasm (HCC) 11/2015   oncologist--- @ [facility] Dr R. 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  MEDICATIONS:  Current Outpatient Medications  Medication Sig Dispense Refill   albuterol (VENTOLIN HFA) 108 (90 Base) MCG/ACT inhaler Inhale 1-2 puffs into the lungs every 6 (six) hours as needed for shortness of breath  or wheezing.     azithromycin (ZITHROMAX) 250 MG tablet Take 1 tablet (250 mg total) by mouth daily. 4 each 0   dexamethasone (DECADRON) 4 MG tablet Take 2 tabs by mouth 2 times daily starting day before chemo. Then take 2 tabs daily for 2 days starting day after chemo. Take with food. (Patient taking differently: Take 4 mg by mouth See admin instructions. Take 2 tabs by mouth 2 times daily starting day before chemo. Then take 2 tabs daily for 2 days starting day after chemo. Take with food.) 30 tablet 1   docusate sodium (COLACE) 100 MG capsule Take 1 capsule (100 mg total) by mouth every 12 (twelve) hours. (Patient taking differently: Take 100 mg by mouth daily.) 60 capsule 0   folic acid (FOLVITE) 400 MCG tablet Take 400 mcg by mouth daily.     furosemide (LASIX) 20 MG tablet TAKE 1 TABLET BY MOUTH EVERY DAY AS NEEDED (Patient taking differently: Take 20 mg by mouth daily as needed for fluid.) 10 tablet 0   gabapentin (NEURONTIN) 400 MG capsule Take 800 mg by mouth daily.     mesalamine (CANASA) 1000 MG suppository Place 1 suppository (1,000 mg total) rectally at bedtime. 30 suppository 12   midodrine (PROAMATINE) 5 MG tablet Take 1 tablet (5 mg total) by mouth 3 (three) times daily with meals. 90 tablet 0   Multiple Vitamin (MULTIVITAMIN) capsule Take 1 capsule by mouth daily.     ondansetron (ZOFRAN) 8 MG tablet Take 1 tablet (8 mg total) by mouth every 8 (eight) hours as needed for nausea or vomiting. 30 tablet 1   oxyCODONE (ROXICODONE) 15 MG immediate release tablet Take 1 tablet (15 mg total) by mouth every 6 (six) hours as needed for pain. 60 tablet 0   polyethylene glycol (MIRALAX / GLYCOLAX) 17 g packet Take 17 g by mouth 2 (two) times daily.     pregabalin (LYRICA) 200 MG capsule Take 200 mg by mouth in the morning, at noon, and at bedtime.     tamsulosin (FLOMAX) 0.4 MG CAPS capsule Take 0.4 mg by mouth at bedtime. Urology started 8/27     XTAMPZA ER 27 MG C12A Take 1 capsule by mouth 2  (two) times daily.     No current facility-administered medications for this visit.   Facility-Administered Medications Ordered in Other Visits  Medication Dose Route Frequency Provider Last Rate Last Admin   sodium phosphate (FLEET) 7-19 GM/118ML enema 1 enema  1 enema Rectal Once Noel Christmas, MD       sodium phosphate (FLEET) 7-19 GM/118ML enema 1 enema  1 enema Rectal Once Noel Christmas, MD        SURGICAL HISTORY:  Past Surgical History:  Procedure Laterality Date   GOLD SEED IMPLANT N/A 10/21/2022   Procedure: GOLD SEED IMPLANT;  Surgeon: Noel Christmas, MD;  Location: [facility];  Service: Urology;  Laterality: N/A;   INTERCOSTAL NERVE BLOCK Right 04/27/2020  Procedure: INTERCOSTAL NERVE BLOCK;  Surgeon: Loreli Slot, MD;  Location: [facility] OR;  Service: Thoracic;  Laterality: Right;   KNEE CARTILAGE SURGERY Left 1976   RESECTION OF MEDIASTINAL MASS Right 01/23/2016   @[facility]  by dr Dorris Fetch;   resection chest wall mass w/ en bloc wedge resection of RLL   RESECTION OF MEDIASTINAL MASS Right 11/13/2021   Procedure: RESECTION OF PLEURAL TUMORS;  Surgeon: Loreli Slot, MD;  Location: [facility] OR;  Service: Thoracic;  Laterality: Right;   SPACE OAR INSTILLATION N/A 10/21/2022   Procedure: SPACE OAR INSTILLATION;  Surgeon: Noel Christmas, MD;  Location: [facility];  Service: Urology;  Laterality: N/A;   THORACOTOMY Right 04/27/2020   Procedure: THORACOTOMY;  Surgeon: Loreli Slot, MD;  Location: [facility] OR;  Service: Thoracic;  Laterality: Right;   THORACOTOMY Right 11/13/2021   Procedure: REDO THORACOTOMY;  Surgeon: Loreli Slot, MD;  Location[facility] OR;  Service: Thoracic;  Laterality: Right;   TONSILLECTOMY     child   VIDEO BRONCHOSCOPY WITH ENDOBRONCHIAL ULTRASOUND N/A 12/17/2015   Procedure: VIDEO BRONCHOSCOPY WITH ENDOBRONCHIAL ULTRASOUND;  Surgeon: Loreli Slot, MD;  Location: [facility] OR;  Service: Thoracic;   Laterality: N/A;    REVIEW OF SYSTEMS:   Review of Systems  Constitutional: Negative for appetite change, chills, fatigue, fever and unexpected weight change.  HENT:   Negative for mouth sores, nosebleeds, sore throat and trouble swallowing.   Eyes: Negative for eye problems and icterus.  Respiratory: Negative for cough, hemoptysis, shortness of breath and wheezing.   Cardiovascular: Negative for chest pain and leg swelling.  Gastrointestinal: Negative for abdominal pain, constipation, diarrhea, nausea and vomiting.  Genitourinary: Negative for bladder incontinence, difficulty urinating, dysuria, frequency and hematuria.   Musculoskeletal: Negative for back pain, gait problem, neck pain and neck stiffness.  Skin: Negative for itching and rash.  Neurological: Negative for dizziness, extremity weakness, gait problem, headaches, light-headedness and seizures.  Hematological: Negative for adenopathy. Does not bruise/bleed easily.  Psychiatric/Behavioral: Negative for confusion, depression and sleep disturbance. The patient is not nervous/anxious.     PHYSICAL EXAMINATION:  There were no vitals taken for this visit.  ECOG PERFORMANCE STATUS: {CHL ONC ECOG [id number]  Physical Exam  Constitutional: Oriented to person, place, and time and well-developed, well-nourished, and in no distress. No distress.  HENT:  Head: Normocephalic and atraumatic.  Mouth/Throat: Oropharynx is clear and moist. No oropharyngeal exudate.  Eyes: Conjunctivae are normal. Right eye exhibits no discharge. Left eye exhibits no discharge. No scleral icterus.  Neck: Normal range of motion. Neck supple.  Cardiovascular: Normal rate, regular rhythm, normal heart sounds and intact distal pulses.   Pulmonary/Chest: Effort normal and breath sounds normal. No respiratory distress. No wheezes. No rales.  Abdominal: Soft. Bowel sounds are normal. Exhibits no distension and no mass. There is no tenderness.   Musculoskeletal: Normal range of motion. Exhibits no edema.  Lymphadenopathy:    No cervical adenopathy.  Neurological: Alert and oriented to person, place, and time. Exhibits normal muscle tone. Gait normal. Coordination normal.  Skin: Skin is warm and dry. No rash noted. Not diaphoretic. No erythema. No pallor.  Psychiatric: Mood, memory and judgment normal.  Vitals reviewed.  LABORATORY DATA: Lab Results  Component Value Date   WBC 9.7 06/05/2023   HGB 7.3 (L) 06/05/2023   HCT 26.1 (L) 06/05/2023   MCV 95.3 06/05/2023   PLT 190 06/05/2023      Chemistry      Component Value  Date/Time   NA 139 06/05/2023 0303   K 4.1 06/05/2023 0303   CL 102 06/05/2023 0303   CO2 29 06/05/2023 0303   BUN 22 06/05/2023 0303   CREATININE 0.52 (L) 06/05/2023 0303   CREATININE 0.74 06/02/2023 0926      Component Value Date/Time   CALCIUM 8.6 (L) 06/05/2023 0303   ALKPHOS 131 (H) 06/04/2023 0500   AST 20 06/04/2023 0500   AST 15 06/02/2023 0926   ALT 12 06/04/2023 0500   ALT 8 06/02/2023 0926   BILITOT 0.6 06/04/2023 0500   BILITOT 0.4 06/02/2023 0926       RADIOGRAPHIC STUDIES:  CT CHEST W CONTRAST Result Date: 06/05/2023 CLINICAL DATA:  Pulmonary nodule. 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 * Tracking Code: BO * EXAM: CT CHEST WITH CONTRAST TECHNIQUE: Multidetector CT imaging of the chest was performed during intravenous contrast administration. RADIATION DOSE REDUCTION: This exam was performed according to the departmental dose-optimization program which includes automated exposure control, adjustment of the mA and/or kV according to patient size and/or use of iterative reconstruction technique. CONTRAST:  75mL OMNIPAQUE IOHEXOL 300 MG/ML  SOLN COMPARISON:  03/20/2023 FINDINGS: Cardiovascular: Mild coronary artery calcification. Global cardiac size within normal limits. Marked enlargement of the posterior  mediastinal metastatic mass now demonstrating significant mass effect upon the base of the heart posteriorly with displacement of the heart antral laterally to the left. Significant mass effect upon the inferior cavoatrial junction and coronary sinus which is not well evaluated due to the relatively early phase contrast enhancement. No pericardial effusion. Central pulmonary arteries are of normal caliber. Thoracic aorta is unremarkable. Right internal jugular chest port tip seen at the superior right atrium. Mediastinum/Nodes: Visualized thyroid is unremarkable. The right 3 vascular mass has markedly increased in size now measuring 6.0 x 6.8 cm at axial image # 62/2, previously measuring 3.3 x 4.0 cm when measured in similar fashion. Additional malignant masses within the anterior mediastinum have uniformly increased in size and become more confluent within the right cardiophrenic angle. As noted above, the dominant mass within the posterior mediastinum has markedly increased in size now measuring 7.3 x 12.0 cm, previously measuring roughly 3.6 x 4.6 cm. Lungs/Pleura: Confluent pleural in chest wall masses within the inferior right hemithorax have all uniformly increased in size and there is now near complete collapse of the right lower lobe and compressive atelectasis of the right upper lobe. Small right loculated pleural effusion noted. Left lung is clear. No pneumothorax. No central obstructing lesion. Upper Abdomen: Ascites has developed throughout the visualized upper abdomen. Numerous malignant masses within the right chest wall and pleural space demonstrate increasing mass effect upon the liver which is displaced medially. Musculoskeletal: No acute bone abnormality. Malignant destruction of the right ninth and tenth ribs laterally has developed, new since prior examination. Extension of the extensive malignant disease into the right chest wall again noted. Increasing diffuse subcutaneous body wall edema.  IMPRESSION: 1. Marked interval progression of disease. 2. Marked enlargement of the posterior mediastinal metastatic mass now demonstrating significant mass effect upon the base of the heart posteriorly with displacement of the heart anterolaterally to the left. Significant mass effect upon the inferior cavoatrial junction and coronary sinus which is not well evaluated due to the relatively early phase contrast enhancement. 3. Marked interval increase in size of the confluent pleural and chest wall masses within the inferior right hemithorax with now near complete collapse of the  right lower lobe and compressive atelectasis of the right upper lobe. Small right loculated pleural effusion. 4. Interval development of malignant destruction of the right ninth and tenth ribs laterally. 5. Interval development of ascites throughout the visualized upper abdomen. Increasing diffuse subcutaneous body wall edema. 6. Mild coronary artery calcification. Electronically Signed   By: Helyn Numbers M.D.   On: 06/05/2023 02:00   US Abdomen Limited Result Date: 06/04/2023 CLINICAL DATA:  Ascites EXAM: LIMITED ABDOMEN ULTRASOUND FOR ASCITES TECHNIQUE: Limited ultrasound survey for ascites was performed in all four abdominal quadrants. COMPARISON:  CT abdomen pelvis, 06/03/2023 FINDINGS: Small volume ascites throughout the abdomen and pelvis. Maximum pocket depth on provided images 4.8 cm in the right lower quadrant. IMPRESSION: Small volume ascites throughout the abdomen and pelvis. Maximum pocket depth on provided images 4.8 cm in the right lower quadrant. Electronically Signed   By: Jearld Lesch M.D.   On: 06/04/2023 11:34   DG CHEST PORT 1 VIEW Result Date: 06/03/2023 CLINICAL DATA:  Hypoxia EXAM: PORTABLE CHEST 1 VIEW COMPARISON:  Chest x-ray 02/23/2022.  CT chest 03/20/2023. FINDINGS: There is new right lateral pleural thickening or loculated fluid. Right basilar opacities appear similar to prior. The cardiomediastinal  silhouette is stable. Left lung is clear. There is no pneumothorax. Right chest port catheter tip ends in the distal SVC. No acute fractures are identified. IMPRESSION: 1. New right lateral pleural thickening or loculated fluid. 2. Right basilar opacities appear similar to prior. Electronically Signed   By: Darliss Cheney M.D.   On: 06/03/2023 22:57   CT ABDOMEN PELVIS W CONTRAST Result Date: 06/03/2023 CLINICAL DATA:  Acute abdominal pain. Undergoing chemotherapy. Prostate cancer. EXAM: CT ABDOMEN AND PELVIS WITH CONTRAST TECHNIQUE: Multidetector CT imaging of the abdomen and pelvis was performed using the standard protocol following bolus administration of intravenous contrast. RADIATION DOSE REDUCTION: This exam was performed according to the departmental dose-optimization program which includes automated exposure control, adjustment of the mA and/or kV according to patient size and/or use of iterative reconstruction technique. CONTRAST:  OMNIPAQUE IOHEXOL 300 MG/ML  SOLN COMPARISON:  CT chest abdomen and pelvis 03/20/2023 FINDINGS: Lower chest: Multiple soft tissue tumors occupying the lower right hemithorax extending through the diaphragm. Largest measures 8.2 x 11.9 cm image 2/16. This is not significantly changed. There is soft tissue mass in the lower mediastinum the right of the esophagus at the level of the diaphragm measuring 7.9 x 11.6 cm which has significantly increased in size (previously 3.8 by 4.4 cm). Hepatobiliary: There is a stable left hepatic mass measuring 2.9 cm. No definite new hepatic masses are identified. No biliary ductal dilatation. The gallbladder is within normal limits. Pancreas: Unremarkable. No pancreatic ductal dilatation or surrounding inflammatory changes. Spleen: Normal in size without focal abnormality. Adrenals/Urinary Tract: There are 2 cysts in the right kidney measuring up to 2.6 cm. There are punctate nonobstructing left renal calculi. Cortical hypodensities in  the left kidney are too small to characterize, also likely cysts. No hydronephrosis identified. The right adrenal gland is not well defined. The left adrenal gland is within normal limits. Bladder is within normal limits. Stomach/Bowel: Stomach is within normal limits. Appendix appears normal. No evidence of bowel wall thickening, distention, or inflammatory changes. Vascular/Lymphatic: No significant vascular findings are present. No enlarged abdominal or pelvic lymph nodes. Reproductive: Prostate radiotherapy seeds are present. Other: There is new small volume ascites throughout the abdomen and pelvis with some smooth peritoneal enhancement in the right paracolic gutter and pelvis.  There is diffuse body wall edema which is new from prior. Musculoskeletal: There are new pathologic fractures of the lateral aspects of T9 and T10. Multilevel degenerative changes affect the spine. IMPRESSION: 1. New small volume ascites throughout the abdomen and pelvis with some smooth peritoneal enhancement in the right paracolic gutter and pelvis. 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 2. New pathologic fractures of the lateral aspects of T9 and T10. 3. Multiple soft tissue tumors occupying the lower right hemithorax extending through the diaphragm, grossly unchanged, but incompletely evaluated. 4. Stable left hepatic mass. 5. Lower mediastinal soft tissue mass has significantly increased in size. 6. New diffuse body wall edema. 7. Nonobstructing left renal calculi. Electronically Signed   By: Darliss Cheney M.D.   On: 06/03/2023 20:12     ASSESSMENT/PLAN:  This is a very pleasant 62 year old male with  1) recurrent solitary fibrous tumor of the right lung.  This was initially diagnosed in June 2017.  2) prostate adenocarcinoma with Gleason score of 7 (4+3) diagnosed in February 2024 followed by urology.   He is status post resection.  He had recurrence in October 2021 and underwent re-resection under the care  of Dr. Dorris Fetch.  He had been on observation as there is no role for adjuvant therapy for his condition.  He was seen by Dr. Lin Givens at [facility] who recommended observation and consideration of treatment with Sunitinib followed by surgery if the patient had disease recurrence.    The patient had a new 1.4 cm nodule posteriorly and the patient underwent treatment with sunitinib 37.5 mg p.o. daily.  He was on this for 3 months. He had been tolerating this fairly well except for hypertension.  Unfortunately scan showed evidence of disease progression with interval development of multiple new pleural-based nodules in the right hemithorax measuring up to 2.9 cm which is concerning for recurrent/metastatic disease.   He was then seen by Dr. Dorris Fetch who offered surgical resection.  Dr. Lin Givens recommended neoadjuvant treatment with pazopanib for 8 weeks followed by surgical resection if he had improvement in his disease followed by continued adjuvant treatment for another 6 to 8 months.  Unfortunately, after 2 months of treatment the CT scan showed clear evidence of disease progression with enlarging right pleural-based masses.  He was referred to Dr. 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  Molecular studies by foundation 1 and PD-L1 did not show any actionable mutations and his PD-L1 expression was negative.    The patient then saw Dr. Waymon Amato at [facility] who recommended inpatient systemic chemotherapy with doxorubicin, ifosfamide, and mesna IV every 3 weeks.  He had a 2D echo as well as a Port-A-Cath placed.  This was discontinued due to evidence of disease progression.    Dr. Waymon Amato recommended for the patient treatment with Temodar 200 Mg/M2  on days 1-7 and 15-21 in addition to Avastin 5 Mg/M2 on days 8 and 22 every 4 weeks.  He is status post 11 cycles.   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 His avastin can cause delayed wound healing. Additionally, he was found to have disease progression so treatment was discontinued. Dr. Arbutus Ped spoke with Dr. Salomon Fick from [facility].  Dr. Salomon Fick is in agreement with the patient's current regimen, but of course, the concern for infection  is there with his fistula.  They do not have any phase 1 clinical trials at this time.  If he had progression in the future she would recommend possibly dacarbazine or Doxil.    He saw Dr. Cherylann Ratel who recommended gemcitabine 900 mg/m IV on days 1 and 8 and docetaxel 75 mg/m on day 8 with Neulasta support on day 9. For outpatient premedications, he recommenced dexamethasone 8 mg p.o. twice a day the day before and the day after day 8 of chemotherapy. He is status post 3 cycles.   He was recently hospitalized and was found to have significant disease progression.   The patient was seen with Dr. Arbutus Ped. He agrees with hospice. It is unlikely any future treatment will be of any value.   The patient is in agreement with this.  We will not arrange any follow up appointments. We would be happy to see this patient on an as needed basis.   The patient was advised to call immediately if she has any concerning symptoms in the interval. The patient voices understanding of current disease status and treatment options and is in agreement with the current care plan. All questions were answered. The patient knows to call the clinic with any problems, questions or concerns. We can certainly see the patient much sooner if necessary         No orders of the defined types were placed in this encounter.    I spent {CHL ONC TIME VISIT - FUXNA:[id number]} counseling the patient face to face. The total time spent in the appointment was {CHL ONC TIME VISIT -  KYHCW:[id number]}.  Kore Madlock L Que Meneely, PA-C 06/12/23

## 2023-06-15 ENCOUNTER — Inpatient Hospital Stay: Payer: BC Managed Care – PPO

## 2023-06-15 ENCOUNTER — Ambulatory Visit: Payer: BC Managed Care – PPO | Admitting: Physician Assistant

## 2023-06-16 ENCOUNTER — Ambulatory Visit: Payer: BC Managed Care – PPO

## 2023-06-19 ENCOUNTER — Other Ambulatory Visit: Payer: Self-pay | Admitting: Internal Medicine

## 2023-06-19 DIAGNOSIS — D492 Neoplasm of unspecified behavior of bone, soft tissue, and skin: Secondary | ICD-10-CM

## 2023-06-20 ENCOUNTER — Encounter: Payer: Self-pay | Admitting: Internal Medicine

## 2023-06-23 ENCOUNTER — Ambulatory Visit: Payer: BC Managed Care – PPO

## 2023-06-23 ENCOUNTER — Inpatient Hospital Stay: Payer: BC Managed Care – PPO | Admitting: Nurse Practitioner

## 2023-06-23 ENCOUNTER — Inpatient Hospital Stay: Payer: BC Managed Care – PPO

## 2023-06-25 ENCOUNTER — Inpatient Hospital Stay: Payer: BC Managed Care – PPO

## 2023-07-18 DEATH — deceased

## 2024-01-20 NOTE — Telephone Encounter (Signed)
 Mathew [name]

## 2024-03-23 ENCOUNTER — Other Ambulatory Visit: Payer: Self-pay
